# Patient Record
Sex: Male | Born: 1965
Health system: Southern US, Academic
[De-identification: ages and names within clinical notes are randomized; demographics above are authoritative.]

## PROBLEM LIST (undated history)

## (undated) ENCOUNTER — Ambulatory Visit: Payer: PRIVATE HEALTH INSURANCE

## (undated) ENCOUNTER — Telehealth

## (undated) ENCOUNTER — Telehealth: Attending: Oncology | Primary: Oncology

## (undated) ENCOUNTER — Telehealth
Attending: Pharmacist Clinician (PhC)/ Clinical Pharmacy Specialist | Primary: Pharmacist Clinician (PhC)/ Clinical Pharmacy Specialist

## (undated) ENCOUNTER — Encounter: Attending: Hematology & Oncology | Primary: Hematology & Oncology

## (undated) ENCOUNTER — Encounter

## (undated) ENCOUNTER — Encounter: Attending: Oncology | Primary: Oncology

## (undated) ENCOUNTER — Encounter: Attending: Internal Medicine | Primary: Internal Medicine

## (undated) ENCOUNTER — Encounter: Attending: Primary Care | Primary: Primary Care

## (undated) ENCOUNTER — Ambulatory Visit

## (undated) ENCOUNTER — Ambulatory Visit: Payer: PRIVATE HEALTH INSURANCE | Attending: Internal Medicine | Primary: Internal Medicine

## (undated) ENCOUNTER — Encounter: Attending: Nurse Practitioner | Primary: Nurse Practitioner

## (undated) ENCOUNTER — Telehealth: Attending: Hematology & Oncology | Primary: Hematology & Oncology

## (undated) ENCOUNTER — Ambulatory Visit: Payer: MEDICAID

## (undated) ENCOUNTER — Telehealth: Attending: Nurse Practitioner | Primary: Nurse Practitioner

## (undated) ENCOUNTER — Encounter
Attending: Pharmacist Clinician (PhC)/ Clinical Pharmacy Specialist | Primary: Pharmacist Clinician (PhC)/ Clinical Pharmacy Specialist

## (undated) ENCOUNTER — Ambulatory Visit: Payer: PRIVATE HEALTH INSURANCE | Attending: Hematology & Oncology | Primary: Hematology & Oncology

## (undated) ENCOUNTER — Encounter: Attending: Gastroenterology | Primary: Gastroenterology

## (undated) ENCOUNTER — Inpatient Hospital Stay

## (undated) ENCOUNTER — Encounter
Attending: Student in an Organized Health Care Education/Training Program | Primary: Student in an Organized Health Care Education/Training Program

## (undated) ENCOUNTER — Encounter: Attending: Family | Primary: Family

## (undated) ENCOUNTER — Telehealth: Attending: Internal Medicine | Primary: Internal Medicine

## (undated) ENCOUNTER — Ambulatory Visit: Payer: PRIVATE HEALTH INSURANCE | Attending: Ophthalmology | Primary: Ophthalmology

## (undated) ENCOUNTER — Ambulatory Visit: Payer: PRIVATE HEALTH INSURANCE | Attending: Oncology | Primary: Oncology

## (undated) ENCOUNTER — Telehealth
Attending: Student in an Organized Health Care Education/Training Program | Primary: Student in an Organized Health Care Education/Training Program

## (undated) ENCOUNTER — Ambulatory Visit
Payer: PRIVATE HEALTH INSURANCE | Attending: Pharmacist Clinician (PhC)/ Clinical Pharmacy Specialist | Primary: Pharmacist Clinician (PhC)/ Clinical Pharmacy Specialist

## (undated) ENCOUNTER — Inpatient Hospital Stay: Payer: PRIVATE HEALTH INSURANCE

## (undated) DIAGNOSIS — E079 Disorder of thyroid, unspecified: Secondary | ICD-10-CM

## (undated) DIAGNOSIS — K0889 Other specified disorders of teeth and supporting structures: Secondary | ICD-10-CM

## (undated) DIAGNOSIS — Z5111 Encounter for antineoplastic chemotherapy: Secondary | ICD-10-CM

## (undated) DIAGNOSIS — T8609 Other complications of bone marrow transplant: Secondary | ICD-10-CM

## (undated) DIAGNOSIS — I728 Aneurysm of other specified arteries: Secondary | ICD-10-CM

## (undated) DIAGNOSIS — F329 Major depressive disorder, single episode, unspecified: Secondary | ICD-10-CM

## (undated) DIAGNOSIS — R51 Headache: Secondary | ICD-10-CM

## (undated) DIAGNOSIS — C8190 Hodgkin lymphoma, unspecified, unspecified site: Secondary | ICD-10-CM

## (undated) DIAGNOSIS — E119 Type 2 diabetes mellitus without complications: Secondary | ICD-10-CM

## (undated) DIAGNOSIS — D89813 Graft-versus-host disease, unspecified: Secondary | ICD-10-CM

## (undated) DIAGNOSIS — I10 Essential (primary) hypertension: Secondary | ICD-10-CM

## (undated) DIAGNOSIS — C801 Malignant (primary) neoplasm, unspecified: Secondary | ICD-10-CM

## (undated) DIAGNOSIS — F32A Depression, unspecified: Secondary | ICD-10-CM

## (undated) HISTORY — DX: Aneurysm of other specified arteries: I72.8

## (undated) HISTORY — DX: Hodgkin lymphoma, unspecified, unspecified site: C81.90

## (undated) HISTORY — PX: TONSILLECTOMY: SUR1361

## (undated) HISTORY — DX: Other specified disorders of teeth and supporting structures: K08.89

## (undated) HISTORY — DX: Encounter for antineoplastic chemotherapy: Z51.11

## (undated) HISTORY — DX: Malignant (primary) neoplasm, unspecified: C80.1

---
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
## 2012-12-03 ENCOUNTER — Other Ambulatory Visit: Payer: Self-pay | Admitting: Endocrinology

## 2012-12-03 DIAGNOSIS — R221 Localized swelling, mass and lump, neck: Secondary | ICD-10-CM

## 2012-12-06 ENCOUNTER — Ambulatory Visit
Admission: RE | Admit: 2012-12-06 | Discharge: 2012-12-06 | Disposition: A | Discharge status: Home / Self Care | Payer: PRIVATE HEALTH INSURANCE | Source: Ambulatory Visit | Attending: Endocrinology | Admitting: Endocrinology

## 2012-12-06 DIAGNOSIS — R221 Localized swelling, mass and lump, neck: Secondary | ICD-10-CM

## 2012-12-06 MED ORDER — IOHEXOL 300 MG/ML  SOLN
75.0000 mL | Freq: Once | INTRAMUSCULAR | Status: AC | PRN
  Administered 2012-12-06: 75 mL via INTRAVENOUS

## 2012-12-22 ENCOUNTER — Encounter (INDEPENDENT_AMBULATORY_CARE_PROVIDER_SITE_OTHER): Payer: Self-pay | Admitting: General Surgery

## 2012-12-22 ENCOUNTER — Ambulatory Visit (INDEPENDENT_AMBULATORY_CARE_PROVIDER_SITE_OTHER): Payer: PRIVATE HEALTH INSURANCE | Admitting: General Surgery

## 2012-12-22 ENCOUNTER — Encounter (INDEPENDENT_AMBULATORY_CARE_PROVIDER_SITE_OTHER): Payer: Self-pay

## 2012-12-22 VITALS — BP 120/76 | HR 100 | Resp 20 | Ht 70.0 in | Wt 229.0 lb

## 2012-12-22 DIAGNOSIS — R599 Enlarged lymph nodes, unspecified: Secondary | ICD-10-CM

## 2012-12-22 DIAGNOSIS — R590 Localized enlarged lymph nodes: Secondary | ICD-10-CM | POA: Insufficient documentation

## 2012-12-22 NOTE — Progress Notes (Signed)
Patient ID: [name] [name], male   DOB: September 22, 1965, 47 y.o.   MRN: [id number]  Chief Complaint  Patient presents with  . neck mass    HPI [name] [name] is a 47 y.o. male.  The patient is a 47 year old male who was referred by Dr. Talmage Nap for evaluation of a left-sided cervical lymphadenopathy. Patient states that he noticed these since August. He does state that he has some pain. He stated he had some night sweats. He noticed some weight loss but states this was due to his metformin. He regained his weight.  The patient underwent a CT scan which shows lymphadenopathy left cervical chain. Patient also with right-sided axillary lymphadenopathy HPI  History reviewed. No pertinent past medical history.  Past Surgical History  Procedure Laterality Date  . Tonsillectomy      History reviewed. No pertinent family history.  Social History History  Substance Use Topics  . Smoking status: Former Games developer  . Smokeless tobacco: Not on file  . Alcohol Use: No    Not on File  Current Outpatient Prescriptions  Medication Sig Dispense Refill  . Azelastine-Fluticasone (DYMISTA) 137-50 MCG/ACT SUSP Place 137 mEq into the nose. Two sprays twice daily in each nare.      . fexofenadine (ALLEGRA) 180 MG tablet Take 180 mg by mouth daily.      . hydrochlorothiazide (HYDRODIURIL) 50 MG tablet Take 50 mg by mouth daily.      Marland Kitchen levothyroxine (SYNTHROID, LEVOTHROID) 200 MCG tablet Take 200 mcg by mouth daily before breakfast.      . lisinopril (PRINIVIL,ZESTRIL) 10 MG tablet Take 10 mg by mouth daily.      Marland Kitchen lovastatin (ALTOPREV) 40 MG 24 hr tablet Take 40 mg by mouth at bedtime.      . metFORMIN (GLUCOPHAGE) 500 MG tablet Take 500 mg by mouth daily.      Marland Kitchen POTASSIUM AMINOBENZOATE PO Take 10 mEq by mouth.      . sertraline (ZOLOFT) 100 MG tablet Take 100 mg by mouth daily.       No current facility-administered medications for this visit.    Review of Systems Review of Systems  Constitutional:  Negative.   HENT: Negative.   Eyes: Negative.   Respiratory: Negative.   Cardiovascular: Negative.   Gastrointestinal: Negative.   Endocrine: Negative.   Neurological: Negative.   Hematological: Positive for adenopathy.    Blood pressure 120/76, pulse 100, resp. rate 20, height 5\' 10"  (1.778 m), weight 229 lb (103.874 kg).  Physical Exam Physical Exam  Constitutional: He is oriented to person, place, and time. He appears well-developed and well-nourished.  HENT:  Head: Normocephalic and atraumatic.  Eyes: Conjunctivae and EOM are normal. Pupils are equal, round, and reactive to light.  Neck: Normal range of motion. Neck supple.    Cardiovascular: Normal rate, regular rhythm and normal heart sounds.   Pulmonary/Chest: Effort normal and breath sounds normal.  Musculoskeletal: Normal range of motion.  Lymphadenopathy:    He has cervical adenopathy.       Left cervical: Superficial cervical, deep cervical and posterior cervical adenopathy present.    He has axillary adenopathy.  Neurological: He is alert and oriented to person, place, and time.  Skin: Skin is warm and dry.    Data Reviewed CT scan results as above  Assessment    47 year old male with a left-sided cervical lymphadenopathy, supraclavicular lymphadenopathy, and right-sided axillary lymphadenopathy     Plan    1. We'll proceed to the  operating room for an excisional biopsy of a cervical/supraclavicular lymphadenopathy. 2. We discussed the possibility of complications to include but not limited to: Infection, bleeding, lymphocele formation, possible complications, and possible need for further surgery.        Marigene Ehlers., Jakerria Kingbird 12/22/2012, 4:17 PM

## 2012-12-24 ENCOUNTER — Encounter (HOSPITAL_COMMUNITY): Payer: Self-pay | Admitting: Pharmacy Technician

## 2012-12-29 ENCOUNTER — Ambulatory Visit (HOSPITAL_COMMUNITY)
Admission: RE | Admit: 2012-12-29 | Discharge: 2012-12-29 | Disposition: A | Discharge status: Home / Self Care | Payer: PRIVATE HEALTH INSURANCE | Source: Ambulatory Visit | Attending: Anesthesiology | Admitting: Anesthesiology

## 2012-12-29 ENCOUNTER — Encounter (HOSPITAL_COMMUNITY): Payer: Self-pay

## 2012-12-29 ENCOUNTER — Encounter (HOSPITAL_COMMUNITY)
Admission: RE | Admit: 2012-12-29 | Discharge: 2012-12-29 | Disposition: A | Discharge status: Home / Self Care | Payer: PRIVATE HEALTH INSURANCE | Source: Ambulatory Visit | Attending: General Surgery | Admitting: General Surgery

## 2012-12-29 DIAGNOSIS — Z01812 Encounter for preprocedural laboratory examination: Secondary | ICD-10-CM | POA: Insufficient documentation

## 2012-12-29 DIAGNOSIS — E119 Type 2 diabetes mellitus without complications: Secondary | ICD-10-CM | POA: Insufficient documentation

## 2012-12-29 DIAGNOSIS — I10 Essential (primary) hypertension: Secondary | ICD-10-CM | POA: Insufficient documentation

## 2012-12-29 DIAGNOSIS — Z01818 Encounter for other preprocedural examination: Secondary | ICD-10-CM | POA: Insufficient documentation

## 2012-12-29 HISTORY — DX: Headache: R51

## 2012-12-29 HISTORY — DX: Type 2 diabetes mellitus without complications: E11.9

## 2012-12-29 HISTORY — DX: Essential (primary) hypertension: I10

## 2012-12-29 HISTORY — DX: Disorder of thyroid, unspecified: E07.9

## 2012-12-29 HISTORY — DX: Depression, unspecified: F32.A

## 2012-12-29 HISTORY — DX: Major depressive disorder, single episode, unspecified: F32.9

## 2012-12-29 LAB — CBC
Hemoglobin: 13.7 g/dL (ref 13.0–17.0)
MCH: 30.9 pg (ref 26.0–34.0)
MCHC: 34.8 g/dL (ref 30.0–36.0)
MCV: 88.9 fL (ref 78.0–100.0)
Platelets: 391 10*3/uL (ref 150–400)
RBC: 4.43 MIL/uL (ref 4.22–5.81)
RDW: 13.9 % (ref 11.5–15.5)
WBC: 9.6 10*3/uL (ref 4.0–10.5)

## 2012-12-29 LAB — BASIC METABOLIC PANEL
CO2: 28 mEq/L (ref 19–32)
Calcium: 9.4 mg/dL (ref 8.4–10.5)
Chloride: 99 mEq/L (ref 96–112)
Creatinine, Ser: 0.9 mg/dL (ref 0.50–1.35)
GFR calc Af Amer: >90 mL/min (ref >90)
GFR calc non Af Amer: >90 mL/min (ref >90)
Glucose, Bld: 121 mg/dL — ABNORMAL HIGH (ref 70–99)
Sodium: 136 mEq/L (ref 135–145)

## 2012-12-29 NOTE — Pre-Procedure Instructions (Signed)
XERXES AGRUSA  12/29/2012    Your procedure is scheduled on:  Friday, December 31, 2012 @ 1:30 PM  Report to [facility] Short Stay (use Main Entrance "A'') at 11:30 AM.  Call this number if you have problems the morning of surgery: [phone]   Remember:   Do not eat food or drink liquids after midnight.   Take these medicines the morning of surgery with A SIP OF WATER: fexofenadine (ALLEGRA) 180 MG tablet, levothyroxine (SYNTHROID, LEVOTHROID) 200 MCG tablet, sertraline (ZOLOFT) 100 MG tablet, Azelastine-Fluticasone (DYMISTA) 137-50 MCG/ACT SUSP Stop taking Aspirin, vitamins and herbal medications. Do not take any NSAIDs ie: Ibuprofen, Advil, Naproxen or any medication containing Aspirin.   Do not wear jewelry, make-up or nail polish.  Do not wear lotions, powders, or perfumes. You may wear deodorant.  Do not shave 48 hours prior to surgery. Men may shave face and neck.  Do not bring valuables to the hospital.  [facility] is not responsible for any belongings or valuables.               Contacts, dentures or bridgework may not be worn into surgery.  Leave suitcase in the car. After surgery it may be brought to your room.  For patients admitted to the hospital, discharge time is determined by your treatment team.               Patients discharged the day of surgery will not be allowed to drive home.  Name and phone number of your driver:   Special Instructions: Shower using CHG 2 nights before surgery and the night before surgery.  If you shower the day of surgery use CHG.  Use special wash - you have one bottle of CHG for all showers.  You should use approximately 1/3 of the bottle for each shower.   Please read over the following fact sheets that you were given: Pain Booklet, Coughing and Deep Breathing and Surgical Site Infection Prevention

## 2012-12-30 MED ORDER — CHLORHEXIDINE GLUCONATE 4 % EX LIQD: 1.0000 "application " | Freq: Once | CUTANEOUS | Status: DC

## 2012-12-30 MED ORDER — CEFAZOLIN SODIUM-DEXTROSE 2-3 GM-% IV SOLR
2.0000 g | INTRAVENOUS | Status: AC
  Administered 2012-12-31: 2 g via INTRAVENOUS

## 2012-12-30 NOTE — Progress Notes (Signed)
Anesthesia chart review:  Patient is a 47 year old male scheduled for excisional biopsy of cervical lymph nodes on 12/31/12 by Dr. Derrell Lolling.  History includes obesity, former smoker, HTN, DM2, depression, headaches, tonsillectomy, hypothyroidism. Endocrinologist is Dr. Talmage Nap.  EKG showed NSR, low voltage QRS.   Preoperative CXR and labs noted.  Preoperative diagnostics appear acceptable for OR.  Velna Ochs [facility] Short Stay Center/Anesthesiology Phone [phone] 12/30/2012 9:32 AM

## 2012-12-31 ENCOUNTER — Ambulatory Visit (HOSPITAL_COMMUNITY): Payer: PRIVATE HEALTH INSURANCE | Admitting: Certified Registered"

## 2012-12-31 ENCOUNTER — Encounter (HOSPITAL_COMMUNITY): Payer: Self-pay | Admitting: Certified Registered"

## 2012-12-31 ENCOUNTER — Encounter (HOSPITAL_COMMUNITY): Payer: PRIVATE HEALTH INSURANCE | Admitting: Vascular Surgery

## 2012-12-31 ENCOUNTER — Encounter (HOSPITAL_COMMUNITY)
Admission: RE | Disposition: A | Discharge status: Home / Self Care | Payer: Self-pay | Source: Ambulatory Visit | Attending: General Surgery

## 2012-12-31 ENCOUNTER — Ambulatory Visit (HOSPITAL_COMMUNITY)
Admission: RE | Admit: 2012-12-31 | Discharge: 2012-12-31 | Disposition: A | Discharge status: Home / Self Care | Payer: PRIVATE HEALTH INSURANCE | Source: Ambulatory Visit | Attending: General Surgery | Admitting: General Surgery

## 2012-12-31 DIAGNOSIS — C8581 Other specified types of non-Hodgkin lymphoma, lymph nodes of head, face, and neck: Secondary | ICD-10-CM

## 2012-12-31 DIAGNOSIS — E119 Type 2 diabetes mellitus without complications: Secondary | ICD-10-CM | POA: Insufficient documentation

## 2012-12-31 DIAGNOSIS — I10 Essential (primary) hypertension: Secondary | ICD-10-CM | POA: Insufficient documentation

## 2012-12-31 DIAGNOSIS — C8121 Mixed cellularity classical Hodgkin lymphoma, lymph nodes of head, face, and neck: Secondary | ICD-10-CM | POA: Insufficient documentation

## 2012-12-31 DIAGNOSIS — Z87891 Personal history of nicotine dependence: Secondary | ICD-10-CM | POA: Insufficient documentation

## 2012-12-31 HISTORY — PX: MASS EXCISION: SHX2000

## 2012-12-31 LAB — GLUCOSE, CAPILLARY
Glucose-Capillary: 107 mg/dL — ABNORMAL HIGH (ref 70–99)
Glucose-Capillary: 98 mg/dL (ref 70–99)

## 2012-12-31 SURGERY — EXCISION MASS
Anesthesia: General | Site: Neck | Laterality: Left

## 2012-12-31 MED ORDER — LIDOCAINE HCL (CARDIAC) 20 MG/ML IV SOLN
INTRAVENOUS | Status: DC | PRN
  Administered 2012-12-31: 100 mg via INTRAVENOUS

## 2012-12-31 MED ORDER — BUPIVACAINE-EPINEPHRINE (PF) 0.25% -1:200000 IJ SOLN
INTRAMUSCULAR | Status: AC
  Filled 2012-12-31: qty 30

## 2012-12-31 MED ORDER — HYDROMORPHONE HCL PF 1 MG/ML IJ SOLN: 0.2500 mg | INTRAMUSCULAR | Status: DC | PRN

## 2012-12-31 MED ORDER — OXYCODONE HCL 5 MG PO TABS: 5.0000 mg | ORAL_TABLET | Freq: Once | ORAL | Status: DC | PRN

## 2012-12-31 MED ORDER — OXYCODONE HCL 5 MG/5ML PO SOLN: 5.0000 mg | Freq: Once | ORAL | Status: DC | PRN

## 2012-12-31 MED ORDER — ONDANSETRON HCL 4 MG/2ML IJ SOLN: 4.0000 mg | Freq: Four times a day (QID) | INTRAMUSCULAR | Status: DC | PRN

## 2012-12-31 MED ORDER — OXYCODONE-ACETAMINOPHEN 10-325 MG PO TABS: 1.0000 | ORAL_TABLET | ORAL | Status: DC | PRN

## 2012-12-31 MED ORDER — 0.9 % SODIUM CHLORIDE (POUR BTL) OPTIME
TOPICAL | Status: DC | PRN
  Administered 2012-12-31: 1000 mL

## 2012-12-31 MED ORDER — CEFAZOLIN SODIUM-DEXTROSE 2-3 GM-% IV SOLR
INTRAVENOUS | Status: AC
  Filled 2012-12-31: qty 50

## 2012-12-31 MED ORDER — SODIUM CHLORIDE 0.9 % IV SOLN: 250.0000 mL | INTRAVENOUS | Status: DC | PRN

## 2012-12-31 MED ORDER — SODIUM CHLORIDE 0.9 % IJ SOLN: 3.0000 mL | Freq: Two times a day (BID) | INTRAMUSCULAR | Status: DC

## 2012-12-31 MED ORDER — LACTATED RINGERS IV SOLN
INTRAVENOUS | Status: DC
  Administered 2012-12-31: 12:00:00 via INTRAVENOUS

## 2012-12-31 MED ORDER — ACETAMINOPHEN 325 MG PO TABS: 650.0000 mg | ORAL_TABLET | ORAL | Status: DC | PRN

## 2012-12-31 MED ORDER — ONDANSETRON HCL 4 MG/2ML IJ SOLN
INTRAMUSCULAR | Status: DC | PRN
  Administered 2012-12-31: 4 mg via INTRAVENOUS

## 2012-12-31 MED ORDER — SODIUM CHLORIDE 0.9 % IJ SOLN: 3.0000 mL | INTRAMUSCULAR | Status: DC | PRN

## 2012-12-31 MED ORDER — MIDAZOLAM HCL 5 MG/5ML IJ SOLN
INTRAMUSCULAR | Status: DC | PRN
  Administered 2012-12-31: 2 mg via INTRAVENOUS

## 2012-12-31 MED ORDER — METOCLOPRAMIDE HCL 5 MG/ML IJ SOLN: 10.0000 mg | Freq: Once | INTRAMUSCULAR | Status: DC | PRN

## 2012-12-31 MED ORDER — OXYCODONE HCL 5 MG PO TABS: 5.0000 mg | ORAL_TABLET | ORAL | Status: DC | PRN

## 2012-12-31 MED ORDER — FENTANYL CITRATE 0.05 MG/ML IJ SOLN
INTRAMUSCULAR | Status: DC | PRN
  Administered 2012-12-31: 50 ug via INTRAVENOUS

## 2012-12-31 MED ORDER — PROPOFOL 10 MG/ML IV BOLUS
INTRAVENOUS | Status: DC | PRN
  Administered 2012-12-31: 250 mg via INTRAVENOUS

## 2012-12-31 MED ORDER — BUPIVACAINE-EPINEPHRINE 0.25% -1:200000 IJ SOLN
INTRAMUSCULAR | Status: DC | PRN
  Administered 2012-12-31: 7 mL

## 2012-12-31 MED ORDER — ACETAMINOPHEN 650 MG RE SUPP: 650.0000 mg | RECTAL | Status: DC | PRN

## 2012-12-31 SURGICAL SUPPLY — 49 items
APPLIER CLIP 9.375 MED OPEN (MISCELLANEOUS) ×2
BENZOIN TINCTURE PRP APPL 2/3 (GAUZE/BANDAGES/DRESSINGS) IMPLANT
BLADE SURG 10 STRL SS (BLADE) ×2 IMPLANT
BLADE SURG 15 STRL LF DISP TIS (BLADE) ×1 IMPLANT
BLADE SURG 15 STRL SS (BLADE) ×1
BLADE SURG ROTATE 9660 (MISCELLANEOUS) ×2 IMPLANT
CANISTER SUCTION 2500CC (MISCELLANEOUS) ×2 IMPLANT
CHLORAPREP W/TINT 26ML (MISCELLANEOUS) ×2 IMPLANT
CLIP APPLIE 9.375 MED OPEN (MISCELLANEOUS) ×1 IMPLANT
CLIP TI WIDE RED SMALL 6 (CLIP) ×2 IMPLANT
CONT SPEC 4OZ CLIKSEAL STRL BL (MISCELLANEOUS) ×2 IMPLANT
COVER SURGICAL LIGHT HANDLE (MISCELLANEOUS) ×2 IMPLANT
DERMABOND ADVANCED (GAUZE/BANDAGES/DRESSINGS) ×1
DERMABOND ADVANCED .7 DNX12 (GAUZE/BANDAGES/DRESSINGS) ×1 IMPLANT
DRAPE ORTHO SPLIT 77X108 STRL (DRAPES)
DRAPE PED LAPAROTOMY (DRAPES) ×2 IMPLANT
DRAPE SURG ORHT 6 SPLT 77X108 (DRAPES) IMPLANT
ELECT CAUTERY BLADE 6.4 (BLADE) ×2 IMPLANT
ELECT REM PT RETURN 9FT ADLT (ELECTROSURGICAL) ×2
ELECTRODE REM PT RTRN 9FT ADLT (ELECTROSURGICAL) ×1 IMPLANT
GLOVE BIO SURGEON STRL SZ7.5 (GLOVE) ×4 IMPLANT
GLOVE BIOGEL PI IND STRL 7.0 (GLOVE) ×1 IMPLANT
GLOVE BIOGEL PI IND STRL 7.5 (GLOVE) ×1 IMPLANT
GLOVE BIOGEL PI IND STRL 8 (GLOVE) IMPLANT
GLOVE BIOGEL PI INDICATOR 7.0 (GLOVE) ×1
GLOVE BIOGEL PI INDICATOR 7.5 (GLOVE) ×1
GLOVE BIOGEL PI INDICATOR 8 (GLOVE)
GOWN STRL NON-REIN LRG LVL3 (GOWN DISPOSABLE) ×2 IMPLANT
GOWN STRL REIN XL XLG (GOWN DISPOSABLE) ×2 IMPLANT
KIT BASIN OR (CUSTOM PROCEDURE TRAY) ×2 IMPLANT
KIT ROOM TURNOVER OR (KITS) ×2 IMPLANT
NEEDLE HYPO 25GX1X1/2 BEV (NEEDLE) ×2 IMPLANT
NS IRRIG 1000ML POUR BTL (IV SOLUTION) ×2 IMPLANT
PACK SURGICAL SETUP 50X90 (CUSTOM PROCEDURE TRAY) ×2 IMPLANT
PAD ARMBOARD 7.5X6 YLW CONV (MISCELLANEOUS) ×2 IMPLANT
PENCIL BUTTON HOLSTER BLD 10FT (ELECTRODE) ×2 IMPLANT
SPECIMEN JAR SMALL (MISCELLANEOUS) IMPLANT
SPONGE GAUZE 4X4 12PLY (GAUZE/BANDAGES/DRESSINGS) IMPLANT
SPONGE LAP 18X18 X RAY DECT (DISPOSABLE) ×2 IMPLANT
STRIP CLOSURE SKIN 1/2X4 (GAUZE/BANDAGES/DRESSINGS) IMPLANT
SUT MNCRL AB 4-0 PS2 18 (SUTURE) ×2 IMPLANT
SUT VIC AB 3-0 SH 18 (SUTURE) ×2 IMPLANT
SYR BULB 3OZ (MISCELLANEOUS) ×2 IMPLANT
SYR CONTROL 10ML LL (SYRINGE) ×2 IMPLANT
TOWEL OR 17X24 6PK STRL BLUE (TOWEL DISPOSABLE) ×2 IMPLANT
TOWEL OR 17X26 10 PK STRL BLUE (TOWEL DISPOSABLE) ×2 IMPLANT
TUBE CONNECTING 12X1/4 (SUCTIONS) ×2 IMPLANT
UNDERPAD 30X30 INCONTINENT (UNDERPADS AND DIAPERS) IMPLANT
YANKAUER SUCT BULB TIP NO VENT (SUCTIONS) ×2 IMPLANT

## 2012-12-31 NOTE — Anesthesia Procedure Notes (Signed)
Procedure Name: LMA Insertion Date/Time: 12/31/2012 12:58 PM Performed by: Arlice Colt B Pre-anesthesia Checklist: Patient identified, Emergency Drugs available, Suction available, Patient being monitored and Timeout performed Patient Re-evaluated:Patient Re-evaluated prior to inductionOxygen Delivery Method: Circle system utilized Preoxygenation: Pre-oxygenation with 100% oxygen Intubation Type: IV induction LMA: LMA inserted LMA Size: 4.0 Number of attempts: 1 Placement Confirmation: positive ETCO2 and breath sounds checked- equal and bilateral Tube secured with: Tape Dental Injury: Teeth and Oropharynx as per pre-operative assessment

## 2012-12-31 NOTE — Anesthesia Postprocedure Evaluation (Signed)
Anesthesia Post Note  Patient: [name] [name]  Procedure(s) Performed: Procedure(s) (LRB): CERVICAL LYMPH NODE BIOPSY (Left)  Anesthesia type: General  Patient location: PACU  Post pain: Pain level controlled  Post assessment: Patient's Cardiovascular Status Stable  Last Vitals:  Filed Vitals:   12/31/12 1345  BP: 110/73  Pulse: 89  Temp: 36.7 C  Resp: 12    Post vital signs: Reviewed and stable  Level of consciousness: alert  Complications: No apparent anesthesia complications

## 2012-12-31 NOTE — Anesthesia Preprocedure Evaluation (Signed)
Anesthesia Evaluation  Patient identified by MRN, date of birth, ID band Patient awake    Reviewed: Allergy & Precautions, H&P , NPO status , Patient's Chart, lab work & pertinent test results, reviewed documented beta blocker date and time   Airway Mallampati: II TM Distance: >3 FB Neck ROM: full    Dental   Pulmonary former smoker,  breath sounds clear to auscultation        Cardiovascular hypertension, On Medications Rhythm:regular     Neuro/Psych  Headaches, PSYCHIATRIC DISORDERS    GI/Hepatic negative GI ROS, Neg liver ROS,   Endo/Other  diabetes, Oral Hypoglycemic AgentsMorbid obesity  Renal/GU negative Renal ROS  negative genitourinary   Musculoskeletal   Abdominal   Peds  Hematology negative hematology ROS (+) Blood dyscrasia, ,   Anesthesia Other Findings See surgeon's H&P   Reproductive/Obstetrics negative OB ROS                           Anesthesia Physical Anesthesia Plan  ASA: III  Anesthesia Plan: General   Post-op Pain Management:    Induction: Intravenous  Airway Management Planned: Oral ETT  Additional Equipment:   Intra-op Plan:   Post-operative Plan: Extubation in OR  Informed Consent: I have reviewed the patients History and Physical, chart, labs and discussed the procedure including the risks, benefits and alternatives for the proposed anesthesia with the patient or authorized representative who has indicated his/her understanding and acceptance.   Dental Advisory Given  Plan Discussed with: CRNA and Surgeon  Anesthesia Plan Comments:         Anesthesia Quick Evaluation

## 2012-12-31 NOTE — Transfer of Care (Signed)
Immediate Anesthesia Transfer of Care Note  Patient: [name] [name]  Procedure(s) Performed: Procedure(s): CERVICAL LYMPH NODE BIOPSY (Left)  Patient Location: PACU  Anesthesia Type:General  Level of Consciousness: awake, alert  and oriented  Airway & Oxygen Therapy: Patient Spontanous Breathing and Patient connected to nasal cannula oxygen  Post-op Assessment: Report given to PACU RN and Post -op Vital signs reviewed and stable  Post vital signs: Reviewed and stable  Complications: No apparent anesthesia complications

## 2012-12-31 NOTE — Op Note (Signed)
12/31/2012  1:30 PM  PATIENT:  [name] [name]  47 y.o. male  PRE-OPERATIVE DIAGNOSIS:  neck mass  POST-OPERATIVE DIAGNOSIS:  neck mass  PROCEDURE:  Procedure(s): CERVICAL LYMPH NODE BIOPSY (Left)  SURGEON:  Surgeon(s) and Role:    * Axel Filler, MD - Primary  PHYSICIAN ASSISTANT:   ASSISTANTS: none   ANESTHESIA:   general  EBL:   10cc  BLOOD ADMINISTERED:none  DRAINS: none   LOCAL MEDICATIONS USED:  MARCAINE     SPECIMEN:  Source of Specimen:  Left clavicular lymph node  DISPOSITION OF SPECIMEN:  PATHOLOGY  COUNTS:  YES  TOURNIQUET:  * No tourniquets in log *  DICTATION: .Dragon Dictation The patient was consented and taken back to the operating room. Patient was placed in supine position with bilateral SCDs were placed. Patient was then prepped and draped in the usual sterile fashion. A timeout was called and all facts were verified.  A 2 cm incision was made just to the left supraclavicular area and a palpable lymph node. Bovie cautery was used to maintain hemostasis and dissection taken down through the platysma. The lymph node was easily seen just deep to the platysma. Medium-size clips were used to clip the surrounding feeding vasculature. Once the lymph node was removed it was sent to pathology. The wound was irrigated out with sterile saline. It was hemostatic. At this time the platysma was reapproximated using a 3-0 Vicryl in interrupted fashion. Skin was then reapproximated using 4 Monocryl subcuticular fashion. The wound was then dressed with Dermabond. The patient was awakened from anesthesia and taken to recovery in stable condition.  PLAN OF CARE: Discharge to home after PACU  PATIENT DISPOSITION:  PACU - hemodynamically stable.   Delay start of Pharmacological VTE agent (>24hrs) due to surgical blood loss or risk of bleeding: not applicable

## 2012-12-31 NOTE — OR Nursing (Signed)
Off monitor pending phase 2

## 2013-01-04 ENCOUNTER — Telehealth (INDEPENDENT_AMBULATORY_CARE_PROVIDER_SITE_OTHER): Payer: Self-pay

## 2013-01-04 ENCOUNTER — Encounter (HOSPITAL_COMMUNITY): Payer: Self-pay | Admitting: General Surgery

## 2013-01-04 NOTE — Telephone Encounter (Signed)
Rec'd call from Dr. 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  Dr. Berneice Heinrich will send slides to [facility] for PCR, T-Cell Recptor arrangement which may take several weeks to come back. At this time, patient is unaware of results.  Dr. Derrell Lolling has been notified of results and will contact patient to speak with him directly regarding his pathology results.

## 2013-01-07 ENCOUNTER — Telehealth (INDEPENDENT_AMBULATORY_CARE_PROVIDER_SITE_OTHER): Payer: Self-pay | Admitting: General Surgery

## 2013-01-07 NOTE — Telephone Encounter (Signed)
I called and left a message for Mr. Shadle in regards to his Pathology.

## 2013-01-07 NOTE — Telephone Encounter (Signed)
Pathology results were read to the patient.  His report will be shown to our Urgent Office physician today and we will call him with a more detailed description of the results.

## 2013-01-10 ENCOUNTER — Other Ambulatory Visit (INDEPENDENT_AMBULATORY_CARE_PROVIDER_SITE_OTHER): Payer: Self-pay

## 2013-01-10 ENCOUNTER — Telehealth: Payer: Self-pay | Admitting: Internal Medicine

## 2013-01-10 ENCOUNTER — Telehealth (INDEPENDENT_AMBULATORY_CARE_PROVIDER_SITE_OTHER): Payer: Self-pay | Admitting: General Surgery

## 2013-01-10 DIAGNOSIS — C8590 Non-Hodgkin lymphoma, unspecified, unspecified site: Secondary | ICD-10-CM

## 2013-01-10 NOTE — Telephone Encounter (Signed)
C/D 01/10/13 for appt. 01/14/13

## 2013-01-10 NOTE — Telephone Encounter (Signed)
D/w pt his pathology results from his [facility].  We will set up H/O f/u in regards for further therapy that would be needed, and he understood. All questions were answered to his satisfaction.

## 2013-01-10 NOTE — Telephone Encounter (Signed)
S/W PT AND GVE NP APPT 01/02 @ 10:30 W/DR. CHISM REFERRING DR. Axel Filler DX-LYMPHOMA WELCOME PACKET MAILED.

## 2013-01-13 DIAGNOSIS — C801 Malignant (primary) neoplasm, unspecified: Secondary | ICD-10-CM

## 2013-01-13 DIAGNOSIS — I728 Aneurysm of other specified arteries: Secondary | ICD-10-CM

## 2013-01-13 DIAGNOSIS — C8190 Hodgkin lymphoma, unspecified, unspecified site: Secondary | ICD-10-CM

## 2013-01-13 HISTORY — DX: Aneurysm of other specified arteries: I72.8

## 2013-01-13 HISTORY — DX: Malignant (primary) neoplasm, unspecified: C80.1

## 2013-01-13 HISTORY — DX: Hodgkin lymphoma, unspecified, unspecified site: C81.90

## 2013-01-14 ENCOUNTER — Ambulatory Visit (HOSPITAL_BASED_OUTPATIENT_CLINIC_OR_DEPARTMENT_OTHER): Payer: PRIVATE HEALTH INSURANCE | Admitting: Internal Medicine

## 2013-01-14 ENCOUNTER — Encounter: Payer: Self-pay | Admitting: Internal Medicine

## 2013-01-14 ENCOUNTER — Telehealth: Payer: Self-pay | Admitting: Internal Medicine

## 2013-01-14 ENCOUNTER — Other Ambulatory Visit: Payer: Self-pay | Admitting: Internal Medicine

## 2013-01-14 ENCOUNTER — Ambulatory Visit (HOSPITAL_BASED_OUTPATIENT_CLINIC_OR_DEPARTMENT_OTHER): Payer: PRIVATE HEALTH INSURANCE

## 2013-01-14 ENCOUNTER — Other Ambulatory Visit (HOSPITAL_BASED_OUTPATIENT_CLINIC_OR_DEPARTMENT_OTHER): Payer: PRIVATE HEALTH INSURANCE

## 2013-01-14 VITALS — BP 114/87 | HR 84 | Temp 98.0°F | Resp 20 | Ht 67.0 in | Wt 223.2 lb

## 2013-01-14 DIAGNOSIS — E119 Type 2 diabetes mellitus without complications: Secondary | ICD-10-CM

## 2013-01-14 DIAGNOSIS — C8120 Mixed cellularity classical Hodgkin lymphoma, unspecified site: Secondary | ICD-10-CM | POA: Insufficient documentation

## 2013-01-14 DIAGNOSIS — E039 Hypothyroidism, unspecified: Secondary | ICD-10-CM

## 2013-01-14 DIAGNOSIS — I10 Essential (primary) hypertension: Secondary | ICD-10-CM

## 2013-01-14 DIAGNOSIS — E669 Obesity, unspecified: Secondary | ICD-10-CM

## 2013-01-14 DIAGNOSIS — E785 Hyperlipidemia, unspecified: Secondary | ICD-10-CM

## 2013-01-14 DIAGNOSIS — C8448 Peripheral T-cell lymphoma, not classified, lymph nodes of multiple sites: Secondary | ICD-10-CM

## 2013-01-14 LAB — CBC WITH DIFFERENTIAL/PLATELET
BASO%: 0.6 % (ref 0.0–2.0)
Basophils Absolute: 0.1 10*3/uL (ref 0.0–0.1)
EOS ABS: 0.2 10*3/uL (ref 0.0–0.5)
EOS%: 2.7 % (ref 0.0–7.0)
HCT: 41.2 % (ref 38.4–49.9)
HGB: 14.1 g/dL (ref 13.0–17.1)
LYMPH%: 26.9 % (ref 14.0–49.0)
MCH: 30.4 pg (ref 27.2–33.4)
MCHC: 34.3 g/dL (ref 32.0–36.0)
MCV: 88.7 fL (ref 79.3–98.0)
MONO#: 0.9 10*3/uL (ref 0.1–0.9)
MONO%: 10.2 % (ref 0.0–14.0)
NEUT%: 59.6 % (ref 39.0–75.0)
NEUTROS ABS: 5 10*3/uL (ref 1.5–6.5)
Platelets: 456 10*3/uL — ABNORMAL HIGH (ref 140–400)
RBC: 4.64 10*6/uL (ref 4.20–5.82)
RDW: 14.4 % (ref 11.0–14.6)
WBC: 8.4 10*3/uL (ref 4.0–10.3)
lymph#: 2.3 10*3/uL (ref 0.9–3.3)

## 2013-01-14 LAB — COMPREHENSIVE METABOLIC PANEL (CC13)
ALK PHOS: 89 U/L (ref 40–150)
ALT: 20 U/L (ref 0–55)
AST: 28 U/L (ref 5–34)
Albumin: 3.2 g/dL — ABNORMAL LOW (ref 3.5–5.0)
Anion Gap: 10 mEq/L (ref 3–11)
BUN: 17.4 mg/dL (ref 7.0–26.0)
CO2: 27 mEq/L (ref 22–29)
Calcium: 9.9 mg/dL (ref 8.4–10.4)
Chloride: 100 mEq/L (ref 98–109)
Creatinine: 1 mg/dL (ref 0.7–1.3)
GLUCOSE: 116 mg/dL (ref 70–140)
Potassium: 5 mEq/L (ref 3.5–5.1)
SODIUM: 137 meq/L (ref 136–145)
TOTAL PROTEIN: 9.1 g/dL — AB (ref 6.4–8.3)
Total Bilirubin: 0.27 mg/dL (ref 0.20–1.20)

## 2013-01-14 LAB — URIC ACID (CC13): Uric Acid, Serum: 7.7 mg/dl — ABNORMAL HIGH (ref 2.6–7.4)

## 2013-01-14 LAB — LACTATE DEHYDROGENASE (CC13): LDH: 285 U/L — AB (ref 125–245)

## 2013-01-14 NOTE — Progress Notes (Signed)
[facility] [phone]   [phone]  NEW PATIENT EVALUATION   Name: [name] [name] Date: 01/14/2013 MRN: [id number] DOB: 1965/08/29  PCP: Pcp Not In System   REFERRING PHYSICIAN: [name], MD  REASON FOR REFERRAL: T-cell Lymphoma    HISTORY OF PRESENT ILLNESS:[name] [name] is a 48 y.o. male who is being referred by Dr. Rosendo Gros for further evaluation for his newly diagnosed T-cell lymphoma.  His flow cytometry is still pending. He has a history of DM2 and hypertension and reports that he first noticed in the end of August a lump at the base of his left neck.  He states that he was seen by Endocrinology at the end of August for his thyroid.  He was referred by Dr. Chalmers Cater for evaluation of his left-sided cervical lymphadenopathy.  He reports mild discomfort.  He has had some night sweats that are described as mild with the sheets occasionally wet.  He denies subjective fevers.  He denies any sicknesses or viral infections or recent travel.  He underwent a CT scan of the neck that showed lymphadenopathy left cervical chain with right-sided axillary lymphadenopathy. He does report that prior to evaluation by Dr. Rosendo Gros and subquent excisional biopsy of cervical/supraclavicular lymphadenopathy on 12/22/2012 he had increased swell underneath his right armpit.  This was associated with right hand tingling and pain.  Pathology from tissue-flow cytometry demonstrated predominate CD4 T-cell population.    Due to his prior employment, he traveled to [place] from 2008 and returned 2010.  He has also lived in [place] in [place].  He works in [employer].  He stayed in [place] in the early 2010 for four years.  He denies HIV risk factors and reports being in a monogamous relationship with condom use with a girlfriend.  He was checked for HIV in 2010 and it was reportedly negative.  He divorced about 11 years.  He remains active with a stable weight.     PAST MEDICAL HISTORY:  has a past medical history of Hypertension; Diabetes mellitus without complication; Depression; Headache(784.0); and Thyroid disease.     PAST SURGICAL HISTORY: Past Surgical History  Procedure Laterality Date  . Tonsillectomy    . Mass excision Left 12/31/2012    Procedure: CERVICAL LYMPH NODE BIOPSY;  Surgeon: [name], MD;  Location: [facility];  Service: General;  Laterality: Left;     CURRENT MEDICATIONS: has a current medication list which includes the following prescription(s): azelastine-fluticasone, fexofenadine, hydrochlorothiazide, levothyroxine, lisinopril, lovastatin, metformin, oxycodone-acetaminophen, potassium chloride, and sertraline.   ALLERGIES: Review of patient's allergies indicates no known allergies.   SOCIAL HISTORY:  reports that he quit smoking about 7 months ago. His smoking use included Cigarettes. He smoked 0.00 packs per day. He has never used smokeless tobacco. He reports that he does not drink alcohol or use illicit drugs.   FAMILY HISTORY: family history includes Aneurysm in his mother; Cancer in his brother and father; Diabetes in his father; Heart disease in his other; Obesity in his mother.   LABORATORY DATA:  Results for orders placed in visit on 01/14/13 (from the past 48 hour(s))  CBC WITH DIFFERENTIAL     Status: Abnormal   Collection Time    01/14/13 10:47 AM      Result Value Range   WBC 8.4  4.0 - 10.3 10e3/uL   NEUT# 5.0  1.5 - 6.5 10e3/uL   HGB 14.1  13.0 - 17.1 g/dL   HCT 41.2  38.4 - 49.9 %   Platelets 456 (*) 140 - 400 10e3/uL   MCV 88.7  79.3 - 98.0 fL   MCH 30.4  27.2 - 33.4 pg   MCHC 34.3  32.0 - 36.0 g/dL   RBC 4.64  4.20 - 5.82 10e6/uL   RDW 14.4  11.0 - 14.6 %   lymph# 2.3  0.9 - 3.3 10e3/uL   MONO# 0.9  0.1 - 0.9 10e3/uL   Eosinophils Absolute 0.2  0.0 - 0.5 10e3/uL   Basophils Absolute 0.1  0.0 - 0.1 10e3/uL   NEUT% 59.6  39.0 - 75.0 %   LYMPH% 26.9  14.0 - 49.0 %   MONO% 10.2  0.0 -  14.0 %   EOS% 2.7  0.0 - 7.0 %   BASO% 0.6  0.0 - 2.0 %  COMPREHENSIVE METABOLIC PANEL (LA45)     Status: Abnormal   Collection Time    01/14/13 10:47 AM      Result Value Range   Sodium 137  136 - 145 mEq/L   Potassium 5.0  3.5 - 5.1 mEq/L   Chloride 100  98 - 109 mEq/L   CO2 27  22 - 29 mEq/L   Glucose 116  70 - 140 mg/dl   BUN 17.4  7.0 - 26.0 mg/dL   Creatinine 1.0  0.7 - 1.3 mg/dL   Total Bilirubin 0.27  0.20 - 1.20 mg/dL   Alkaline Phosphatase 89  40 - 150 U/L   AST 28  5 - 34 U/L   ALT 20  0 - 55 U/L   Total Protein 9.1 (*) 6.4 - 8.3 g/dL   Albumin 3.2 (*) 3.5 - 5.0 g/dL   Calcium 9.9  8.4 - 10.4 mg/dL   Anion Gap 10  3 - 11 mEq/L  LACTATE DEHYDROGENASE (CC13)     Status: Abnormal   Collection Time    01/14/13 10:47 AM      Result Value Range   LDH 285 (*) 125 - 245 U/L  URIC ACID (CC13)     Status: Abnormal   Collection Time    01/14/13 10:47 AM      Result Value Range   Uric Acid, Serum 7.7 (*) 2.6 - 7.4 mg/dl      RADIOGRAPHY: CT of Neck with constrast 12/06/2012 CT NECK WITH CONTRAST  TECHNIQUE:  Multidetector CT imaging of the neck was performed using the  standard protocol following the bolus administration of intravenous  contrast.  CONTRAST: 63m OMNIPAQUE IOHEXOL 300 MG/ML SOLN  COMPARISON: None.  FINDINGS:  Limited imaging of the brain is unremarkable. No focal mucosal or  submucosal lesions are present. The vocal cords are midline and  symmetric. A calcified nodule within the isthmus of the thyroid  measures 9 mm. The thyroid is mildly heterogeneous without other  focal lesions.  Multiple enlarged left-sided cervical lymph nodes are present. The  largest left level 2 lymph node measures 3.3 by 1.3 cm on the  sagittal images. More significantly enlarged left level 3 and level  4 lymph nodes measure up to 3.3 x 2.4 cm. Axillary lymph nodes are  more prominent right than left. The largest right axillary lymph  node measures 2.4 x 4.6 cm on the  axial images. A 2.1 x 1.8 cm right  paratracheal lymph node is present. Other smaller superior  mediastinal lymph nodes are present. There smaller nodes scattered  throughout the neck bilaterally. The left axilla is less completely  imaged than on  the right.  The lung apices are clear. The bone windows are unremarkable.  IMPRESSION:  1. Multiple enlarged cervical lymph nodes, most prominently on the  left. These measure up to 3.3 x 2.4 cm.  2. Enlarged right axillary lymph nodes measuring up to 2.4 x 4.6 cm.  3. Enlarged mediastinal lymph nodes including a right paratracheal  node measuring 2.1 x 1.8 cm.  4. No primary mucosal lesion. The findings are most compatible with  lymphoma.  5. Heterogeneous thyroid with a in 9 mm isthmus lesion. Consider  further evaluation with thyroid ultrasound. If patient is clinically  hyperthyroid, consider nuclear medicine thyroid uptake and scan.  Dg Chest 2 View  12/29/2012   CLINICAL DATA:  Hypertension, diabetes, and an excisional neck biopsy  EXAM: CHEST  2 VIEW  COMPARISON:  None.  FINDINGS: The heart size and mediastinal contours are within normal limits. Both lungs are clear. The visualized skeletal structures are unremarkable.  IMPRESSION: No active cardiopulmonary disease.   Electronically Signed   By: Kathreen Devoid   On: 12/29/2012 11:17    PATHOLOGY: Tissue-Flow Cytometry - PREDOMINATE CD4 T-CELL POPULATION, SEE COMMENT. Diagnosis Comment: Lymphocytes consist predominately of T-cells (90%) with the majority (83%) of those being CD4 positive. There is no aberrant phenotype. No monoclonal B-cell population is seen. See tissue specimen ([id number]) for further details and correlation. Vicente Males MD Pathologist, Electronic Signature (Case signed 01/05/2013) GROSS AND MICROSCOPIC INFORMATION Source Tissue-Flow Cytometry Microscopic Gated population: Flow cytometric immunophenotyping is performed using antibiodies to the antigens listed  in the table below. Electronic gates are placed around a cell cluster displaying light scatter properties corresponding to lymphocytes. - Abnormal Cells in gated population: NA - Phenotype of Abnormal Cells: NA 1 of REVIEW OF SYSTEMS:  Constitutional: Denies fevers, chills or abnormal weight loss; + night sweats Eyes: Denies blurriness of vision Ears, nose, mouth, throat, and face: Denies mucositis or sore throat Respiratory: Denies cough, dyspnea or wheezes Cardiovascular: Denies palpitation, chest discomfort or lower extremity swelling Gastrointestinal:  Denies nausea, heartburn or change in bowel habits Skin: Denies abnormal skin rashes Lymphatics: Denies new lymphadenopathy or easy bruising Neurological:Denies numbness, tingling or new weaknesses Behavioral/Psych: Mood is stable, no new changes  All other systems were reviewed with the patient and are negative.  PHYSICAL EXAM:  height is 5' 7"  (1.702 m) and weight is 223 lb 3.2 oz (101.243 kg). His oral temperature is 98 F (36.7 C). His blood pressure is 114/87 and his pulse is 84. His respiration is 20.    GENERAL:alert, no distress and comfortable SKIN: skin color, texture, turgor are normal, no rashes or significant lesions EYES: normal, Conjunctiva are pink and non-injected, sclera clear OROPHARYNX:no exudate, no erythema and lips, buccal mucosa, and tongue normal ; No signs of thrush. NECK: supple, thyroid normal size, non-tender, without nodularity LYMPH:   palpable lymphadenopathy in the left cervical and supraclavicular, right axillary (greater than 2 cm, rubbery).  No palpable lymphadenopathy in inguinal area LUNGS: clear to auscultation and percussion with normal breathing effort HEART: regular rate & rhythm and no murmurs and no lower extremity edema ABDOMEN:abdomen soft, non-tender and normal bowel sounds Musculoskeletal:no cyanosis of digits and no clubbing  NEURO: alert & oriented x 3 with fluent speech, no focal  motor/sensory deficits   IMPRESSION: MOURAD CWIKLA is a 48 y.o. male with a history of  T-cell lymphoma awaiting specific subtype.  ECOG 0.    PLAN: 1.  Peripheral T-cell lymphoma. --We reviewed his imaging  of CT of neck and pathology (additional studies are still pending) and told it was consistent with peripheral t-cell lymphoma.  We cannot its specific subtype until further characterization based on his excisional lymph node is obtained.  We discussed his case with pathology personally who will had the required additional stains done by middle of next week.     --He has an excellent performance status and night sweats are concerning for a B-symptom.   His CBC is within normal limits with normal plts.  LDH and uric acid level is elevated.  To complete work up pending his final pathology, we will obtain a bone marrow biopsy, Chest/abdominal/pelvic CT with contrast of diagnostic quality and PET scan, and an echocardiogram based on anticipated anthracycline use.    We will also test hepatitis and HIV.    --We will perform prophylaxis for tumor lysis syndrome given extent of his disease with allopurinol.  Staging will be based on results on imaging.  Treatment (induction) options will be discussed in detail based on further staging and EOD and histology.  We do not have any clinical trials for this diagnosis presently.  If Anaplastic large cell lymphoma--ALCL, ALK+ histology, we will perform (cyclophosphamide, doxorubicin, vincristine, prednisone) CHOP-21 or (Cyclophosphamide, doxorubicin, vincristine, etoposide, prednisone) CHOEP-21.  This subtype has a good pronogsis and does not need consolidative transplant if in remission.  If other histologies (ALCL, ALK-; peripheral t-cell lymphoma --PTCL, NOS; Angioimmunoblastic T-cell lymphoma--AITL; Enteropathy-associated T-cell lymphoma--EATL), we will consider CHOP-14 or CHOP-21 of CHOP followed by ICE and consider consolidation with high-dose therapy and  stem cell rescue through referral to either [facility] or [facility] for transplant evaluation.   --We will refer to radiation oncology on next visit once exact histology (if ALCL, ALK+) and further work-up complete and determined to Stage II.   IWe will put on schedule for hematology conference.    2.  DM2.  --Continue Metformin 500 mg daily per PCP/endo.  3. Hypertension/HLD. --Continue lisinopril 10 mg daily.  --Continue hydrochlorothiazide 50 mg daily with KCL 10 mEQ daily.  --Continue Lovastatin 500 mg daily.   4. Thyroid nodule (hypothyroidism).  --Continue levothyroxine 200 mg daily per endo.  5. Obesity --Continue dieting and weight lost.   6. Follow-up.  --Patient will require follow-up in 2 weeks with above studies including imaging and bone marrow biopsy and further consideration of chemotherapy treatment.   All questions were answered. The patient knows to call the clinic with any problems, questions or concerns. We can certainly see the patient much sooner if necessary.  I spent 40 minutes counseling the patient face to face. The total time spent in the appointment was 60 minutes.    Valen Mascaro, MD 01/14/2013 12:59 PM

## 2013-01-14 NOTE — Patient Instructions (Signed)
Bone Marrow Aspiration This is an examination in which a needle is inserted into a bone and a portion of the marrow in the bone is removed. This test confirms the diagnosis of anemias, leukemia, or myelomas. It helps determine the cause of decreased blood cells and deficient iron stores. It documents the process of metastasis if there has been a tumor which has spread to bone. It also helps study tumor staging, especially in lymphomas. There are two methods for sampling bone marrow, an aspirate and a biopsy. The bone marrow, a soft fatty tissue found inside the body's larger bones, is often described as being like a honeycomb  it is a fibrous network filled with a liquid containing cells in various stages of maturation, and "raw materials" such as iron, vitamin B12, and folate that are required for cell production. The bone marrow aspirate provides a liquid sample of cells that can be studied individually, and the biopsy collects a sample that preserves the marrow's structure and shows the relationships of bone marrow cells to one another and the relative amount of marrow cells compared to fat (cellularity).  Red blood cells (RBCs), platelets, and five different types of white blood cells (WBCs) are produced in the marrow as needed, with the number and type of cell being created at any one time based on the use of cells, loss, and a continual replacement of old cells. The bone marrow biopsy and aspiration provide information about the status of and capability for blood cell production. They are not routinely ordered and in fact the majority of people will never have one done. A marrow aspiration or biopsy may be ordered to help evaluate blood cell production, to help diagnose leukemia, to help diagnose a bone marrow disorder, to help diagnose and stage a variety of other types of cancer (to determine spread into the marrow), and to help determine whether a severe anemia is due to decreased RBC production,  increased loss, abnormal RBC production, or to a vitamin or mineral deficiency or excess. Conditions that affect the marrow can affect the number, mixture, and maturity of the cells, and can affect its fibrous structure. PREPARATION FOR TEST  No special preparation is needed. The bone marrow aspiration or biopsy procedure is performed by a caregiver. Both types of samples may be collected from the hip bone (pelvis), and marrow aspirations may be collected from the breastbone (sternum). In children, samples may also be collected from a vertebrae in the back or from the thigh bone (femur).  The most common collection site is the top ridge of the hip bone (iliac crest). Before the procedure, your blood pressure, heart rate, and temperature are measured and evaluated to make sure that they are within normal limits. You may be given a mild sedative. You will be asked to lie down on your stomach or side. Your lower body is draped with cloths so that only the area surrounding the site is exposed.  The site is cleaned with an antiseptic and injected with a local anesthetic. When the site is numb, the caregiver inserts a needle through the skin and into the bone. For an aspiration, the caregiver attaches a syringe to the needle and pulls back on the plunger. This creates vacuum pressure and pulls a small amount of marrow into the syringe.  For a bone marrow biopsy, the caregiver uses a special needle that allows the collection of a sample of bone and marrow. Even though your skin has been numbed, you may feel  brief but uncomfortable pressure sensations during these procedures. After the needle has been withdrawn, a sterile bandage is placed over the site and pressure is applied. You will then usually be instructed to lie quietly until your blood pressure, heart rate, and temperature are normal. You may be instructed to keep the site dry and covered for 48 hours.  Complications from the bone marrow aspiration or  biopsy procedure are rare, but some individuals may have excessive bleeding at the site or develop an infection.  Tell your caregiver about any allergies you have and about any medications or supplements you are taking prior to the procedure. NORMAL FINDINGS Cell type / Range (%)  Myeloblasts / less than 5  Promyelocytes / 1-8  Myelocytes  Neutrophilic / 1-83  Eosinophilic / 6.7-2  Basophilic / less than 1  Metamyelocytes  Neutrophilic / 55-00  Eosinophilic / less than 1  Basophilic / less than 1  Mature myelocytes  Neutrophilic / 16-42  Eosinophilic / less than 5  Basophilic / less than 5  Mononuclear  Monocytes / less than 5  Lymphocytes / 3-20  Plasma cells / less than 1  Megakaryocytes / less than 5  M/E ratio / less than 4  Normoblasts / 25-50 Normal iron content is demonstrated by staining with Prussian blue. Ranges for normal findings may vary among different laboratories and hospitals. You should always check with your caregiver after having lab work or other tests done to discuss the meaning of your test results and whether your values are considered within normal limits. MEANING OF TEST  Your caregiver will go over the test results with you and discuss the importance and meaning of your results, as well as treatment options and the need for additional tests if necessary. OBTAINING THE TEST RESULTS It is your responsibility to obtain your test results. Ask the lab or department performing the test when and how you will get your results. Document Released: 01/23/2004 Document Revised: 03/24/2011 Document Reviewed: 12/07/2007 [facility] Patient Information 2014 [place], [place].

## 2013-01-14 NOTE — Telephone Encounter (Signed)
gv and printed appt sched and avs for pt for Jan 2015....gv pt barium    °

## 2013-01-16 DIAGNOSIS — E039 Hypothyroidism, unspecified: Secondary | ICD-10-CM | POA: Insufficient documentation

## 2013-01-16 DIAGNOSIS — E785 Hyperlipidemia, unspecified: Secondary | ICD-10-CM | POA: Insufficient documentation

## 2013-01-16 DIAGNOSIS — E119 Type 2 diabetes mellitus without complications: Secondary | ICD-10-CM | POA: Insufficient documentation

## 2013-01-16 DIAGNOSIS — E669 Obesity, unspecified: Secondary | ICD-10-CM | POA: Insufficient documentation

## 2013-01-18 ENCOUNTER — Ambulatory Visit (HOSPITAL_COMMUNITY)
Admission: RE | Admit: 2013-01-18 | Discharge: 2013-01-18 | Disposition: A | Discharge status: Home / Self Care | Payer: PRIVATE HEALTH INSURANCE | Source: Ambulatory Visit | Attending: Internal Medicine | Admitting: Internal Medicine

## 2013-01-18 DIAGNOSIS — E785 Hyperlipidemia, unspecified: Secondary | ICD-10-CM | POA: Insufficient documentation

## 2013-01-18 DIAGNOSIS — R599 Enlarged lymph nodes, unspecified: Secondary | ICD-10-CM | POA: Insufficient documentation

## 2013-01-18 DIAGNOSIS — E669 Obesity, unspecified: Secondary | ICD-10-CM | POA: Insufficient documentation

## 2013-01-18 DIAGNOSIS — E119 Type 2 diabetes mellitus without complications: Secondary | ICD-10-CM | POA: Insufficient documentation

## 2013-01-18 DIAGNOSIS — I519 Heart disease, unspecified: Secondary | ICD-10-CM | POA: Insufficient documentation

## 2013-01-18 DIAGNOSIS — Z0189 Encounter for other specified special examinations: Secondary | ICD-10-CM

## 2013-01-18 DIAGNOSIS — C8409 Mycosis fungoides, extranodal and solid organ sites: Secondary | ICD-10-CM | POA: Insufficient documentation

## 2013-01-18 DIAGNOSIS — E039 Hypothyroidism, unspecified: Secondary | ICD-10-CM | POA: Insufficient documentation

## 2013-01-18 DIAGNOSIS — C8448 Peripheral T-cell lymphoma, not classified, lymph nodes of multiple sites: Secondary | ICD-10-CM

## 2013-01-18 NOTE — Progress Notes (Signed)
  Echocardiogram 2D Echocardiogram limited has been performed.  Diamond Nickel 01/18/2013, 10:49 AM

## 2013-01-19 ENCOUNTER — Encounter (HOSPITAL_COMMUNITY): Payer: Self-pay | Admitting: Pharmacy Technician

## 2013-01-20 ENCOUNTER — Other Ambulatory Visit: Payer: Self-pay | Admitting: Radiology

## 2013-01-24 ENCOUNTER — Other Ambulatory Visit: Payer: Self-pay | Admitting: Emergency Medicine

## 2013-01-24 ENCOUNTER — Encounter (HOSPITAL_COMMUNITY)
Admission: RE | Admit: 2013-01-24 | Discharge: 2013-01-24 | Disposition: A | Discharge status: Home / Self Care | Payer: PRIVATE HEALTH INSURANCE | Source: Ambulatory Visit | Attending: Internal Medicine | Admitting: Internal Medicine

## 2013-01-24 ENCOUNTER — Ambulatory Visit (HOSPITAL_COMMUNITY)
Admission: RE | Admit: 2013-01-24 | Discharge: 2013-01-24 | Disposition: A | Discharge status: Home / Self Care | Payer: PRIVATE HEALTH INSURANCE | Source: Ambulatory Visit | Attending: Internal Medicine | Admitting: Internal Medicine

## 2013-01-24 ENCOUNTER — Other Ambulatory Visit: Payer: Self-pay | Admitting: Internal Medicine

## 2013-01-24 ENCOUNTER — Encounter (HOSPITAL_COMMUNITY): Payer: Self-pay

## 2013-01-24 ENCOUNTER — Other Ambulatory Visit: Payer: Self-pay | Admitting: Medical Oncology

## 2013-01-24 DIAGNOSIS — I728 Aneurysm of other specified arteries: Secondary | ICD-10-CM

## 2013-01-24 DIAGNOSIS — I714 Abdominal aortic aneurysm, without rupture, unspecified: Secondary | ICD-10-CM | POA: Insufficient documentation

## 2013-01-24 DIAGNOSIS — R918 Other nonspecific abnormal finding of lung field: Secondary | ICD-10-CM | POA: Insufficient documentation

## 2013-01-24 DIAGNOSIS — C8448 Peripheral T-cell lymphoma, not classified, lymph nodes of multiple sites: Secondary | ICD-10-CM

## 2013-01-24 DIAGNOSIS — M479 Spondylosis, unspecified: Secondary | ICD-10-CM | POA: Insufficient documentation

## 2013-01-24 DIAGNOSIS — C8589 Other specified types of non-Hodgkin lymphoma, extranodal and solid organ sites: Secondary | ICD-10-CM | POA: Insufficient documentation

## 2013-01-24 DIAGNOSIS — R599 Enlarged lymph nodes, unspecified: Secondary | ICD-10-CM | POA: Insufficient documentation

## 2013-01-24 DIAGNOSIS — I700 Atherosclerosis of aorta: Secondary | ICD-10-CM | POA: Insufficient documentation

## 2013-01-24 DIAGNOSIS — I2510 Atherosclerotic heart disease of native coronary artery without angina pectoris: Secondary | ICD-10-CM | POA: Insufficient documentation

## 2013-01-24 DIAGNOSIS — C8129 Mixed cellularity classical Hodgkin lymphoma, extranodal and solid organ sites: Secondary | ICD-10-CM | POA: Insufficient documentation

## 2013-01-24 DIAGNOSIS — I2584 Coronary atherosclerosis due to calcified coronary lesion: Secondary | ICD-10-CM | POA: Insufficient documentation

## 2013-01-24 LAB — GLUCOSE, CAPILLARY: Glucose-Capillary: 145 mg/dL — ABNORMAL HIGH (ref 70–99)

## 2013-01-24 MED ORDER — IOHEXOL 300 MG/ML  SOLN
100.0000 mL | Freq: Once | INTRAMUSCULAR | Status: AC | PRN
  Administered 2013-01-24: 100 mL via INTRAVENOUS

## 2013-01-24 MED ORDER — FLUDEOXYGLUCOSE F - 18 (FDG) INJECTION
18.8000 | Freq: Once | INTRAVENOUS | Status: AC | PRN
  Administered 2013-01-24: 18.8 via INTRAVENOUS

## 2013-01-25 ENCOUNTER — Telehealth: Payer: Self-pay | Admitting: Internal Medicine

## 2013-01-25 ENCOUNTER — Ambulatory Visit (HOSPITAL_COMMUNITY)
Admission: RE | Admit: 2013-01-25 | Discharge: 2013-01-25 | Disposition: A | Discharge status: Home / Self Care | Payer: PRIVATE HEALTH INSURANCE | Source: Ambulatory Visit | Attending: Internal Medicine | Admitting: Internal Medicine

## 2013-01-25 ENCOUNTER — Encounter (HOSPITAL_COMMUNITY): Payer: Self-pay

## 2013-01-25 ENCOUNTER — Other Ambulatory Visit: Payer: Self-pay | Admitting: Internal Medicine

## 2013-01-25 ENCOUNTER — Other Ambulatory Visit: Payer: Self-pay

## 2013-01-25 ENCOUNTER — Telehealth: Payer: Self-pay

## 2013-01-25 DIAGNOSIS — E119 Type 2 diabetes mellitus without complications: Secondary | ICD-10-CM | POA: Insufficient documentation

## 2013-01-25 DIAGNOSIS — R599 Enlarged lymph nodes, unspecified: Secondary | ICD-10-CM | POA: Insufficient documentation

## 2013-01-25 DIAGNOSIS — D704 Cyclic neutropenia: Secondary | ICD-10-CM | POA: Insufficient documentation

## 2013-01-25 DIAGNOSIS — F3289 Other specified depressive episodes: Secondary | ICD-10-CM | POA: Insufficient documentation

## 2013-01-25 DIAGNOSIS — R918 Other nonspecific abnormal finding of lung field: Secondary | ICD-10-CM | POA: Insufficient documentation

## 2013-01-25 DIAGNOSIS — F329 Major depressive disorder, single episode, unspecified: Secondary | ICD-10-CM | POA: Insufficient documentation

## 2013-01-25 DIAGNOSIS — Z87891 Personal history of nicotine dependence: Secondary | ICD-10-CM | POA: Insufficient documentation

## 2013-01-25 DIAGNOSIS — D649 Anemia, unspecified: Secondary | ICD-10-CM | POA: Insufficient documentation

## 2013-01-25 DIAGNOSIS — I728 Aneurysm of other specified arteries: Secondary | ICD-10-CM | POA: Insufficient documentation

## 2013-01-25 DIAGNOSIS — C8448 Peripheral T-cell lymphoma, not classified, lymph nodes of multiple sites: Secondary | ICD-10-CM | POA: Insufficient documentation

## 2013-01-25 DIAGNOSIS — I700 Atherosclerosis of aorta: Secondary | ICD-10-CM | POA: Insufficient documentation

## 2013-01-25 DIAGNOSIS — I2510 Atherosclerotic heart disease of native coronary artery without angina pectoris: Secondary | ICD-10-CM | POA: Insufficient documentation

## 2013-01-25 DIAGNOSIS — Z79899 Other long term (current) drug therapy: Secondary | ICD-10-CM | POA: Insufficient documentation

## 2013-01-25 DIAGNOSIS — I10 Essential (primary) hypertension: Secondary | ICD-10-CM | POA: Insufficient documentation

## 2013-01-25 LAB — PROTIME-INR
INR: 1.11 (ref 0.00–1.49)
Prothrombin Time: 14.1 seconds (ref 11.6–15.2)

## 2013-01-25 LAB — CBC
HEMATOCRIT: 36.1 % — AB (ref 39.0–52.0)
Hemoglobin: 12.5 g/dL — ABNORMAL LOW (ref 13.0–17.0)
MCH: 30.4 pg (ref 26.0–34.0)
MCHC: 34.6 g/dL (ref 30.0–36.0)
MCV: 87.8 fL (ref 78.0–100.0)
Platelets: 323 10*3/uL (ref 150–400)
RBC: 4.11 MIL/uL — ABNORMAL LOW (ref 4.22–5.81)
RDW: 14.4 % (ref 11.5–15.5)
WBC: 9.8 10*3/uL (ref 4.0–10.5)

## 2013-01-25 LAB — BONE MARROW EXAM

## 2013-01-25 LAB — GLUCOSE, CAPILLARY: Glucose-Capillary: 114 mg/dL — ABNORMAL HIGH (ref 70–99)

## 2013-01-25 LAB — APTT: aPTT: 35 seconds (ref 24–37)

## 2013-01-25 MED ORDER — MIDAZOLAM HCL 2 MG/2ML IJ SOLN
INTRAMUSCULAR | Status: AC | PRN
  Administered 2013-01-25: 2 mg via INTRAVENOUS

## 2013-01-25 MED ORDER — FENTANYL CITRATE 0.05 MG/ML IJ SOLN
INTRAMUSCULAR | Status: AC | PRN
  Administered 2013-01-25: 50 ug via INTRAVENOUS
  Administered 2013-01-25: 100 ug via INTRAVENOUS

## 2013-01-25 MED ORDER — MIDAZOLAM HCL 2 MG/2ML IJ SOLN
INTRAMUSCULAR | Status: AC
  Filled 2013-01-25: qty 4

## 2013-01-25 MED ORDER — FENTANYL CITRATE 0.05 MG/ML IJ SOLN
INTRAMUSCULAR | Status: AC
  Filled 2013-01-25: qty 4

## 2013-01-25 MED ORDER — SODIUM CHLORIDE 0.9 % IV SOLN
INTRAVENOUS | Status: DC
  Administered 2013-01-25: 08:00:00 via INTRAVENOUS

## 2013-01-25 NOTE — Procedures (Signed)
Pt up and ambulated to bathroom without difficulty

## 2013-01-25 NOTE — Discharge Instructions (Signed)
Bone Marrow Aspiration, Bone Marrow Biopsy  Care After  Read the instructions outlined below and refer to this sheet in the next few weeks. These discharge instructions provide you with general information on caring for yourself after you leave the hospital. Your caregiver may also give you specific instructions. While your treatment has been planned according to the most current medical practices available, unavoidable complications occasionally occur. If you have any problems or questions after discharge, call your caregiver.  FINDING OUT THE RESULTS OF YOUR TEST  Not all test results are available during your visit. If your test results are not back during the visit, make an appointment with your caregiver to find out the results. Do not assume everything is normal if you have not heard from your caregiver or the medical facility. It is important for you to follow up on all of your test results.   HOME CARE INSTRUCTIONS   You have had sedation and may be sleepy or dizzy. Your thinking may not be as clear as usual. For the next 24 hours:   Only take over-the-counter or prescription medicines for pain, discomfort, and or fever as directed by your caregiver.   Do not drink alcohol.   Do not smoke.   Do not drive.   Do not make important legal decisions.   Do not operate heavy machinery.   Do not care for small children by yourself.   Keep your dressing clean and dry. You may replace dressing with a bandage after 24 hours.   You may take a bath or shower after 24 hours.   Use an ice pack for 20 minutes every 2 hours while awake for pain as needed.  SEEK MEDICAL CARE IF:    There is redness, swelling, or increasing pain at the biopsy site.   There is pus coming from the biopsy site.   There is drainage from a biopsy site lasting longer than one day.   An unexplained oral temperature above 102 F (38.9 C) develops.  SEEK IMMEDIATE MEDICAL CARE IF:    You develop a rash.   You have difficulty  breathing.   You develop any reaction or side effects to medications given.  Document Released: 07/19/2004 Document Revised: 03/24/2011 Document Reviewed: 12/28/2007  [employer] Patient Information 2014 [employer].

## 2013-01-25 NOTE — Progress Notes (Signed)
Final diagnosis is Classical Hodgkin Lymphoma, mixed cellularity subtype based on left supraclavicular lymph node for lymphoma. Patient has a follow up and will be made aware.  His pathology for his bone marrow biopsy is still pending.  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

## 2013-01-25 NOTE — Telephone Encounter (Signed)
added lb to 1/16 f/u. lmonvm for pt re new time for 1/16 @ 3pm.

## 2013-01-25 NOTE — Telephone Encounter (Signed)
S/w Tammy at [facility]. She had put orders in for BMP, sed rate, and Creactive protein. These were not drawn at short stay today. She asked [place] to draw them at his appt at [facility] 1/16. Conferred with Dr Juliann Mule and added them to appt here.

## 2013-01-25 NOTE — Procedures (Signed)
Technically successful CT guided bone marrow aspiration and biopsy of left iliac crest. No immediate complications. Awaiting pathology report.    

## 2013-01-25 NOTE — H&P (Signed)
. Chief Complaint: "I'm here for a bone marrow biopsy" Referring Physician:Chism HPI: [name] [name] is an 48 y.o. male with lymphoma who is scheduled for a bone marrow biopsy. PMHx and meds reviewed. Pt feeling ok otherwise, no recent fevers, illness.  Past Medical History:  Past Medical History  Diagnosis Date  . Hypertension   . Diabetes mellitus without complication     Pre-diabetes  . Depression   . Headache(784.0)   . Thyroid disease     Hx: of    Past Surgical History:  Past Surgical History  Procedure Laterality Date  . Tonsillectomy    . Mass excision Left 12/31/2012    Procedure: CERVICAL LYMPH NODE BIOPSY;  Surgeon: [name], MD;  Location: [facility];  Service: General;  Laterality: Left;    Family History:  Family History  Problem Relation Age of Onset  . Aneurysm Mother   . Obesity Mother   . Cancer Father   . Diabetes Father   . Cancer Brother   . Heart disease Other     Social History:  reports that he quit smoking about 7 months ago. His smoking use included Cigarettes. He smoked 0.00 packs per day. He has never used smokeless tobacco. He reports that he does not drink alcohol or use illicit drugs.  Allergies: No Known Allergies  Medications:   Medication List    ASK your doctor about these medications       DYMISTA 137-50 MCG/ACT Susp  Generic drug:  Azelastine-Fluticasone  Place 2 sprays into the nose 2 (two) times daily.     fexofenadine 180 MG tablet  Commonly known as:  ALLEGRA  Take 180 mg by mouth daily.     hydrochlorothiazide 50 MG tablet  Commonly known as:  HYDRODIURIL  Take 50 mg by mouth daily with breakfast.     levothyroxine 200 MCG tablet  Commonly known as:  SYNTHROID, LEVOTHROID  Take 200 mcg by mouth daily before breakfast.     lisinopril 10 MG tablet  Commonly known as:  PRINIVIL,ZESTRIL  Take 10 mg by mouth daily with breakfast.     lovastatin 40 MG 24 hr tablet  Commonly known as:  ALTOPREV  Take 40 mg by  mouth at bedtime.     metFORMIN 500 MG tablet  Commonly known as:  GLUCOPHAGE  Take 500 mg by mouth daily.     montelukast 10 MG tablet  Commonly known as:  SINGULAIR  Take 10 mg by mouth at bedtime.     naproxen sodium 220 MG tablet  Commonly known as:  ANAPROX  Take 220 mg by mouth daily as needed (pain).     potassium chloride 10 MEQ tablet  Commonly known as:  K-DUR,KLOR-CON  Take 10 mEq by mouth once.     sertraline 100 MG tablet  Commonly known as:  ZOLOFT  Take 100 mg by mouth daily with breakfast.        Please HPI for pertinent positives, otherwise complete 10 system ROS negative.  Physical Exam: BP 125/83  Pulse 91  Temp(Src) 97.9 F (36.6 C) (Oral)  Resp 20  SpO2 98% There is no weight on file to calculate BMI.   General Appearance:  Alert, cooperative, no distress, appears stated age  Head:  Normocephalic, without obvious abnormality, atraumatic  ENT: Unremarkable  Neck: Supple, symmetrical, trachea midline  Lungs:   Clear to auscultation bilaterally, no w/r/r,   Chest Wall:  No tenderness or deformity  Heart:  Regular rate  and rhythm, S1, S2 normal, no murmur, rub or gallop.  Abdomen:   Soft, non-tender, non distended.  Neurologic: Normal affect, no gross deficits.   Results for orders placed during the hospital encounter of 01/25/13 (from the past 48 hour(s))  APTT     Status: None   Collection Time    01/25/13  7:35 AM      Result Value Range   aPTT 35  24 - 37 seconds  CBC     Status: Abnormal   Collection Time    01/25/13  7:35 AM      Result Value Range   WBC 9.8  4.0 - 10.5 K/uL   RBC 4.11 (*) 4.22 - 5.81 MIL/uL   Hemoglobin 12.5 (*) 13.0 - 17.0 g/dL   HCT 36.1 (*) 39.0 - 52.0 %   MCV 87.8  78.0 - 100.0 fL   MCH 30.4  26.0 - 34.0 pg   MCHC 34.6  30.0 - 36.0 g/dL   RDW 14.4  11.5 - 15.5 %   Platelets 323  150 - 400 K/uL  PROTIME-INR     Status: None   Collection Time    01/25/13  7:35 AM      Result Value Range   Prothrombin Time  14.1  11.6 - 15.2 seconds   INR 1.11  0.00 - 1.49   Ct Chest W Contrast  01/24/2013   CLINICAL DATA:  Lymphoma.  EXAM: CT CHEST, ABDOMEN, AND PELVIS WITH CONTRAST  TECHNIQUE: Multidetector CT imaging of the chest, abdomen and pelvis was performed following the standard protocol during bolus administration of intravenous contrast.  CONTRAST:  128m OMNIPAQUE IOHEXOL 300 MG/ML  SOLN  COMPARISON:  PET-CT from today  FINDINGS: CT CHEST FINDINGS  No pleural effusion identified. No airspace consolidation noted. There are 2 nonspecific pulmonary nodules identified in the left lower lobe which measure up to 3 mm, image 36/series 4. 4 mm right lower lobe nodule is identified, image 27/series 4.  The trachea appears patent and is midline. The heart size appears normal. Calcified atherosclerotic change involves the LAD coronary artery. Enlarged mediastinal and hilar lymph nodes are identified. Index right hilar lymph node measures 1.6 cm, image 32/series 2. Index right paratracheal lymph node measures 2.1 cm, image 21/series 2. The sub- carinal lymph node measures 1.4 cm. Enlarged right internal mammary node is identified measuring 1.2 cm, image 20/series 2. Enlarged left supraclavicular and bilateral axillary lymph nodes are noted. Index left supraclavicular lymph node measures 1.7 cm, image 1/series 2. Right axillary lymph node measures 1.9 cm, image 15/series 2.  Review of the visualized bony structures is significant for mild spondylosis. No aggressive lytic or sclerotic bone lesions.  CT ABDOMEN AND PELVIS FINDINGS  No focal liver abnormality. The gallbladder appears normal. No biliary dilatation. Normal appearance of the pancreas. Multiple, faint low attenuation nodules within the spleen are noted. There is no splenomegaly.  The adrenal glands both appear normal. Normal appearance of both kidneys. The urinary bladder is unremarkable. Prostate gland and seminal vesicles are negative.  Normal caliber of the abdominal  aorta. The right hepatic artery appears replaced an arises from the superior mesenteric artery. 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 The aneurysm sac measures 8.5 cm in length and has a diameter of 5.8 cm.  Extensive upper abdominal and pelvic adenopathy is identified. Index portacaval lymph node measures 2.3 cm, image  58/series 2. Index right periaortic lymph node measures 2.8 cm, image 68/series 2. Several lymph nodes are noted within the splenic hilum. The largest measure 1.4 cm, image 52/series 2. Just below the bifurcation there is a left common iliac artery lymph node which measures 3.4 cm, image 86/series 2. Right external iliac node measures 2 cm, image 113/series 2. Bilateral inguinal lymph nodes appear borderline enlarged. Left inguinal node measures 1.1 cm, image 129/series 2.  Review of the visualized osseous structures is negative for aggressive lytic or sclerotic bone lesion.  IMPRESSION: CT chest:  1. Enlarged mediastinal, right hilar, internal mammary, left supraclavicular and of bilateral axillary lymph nodes compatible with lymphoma. 2. Small nonspecific pulmonary nodules are identified which measure up to 4 mm. Attention on followup imaging is advised. 3. Coronary artery calcifications CT abdomen and pelvis:  1. Extensive upper abdominal and pelvic adenopathy compatible with lymphoma. 2. Aneurysm involves the left hepatic artery.   Electronically Signed   By: Kerby Moors M.D.   On: 01/24/2013 10:54   Ct Abdomen Pelvis W Contrast  01/24/2013   CLINICAL DATA:  Lymphoma.  EXAM: CT CHEST, ABDOMEN, AND PELVIS WITH CONTRAST  TECHNIQUE: Multidetector CT imaging of the chest, abdomen and pelvis was performed following the standard protocol during bolus administration of intravenous contrast.  CONTRAST:  120m OMNIPAQUE IOHEXOL 300 MG/ML  SOLN  COMPARISON:  PET-CT from today  FINDINGS: CT  CHEST FINDINGS  No pleural effusion identified. No airspace consolidation noted. There are 2 nonspecific pulmonary nodules identified in the left lower lobe which measure up to 3 mm, image 36/series 4. 4 mm right lower lobe nodule is identified, image 27/series 4.  The trachea appears patent and is midline. The heart size appears normal. Calcified atherosclerotic change involves the LAD coronary artery. Enlarged mediastinal and hilar lymph nodes are identified. Index right hilar lymph node measures 1.6 cm, image 32/series 2. Index right paratracheal lymph node measures 2.1 cm, image 21/series 2. The sub- carinal lymph node measures 1.4 cm. Enlarged right internal mammary node is identified measuring 1.2 cm, image 20/series 2. Enlarged left supraclavicular and bilateral axillary lymph nodes are noted. Index left supraclavicular lymph node measures 1.7 cm, image 1/series 2. Right axillary lymph node measures 1.9 cm, image 15/series 2.  Review of the visualized bony structures is significant for mild spondylosis. No aggressive lytic or sclerotic bone lesions.  CT ABDOMEN AND PELVIS FINDINGS  No focal liver abnormality. The gallbladder appears normal. No biliary dilatation. Normal appearance of the pancreas. Multiple, faint low attenuation nodules within the spleen are noted. There is no splenomegaly.  The adrenal glands both appear normal. Normal appearance of both kidneys. The urinary bladder is unremarkable. Prostate gland and seminal vesicles are negative.  Normal caliber of the abdominal aorta. The right hepatic artery appears replaced an arises from the superior mesenteric artery. 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 The aneurysm sac measures 8.5 cm in length and has a diameter of 5.8 cm.  Extensive upper abdominal and pelvic adenopathy is identified. Index portacaval lymph node measures 2.3 cm, image  58/series 2. Index right periaortic lymph node measures 2.8 cm, image 68/series 2. Several lymph nodes are noted within the splenic hilum. The largest measure 1.4 cm, image 52/series 2. Just below the bifurcation there is a left common iliac artery lymph node which measures 3.4 cm, image 86/series 2. Right external iliac node  measures 2 cm, image 113/series 2. Bilateral inguinal lymph nodes appear borderline enlarged. Left inguinal node measures 1.1 cm, image 129/series 2.  Review of the visualized osseous structures is negative for aggressive lytic or sclerotic bone lesion.  IMPRESSION: CT chest:  1. Enlarged mediastinal, right hilar, internal mammary, left supraclavicular and of bilateral axillary lymph nodes compatible with lymphoma. 2. Small nonspecific pulmonary nodules are identified which measure up to 4 mm. Attention on followup imaging is advised. 3. Coronary artery calcifications CT abdomen and pelvis:  1. Extensive upper abdominal and pelvic adenopathy compatible with lymphoma. 2. Aneurysm involves the left hepatic artery.   Electronically Signed   By: Kerby Moors M.D.   On: 01/24/2013 10:54   Nm Pet Image Initial (pi) Skull Base To Thigh  01/24/2013   CLINICAL DATA:  Initial treatment strategy for lymphoma.  EXAM: NUCLEAR MEDICINE PET SKULL BASE TO THIGH  FASTING BLOOD GLUCOSE:  Value: 133m/dl  TECHNIQUE: 18.8 mCi F-18 FDG was injected intravenously. CT data was obtained and used for attenuation correction and anatomic localization only. (This was not acquired as a diagnostic CT examination.) Additional exam technical data entered on technologist worksheet.  COMPARISON:  None  FINDINGS: NECK  Bilateral, hypermetabolic cervical adenopathy is noted, left greater than right. Index lymph node within the left supraclavicular region measures 1.7 cm and has an SUV max equal to 9.2, image 48.  CHEST  No pleural effusion identified. There is no airspace consolidation or atelectasis identified. A few  scattered sub cm nodules are identified. These are too small to reliably characterize. Index nodule in the right apex measures 4 mm, image 59.  Enlarged and hypermetabolic mediastinal and right hilar lymph nodes are identified. Index right paratracheal lymph node measures 1.7 cm and has an SUV max equal 8.2, image 75. There is a hypermetabolic right internal mammary lymph node identified. This has an SU the max equal to 4.0, image 81.  Enlarged and hypermetabolic bilateral axillary lymph nodes are identified. Index right axillary node measures 1.7 cm and has an SUV max equal 8.3, image 60.  ABDOMEN/PELVIS  Borderline enlarged right cardiophrenic lymph node measures 1.2 cm and has an SUV max equal to 110. No focal liver abnormality identified. The gallbladder appears normal. Normal appearance of the pancreas. The spleen measures 10 cm in cranial caudal dimension. No focal splenic abnormality identified.  The adrenal glands are normal. Normal appearance of both kidneys. The urinary bladder is unremarkable.  Extensive upper abdominal adenopathy identified. Enlarged portal caval lymph node measures 3 cm and has an SUV max equal to 8.8, image 139. Left periaortic lymph node measures 2.4 cm and has an SUV max equal to 9.2 in, image 163. Extensive, bilateral iliac adenopathy is hypermetabolic. Index left external iliac node measures 2 cm and has an SUV max equal to 8.7, image 213. Borderline enlarged bilateral inguinal lymph nodes are identified which exhibit mild increased FDG uptake. Index left inguinal node measures 0.9 cm and has an SUV max equal 3.3  Calcified atherosclerotic disease involves the abdominal aorta. There is aneurysmal dilatation of the left hepatic artery. The aneurysm sac measures 7.7 cm in length and has a diameter of 5.3 cm.  SKELETON  No abnormal uptake identified within the axial skeleton or proximal appendicular skeleton.  IMPRESSION: 1. Hypermetabolic adenopathy is identified within the neck,  chest, abdomen and pelvis compatible with the history of lymphoma. 2. Upper abdominal aneurysm appears to originate from the left hepatic artery. These results were called by  telephone at the time of interpretation on 01/24/2013 at 10:07 AM to Dr. Concha Norway , who verbally acknowledged these results.   Electronically Signed   By: Kerby Moors M.D.   On: 01/24/2013 10:10    Assessment/Plan Lymphoma For CT guided BM biopsy today Discussed procedure, risks, complications, use of sedation. Labs reviewed, ok Consent signed in chart  Ascencion Dike PA-C 01/25/2013, 8:38 AM

## 2013-01-25 NOTE — Telephone Encounter (Signed)
I made patient aware that his final pathology for lymph node is consistent with classical hodgkin lymphoma, mixed cellularity subtype.  We will await bone marrow biopsy pathology.  He is recovering from the bone marrow biopsy done this afternoon.  He feels well.  He will call with questions and he knows to report to ER for any acute abdominal pain given his newly found hepatic aneurysm.

## 2013-01-28 ENCOUNTER — Encounter (INDEPENDENT_AMBULATORY_CARE_PROVIDER_SITE_OTHER): Payer: Self-pay

## 2013-01-28 ENCOUNTER — Ambulatory Visit (HOSPITAL_BASED_OUTPATIENT_CLINIC_OR_DEPARTMENT_OTHER): Payer: PRIVATE HEALTH INSURANCE | Admitting: Internal Medicine

## 2013-01-28 ENCOUNTER — Telehealth: Payer: Self-pay | Admitting: Internal Medicine

## 2013-01-28 ENCOUNTER — Other Ambulatory Visit (HOSPITAL_BASED_OUTPATIENT_CLINIC_OR_DEPARTMENT_OTHER): Payer: PRIVATE HEALTH INSURANCE

## 2013-01-28 VITALS — BP 127/86 | HR 106 | Temp 98.6°F | Resp 18 | Ht 67.0 in | Wt 224.7 lb

## 2013-01-28 DIAGNOSIS — C8448 Peripheral T-cell lymphoma, not classified, lymph nodes of multiple sites: Secondary | ICD-10-CM

## 2013-01-28 DIAGNOSIS — R590 Localized enlarged lymph nodes: Secondary | ICD-10-CM

## 2013-01-28 DIAGNOSIS — I10 Essential (primary) hypertension: Secondary | ICD-10-CM

## 2013-01-28 DIAGNOSIS — I2510 Atherosclerotic heart disease of native coronary artery without angina pectoris: Secondary | ICD-10-CM

## 2013-01-28 DIAGNOSIS — E109 Type 1 diabetes mellitus without complications: Secondary | ICD-10-CM

## 2013-01-28 DIAGNOSIS — I728 Aneurysm of other specified arteries: Secondary | ICD-10-CM

## 2013-01-28 DIAGNOSIS — E0789 Other specified disorders of thyroid: Secondary | ICD-10-CM

## 2013-01-28 DIAGNOSIS — E079 Disorder of thyroid, unspecified: Secondary | ICD-10-CM

## 2013-01-28 DIAGNOSIS — C8120 Mixed cellularity classical Hodgkin lymphoma, unspecified site: Secondary | ICD-10-CM

## 2013-01-28 DIAGNOSIS — E785 Hyperlipidemia, unspecified: Secondary | ICD-10-CM

## 2013-01-28 DIAGNOSIS — C8121 Mixed cellularity classical Hodgkin lymphoma, lymph nodes of head, face, and neck: Secondary | ICD-10-CM

## 2013-01-28 DIAGNOSIS — E669 Obesity, unspecified: Secondary | ICD-10-CM

## 2013-01-28 DIAGNOSIS — Z809 Family history of malignant neoplasm, unspecified: Secondary | ICD-10-CM | POA: Insufficient documentation

## 2013-01-28 DIAGNOSIS — C8128 Mixed cellularity classical Hodgkin lymphoma, lymph nodes of multiple sites: Secondary | ICD-10-CM

## 2013-01-28 DIAGNOSIS — I5189 Other ill-defined heart diseases: Secondary | ICD-10-CM

## 2013-01-28 LAB — BASIC METABOLIC PANEL (CC13)
Anion Gap: 11 mEq/L (ref 3–11)
BUN: 17 mg/dL (ref 7.0–26.0)
CO2: 26 mEq/L (ref 22–29)
CREATININE: 1.2 mg/dL (ref 0.7–1.3)
Calcium: 10.1 mg/dL (ref 8.4–10.4)
Chloride: 97 mEq/L — ABNORMAL LOW (ref 98–109)
Glucose: 130 mg/dl (ref 70–140)
POTASSIUM: 4.2 meq/L (ref 3.5–5.1)
Sodium: 135 mEq/L — ABNORMAL LOW (ref 136–145)

## 2013-01-28 LAB — LACTATE DEHYDROGENASE (CC13): LDH: 297 U/L — ABNORMAL HIGH (ref 125–245)

## 2013-01-28 LAB — HEPATITIS PANEL, ACUTE
HCV Ab: NEGATIVE
HEP B C IGM: NONREACTIVE
Hep A IgM: NONREACTIVE
Hepatitis B Surface Ag: NEGATIVE

## 2013-01-28 LAB — SEDIMENTATION RATE: SED RATE: 123 mm/h — AB (ref 0–16)

## 2013-01-28 LAB — HIV ANTIBODY (ROUTINE TESTING W REFLEX): HIV: NONREACTIVE

## 2013-01-28 LAB — C-REACTIVE PROTEIN: CRP: 1.9 mg/dL — ABNORMAL HIGH (ref <0.60)

## 2013-01-28 MED ORDER — ONDANSETRON HCL 8 MG PO TABS: 8.0000 mg | ORAL_TABLET | Freq: Two times a day (BID) | ORAL | Status: DC

## 2013-01-28 MED ORDER — DEXAMETHASONE 4 MG PO TABS: 8.0000 mg | ORAL_TABLET | Freq: Two times a day (BID) | ORAL | Status: DC

## 2013-01-28 MED ORDER — LORAZEPAM 0.5 MG PO TABS: 0.5000 mg | ORAL_TABLET | Freq: Four times a day (QID) | ORAL | Status: DC | PRN

## 2013-01-28 MED ORDER — PROCHLORPERAZINE MALEATE 10 MG PO TABS: 10.0000 mg | ORAL_TABLET | Freq: Four times a day (QID) | ORAL | Status: DC | PRN

## 2013-01-28 NOTE — Telephone Encounter (Signed)
Gave pt appt for chemo class and Md visit, emailed Sharyn Lull for chemo for january 2015

## 2013-01-28 NOTE — Patient Instructions (Addendum)
ABVD    Bleomycin injection What is this medicine? BLEOMYCIN (blee oh MYE sin) is a chemotherapy drug. It is used to treat many kinds of cancer like lymphoma, cervical cancer, head and neck cancer, and testicular cancer. It is also used to prevent and to treat fluid build-up around the lungs caused by some cancers. This medicine may be used for other purposes; ask your health care provider or pharmacist if you have questions. COMMON BRAND NAME(S): Blenoxane What should I tell my health care provider before I take this medicine? They need to know if you have any of these conditions: -cigarette smoker -kidney disease -lung disease -recent or ongoing radiation therapy -an unusual or allergic reaction to bleomycin, other chemotherapy agents, other medicines, foods, dyes, or preservatives -pregnant or trying to get pregnant -breast-feeding How should I use this medicine? This drug is given as an infusion into a vein or a body cavity. It can also be given as an injection into a muscle or under the skin. It is administered in a hospital or clinic by a specially trained health care professional. Talk to your pediatrician regarding the use of this medicine in children. Special care may be needed. Overdosage: If you think you have taken too much of this medicine contact a poison control center or emergency room at once. NOTE: This medicine is only for you. Do not share this medicine with others. What if I miss a dose? It is important not to miss your dose. Call your doctor or health care professional if you are unable to keep an appointment. What may interact with this medicine? -certain antibiotics given by injection -cisplatin -cyclosporine -diuretics -foscarnet -medicines to increase blood counts like filgrastim, pegfilgrastim, sargramostim -vaccines This list may not describe all possible interactions. Give your health care provider a list of all the medicines, herbs, non-prescription  drugs, or dietary supplements you use. Also tell them if you smoke, drink alcohol, or use illegal drugs. Some items may interact with your medicine. What should I watch for while using this medicine? Visit your doctor for checks on your progress. This drug may make you feel generally unwell. This is not uncommon, as chemotherapy can affect healthy cells as well as cancer cells. Report any side effects. Continue your course of treatment even though you feel ill unless your doctor tells you to stop. Call your doctor or health care professional for advice if you get a fever, chills or sore throat, or other symptoms of a cold or flu. Do not treat yourself. This drug decreases your body's ability to fight infections. Try to avoid being around people who are sick. Avoid taking products that contain aspirin, acetaminophen, ibuprofen, naproxen, or ketoprofen unless instructed by your doctor. These medicines may hide a fever. Do not become pregnant while taking this medicine. Women should inform their doctor if they wish to become pregnant or think they might be pregnant. There is a potential for serious side effects to an unborn child. Talk to your health care professional or pharmacist for more information. Do not breast-feed an infant while taking this medicine. There is a maximum amount of this medicine you should receive throughout your life. The amount depends on the medical condition being treated and your overall health. Your doctor will watch how much of this medicine you receive in your lifetime. Tell your doctor if you have taken this medicine before. What side effects may I notice from receiving this medicine? Side effects that you should report to your doctor  or health care professional as soon as possible: -allergic reactions like skin rash, itching or hives, swelling of the face, lips, or tongue -breathing problems -chest pain -confusion -cough -fast, irregular heartbeat -feeling faint or  lightheaded, falls -fever or chills -mouth sores -pain, tingling, numbness in the hands or feet -trouble passing urine or change in the amount of urine -yellowing of the eyes or skin Side effects that usually do not require medical attention (report to your doctor or health care professional if they continue or are bothersome): -darker skin color -hair loss -irritation at site where injected -loss of appetite -nail changes -nausea and vomiting -weight loss This list may not describe all possible side effects. Call your doctor for medical advice about side effects. You may report side effects to FDA at 1-800-FDA-1088. Where should I keep my medicine? This drug is given in a hospital or clinic and will not be stored at home. NOTE: This sheet is a summary. It may not cover all possible information. If you have questions about this medicine, talk to your doctor, pharmacist, or health care provider.  2014, [employer]. (2012-04-27 09:36:48) Vinblastine injection What is this medicine? VINBLASTINE (vin BLAS teen) is a chemotherapy drug. It slows the growth of cancer cells. This medicine is used to treat many types of cancer like breast cancer, testicular cancer, Hodgkin's disease, non-Hodgkin's lymphoma, and sarcoma. This medicine may be used for other purposes; ask your health care provider or pharmacist if you have questions. COMMON BRAND NAME(S): Velban What should I tell my health care provider before I take this medicine? They need to know if you have any of these conditions: -blood disorders -dental disease -gout -infection (especially a virus infection such as chickenpox, cold sores, or herpes) -liver disease -lung disease -nervous system disease -recent or ongoing radiation therapy -an unusual or allergic reaction to vinblastine, other chemotherapy agents, other medicines, foods, dyes, or preservatives -pregnant or trying to get pregnant -breast-feeding How should I  use this medicine? This drug is given as an infusion into a vein. It is administered in a hospital or clinic by a specially trained health care professional. If you have pain, swelling, burning or any unusual feeling around the site of your injection, tell your health care professional right away. Talk to your pediatrician regarding the use of this medicine in children. While this drug may be prescribed for selected conditions, precautions do apply. Overdosage: If you think you have taken too much of this medicine contact a poison control center or emergency room at once. NOTE: This medicine is only for you. Do not share this medicine with others. What if I miss a dose? It is important not to miss your dose. Call your doctor or health care professional if you are unable to keep an appointment. What may interact with this medicine? Do not take this medicine with any of the following medications: -erythromycin -itraconazole -mibefradil -voriconazole This medicine may also interact with the following medications: -cyclosporine -fluconazole -ketoconazole -medicines for seizures like phenytoin -medicines to increase blood counts like filgrastim, pegfilgrastim, sargramostim -vaccines -verapamil Talk to your doctor or health care professional before taking any of these medicines: -acetaminophen -aspirin -ibuprofen -ketoprofen -naproxen This list may not describe all possible interactions. Give your health care provider a list of all the medicines, herbs, non-prescription drugs, or dietary supplements you use. Also tell them if you smoke, drink alcohol, or use illegal drugs. Some items may interact with your medicine. What should I watch for while using  this medicine? Your condition will be monitored carefully while you are receiving this medicine. You will need important blood work done while you are taking this medicine. This drug may make you feel generally unwell. This is not uncommon, as  chemotherapy can affect healthy cells as well as cancer cells. Report any side effects. Continue your course of treatment even though you feel ill unless your doctor tells you to stop. In some cases, you may be given additional medicines to help with side effects. Follow all directions for their use. Call your doctor or health care professional for advice if you get a fever, chills or sore throat, or other symptoms of a cold or flu. Do not treat yourself. This drug decreases your body's ability to fight infections. Try to avoid being around people who are sick. This medicine may increase your risk to bruise or bleed. Call your doctor or health care professional if you notice any unusual bleeding. Be careful brushing and flossing your teeth or using a toothpick because you may get an infection or bleed more easily. If you have any dental work done, tell your dentist you are receiving this medicine. Avoid taking products that contain aspirin, acetaminophen, ibuprofen, naproxen, or ketoprofen unless instructed by your doctor. These medicines may hide a fever. Do not become pregnant while taking this medicine. Women should inform their doctor if they wish to become pregnant or think they might be pregnant. There is a potential for serious side effects to an unborn child. Talk to your health care professional or pharmacist for more information. Do not breast-feed an infant while taking this medicine. Men may have a lower sperm count while taking this medicine. Talk to your doctor if you plan to father a child. What side effects may I notice from receiving this medicine? Side effects that you should report to your doctor or health care professional as soon as possible: -allergic reactions like skin rash, itching or hives, swelling of the face, lips, or tongue -low blood counts - This drug may decrease the number of white blood cells, red blood cells and platelets. You may be at increased risk for infections and  bleeding. -signs of infection - fever or chills, cough, sore throat, pain or difficulty passing urine -signs of decreased platelets or bleeding - bruising, pinpoint red spots on the skin, black, tarry stools, nosebleeds -signs of decreased red blood cells - unusually weak or tired, fainting spells, lightheadedness -breathing problems -changes in hearing -change in the amount of urine -chest pain -high blood pressure -mouth sores -nausea and vomiting -pain, swelling, redness or irritation at the injection site -pain, tingling, numbness in the hands or feet -problems with balance, dizziness -seizures Side effects that usually do not require medical attention (report to your doctor or health care professional if they continue or are bothersome): -constipation -hair loss -jaw pain -loss of appetite -sensitivity to light -stomach pain -tumor pain This list may not describe all possible side effects. Call your doctor for medical advice about side effects. You may report side effects to FDA at 1-800-FDA-1088. Where should I keep my medicine? This drug is given in a hospital or clinic and will not be stored at home. NOTE: This sheet is a summary. It may not cover all possible information. If you have questions about this medicine, talk to your doctor, pharmacist, or health care provider.  2014, [employer]. (2007-09-27 17:15:59) Doxorubicin injection What is this medicine? DOXORUBICIN (dox oh ROO bi sin) is a chemotherapy drug.  It is used to treat many kinds of cancer like Hodgkin's disease, leukemia, non-Hodgkin's lymphoma, neuroblastoma, sarcoma, and Wilms' tumor. It is also used to treat bladder cancer, breast cancer, lung cancer, ovarian cancer, stomach cancer, and thyroid cancer. This medicine may be used for other purposes; ask your health care provider or pharmacist if you have questions. COMMON BRAND NAME(S): Adriamycin PFS, Adriamycin RDF, Adriamycin, Rubex What should I  tell my health care provider before I take this medicine? They need to know if you have any of these conditions: -blood disorders -heart disease, recent heart attack -infection (especially a virus infection such as chickenpox, cold sores, or herpes) -irregular heartbeat -liver disease -recent or ongoing radiation therapy -an unusual or allergic reaction to doxorubicin, other chemotherapy agents, other medicines, foods, dyes, or preservatives -pregnant or trying to get pregnant -breast-feeding How should I use this medicine? This drug is given as an infusion into a vein. It is administered in a hospital or clinic by a specially trained health care professional. If you have pain, swelling, burning or any unusual feeling around the site of your injection, tell your health care professional right away. Talk to your pediatrician regarding the use of this medicine in children. Special care may be needed. Overdosage: If you think you have taken too much of this medicine contact a poison control center or emergency room at once. NOTE: This medicine is only for you. Do not share this medicine with others. What if I miss a dose? It is important not to miss your dose. Call your doctor or health care professional if you are unable to keep an appointment. What may interact with this medicine? Do not take this medicine with any of the following medications: -cisapride -droperidol -halofantrine -pimozide -zidovudine This medicine may also interact with the following medications: -chloroquine -chlorpromazine -clarithromycin -cyclophosphamide -cyclosporine -erythromycin -medicines for depression, anxiety, or psychotic disturbances -medicines for irregular heart beat like amiodarone, bepridil, dofetilide, encainide, flecainide, propafenone, quinidine -medicines for seizures like ethotoin, fosphenytoin, phenytoin -medicines for nausea, vomiting like dolasetron, ondansetron, palonosetron -medicines  to increase blood counts like filgrastim, pegfilgrastim, sargramostim -methadone -methotrexate -pentamidine -progesterone -vaccines -verapamil Talk to your doctor or health care professional before taking any of these medicines: -acetaminophen -aspirin -ibuprofen -ketoprofen -naproxen This list may not describe all possible interactions. Give your health care provider a list of all the medicines, herbs, non-prescription drugs, or dietary supplements you use. Also tell them if you smoke, drink alcohol, or use illegal drugs. Some items may interact with your medicine. What should I watch for while using this medicine? Your condition will be monitored carefully while you are receiving this medicine. You will need important blood work done while you are taking this medicine. This drug may make you feel generally unwell. This is not uncommon, as chemotherapy can affect healthy cells as well as cancer cells. Report any side effects. Continue your course of treatment even though you feel ill unless your doctor tells you to stop. Your urine may turn red for a few days after your dose. This is not blood. If your urine is dark or brown, call your doctor. In some cases, you may be given additional medicines to help with side effects. Follow all directions for their use. Call your doctor or health care professional for advice if you get a fever, chills or sore throat, or other symptoms of a cold or flu. Do not treat yourself. This drug decreases your body's ability to fight infections. Try to avoid being around  people who are sick. This medicine may increase your risk to bruise or bleed. Call your doctor or health care professional if you notice any unusual bleeding. Be careful brushing and flossing your teeth or using a toothpick because you may get an infection or bleed more easily. If you have any dental work done, tell your dentist you are receiving this medicine. Avoid taking products that contain  aspirin, acetaminophen, ibuprofen, naproxen, or ketoprofen unless instructed by your doctor. These medicines may hide a fever. Men and women of childbearing age should use effective birth control methods while using taking this medicine. Do not become pregnant while taking this medicine. There is a potential for serious side effects to an unborn child. Talk to your health care professional or pharmacist for more information. Do not breast-feed an infant while taking this medicine. Do not let others touch your urine or other body fluids for 5 days after each treatment with this medicine. Caregivers should wear latex gloves to avoid touching body fluids during this time. There is a maximum amount of this medicine you should receive throughout your life. The amount depends on the medical condition being treated and your overall health. Your doctor will watch how much of this medicine you receive in your lifetime. Tell your doctor if you have taken this medicine before. What side effects may I notice from receiving this medicine? Side effects that you should report to your doctor or health care professional as soon as possible: -allergic reactions like skin rash, itching or hives, swelling of the face, lips, or tongue -low blood counts - this medicine may decrease the number of white blood cells, red blood cells and platelets. You may be at increased risk for infections and bleeding. -signs of infection - fever or chills, cough, sore throat, pain or difficulty passing urine -signs of decreased platelets or bleeding - bruising, pinpoint red spots on the skin, black, tarry stools, blood in the urine -signs of decreased red blood cells - unusually weak or tired, fainting spells, lightheadedness -breathing problems -chest pain -fast, irregular heartbeat -mouth sores -nausea, vomiting -pain, swelling, redness at site where injected -pain, tingling, numbness in the hands or feet -swelling of ankles, feet, or  hands -unusual bleeding or bruising Side effects that usually do not require medical attention (report to your doctor or health care professional if they continue or are bothersome): -diarrhea -facial flushing -hair loss -loss of appetite -missed menstrual periods -nail discoloration or damage -red or watery eyes -red colored urine -stomach upset This list may not describe all possible side effects. Call your doctor for medical advice about side effects. You may report side effects to FDA at 1-800-FDA-1088. Where should I keep my medicine? This drug is given in a hospital or clinic and will not be stored at home. NOTE: This sheet is a summary. It may not cover all possible information. If you have questions about this medicine, talk to your doctor, pharmacist, or health care provider.  2014, [employer]. (2012-04-27 09:54:34) Dacarbazine, DTIC injection What is this medicine? DACARBAZINE (da KAR ba zeen) is a chemotherapy drug. This medicine is used to treat skin cancer. It is also used with other medicines to treat Hodgkin's disease. This medicine may be used for other purposes; ask your health care provider or pharmacist if you have questions. COMMON BRAND NAME(S): DTIC-Dome What should I tell my health care provider before I take this medicine? They need to know if you have any of these conditions: -infection (especially  virus infection such as chickenpox, cold sores, or herpes) -kidney disease -liver disease -low blood counts like low platelets, red blood cells, white blood cells -recent radiation therapy -an unusual or allergic reaction to dacarbazine, other chemotherapy agents, other medicines, foods, dyes, or preservatives -pregnant or trying to get pregnant -breast-feeding How should I use this medicine? This drug is given as an injection or infusion into a vein. It is administered in a hospital or clinic by a specially trained health care professional. Talk to  your pediatrician regarding the use of this medicine in children. While this drug may be prescribed for selected conditions, precautions do apply. Overdosage: If you think you have taken too much of this medicine contact a poison control center or emergency room at once. NOTE: This medicine is only for you. Do not share this medicine with others. What if I miss a dose? It is important not to miss your dose. Call your doctor or health care professional if you are unable to keep an appointment. What may interact with this medicine? -medicines to increase blood counts like filgrastim, pegfilgrastim, sargramostim -vaccines This list may not describe all possible interactions. Give your health care provider a list of all the medicines, herbs, non-prescription drugs, or dietary supplements you use. Also tell them if you smoke, drink alcohol, or use illegal drugs. Some items may interact with your medicine. What should I watch for while using this medicine? Your condition will be monitored carefully while you are receiving this medicine. You will need important blood work done while you are taking this medicine. This drug may make you feel generally unwell. This is not uncommon, as chemotherapy can affect healthy cells as well as cancer cells. Report any side effects. Continue your course of treatment even though you feel ill unless your doctor tells you to stop. Call your doctor or health care professional for advice if you get a fever, chills or sore throat, or other symptoms of a cold or flu. Do not treat yourself. This drug decreases your body's ability to fight infections. Try to avoid being around people who are sick. This medicine may increase your risk to bruise or bleed. Call your doctor or health care professional if you notice any unusual bleeding. Be careful brushing and flossing your teeth or using a toothpick because you may get an infection or bleed more easily. If you have any dental work  done, tell your dentist you are receiving this medicine. Avoid taking products that contain aspirin, acetaminophen, ibuprofen, naproxen, or ketoprofen unless instructed by your doctor. These medicines may hide a fever. Do not become pregnant while taking this medicine. Women should inform their doctor if they wish to become pregnant or think they might be pregnant. There is a potential for serious side effects to an unborn child. Talk to your health care professional or pharmacist for more information. Do not breast-feed an infant while taking this medicine. What side effects may I notice from receiving this medicine? Side effects that you should report to your doctor or health care professional as soon as possible: -allergic reactions like skin rash, itching or hives, swelling of the face, lips, or tongue -low blood counts - this medicine may decrease the number of white blood cells, red blood cells and platelets. You may be at increased risk for infections and bleeding. -signs of infection - fever or chills, cough, sore throat, pain or difficulty passing urine -signs of decreased platelets or bleeding - bruising, pinpoint red spots on the  skin, black, tarry stools, blood in the urine -signs of decreased red blood cells - unusually weak or tired, fainting spells, lightheadedness -breathing problems -muscle pains -pain at site where injected -trouble passing urine or change in the amount of urine -vomiting -yellowing of the eyes or skin Side effects that usually do not require medical attention (report to your doctor or health care professional if they continue or are bothersome): -diarrhea -hair loss -loss of appetite -nausea -skin more sensitive to sun or ultraviolet light -stomach upset This list may not describe all possible side effects. Call your doctor for medical advice about side effects. You may report side effects to FDA at 1-800-FDA-1088. Where should I keep my medicine? This  drug is given in a hospital or clinic and will not be stored at home. NOTE: This sheet is a summary. It may not cover all possible information. If you have questions about this medicine, talk to your doctor, pharmacist, or health care provider.  2014, [employer]. (2007-04-20 16:56:39)

## 2013-01-30 DIAGNOSIS — E079 Disorder of thyroid, unspecified: Secondary | ICD-10-CM | POA: Insufficient documentation

## 2013-01-30 DIAGNOSIS — E0789 Other specified disorders of thyroid: Secondary | ICD-10-CM | POA: Insufficient documentation

## 2013-01-30 DIAGNOSIS — I5189 Other ill-defined heart diseases: Secondary | ICD-10-CM | POA: Insufficient documentation

## 2013-01-30 NOTE — Progress Notes (Signed)
[facility] OFFICE PROGRESS NOTE  PCP: [name], MD  DIAGNOSIS: Hodgkin's disease with mixed cellularity - Plan: Pulmonary function test, CBC with Differential, Comprehensive metabolic panel, IR Fluoro Guide CV Line Right, ondansetron (ZOFRAN) 8 MG tablet, dexamethasone (DECADRON) 4 MG tablet, prochlorperazine (COMPAZINE) 10 MG tablet, LORazepam (ATIVAN) 0.5 MG tablet, Ambulatory referral to Cardiology  Family history of cancer - Plan: Ambulatory referral to Genetics  Cervical lymphadenopathy  Aneurysm artery, hepatic  Diastolic dysfunction - Plan: Ambulatory referral to Cardiology  Thyroid lesion - Plan: TSH, T4, free  Chief Complaint  Patient presents with  . Hodgkin's disease with mixed cellularity   CURRENT THERAPY: ABVD planned to start 02/11/2013 (also determined by further work-up for his left hepatic aneurysm).     INTERVAL HISTORY: ARVINE CLAYBURN 48 y.o. male with newly diagnosed Classical Hodgkin's disease of mixed cellularity based on pathology of left supraclavicular biospy on 12/19.     We last saw him on 01/14/2013 at the request of Dr. Rosendo Gros for lymphadenopathy.  Based on initial impression, we were considering a peripheral T cell lymphoma but pathology was consistent with classical Hodgkin's disease.   Flow cytometry revealed a predominate CD4 T-cell population. There is no monoclonal B-cell population.  During his initial visit, we requested that he had staging imaging including a PET/CT, bone marrow biopsy and echocardiogram.  CT chest done on on 01/12 revealed the following:  1) enlarged mediastinal, right hilar, internal mammary, left supraclavicular and of bilateral axillary lymph nodes compatible with lymphoma; 2) small nonspecific pulmonary nodules are identified which measure up to 4 mm. Attention on followup imaging is advised; 3) Coronary artery calcifications. CT abdomen and pelvis revealed the following:  1) Extensive upper abdominal and  pelvic adenopathy compatible with lymphoma. 2) Aneurysm involves the left hepatic artery. PET on 01/12 revealed hypermetabolic adenopathy within  within the neck, chest, abdomen and pelvis compatible with the history of lymphoma and an upper abdominal aneurysm appears to originate from the left hepatic artery.  Dr. Geroge Baseman of interventional radiology was alerted of these findings and he is scheduled to have consultation on 02/02/2013.  Patient was also notified of these findings and instructed to report to emergency room with any acute abdominal pain.  His bone marrow biopsy was done on 01/25/2013.   It revealed a normocellular bone marrow for age with trileage hematopoiesis without tumor identified. His peripheral blood revealed normocytic-normochromic anemia.   His Echocardiogram revealed diastolic dysfunction and normal systolic function with a normal EF.   Of note, he has a history of DM2 and hypertension and reports that he first noticed in the end of August a lump at the base of his left neck. He states that he was seen by Endocrinology at the end of August for his thyroid. He was referred by Dr. Chalmers Cater for evaluation of his left-sided cervical lymphadenopathy. He reports mild discomfort. He has had some night sweats that are described as mild with the sheets occasionally wet. He denies subjective fevers. He denies any sicknesses or viral infections or recent travel. He underwent a CT scan of the neck (12/03/2012) that showed lymphadenopathy left cervical chain with right-sided axillary lymphadenopathy. He does report that prior to evaluation by Dr. Rosendo Gros and subquent excisional biopsy of Left cervical/supraclavicular lymphadenopathy on 12/22/2012 he had increased swell underneath his right armpit. This was associated with right hand tingling and pain. Pathology as noted above.  Due to his prior employment, he traveled to [place] from 2008 and  returned 2010. He has also lived in [place] in [place].  He works in [employer]. He stayed in [place] in the early 2010 for four years. He denies HIV risk factors and reports being in a monogamous relationship with condom use with a girlfriend. He was checked for HIV in 2010 and it was reportedly negative. Today, it is negative as well. He divorced about 11 years. He remains active with a stable weight. Additional history demonstrates that his father had Hodgkin's disease and his mother died of an abdominal aneurysm.     MEDICAL HISTORY: Past Medical History  Diagnosis Date  . Hypertension   . Diabetes mellitus without complication     Pre-diabetes  . Depression   . Headache(784.0)   . Thyroid disease     Hx: of    INTERIM HISTORY: has Cervical lymphadenopathy; Hodgkin's disease with mixed cellularity; DM (diabetes mellitus); Hyperlipemia; Obesity; Hypothyroidism; Aneurysm artery, hepatic; Family history of cancer; Diastolic dysfunction; and Thyroid lesion on his problem list.    ALLERGIES:  has No Known Allergies.  MEDICATIONS: has a current medication list which includes the following prescription(s): azelastine-fluticasone, fexofenadine, hydrochlorothiazide, levothyroxine, lisinopril, lovastatin, metformin, montelukast, naproxen sodium, potassium chloride, sertraline, dexamethasone, lorazepam, ondansetron, and prochlorperazine.  SURGICAL HISTORY:  Past Surgical History  Procedure Laterality Date  . Tonsillectomy    . Mass excision Left 12/31/2012    Procedure: CERVICAL LYMPH NODE BIOPSY;  Surgeon: [name], MD;  Location: [facility];  Service: General;  Laterality: Left;    REVIEW OF SYSTEMS:   Constitutional: Denies fevers, chills or abnormal weight loss Eyes: Denies blurriness of vision Ears, nose, mouth, throat, and face: Denies mucositis or sore throat Respiratory: Denies cough, dyspnea or wheezes Cardiovascular: Denies palpitation, chest discomfort or lower extremity swelling Gastrointestinal:  Denies nausea, heartburn  or change in bowel habits Skin: Denies abnormal skin rashes Lymphatics: Denies new lymphadenopathy or easy bruising Neurological:Denies numbness, tingling or new weaknesses Behavioral/Psych: Mood is stable, no new changes  All other systems were reviewed with the patient and are negative.  PHYSICAL EXAMINATION: ECOG PERFORMANCE STATUS: 1 - Symptomatic but completely ambulatory  Blood pressure 127/86, pulse 106, temperature 98.6 F (37 C), temperature source Oral, resp. rate 18, height 5' 7"  (1.702 m), weight 224 lb 11.2 oz (101.923 kg), SpO2 98.00%.  GENERAL:alert, no distress and comfortable, well developed and well-nourished SKIN: skin color, texture, turgor are normal, no rashes or significant lesions  EYES: normal, Conjunctiva are pink and non-injected, sclera clear  OROPHARYNX:no exudate, no erythema and lips, buccal mucosa, and tongue normal ; No signs of thrush.  NECK: supple, thyroid normal size, non-tender, without nodularity  LYMPH: palpable lymphadenopathy in the left cervical and supraclavicular, right axillary (greater than 2 cm, rubbery). No palpable lymphadenopathy in inguinal area  LUNGS: clear to auscultation and percussion with normal breathing effort  HEART: regular rate & rhythm and no murmurs and no lower extremity edema  ABDOMEN:abdomen soft, non-tender and normal bowel sounds  Musculoskeletal:no cyanosis of digits and no clubbing  NEURO: alert & oriented x 3 with fluent speech, no focal motor/sensory deficits  LABORATORY DATA: Results for orders placed in visit on 01/28/13 (from the past 48 hour(s))  HEPATITIS PANEL, ACUTE     Status: None   Collection Time    01/28/13  3:04 PM      Result Value Range   Hepatitis B Surface Ag NEGATIVE  NEGATIVE   HCV Ab NEGATIVE  NEGATIVE   Hep B C IgM NON REACTIVE  NON REACTIVE   Comment: High levels of Hepatitis B Core IgM antibody are detectableduring the acute stage of Hepatitis B. This antibody is usedto differentiate  current from past HBV infection.    Hep A IgM NON REACTIVE  NON REACTIVE  HIV ANTIBODY (ROUTINE TESTING)     Status: None   Collection Time    01/28/13  3:04 PM      Result Value Range   HIV NON REACTIVE  NON REACTIVE  SEDIMENTATION RATE     Status: Abnormal   Collection Time    01/28/13  3:04 PM      Result Value Range   Sed Rate 123 (*) 0 - 16 mm/hr  C-REACTIVE PROTEIN     Status: Abnormal   Collection Time    01/28/13  3:04 PM      Result Value Range   CRP 1.9 (*) <0.60 mg/dL  LACTATE DEHYDROGENASE (CC13)     Status: Abnormal   Collection Time    01/28/13  3:04 PM      Result Value Range   LDH 297 (*) 125 - 245 U/L  BASIC METABOLIC PANEL (RJ18)     Status: Abnormal   Collection Time    01/28/13  3:04 PM      Result Value Range   Sodium 135 (*) 136 - 145 mEq/L   Potassium 4.2  3.5 - 5.1 mEq/L   Chloride 97 (*) 98 - 109 mEq/L   CO2 26  22 - 29 mEq/L   Glucose 130  70 - 140 mg/dl   BUN 17.0  7.0 - 26.0 mg/dL   Creatinine 1.2  0.7 - 1.3 mg/dL   Calcium 10.1  8.4 - 10.4 mg/dL   Anion Gap 11  3 - 11 mEq/L    Labs:  Lab Results  Component Value Date   WBC 9.8 01/25/2013   HGB 12.5* 01/25/2013   HCT 36.1* 01/25/2013   MCV 87.8 01/25/2013   PLT 323 01/25/2013   NEUTROABS 5.0 01/14/2013      Chemistry      Component Value Date/Time   NA 135* 01/28/2013 1504   NA 136 12/29/2012 1006   K 4.2 01/28/2013 1504   K 4.4 12/29/2012 1006   CL 99 12/29/2012 1006   CO2 26 01/28/2013 1504   CO2 28 12/29/2012 1006   BUN 17.0 01/28/2013 1504   BUN 10 12/29/2012 1006   CREATININE 1.2 01/28/2013 1504   CREATININE 0.90 12/29/2012 1006      Component Value Date/Time   CALCIUM 10.1 01/28/2013 1504   CALCIUM 9.4 12/29/2012 1006   ALKPHOS 89 01/14/2013 1047   AST 28 01/14/2013 1047   ALT 20 01/14/2013 1047   BILITOT 0.27 01/14/2013 1047      Basic Metabolic Panel:  Recent Labs Lab 01/28/13 1504  NA 135*  K 4.2  CO2 26  GLUCOSE 130  BUN 17.0  CREATININE 1.2  CALCIUM 10.1    GFR Estimated Creatinine Clearance: 87.5 ml/min (by C-G formula based on Cr of 1.2).  Coagulation profile  Recent Labs Lab 01/25/13 0735  INR 1.11    CBC:  Recent Labs Lab 01/25/13 0735  WBC 9.8  HGB 12.5*  HCT 36.1*  MCV 87.8  PLT 323   CBG:  Recent Labs Lab 01/24/13 0718 01/25/13 0741  GLUCAP 145* 114*    Studies:  No results found.  RADIOGRAPHIC STUDIES: CT NECK WITH CONTRAST   12-28-2012  TECHNIQUE: Multidetector CT imaging of the neck was  performed using the standard protocol following the bolus administration of intravenous contrast. CONTRAST: 37m OMNIPAQUE IOHEXOL 300 MG/ML SOLN COMPARISON: None. FINDINGS: Limited imaging of the brain is unremarkable. No focal mucosal or submucosal lesions are present. The vocal cords are midline and symmetric. A calcified nodule within the isthmus of the thyroid measures 9 mm. The thyroid is mildly heterogeneous without other focal lesions. Multiple enlarged left-sided cervical lymph nodes are present. The largest left level 2 lymph node measures 3.3 by 1.3 cm on the sagittal images. More significantly enlarged left level 3 and level 4 lymph nodes measure up to 3.3 x 2.4 cm. Axillary lymph nodes are more prominent right than left. The largest right axillary lymph node measures 2.4 x 4.6 cm on the axial images. A 2.1 x 1.8 cm right paratracheal lymph node is present. Other smaller superior mediastinal lymph nodes are present. There smaller nodes scattered throughout the neck bilaterally. The left axilla is less completely imaged than on the right. The lung apices are clear. The bone windows are unremarkable. IMPRESSION: 1. Multiple enlarged cervical lymph nodes, most prominently on the left. These measure up to 3.3 x 2.4 cm. 2. Enlarged right axillary lymph nodes measuring up to 2.4 x 4.6 cm.  3. Enlarged mediastinal lymph nodes including a right paratracheal node measuring 2.1 x 1.8 cm. 4. No primary mucosal lesion. The findings  are most compatible with lymphoma. 5. Heterogeneous thyroid with a in 9 mm isthmus lesion. Consider further evaluation with thyroid ultrasound. If patient is clinically hyperthyroid, consider nuclear medicine thyroid uptake and scan.   CHEST 2 VIEW   12/29/2012  COMPARISON: None. FINDINGS: The heart size and mediastinal contours are within normal limits. Both lungs are clear. The visualized skeletal structures are unremarkable. IMPRESSION: No active cardiopulmonary disease. Electronically Signed By: HKathreen Devoid On: 12/29/2012 11:17  2D ECHOCARDIOGRAM  01/18/2013 Transthoracic Echocardiography  Patient: DLaban, OrourkeMR #: [id number] Date: 01/18/2013 Gender: M  Age: [age] Height: 177.8cm   Weight: 101.4kg BSA: 2.275m Pt. Status:  Room:    SONOGRAPHER GrDiamond NickelORDERING Bonny Vanleeuwen  PERFORMING [facility], Outpatient  cc: ------------------------------------------------------------ LV EF: 50% - 55% ------------------------------------------------------------ Indications: V58.11 Chemotherapy Evaluation. ------------------------------------------------------------ History: PMH: Peripheral T-cell lymphoma. Cervical lymphadenopathy. Obesity. Hypothyroidism. Risk factors: Diabetes mellitus. Dyslipidemia. ------------------------------------------------------------ Study Conclusions - Left ventricle: The cavity size was normal. Systolic function was normal. The estimated ejection fraction was in the range of 50% to 55%. Wall motion was normal; there were no regional wall motion abnormalities. Doppler parameters are consistent with abnormal left ventricular relaxation (grade 1 diastolic dysfunction). There was no evidence of elevated ventricular filling pressure by Doppler parameters. - Aortic valve: Trileaflet; normal thickness leaflets. Trivial regurgitation. - Mitral valve: No regurgitation. - Right ventricle: Systolic function was normal. Systolic pressure was within the normal  range. - Tricuspid valve: Trivial regurgitation. - Pericardium, extracardiac: A trivial pericardial effusion was identified. - Impressions: There is a prosthetic object in the liver, possibly in the hepatic veins that might be partially obstructing flow in the IVC. Impressions: - There is a prosthetic object in the liver, possibly in the hepatic veins that might be partially obstructing flow in the IVC. Transthoracic echocardiography. M-mode, limited 2D, spectral Doppler, and color Doppler. Height: Height: 177.8cm. Height: 70in. Weight: Weight: 101.4kg. Weight: 223lb. Body mass index: BMI: 32.1kg/m^2. Body surface area: BSA: 2.2786m Blood pressure: 134/97. Patient status: Outpatient. Location: Echo laboratory.  ------------------------------------------------------------  ------------------------------------------------------------ Left ventricle: The cavity size was normal. Systolic function was  normal. The estimated ejection fraction was in the range of 50% to 55%. Wall motion was normal; there were no regional wall motion abnormalities. Doppler parameters are consistent with abnormal left ventricular relaxation (grade 1 diastolic dysfunction). There was no evidence of elevated ventricular filling pressure by Doppler parameters.  ------------------------------------------------------------ Aortic valve: Trileaflet; normal thickness leaflets. Mobility was not restricted. Doppler: Transvalvular velocity was within the normal range. There was no stenosis. Trivial regurgitation.  ------------------------------------------------------------ Aorta: Aortic root: The aortic root was normal in size.  ------------------------------------------------------------ Mitral valve: Structurally normal valve. Mobility was not restricted. Doppler: Transvalvular velocity was within the normal range. There was no evidence for stenosis.  No regurgitation.  ------------------------------------------------------------ Left atrium: The atrium was normal in size.  ------------------------------------------------------------ Right ventricle: The cavity size was normal. Wall thickness was normal. Systolic function was normal. Systolic pressure was within the normal range.  ------------------------------------------------------------ Pulmonic valve: Structurally normal valve. Cusp separation was normal. Doppler: Transvalvular velocity was within the normal range. There was no evidence for stenosis. No regurgitation.  ------------------------------------------------------------ Tricuspid valve: Structurally normal valve. Doppler: Transvalvular velocity was within the normal range. Trivial regurgitation.  ------------------------------------------------------------ Pulmonary artery: The main pulmonary artery was normal-sized. Systolic pressure was within the normal range.  ------------------------------------------------------------ Right atrium: The atrium was normal in size.  ------------------------------------------------------------ Pericardium: A trivial pericardial effusion was identified.  ------------------------------------------------------------ Systemic veins: Inferior vena cava: The vessel was normal in size.  ------------------------------------------------------------  2D measurements Normal Doppler measurements Normal Left ventricle Left ventricle LVID ED, 33.8 mm 43-52 Ea, lat ann, 8.9 cm/s ------ chord, tiss DP 2 PLAX E/Ea, lat 6.8 ------ LVID ES, 28.3 mm 23-38 ann, tiss DP 5 chord, Ea, med ann, 6.8 cm/s ------ PLAX tiss DP 5 FS, chord, 16 % >29 E/Ea, med 8.9 ------ PLAX ann, tiss DP 2 LVPW, ED 10.6 mm ------ Mitral valve IVS/LVPW 0.87 <1.3 Peak E vel 61. cm/s ------ ratio, ED 1 Ventricular septum Peak A vel 51. cm/s ------ IVS, ED 9.19 mm ------ 9 Aorta Deceleration 236 ms 150-23 Root  diam, 31 mm ------ time 0 ED Peak E/A 1.2 ------ Left atrium ratio AP dim 34 mm ------ Right ventricle AP dim 1.5 cm/m^2 <2.2 Sa vel, lat 12. cm/s ------ index ann, tiss DP 5  ------------------------------------------------------------ Prepared and Electronically Authenticated by  Ct Chest W Contrast  01/24/2013   CLINICAL DATA:  Lymphoma.  EXAM: CT CHEST, ABDOMEN, AND PELVIS WITH CONTRAST  TECHNIQUE: Multidetector CT imaging of the chest, abdomen and pelvis was performed following the standard protocol during bolus administration of intravenous contrast.  CONTRAST:  150m OMNIPAQUE IOHEXOL 300 MG/ML  SOLN  COMPARISON:  PET-CT from today  FINDINGS: CT CHEST FINDINGS  No pleural effusion identified. No airspace consolidation noted. There are 2 nonspecific pulmonary nodules identified in the left lower lobe which measure up to 3 mm, image 36/series 4. 4 mm right lower lobe nodule is identified, image 27/series 4.  The trachea appears patent and is midline. The heart size appears normal. Calcified atherosclerotic change involves the LAD coronary artery. Enlarged mediastinal and hilar lymph nodes are identified. Index right hilar lymph node measures 1.6 cm, image 32/series 2. Index right paratracheal lymph node measures 2.1 cm, image 21/series 2. The sub- carinal lymph node measures 1.4 cm. Enlarged right internal mammary node is identified measuring 1.2 cm, image 20/series 2. Enlarged left supraclavicular and bilateral axillary lymph nodes are noted. Index left supraclavicular lymph node measures 1.7 cm, image 1/series 2. Right axillary lymph node measures 1.9 cm,  image 15/series 2.  Review of the visualized bony structures is significant for mild spondylosis. No aggressive lytic or sclerotic bone lesions.  CT ABDOMEN AND PELVIS FINDINGS  No focal liver abnormality. The gallbladder appears normal. No biliary dilatation. Normal appearance of the pancreas. Multiple, faint low attenuation nodules within the  spleen are noted. There is no splenomegaly.  The adrenal glands both appear normal. Normal appearance of both kidneys. The urinary bladder is unremarkable. Prostate gland and seminal vesicles are negative.  Normal caliber of the abdominal aorta. The right hepatic artery appears replaced an arises from the superior mesenteric artery. 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 The aneurysm sac measures 8.5 cm in length and has a diameter of 5.8 cm.  Extensive upper abdominal and pelvic adenopathy is identified. Index portacaval lymph node measures 2.3 cm, image 58/series 2. Index right periaortic lymph node measures 2.8 cm, image 68/series 2. Several lymph nodes are noted within the splenic hilum. The largest measure 1.4 cm, image 52/series 2. Just below the bifurcation there is a left common iliac artery lymph node which measures 3.4 cm, image 86/series 2. Right external iliac node measures 2 cm, image 113/series 2. Bilateral inguinal lymph nodes appear borderline enlarged. Left inguinal node measures 1.1 cm, image 129/series 2.  Review of the visualized osseous structures is negative for aggressive lytic or sclerotic bone lesion.  IMPRESSION: CT chest:  1. Enlarged mediastinal, right hilar, internal mammary, left supraclavicular and of bilateral axillary lymph nodes compatible with lymphoma. 2. Small nonspecific pulmonary nodules are identified which measure up to 4 mm. Attention on followup imaging is advised. 3. Coronary artery calcifications CT abdomen and pelvis:  1. Extensive upper abdominal and pelvic adenopathy compatible with lymphoma. 2. Aneurysm involves the left hepatic artery.   Electronically Signed   By: Kerby Moors M.D.   On: 01/24/2013 10:54   Ct Abdomen Pelvis W Contrast  01/24/2013   CLINICAL DATA:  Lymphoma.  EXAM: CT CHEST, ABDOMEN, AND PELVIS WITH CONTRAST  TECHNIQUE: Multidetector CT  imaging of the chest, abdomen and pelvis was performed following the standard protocol during bolus administration of intravenous contrast.  CONTRAST:  124m OMNIPAQUE IOHEXOL 300 MG/ML  SOLN  COMPARISON:  PET-CT from today  FINDINGS: CT CHEST FINDINGS  No pleural effusion identified. No airspace consolidation noted. There are 2 nonspecific pulmonary nodules identified in the left lower lobe which measure up to 3 mm, image 36/series 4. 4 mm right lower lobe nodule is identified, image 27/series 4.  The trachea appears patent and is midline. The heart size appears normal. Calcified atherosclerotic change involves the LAD coronary artery. Enlarged mediastinal and hilar lymph nodes are identified. Index right hilar lymph node measures 1.6 cm, image 32/series 2. Index right paratracheal lymph node measures 2.1 cm, image 21/series 2. The sub- carinal lymph node measures 1.4 cm. Enlarged right internal mammary node is identified measuring 1.2 cm, image 20/series 2. Enlarged left supraclavicular and bilateral axillary lymph nodes are noted. Index left supraclavicular lymph node measures 1.7 cm, image 1/series 2. Right axillary lymph node measures 1.9 cm, image 15/series 2.  Review of the visualized bony structures is significant for mild spondylosis. No aggressive lytic or sclerotic bone lesions.  CT ABDOMEN AND PELVIS FINDINGS  No focal liver abnormality. The gallbladder appears normal. No biliary dilatation. Normal appearance of the pancreas. Multiple, faint low attenuation nodules within the spleen are  noted. There is no splenomegaly.  The adrenal glands both appear normal. Normal appearance of both kidneys. The urinary bladder is unremarkable. Prostate gland and seminal vesicles are negative.  Normal caliber of the abdominal aorta. The right hepatic artery appears replaced an arises from the superior mesenteric artery. There is then aneurysm which involves stress set there is a fusiform aneurysm involving the left  hepatic artery which arises from the celiac trunk. The aneurysm sac measures 8.5 cm in length and has a diameter of 5.8 cm.  Extensive upper abdominal and pelvic adenopathy is identified. Index portacaval lymph node measures 2.3 cm, image 58/series 2. Index right periaortic lymph node measures 2.8 cm, image 68/series 2. Several lymph nodes are noted within the splenic hilum. The largest measure 1.4 cm, image 52/series 2. Just below the bifurcation there is a left common iliac artery lymph node which measures 3.4 cm, image 86/series 2. Right external iliac node measures 2 cm, image 113/series 2. Bilateral inguinal lymph nodes appear borderline enlarged. Left inguinal node measures 1.1 cm, image 129/series 2.  Review of the visualized osseous structures is negative for aggressive lytic or sclerotic bone lesion.  IMPRESSION: CT chest:  1. Enlarged mediastinal, right hilar, internal mammary, left supraclavicular and of bilateral axillary lymph nodes compatible with lymphoma. 2. Small nonspecific pulmonary nodules are identified which measure up to 4 mm. Attention on followup imaging is advised. 3. Coronary artery calcifications CT abdomen and pelvis:  1. Extensive upper abdominal and pelvic adenopathy compatible with lymphoma. 2. Aneurysm involves the left hepatic artery.   Electronically Signed   By: Kerby Moors M.D.   On: 01/24/2013 10:54   Nm Pet Image Initial (pi) Skull Base To Thigh  01/24/2013   CLINICAL DATA:  Initial treatment strategy for lymphoma.  EXAM: NUCLEAR MEDICINE PET SKULL BASE TO THIGH  FASTING BLOOD GLUCOSE:  Value: 113m/dl  TECHNIQUE: 18.8 mCi F-18 FDG was injected intravenously. CT data was obtained and used for attenuation correction and anatomic localization only. (This was not acquired as a diagnostic CT examination.) Additional exam technical data entered on technologist worksheet.  COMPARISON:  None  FINDINGS: NECK  Bilateral, hypermetabolic cervical adenopathy is noted, left greater  than right. Index lymph node within the left supraclavicular region measures 1.7 cm and has an SUV max equal to 9.2, image 48.  CHEST  No pleural effusion identified. There is no airspace consolidation or atelectasis identified. A few scattered sub cm nodules are identified. These are too small to reliably characterize. Index nodule in the right apex measures 4 mm, image 59.  Enlarged and hypermetabolic mediastinal and right hilar lymph nodes are identified. Index right paratracheal lymph node measures 1.7 cm and has an SUV max equal 8.2, image 75. There is a hypermetabolic right internal mammary lymph node identified. This has an SU the max equal to 4.0, image 81.  Enlarged and hypermetabolic bilateral axillary lymph nodes are identified. Index right axillary node measures 1.7 cm and has an SUV max equal 8.3, image 60.  ABDOMEN/PELVIS  Borderline enlarged right cardiophrenic lymph node measures 1.2 cm and has an SUV max equal to 110. No focal liver abnormality identified. The gallbladder appears normal. Normal appearance of the pancreas. The spleen measures 10 cm in cranial caudal dimension. No focal splenic abnormality identified.  The adrenal glands are normal. Normal appearance of both kidneys. The urinary bladder is unremarkable.  Extensive upper abdominal adenopathy identified. Enlarged portal caval lymph node measures 3 cm and has an SUV max  equal to 8.8, image 139. Left periaortic lymph node measures 2.4 cm and has an SUV max equal to 9.2 in, image 163. Extensive, bilateral iliac adenopathy is hypermetabolic. Index left external iliac node measures 2 cm and has an SUV max equal to 8.7, image 213. Borderline enlarged bilateral inguinal lymph nodes are identified which exhibit mild increased FDG uptake. Index left inguinal node measures 0.9 cm and has an SUV max equal 3.3  Calcified atherosclerotic disease involves the abdominal aorta. There is aneurysmal dilatation of the left hepatic artery. The aneurysm sac  measures 7.7 cm in length and has a diameter of 5.3 cm.  SKELETON  No abnormal uptake identified within the axial skeleton or proximal appendicular skeleton.  IMPRESSION: 1. Hypermetabolic adenopathy is identified within the neck, chest, abdomen and pelvis compatible with the history of lymphoma. 2. Upper abdominal aneurysm appears to originate from the left hepatic artery. These results were called by telephone at the time of interpretation on 01/24/2013 at 10:07 AM to Dr. Concha Norway , who verbally acknowledged these results.   Electronically Signed   By: Kerby Moors M.D.   On: 01/24/2013 10:10   Ct Biopsy  01/25/2013   INDICATION: Evaluate for lymphoma  EXAM: CT BIOPSY  MEDICATIONS: Fentanyl 150 mcg IV; Versed 2 mg IV  ANESTHESIA/SEDATION: Sedation Time  10 minutes  CONTRAST:  None  PROCEDURE: Informed consent was obtained from the patient following an explanation of the procedure, risks, benefits and alternatives. The patient understands, agrees and consents for the procedure. All questions were addressed. A time out was performed prior to the initiation of the procedure. The patient was positioned prone and noncontrast localization CT was performed of the pelvis to demonstrate the iliac marrow spaces. The operative site was prepped and draped in the usual sterile fashion.  Under sterile conditions and local anesthesia, an 11 gauge coaxial bone biopsy needle was advanced into the left iliac marrow space. Needle position was confirmed with CT imaging. Initially, bone marrow aspiration was performed. Next, a bone marrow biopsy was obtained with the 11 gauge outer bone marrow device. Samples were prepared with the cytotechnologist and deemed adequate. The needle was removed intact. Hemostasis was obtained with compression and a dressing was placed. The patient tolerated the procedure well without immediate post procedural complication.  COMPLICATIONS: None immediate.  IMPRESSION: Successful CT guided left  iliac bone marrow aspiration and core biopsies.   Electronically Signed   By: Sandi Mariscal M.D.   On: 01/25/2013 09:53   PATHOLOGY: Tissue-Flow Cytometry - PREDOMINATE CD4 T-CELL POPULATION, SEE COMMENT.Diagnosis Comment:Lymphocytes consist predominately of T-cells (90%) with the majority (83%) of those being CD4 positive.There is no aberrant phenotype. No monoclonal B-cell population is seen. See tissue specimen ([id number]) for further details and correlation.  Lymph node for lymphoma, left supraclavicular - CLASSICAL HODGKIN LYMPHOMA, MIXED CELLULARITY SUBTYPE. - SEE ONCOLOGY TABLE. Microscopic Comment LYMPHOMA Histologic type: Classical Hodgkin lymphoma, mixed cellularity subtype. Grade (if applicable): N/A. Flow cytometry: [id number] reveals a predominate CD4 T-cell population. There is no monoclonal B-cell population.Immunohistochemical stains: CD45, CD20, CD3, CD10, bcl-2, bcl-6, CD21, CD4, CD8, CD5, cyclinD1, Ki-67, CD30, CD15, CD79a Outside immunohistochemistry: EBER, beta F1, CD2, CD7, pax-5 Touch preps/imprints: Small lymphocytes with rare large cells. Comments: Sections of lymph node are effaced by a diffuse lymphohistiocytic infiltrate composed of small lymphocytes with mildly irregular nuclear contours and inconspicuous nucleoli. There are background plasma cells and eosinophils. There is increased vascularity. There are scattered large cells with multinucleation and prominent nucleoli,  consistent with Hodgkin Reed-Sternberg (HRS) cells. There is no significant fibrosis.Immunohistochemistry reveals the majority of lymphocytes are CD3, CD4-positive T-cells. The T-cells have normal expression of CD5. Immunohistochemistry performed at an outside institution reveals the T-cells retain  normal expression of CD2 and CD7. They express betaF1. Only sparse CD8 cells are seen. Bcl-2 is positive in the T-cells. Ki-67 shows scattered large cells and is positive in many of the T-cells. CD20 and CD79a  highlight a few residual areas of B-cells. The HRS cells are positive CD30, CD15, and pax-5. HRS cells are negative for CD45. EBV in situ hybridization (EBER) is positive in scattered B-cells and negative in the T-cells and large HRS cells. CD21 highlights areas of residual follicular dendritic cell networks. CD10, bcl-6 and cyclinD1 are negative. Flow cytometry ([id number]) reveals a predominate population of CD4-positive T-cells  without aberrant phenotype. PCR for T-cell receptor gene rearrangement was performed at an outside insititution and revealed a polyclonal T-cell population (see outside report), excluding a T-cell lymphoma with HRS cells. Overall, these findings are consistent with a classical Hodgkin lymphoma, mixed cellularity subtype. Dr. Juliann Mule was paged on 01/20/2012.  01/26/2012 Diagnosis Bone Marrow, Aspirate,Biopsy, and Clot, left iliac - NORMOCELLULAR BONE MARROW FOR AGE WITH TRILINEAGE HEMATOPOIESIS. - NO TUMOR IDENTIFIED. PERIPHERAL BLOOD: - NORMOCYTIC-NORMOCHROMIC ANEMIA. Susanne Greenhouse MD Pathologist, Electronic Signature (Case signed 01/26/2013) GROSS AND MICROSCOPIC INFORMATION Specimen Clinical Information Hodgkin's lymphoma (je) Source Bone Marrow, Aspirate,Biopsy, and Clot, left iliac Microscopic LAB DATA: CBC performed on 01/25/2013 shows: WBC 9.8 K/ul Neutrophils 72% HB 12.5 g/dl Lymphocytes 21% HCT 36.1 % Monocytes 7% MCV 87.8 fL Eosinophils 0% RDW 14.4 % Basophils 0% PLT 323 K/ul PERIPHERAL BLOOD SMEAR: The red blood cells display mild anisopoikilocytosis with mild polychromasia. The white blood cells are normal in number, mostly with neutrophils. Occasional myelocytes are seen on scan. The platelets are normal in number. BONE MARROW ASPIRATE: Erythroid precursors: Orderly and progressive maturation. 1 of 2 FINAL for Schlereth, Darshan F ([id number]) Microscopic(continued) Granulocytic precursors: Orderly and progressive maturation. Megakaryocytes: Abundant with  predominately normal morphology. Lymphocytes/plasma cells: Large aggregates not present. TOUCH PREPARATIONS: A mixture of cell type present. CLOT and BIOPSY: The sections show 40 to 60% cellularity with a mixture of myeloid cell type. Significantlymphoid aggregates are not present.IRON STAIN: Iron stains are performed on a bone marrow aspirate smear and section of clot. The controls stained appropriately. Storage Iron: Present.Ringed Sideroblasts: Absent.ADDITIONAL DATA / TESTING: Specimen was sent for cytogenetic analysis and a separate report willfollow. (BNS:gt, 01/26/13) Specimen Table Bone Marrow count performed on 500 cells shows: Blasts: 1% Myeloid 70% Promyelocyts: 3% Myelocytes: 12% Erythroid 19% Metamyelocyts: 3% Bands: 25% Lymphocytes: 6% Neutrophils: 20% Eosinophils: 6% Plasma Cells: 4% Basophils: 0% Monocytes: 1% M:E ratio: 3.68 Gross Received in [employer] is a 1.3 x 1 x 0.1 cm aggregate of dark red soft tissue/material. Submitted in block A. Also received in [employer] in a separate container are two cores of tan-red firm tissue, 0.3 x 0.15 cm and 1.5 x   ASSESSMENT: Mady Gemma 48 y.o. male with a history of Hodgkin's disease with mixed cellularity - Plan: Pulmonary function test, CBC with Differential, Comprehensive metabolic panel, IR Fluoro Guide CV Line Right, ondansetron (ZOFRAN) 8 MG tablet, dexamethasone (DECADRON) 4 MG tablet, prochlorperazine (COMPAZINE) 10 MG tablet, LORazepam (ATIVAN) 0.5 MG tablet, Ambulatory referral to Cardiology  Family history of cancer - Plan: Ambulatory referral to Genetics  Cervical lymphadenopathy  Aneurysm artery, hepatic  Diastolic dysfunction - Plan: Ambulatory referral to Cardiology  Thyroid  lesion - Plan: TSH, T4, free  PLAN:  1. Classical Hodgkin's Disease with mixed cellularity, c/w Stage III (if pulmonary nodules which are less than 1cm are negative).  Bone marrow is negative for lymphoma.  IPS for Hodgkin's disease (3 points  for male, age greater than 24, albumin less than 4; 60% 5 year FFP, 78% 5-year overall survival based on Haseelever D, NEJM, 1998).  --We reviewed extensively his imaging of CT of neck, chest and abdomen and pelvis and PET and his bone marrow and left supraclavicular pathology and final report conclusive for classical hodgkin's disease with mixed cellularity c/w with stage III.  Pulmonary nodules measuring 4 mm are not conclusive for involvement.  He reports night sweats which would be considered a  constitutional  B symptoms. His ESR is more than 50. LDH of 297 is elevated above normal range. CXR does not demonstrate bulky mediastinal disease greater than 10 cm.    --We provided Mr. Dreyfus a detailed handout and a detailed discussion about his new diagnosis of Hodgkin's lymphoma. We addressed several of his questions. The handout covers basics on lymphoma and its staging and possible treatment options. We completed and reviewed the PET CT, a chest x-ray to evaluate for bulky disease greater than 10 cm, and a diagnostic CT of the abdomen and pelvis and chest to better characterize the extent of her disease and anatomy. They revealed  hypermetabolic adenopathy within the neck, chest, abdomen and pelvis compatible with the history of lymphoma.  Based on advanced lymphoma, he received a bone marrow biopsy and it was negative for involvement of lymphoma.  Cytogenetic report is pending. We evaluated his ejection fraction prior to doxorubicin-containing regimens with a 2D-echo as noted above.   --Patient will require Port-a-catheter placement and pulmonary function tests.   He will be referred to discuss fertility preservation options including semen cryo preseveration.   --We continue cheomtherapy with ABVD x 6 cycles (category 2A) q28 days (doxorubicin, bleomycin, vinblastine, and dacarbazine) (Duggan DB, JCO, 2003). We had a detailed discussion regarding the benefits of including approximately 80 percent of  patients with advanced stage HL will attain a complete response after treatment with ABVD. Up to one-quarter of patients will have disease progression requiring further therapy; half of those will have long term survival with autologous hematopoietic cell transplantation. Overall survival rates at 4, 7, and 10 years are approximately 90, 75, and 55 percent, respectively.  --The risks of ABVD was provided as a handout and the patient will also attend chemotherapy teaching. These risks include but are not limited to bone marrow suppression which can result in life-threatening infections, hair loss, GI disturbances, fatigue, nausea/vomiting. He understood the benefits and risk and chose to proceed. We will give Day #1  chemotherapy as follows (contingent on work-up of his left hepatic artery aneursym):          Day 1, Cycle 1 02/11/2013         Doxorubicin (Adriamycin) 25 mg/m2         Vinblastine 6 mg/m2         Bleomycin 10 units/m2         Dacarbazine 375 mg/m2   -We will obtain an restaging PET-CT following Cycle #2 (NCCN Guidelines, Version 2.2014, HODG-9) to assess response and further treatment based on [place] criteria for response. Patient was referred to chemotherapy training class.    --He has an excellent performance status and night sweats are concerning for a B-symptom. His CBC demonstrates mild  anemia with normal plts. Both  LDH and uric acid level is elevated.  We tested hepatitis and HIV with were negative. We will perform prophylaxis for tumor lysis syndrome given extent of his disease with allopurinol. Staging will be based on results on imaging.   2. Left Hepatic artery aneurysm.  --He has follow-up with Dr. Geroge Baseman for consideration of coil embolization on 11/02/2013.   He was counseled to report any symptoms of abdominal pain and avoid any sports or high energy exercises. He voiced understanding and agreement with this plan.   3. Heterogeneous thyroid with a in 9 mm isthmus  lesion.  --Consider further evaluation with thyroid ultrasound. If patient is clinically hyperthyroid, consider nuclear medicine thyroid uptake and scan.  --Check TSH, Free T4.    4. Diastolic Dysfunction, Grade I. --Echocardiogram as noted above.  Patient has a history of DM2, Hypertension.    4. Family History of cancer, cancer prior to age of 31. --Patient has Jewish ancestry and reports father was also diagnosed with hodgkin's disease.  We will refer [facility] to [facility] for consultation.   5. DM2.  --Continue Metformin 500 mg daily per PCP/endo.   6. Normocytic Normochromic anemia. --Likely anemia of chronic disease.     7. Hypertension/HLD/CAD.  --Continue lisinopril 10 mg daily.  --Continue hydrochlorothiazide 50 mg daily with KCL 10 mEQ daily.  --Continue Lovastatin 500 mg daily.   8. Hypothyroidism  --Continue levothyroxine 200 mg daily per endo. Check TSH, T4.  Thyroid ultrasound as needed. (see #4)  9. Obesity  --Continue dieting and weight lost.   10. Follow-up.  --Patient will require follow-up in 2 weeks with above studies including PFTs, Port-a-cath placement, cardiology evaluation, treatment for his hepatic aneurysm by interventional radiology, fertility preservation discussion, for further consideration of chemotherapy treatment.   All questions were answered. The patient knows to call the clinic with any problems, questions or concerns. We can certainly see the patient much sooner if necessary.  I spent 40 minutes counseling the patient face to face. The total time spent in the appointment was 60 minutes.    Kathyjo Briere, MD 01/30/2013 2:27 PM

## 2013-01-31 ENCOUNTER — Telehealth: Payer: Self-pay | Admitting: *Deleted

## 2013-01-31 ENCOUNTER — Encounter (INDEPENDENT_AMBULATORY_CARE_PROVIDER_SITE_OTHER): Payer: PRIVATE HEALTH INSURANCE | Admitting: General Surgery

## 2013-01-31 ENCOUNTER — Encounter (INDEPENDENT_AMBULATORY_CARE_PROVIDER_SITE_OTHER): Payer: Self-pay | Admitting: General Surgery

## 2013-01-31 ENCOUNTER — Other Ambulatory Visit: Payer: PRIVATE HEALTH INSURANCE

## 2013-01-31 ENCOUNTER — Telehealth: Payer: Self-pay | Admitting: Internal Medicine

## 2013-01-31 ENCOUNTER — Ambulatory Visit (INDEPENDENT_AMBULATORY_CARE_PROVIDER_SITE_OTHER): Payer: PRIVATE HEALTH INSURANCE | Admitting: General Surgery

## 2013-01-31 VITALS — BP 114/86 | HR 68 | Temp 97.9°F | Resp 16 | Ht 70.0 in | Wt 227.8 lb

## 2013-01-31 DIAGNOSIS — C8190 Hodgkin lymphoma, unspecified, unspecified site: Secondary | ICD-10-CM

## 2013-01-31 NOTE — Telephone Encounter (Signed)
s.w. pt and advised on 1.27.15 appt with Dr Johnsie Cancel @ 11am....gv pt address and phone #...Marland Kitchenpt ok and aware

## 2013-01-31 NOTE — Progress Notes (Signed)
Patient ID: [name] [name], male   DOB: Jan 04, 1966, 48 y.o.   MRN: [id number] Post op course The patient is a 48 male status post left neck lymph node biopsy. Patient has been doing well postoperatively. Patient has been diagnosed with Hodgkin's lymphoma, and is scheduled to undergo chemotherapy.. Patient underwent PET scan which revealed hepatic artery aneurysm. Patient is scheduled to meet with vascular surgery to address this.  On Exam: His left neck wound is clean dry and well healed.  Pathology:   Hodgkin's lymphoma.  This was discussed with the patient.  Assessment and Plan 48 -year-old male status post left neck lymph node biopsy, newly diagnosed Hodgkin's, and hepatic artery aneurysm 1. From my standpoint the patient can return as needed   [name], MD [facility] General & Minimally Invasive Surgery Trauma & Emergency Surgery

## 2013-01-31 NOTE — Telephone Encounter (Signed)
LM for patient to call and reschedule chemo education appt due to missed appt this am

## 2013-02-01 ENCOUNTER — Ambulatory Visit (INDEPENDENT_AMBULATORY_CARE_PROVIDER_SITE_OTHER): Payer: PRIVATE HEALTH INSURANCE | Admitting: Cardiology

## 2013-02-01 ENCOUNTER — Telehealth: Payer: Self-pay | Admitting: *Deleted

## 2013-02-01 ENCOUNTER — Telehealth: Payer: Self-pay | Admitting: Internal Medicine

## 2013-02-01 ENCOUNTER — Encounter: Payer: Self-pay | Admitting: *Deleted

## 2013-02-01 ENCOUNTER — Encounter (HOSPITAL_COMMUNITY): Payer: Self-pay | Admitting: Pharmacy Technician

## 2013-02-01 ENCOUNTER — Other Ambulatory Visit: Payer: Self-pay | Admitting: Radiology

## 2013-02-01 VITALS — BP 118/62 | HR 100 | Ht 70.0 in | Wt 227.0 lb

## 2013-02-01 DIAGNOSIS — Z09 Encounter for follow-up examination after completed treatment for conditions other than malignant neoplasm: Secondary | ICD-10-CM

## 2013-02-01 DIAGNOSIS — I429 Cardiomyopathy, unspecified: Secondary | ICD-10-CM

## 2013-02-01 DIAGNOSIS — I427 Cardiomyopathy due to drug and external agent: Secondary | ICD-10-CM | POA: Insufficient documentation

## 2013-02-01 DIAGNOSIS — C8190 Hodgkin lymphoma, unspecified, unspecified site: Secondary | ICD-10-CM

## 2013-02-01 DIAGNOSIS — Z9189 Other specified personal risk factors, not elsewhere classified: Secondary | ICD-10-CM

## 2013-02-01 DIAGNOSIS — I10 Essential (primary) hypertension: Secondary | ICD-10-CM

## 2013-02-01 DIAGNOSIS — T451X5A Adverse effect of antineoplastic and immunosuppressive drugs, initial encounter: Secondary | ICD-10-CM

## 2013-02-01 DIAGNOSIS — Z789 Other specified health status: Secondary | ICD-10-CM

## 2013-02-01 MED ORDER — CARVEDILOL 6.25 MG PO TABS: 6.2500 mg | ORAL_TABLET | Freq: Two times a day (BID) | ORAL | Status: DC

## 2013-02-01 NOTE — Progress Notes (Signed)
Patient ID: [name] [name], male   DOB: 28-Aug-1965, 48 y.o.   MRN: [id number] PCP: Dr. Chalmers Cater  48 yo with history of HTN, DM2, hyperlipidemia and recently diagnosed with Hodgkins disease presents for cardiology evaluation.  He will be started ABVD chemotherapy.  Given plan for doxorubicin use, he had a baseline echo showing EF 50-55% with grade I diastolic dysfunction.    Patient reports no exertional chest pain or dyspnea.  BP has been well-controlled, and diabetes has been reasonably controlled as well.  He has a large left hepatic artery aneurysm.  He will be seeing IR about it this week.   ECG: NSR, normal  Labs (1/15): K 4.2, creatinine 1.2  PMH: 1. Hypothyroidism 2. HTN 3. Hyperlipidemia 4. Type II diabetes 5. Depression 6. Hodgkins disease: To start chemotherapy.   7. Echo (1/15): EF 33-82%, grade I diastolic dysfunction, no significant valvular abnormalities.  8. 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   SH: Works in [employer]. Divorced, has girlfriend, quit smoking 5/14.    FH: Father with MI x 3, 1st in 68s-40s; father also had Hodgkins disease,  grandmother with MI, mother AAA  ROS: All systems reviewed and negative except as per HPI.   Current Outpatient Prescriptions  Medication Sig Dispense Refill  . Azelastine-Fluticasone (DYMISTA) 137-50 MCG/ACT SUSP Place 2 sprays into the nose 2 (two) times daily.       Marland Kitchen dexamethasone (DECADRON) 4 MG tablet Take 2 tablets (8 mg total) by mouth 2 (two) times daily with a meal. Take two times a day starting the day after chemotherapy for 3 days.  30 tablet  1  . fexofenadine (ALLEGRA) 180 MG tablet Take 180 mg by mouth daily.      . hydrochlorothiazide (HYDRODIURIL) 50 MG tablet 1/2 tablet (total 25mg ) daily      . levothyroxine (SYNTHROID, LEVOTHROID) 200 MCG tablet Take 200 mcg by mouth daily before breakfast.      . lisinopril (PRINIVIL,ZESTRIL) 10 MG tablet Take 10 mg by mouth daily with breakfast.       . LORazepam  (ATIVAN) 0.5 MG tablet Take 1 tablet (0.5 mg total) by mouth every 6 (six) hours as needed (Nausea or vomiting).  30 tablet  0  . lovastatin (ALTOPREV) 40 MG 24 hr tablet Take 40 mg by mouth at bedtime.      . metFORMIN (GLUCOPHAGE) 500 MG tablet Take 500 mg by mouth daily.      . montelukast (SINGULAIR) 10 MG tablet Take 10 mg by mouth at bedtime.      . naproxen sodium (ANAPROX) 220 MG tablet Take 220 mg by mouth daily as needed (pain).      . ondansetron (ZOFRAN) 8 MG tablet Take 1 tablet (8 mg total) by mouth 2 (two) times daily. Take two times a day starting the day after chemo for 3 days. Then take two times a day as needed for nausea or vomiting.  30 tablet  1  . potassium chloride (K-DUR,KLOR-CON) 10 MEQ tablet Take 10 mEq by mouth daily.       . prochlorperazine (COMPAZINE) 10 MG tablet Take 1 tablet (10 mg total) by mouth every 6 (six) hours as needed (Nausea or vomiting).  30 tablet  1  . sertraline (ZOLOFT) 100 MG tablet Take 100 mg by mouth daily with breakfast.       . carvedilol (COREG) 6.25 MG tablet Take 1 tablet (6.25 mg total) by mouth 2 (two) times daily.  60 tablet  11   No current facility-administered medications for this visit.    BP 118/62  Pulse 100  Ht 5\' 10"  (1.778 m)  Wt 102.967 kg (227 lb)  BMI 32.57 kg/m2 General: NAD, overweight Neck: No JVD, no thyromegaly or thyroid nodule.  Lungs: Clear to auscultation bilaterally with normal respiratory effort. CV: Nondisplaced PMI.  Heart regular S1/S2, no S3/S4, no murmur.  No peripheral edema.  No carotid bruit.  Normal pedal pulses.  Abdomen: Soft, nontender, no hepatosplenomegaly, no distention.  Skin: Intact without lesions or rashes.  Neurologic: Alert and oriented x 3.  Psych: Normal affect. Extremities: No clubbing or cyanosis.  HEENT: Normal.   Assessment/Plan: 1. Chemotherapy: Patient is going to start chemotherapy involving doxorubicin.  He had a baseline echo recently with low normal EF and grade I  diastolic dysfunction.  Given history of diabetes and hypertension, grade I diastolic dysfunction is not unexpected.   - I would have him get a repeat echo after the 4th cycle of ABVD then an echo after treatment is completed.  As long as echoes do not change and he does not develop new symptoms, I can see him in a year.   - Given history of HTN, I will have him start Coreg 6.25 mg bid and decrease HCTZ to 25 mg daily.  If BP is well-controlled on this regimen, may be able to stop HCTZ altogether. Continue lisinopril.  There is some evidence that Coreg may decrease risk of doxorubicin-induced cardiotoxicity.  2. CAD risk: Patient's father had premature CAD.  He has HTN, DM, and hyperlipidemia.  No ischemic symptoms.  He had lipids done at work.  He will send me the numbers via MyChart.  My goal for him would be LDL < 70.  Continue lovastatin.  3. HTN: BP reasonably controlled.  As above, will start Coreg and cut back on HCTZ.  4. Hepatic artery aneurysm: To see IR for consideration of treatment.   [name] 02/01/2013 5:57 PM

## 2013-02-01 NOTE — Patient Instructions (Addendum)
Decrease HCTZ(hydrochlorothiazide) to 25mg  daily. This will be 1/2 of your 50mg  tablet. Take and record your blood pressure. If your systolic blood pressure (top number) is in the 110-120 range after 2 weeks you can stop HCTZ.  Start coreg (carvedilol) 6.25mg  two times a day.  Your physician has requested that you have an echocardiogram. Echocardiography is a painless test that uses sound waves to create images of your heart. It provides your doctor with information about the size and shape of your heart and how well your heart's chambers and valves are working. This procedure takes approximately one hour. There are no restrictions for this procedure. SCHEDULE THIS AFTER YOUR 4TH CYCLE OF CHEMOTHERAPY  Your physician wants you to follow-up in: 1 year with Dr Aundra Dubin. (January 2016).  You will receive a reminder letter in the mail two months in advance. If you don't receive a letter, please call our office to schedule the follow-up appointment.

## 2013-02-01 NOTE — Telephone Encounter (Signed)
Per staff message and POF I have scheduled appts.  JMW  

## 2013-02-01 NOTE — Telephone Encounter (Signed)
Talked to pt he is aware of appt for lab,md and chemo for January and Febraury 2015

## 2013-02-02 ENCOUNTER — Other Ambulatory Visit (HOSPITAL_COMMUNITY): Payer: Self-pay | Admitting: Radiology

## 2013-02-02 ENCOUNTER — Other Ambulatory Visit (HOSPITAL_COMMUNITY): Payer: Self-pay | Admitting: Interventional Radiology

## 2013-02-02 ENCOUNTER — Telehealth: Payer: Self-pay | Admitting: Emergency Medicine

## 2013-02-02 ENCOUNTER — Telehealth (HOSPITAL_COMMUNITY): Payer: Self-pay | Admitting: Interventional Radiology

## 2013-02-02 ENCOUNTER — Ambulatory Visit
Admission: RE | Admit: 2013-02-02 | Discharge: 2013-02-02 | Disposition: A | Discharge status: Home / Self Care | Payer: PRIVATE HEALTH INSURANCE | Source: Ambulatory Visit | Attending: Internal Medicine | Admitting: Internal Medicine

## 2013-02-02 DIAGNOSIS — I728 Aneurysm of other specified arteries: Secondary | ICD-10-CM

## 2013-02-02 DIAGNOSIS — I729 Aneurysm of unspecified site: Secondary | ICD-10-CM

## 2013-02-02 NOTE — Telephone Encounter (Signed)
Called pt and gave him instructions for his procedure here at [facility] on 02/04/13. Pt understood and was in complete agreement with plan of care JM

## 2013-02-02 NOTE — Telephone Encounter (Signed)
CALLED JENNIFER W/ [facility] TO SCHEDULE THIS PT FOR 02-04-13 FOR HEPATIC ARTERY ARTERIOGRAM W/ COILING. SHE WILL CONTACT PT TO SET UP PROCEDURE.

## 2013-02-03 ENCOUNTER — Ambulatory Visit (HOSPITAL_COMMUNITY)
Admission: RE | Admit: 2013-02-03 | Discharge: 2013-02-03 | Disposition: A | Discharge status: Home / Self Care | Payer: PRIVATE HEALTH INSURANCE | Source: Ambulatory Visit | Attending: Internal Medicine | Admitting: Internal Medicine

## 2013-02-03 ENCOUNTER — Other Ambulatory Visit: Payer: Self-pay | Admitting: Internal Medicine

## 2013-02-03 ENCOUNTER — Encounter (HOSPITAL_COMMUNITY): Payer: Self-pay

## 2013-02-03 ENCOUNTER — Other Ambulatory Visit: Payer: Self-pay | Admitting: Radiology

## 2013-02-03 ENCOUNTER — Telehealth: Payer: Self-pay

## 2013-02-03 DIAGNOSIS — C8120 Mixed cellularity classical Hodgkin lymphoma, unspecified site: Secondary | ICD-10-CM

## 2013-02-03 DIAGNOSIS — I728 Aneurysm of other specified arteries: Secondary | ICD-10-CM | POA: Insufficient documentation

## 2013-02-03 DIAGNOSIS — Z85850 Personal history of malignant neoplasm of thyroid: Secondary | ICD-10-CM | POA: Insufficient documentation

## 2013-02-03 DIAGNOSIS — I10 Essential (primary) hypertension: Secondary | ICD-10-CM | POA: Insufficient documentation

## 2013-02-03 DIAGNOSIS — Z87891 Personal history of nicotine dependence: Secondary | ICD-10-CM | POA: Insufficient documentation

## 2013-02-03 DIAGNOSIS — R7309 Other abnormal glucose: Secondary | ICD-10-CM | POA: Insufficient documentation

## 2013-02-03 DIAGNOSIS — C8190 Hodgkin lymphoma, unspecified, unspecified site: Secondary | ICD-10-CM | POA: Insufficient documentation

## 2013-02-03 LAB — CBC WITH DIFFERENTIAL/PLATELET
BASOS ABS: 0 10*3/uL (ref 0.0–0.1)
Basophils Relative: 0 % (ref 0–1)
EOS ABS: 0.2 10*3/uL (ref 0.0–0.7)
EOS PCT: 2 % (ref 0–5)
HCT: 36.8 % — ABNORMAL LOW (ref 39.0–52.0)
HEMOGLOBIN: 12.5 g/dL — AB (ref 13.0–17.0)
Lymphocytes Relative: 17 % (ref 12–46)
Lymphs Abs: 1.8 10*3/uL (ref 0.7–4.0)
MCH: 29.8 pg (ref 26.0–34.0)
MCHC: 34 g/dL (ref 30.0–36.0)
MCV: 87.6 fL (ref 78.0–100.0)
Monocytes Absolute: 1 10*3/uL (ref 0.1–1.0)
Monocytes Relative: 10 % (ref 3–12)
Neutro Abs: 7.6 10*3/uL (ref 1.7–7.7)
Neutrophils Relative %: 72 % (ref 43–77)
PLATELETS: 337 10*3/uL (ref 150–400)
RBC: 4.2 MIL/uL — AB (ref 4.22–5.81)
RDW: 14.2 % (ref 11.5–15.5)
WBC: 10.6 10*3/uL — ABNORMAL HIGH (ref 4.0–10.5)

## 2013-02-03 LAB — PROTIME-INR
INR: 1.14 (ref 0.00–1.49)
Prothrombin Time: 14.4 seconds (ref 11.6–15.2)

## 2013-02-03 LAB — GLUCOSE, CAPILLARY
Glucose-Capillary: 130 mg/dL — ABNORMAL HIGH (ref 70–99)
Glucose-Capillary: 102 mg/dL — ABNORMAL HIGH (ref 70–99)

## 2013-02-03 LAB — APTT: aPTT: 40 seconds — ABNORMAL HIGH (ref 24–37)

## 2013-02-03 MED ORDER — FENTANYL CITRATE 0.05 MG/ML IJ SOLN
INTRAMUSCULAR | Status: AC | PRN
  Administered 2013-02-03: 50 ug via INTRAVENOUS
  Administered 2013-02-03: 100 ug via INTRAVENOUS

## 2013-02-03 MED ORDER — HEPARIN SOD (PORK) LOCK FLUSH 100 UNIT/ML IV SOLN: 500.0000 [IU] | Freq: Once | INTRAVENOUS | Status: DC

## 2013-02-03 MED ORDER — CEFAZOLIN SODIUM-DEXTROSE 2-3 GM-% IV SOLR
2.0000 g | Freq: Once | INTRAVENOUS | Status: AC
  Administered 2013-02-03: 2 g via INTRAVENOUS
  Filled 2013-02-03: qty 50

## 2013-02-03 MED ORDER — SODIUM CHLORIDE 0.9 % IV SOLN
INTRAVENOUS | Status: DC
  Administered 2013-02-03: 10:00:00 via INTRAVENOUS

## 2013-02-03 MED ORDER — LIDOCAINE HCL 1 % IJ SOLN
INTRAMUSCULAR | Status: AC
  Filled 2013-02-03: qty 20

## 2013-02-03 MED ORDER — LIDOCAINE-EPINEPHRINE (PF) 2 %-1:200000 IJ SOLN
INTRAMUSCULAR | Status: AC
  Filled 2013-02-03: qty 20

## 2013-02-03 MED ORDER — MIDAZOLAM HCL 2 MG/2ML IJ SOLN
INTRAMUSCULAR | Status: AC
  Filled 2013-02-03: qty 4

## 2013-02-03 MED ORDER — HEPARIN SOD (PORK) LOCK FLUSH 100 UNIT/ML IV SOLN
INTRAVENOUS | Status: AC
  Filled 2013-02-03: qty 5

## 2013-02-03 MED ORDER — FENTANYL CITRATE 0.05 MG/ML IJ SOLN
INTRAMUSCULAR | Status: AC
  Filled 2013-02-03: qty 4

## 2013-02-03 MED ORDER — MIDAZOLAM HCL 2 MG/2ML IJ SOLN
INTRAMUSCULAR | Status: AC | PRN
  Administered 2013-02-03: 2 mg via INTRAVENOUS
  Administered 2013-02-03: 1 mg via INTRAVENOUS

## 2013-02-03 NOTE — Telephone Encounter (Signed)
Cytogenetic report from [facility] received and forwarded to Dr Juliann Mule
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
## 2013-02-03 NOTE — Discharge Instructions (Signed)
Keep gauze clean and dry. Will be used tomorrow for your procedure. After gauze is removed call MD if any redness, drainage, swelling at site. Call for fever or increased pain.     Implanted [facility] Guide An implanted port is a type of central line that is placed under the skin. Central lines are used to provide IV access when treatment or nutrition needs to be given through a person's veins. Implanted ports are used for long-term IV access. An implanted port may be placed because:   You need IV medicine that would be irritating to the small veins in your hands or arms.   You need long-term IV medicines, such as antibiotics.   You need IV nutrition for a long period.   You need frequent blood draws for lab tests.   You need dialysis.  Implanted ports are usually placed in the chest area, but they can also be placed in the upper arm, the abdomen, or the leg. An implanted port has two main parts:   Reservoir. The reservoir is round and will appear as a small, raised area under your skin. The reservoir is the part where a needle is inserted to give medicines or draw blood.   Catheter. The catheter is a thin, flexible tube that extends from the reservoir. The catheter is placed into a large vein. Medicine that is inserted into the reservoir goes into the catheter and then into the vein.  HOW WILL I CARE FOR MY INCISION SITE? Do not get the incision site wet. Bathe or shower as directed by your health care provider.  HOW IS MY PORT ACCESSED? Special steps must be taken to access the port:   Before the port is accessed, a numbing cream can be placed on the skin. This helps numb the skin over the port site.   Your health care provider uses a sterile technique to access the port.  Your health care provider must put on a mask and sterile gloves.  The skin over your port is cleaned carefully with an antiseptic and allowed to dry.  The port is gently pinched between sterile gloves,  and a needle is inserted into the port.  Only "non-coring" port needles should be used to access the port. Once the port is accessed, a blood return should be checked. This helps ensure that the port is in the vein and is not clogged.   If your port needs to remain accessed for a constant infusion, a clear (transparent) bandage will be placed over the needle site. The bandage and needle will need to be changed every week, or as directed by your health care provider.   Keep the bandage covering the needle clean and dry. Do not get it wet. Follow your health care provider's instructions on how to take a shower or bath while the port is accessed.   If your port does not need to stay accessed, no bandage is needed over the port.  WHAT IS FLUSHING? Flushing helps keep the port from getting clogged. Follow your health care provider's instructions on how and when to flush the port. Ports are usually flushed with saline solution or a medicine called heparin. The need for flushing will depend on how the port is used.   If the port is used for intermittent medicines or blood draws, the port will need to be flushed:   After medicines have been given.   After blood has been drawn.   As part of routine maintenance.  If a constant infusion is running, the port may not need to be flushed.  HOW LONG WILL MY PORT STAY IMPLANTED? The port can stay in for as long as your health care provider thinks it is needed. When it is time for the port to come out, surgery will be done to remove it. The procedure is similar to the one performed when the port was put in.  WHEN SHOULD I SEEK IMMEDIATE MEDICAL CARE? When you have an implanted port, you should seek immediate medical care if:   You notice a bad smell coming from the incision site.   You have swelling, redness, or drainage at the incision site.   You have more swelling or pain at the port site or the surrounding area.   You have a fever that  is not controlled with medicine. Document Released: 12/30/2004 Document Revised: 10/20/2012 Document Reviewed: 09/06/2012 [facility] Patient Information 2014 [place]. Moderate Sedation, Adult Moderate sedation is given to help you relax or even sleep through a procedure. You may remain sleepy, be clumsy, or have poor balance for several hours following this procedure. Arrange for a responsible adult, family member, or friend to take you home. A responsible adult should stay with you for at least 24 hours or until the medicines have worn off.  Do not participate in any activities where you could become injured for the next 24 hours, or until you feel normal again. Do not:  Drive.  Swim.  Ride a bicycle.  Operate heavy machinery.  Cook.  Use power tools.  Climb ladders.  Work at [employer].  Do not make important decisions or sign legal documents until you are improved.  Vomiting may occur if you eat too soon. When you can drink without vomiting, try water, juice, or soup. Try solid foods if you feel little or no nausea.  Only take over-the-counter or prescription medications for pain, discomfort, or fever as directed by your caregiver.If pain medications have been prescribed for you, ask your caregiver how soon it is safe to take them.  Make sure you and your family fully understands everything about the medication given to you. Make sure you understand what side effects may occur.  You should not drink alcohol, take sleeping pills, or medications that cause drowsiness for at least 24 hours.  If you smoke, do not smoke alone.  If you are feeling better, you may resume normal activities 24 hours after receiving sedation.  Keep all appointments as scheduled. Follow all instructions.  Ask questions if you do not understand. SEEK MEDICAL CARE IF:   Your skin is pale or bluish in color.  You continue to feel sick to your stomach (nauseous) or throw up (vomit).  Your pain  is getting worse and not helped by medication.  You have bleeding or swelling.  You are still sleepy or feeling clumsy after 24 hours. SEEK IMMEDIATE MEDICAL CARE IF:   You develop a rash.  You have difficulty breathing.  You develop any type of allergic problem.  You have a fever. Document Released: 09/24/2000 Document Revised: 03/24/2011 Document Reviewed: 09/06/2012 [facility] Patient Information 2014 [place].

## 2013-02-03 NOTE — Procedures (Signed)
Interventional Radiology Procedure Note  Procedure: Placement of a right IJ approach single lumen PowerPort.  Tip is positioned at the superior cavoatrial junction and catheter is ready for immediate use.  Complications: No immediate Recommendations:  - Ok to shower tomorrow - Do not submerge for 7 days - Routine line care   Signed,  Jaylin Benzel K. Roald Lukacs, MD [facility]   

## 2013-02-04 ENCOUNTER — Encounter (HOSPITAL_COMMUNITY): Payer: Self-pay

## 2013-02-04 ENCOUNTER — Other Ambulatory Visit (HOSPITAL_COMMUNITY): Payer: Self-pay | Admitting: Radiology

## 2013-02-04 ENCOUNTER — Observation Stay (HOSPITAL_COMMUNITY)
Admission: RE | Admit: 2013-02-04 | Discharge: 2013-02-05 | Disposition: A | Discharge status: Home / Self Care | Payer: PRIVATE HEALTH INSURANCE | Source: Ambulatory Visit | Attending: Interventional Radiology | Admitting: Interventional Radiology

## 2013-02-04 VITALS — BP 119/79 | HR 84 | Temp 99.5°F | Resp 19 | Ht 70.0 in | Wt 228.0 lb

## 2013-02-04 DIAGNOSIS — Z87891 Personal history of nicotine dependence: Secondary | ICD-10-CM | POA: Insufficient documentation

## 2013-02-04 DIAGNOSIS — I729 Aneurysm of unspecified site: Secondary | ICD-10-CM

## 2013-02-04 DIAGNOSIS — R51 Headache: Secondary | ICD-10-CM | POA: Insufficient documentation

## 2013-02-04 DIAGNOSIS — I728 Aneurysm of other specified arteries: Principal | ICD-10-CM | POA: Diagnosis present

## 2013-02-04 DIAGNOSIS — E119 Type 2 diabetes mellitus without complications: Secondary | ICD-10-CM | POA: Insufficient documentation

## 2013-02-04 DIAGNOSIS — C8190 Hodgkin lymphoma, unspecified, unspecified site: Secondary | ICD-10-CM | POA: Insufficient documentation

## 2013-02-04 DIAGNOSIS — F3289 Other specified depressive episodes: Secondary | ICD-10-CM | POA: Insufficient documentation

## 2013-02-04 DIAGNOSIS — F329 Major depressive disorder, single episode, unspecified: Secondary | ICD-10-CM | POA: Insufficient documentation

## 2013-02-04 DIAGNOSIS — Z85850 Personal history of malignant neoplasm of thyroid: Secondary | ICD-10-CM | POA: Insufficient documentation

## 2013-02-04 DIAGNOSIS — I10 Essential (primary) hypertension: Secondary | ICD-10-CM | POA: Insufficient documentation

## 2013-02-04 LAB — COMPREHENSIVE METABOLIC PANEL
ALBUMIN: 2.9 g/dL — AB (ref 3.5–5.2)
ALT: 13 U/L (ref 0–53)
AST: 21 U/L (ref 0–37)
Alkaline Phosphatase: 101 U/L (ref 39–117)
BUN: 16 mg/dL (ref 6–23)
CALCIUM: 9.3 mg/dL (ref 8.4–10.5)
CO2: 25 mEq/L (ref 19–32)
CREATININE: 0.93 mg/dL (ref 0.50–1.35)
Chloride: 99 mEq/L (ref 96–112)
GFR calc Af Amer: >90 mL/min (ref >90)
GFR calc non Af Amer: >90 mL/min (ref >90)
Glucose, Bld: 121 mg/dL — ABNORMAL HIGH (ref 70–99)
POTASSIUM: 4.2 meq/L (ref 3.7–5.3)
Sodium: 138 mEq/L (ref 137–147)
Total Bilirubin: 0.3 mg/dL (ref 0.3–1.2)
Total Protein: 7.9 g/dL (ref 6.0–8.3)

## 2013-02-04 LAB — GLUCOSE, CAPILLARY
GLUCOSE-CAPILLARY: 127 mg/dL — AB (ref 70–99)
GLUCOSE-CAPILLARY: 119 mg/dL — AB (ref 70–99)

## 2013-02-04 LAB — TYPE AND SCREEN
ABO/RH(D): O POS
ANTIBODY SCREEN: NEGATIVE

## 2013-02-04 LAB — ABO/RH: ABO/RH(D): O POS

## 2013-02-04 LAB — CHROMOSOME ANALYSIS, BONE MARROW

## 2013-02-04 MED ORDER — INSULIN ASPART 100 UNIT/ML ~~LOC~~ SOLN
0.0000 [IU] | Freq: Every day | SUBCUTANEOUS | Status: DC
Start: 2013-02-04 — End: 2013-02-05

## 2013-02-04 MED ORDER — OXYCODONE HCL 5 MG PO TABS
5.0000 mg | ORAL_TABLET | ORAL | Status: DC | PRN
  Administered 2013-02-04: 5 mg via ORAL
  Filled 2013-02-04: qty 1

## 2013-02-04 MED ORDER — IOHEXOL 300 MG/ML  SOLN
150.0000 mL | Freq: Once | INTRAMUSCULAR | Status: AC | PRN
  Administered 2013-02-04: 120 mL via INTRAVENOUS

## 2013-02-04 MED ORDER — FENTANYL CITRATE 0.05 MG/ML IJ SOLN
INTRAMUSCULAR | Status: AC | PRN
  Administered 2013-02-04 (×4): 50 ug via INTRAVENOUS

## 2013-02-04 MED ORDER — MIDAZOLAM HCL 2 MG/2ML IJ SOLN
INTRAMUSCULAR | Status: AC
  Filled 2013-02-04: qty 6

## 2013-02-04 MED ORDER — CEFAZOLIN SODIUM-DEXTROSE 2-3 GM-% IV SOLR
2.0000 g | INTRAVENOUS | Status: AC
  Administered 2013-02-04: 2 g via INTRAVENOUS
  Filled 2013-02-04: qty 50

## 2013-02-04 MED ORDER — FENTANYL CITRATE 0.05 MG/ML IJ SOLN
INTRAMUSCULAR | Status: AC
  Filled 2013-02-04: qty 4

## 2013-02-04 MED ORDER — HEPARIN SODIUM (PORCINE) 1000 UNIT/ML IJ SOLN
INTRAMUSCULAR | Status: AC
  Filled 2013-02-04: qty 1

## 2013-02-04 MED ORDER — SODIUM CHLORIDE 0.9 % IV SOLN
INTRAVENOUS | Status: DC
  Administered 2013-02-04: 18:00:00 via INTRAVENOUS

## 2013-02-04 MED ORDER — MIDAZOLAM HCL 2 MG/2ML IJ SOLN
INTRAMUSCULAR | Status: AC | PRN
  Administered 2013-02-04 (×4): 1 mg via INTRAVENOUS

## 2013-02-04 MED ORDER — INSULIN ASPART 100 UNIT/ML ~~LOC~~ SOLN: 0.0000 [IU] | Freq: Three times a day (TID) | SUBCUTANEOUS | Status: DC

## 2013-02-04 MED ORDER — SODIUM CHLORIDE 0.9 % IV SOLN
INTRAVENOUS | Status: DC
Start: 2013-02-04 — End: 2013-02-04

## 2013-02-04 NOTE — ED Notes (Signed)
Patient denies pain and is resting comfortably.  

## 2013-02-04 NOTE — Procedures (Signed)
Interventional Radiology Procedure Note  Procedure: Hepatic arteriogram and combination embolization of giant hepatic artery aneurysm Access: Right common femoral 164F Closure device: 64F Mynx Ace Complications: None Recommendations: - Bedrest x 2 hrs, then may sit up - DC foley in 2 hrs - ADAT after 2 hours - Labs in am - Bath home in am  Signed,  Criselda Peaches, MD [facility]

## 2013-02-04 NOTE — H&P (Signed)
[name] [name] is an 48 y.o. male.   Chief Complaint: Upon evaluation and staging work up for new diagnosis of Hodgkins Lymphoma it was discovered that pt has an asymtomatic hepatic artery aneurysm Consulted with Dr [name] to discuss treatment options and pt now is scheduled for  hepatic artery aneurysm embolization. Port a Cath was placed yesterday in preparation of treatment for Hodgkins  HPI: HTN; DM; Hx thyroid ca; Hodgkins lymphoma  Past Medical History  Diagnosis Date  . Hypertension   . Diabetes mellitus without complication     Pre-diabetes  . Depression   . Headache(784.0)   . Thyroid disease     Hx: of  . Cancer 2015    cervical lymphadenopathy  . Hodgkin lymphoma 2015  . Hepatic artery aneurysm 2015    Past Surgical History  Procedure Laterality Date  . Tonsillectomy    . Mass excision Left 12/31/2012    Procedure: CERVICAL LYMPH NODE BIOPSY;  Surgeon: [name], MD;  Location: [facility];  Service: General;  Laterality: Left;    Family History  Problem Relation Age of Onset  . Aneurysm Mother   . Obesity Mother   . Hodgkin's lymphoma Father   . Diabetes Father   . Cancer Brother     unknown  . Heart disease Mother    Social History:  reports that he quit smoking about 7 months ago. His smoking use included Cigarettes. He smoked 0.00 packs per day. He has never used smokeless tobacco. He reports that he does not drink alcohol or use illicit drugs.  Allergies: No Known Allergies   (Not in a hospital admission)  Results for orders placed during the hospital encounter of 02/04/13 (from the past 48 hour(s))  GLUCOSE, CAPILLARY     Status: Abnormal   Collection Time    02/04/13 10:57 AM      Result Value Range   Glucose-Capillary 127 (*) 70 - 99 mg/dL   Comment 1 Documented in Chart     Comment 2 Notify RN     Ir Fluoro Guide Cv Line Right  02/03/2013   CLINICAL DATA:  48 year old male with recently diagnosed classical Hodgkin's lymphoma. He  requires durable central venous access for chemotherapy.  EXAM: IR RIGHT FLOURO GUIDE CV LINE; IR ULTRASOUND GUIDANCE VASC ACCESS RIGHT  Date: 02/03/2013  TECHNIQUE: The right neck and chest was prepped with chlorhexidine, and draped in the usual sterile fashion using maximum barrier technique (cap and mask, sterile gown, sterile gloves, large sterile sheet, hand hygiene and cutaneous antiseptic). Antibiotic prophylaxis was provided with 2g Ancef administered IV one hour prior to skin incision. Local anesthesia was attained by infiltration with 1% lidocaine with epinephrine.  Ultrasound demonstrated patency of the right internal jugular vein, and this was documented with an image. Under real-time ultrasound guidance, this vein was accessed with a 21 gauge micropuncture needle and image documentation was performed. A small dermatotomy was made at the access site with an 11 scalpel. A 0.018" wire was advanced into the SVC and the access needle exchanged for a 7F micropuncture vascular sheath. The 0.018" wire was then removed and a 0.035" wire advanced into the IVC.  An appropriate location for the subcutaneous reservoir was selected below the clavicle and an incision was made through the skin and underlying soft tissues. The subcutaneous tissues were then dissected using a combination of blunt and sharp surgical technique and a pocket was formed. A single lumen power injectable portacatheter was then tunneled through  the subcutaneous tissues from the pocket to the dermatotomy and the port reservoir placed within the subcutaneous pocket.  The venous access site was then serially dilated and a peel away vascular sheath placed over the wire. The wire was removed and the port catheter advanced into position under fluoroscopic guidance. The catheter tip is positioned in the upper right atrium. This was documented with a spot image. The portacatheter was then tested and found to flush and aspirate well. The port was flushed  with saline followed by 100 units/mL heparinized saline.  The pocket was then closed in two layers using first subdermal inverted interrupted absorbable sutures followed by a running subcuticular suture. The epidermis was then sealed with Dermabond. The dermatotomy at the venous access site was also closed with a single inverted subdermal suture and the epidermis sealed with Dermabond.  ANESTHESIA/SEDATION: Moderate (conscious) sedation was administered during this procedure. A total of 3 mg Versed and 150 mg Fentanyl were administered intravenously. The patient's vital signs were monitored continuously by radiology nursing throughout the course of the procedure.  Total sedation time: 25 minutes  FLUOROSCOPY TIME:  6 seconds  COMPLICATIONS: None.  The patient tolerated the procedure well.  IMPRESSION: Successful placement of a right IJ approach Power Port with ultrasound and fluoroscopic guidance. The catheter is ready for use.  Signed,  Criselda Peaches, MD  [facility]  [facility]   Electronically Signed   By: [name] M.D.   On: 02/03/2013 17:32   Ir US Guide Vasc Access Right  02/03/2013   CLINICAL DATA:  48 year old male with recently diagnosed classical Hodgkin's lymphoma. He requires durable central venous access for chemotherapy.  EXAM: IR RIGHT FLOURO GUIDE CV LINE; IR ULTRASOUND GUIDANCE VASC ACCESS RIGHT  Date: 02/03/2013  TECHNIQUE: The right neck and chest was prepped with chlorhexidine, and draped in the usual sterile fashion using maximum barrier technique (cap and mask, sterile gown, sterile gloves, large sterile sheet, hand hygiene and cutaneous antiseptic). Antibiotic prophylaxis was provided with 2g Ancef administered IV one hour prior to skin incision. Local anesthesia was attained by infiltration with 1% lidocaine with epinephrine.  Ultrasound demonstrated patency of the right internal jugular vein, and this was documented with an  image. Under real-time ultrasound guidance, this vein was accessed with a 21 gauge micropuncture needle and image documentation was performed. A small dermatotomy was made at the access site with an 11 scalpel. A 0.018" wire was advanced into the SVC and the access needle exchanged for a 24F micropuncture vascular sheath. The 0.018" wire was then removed and a 0.035" wire advanced into the IVC.  An appropriate location for the subcutaneous reservoir was selected below the clavicle and an incision was made through the skin and underlying soft tissues. The subcutaneous tissues were then dissected using a combination of blunt and sharp surgical technique and a pocket was formed. A single lumen power injectable portacatheter was then tunneled through the subcutaneous tissues from the pocket to the dermatotomy and the port reservoir placed within the subcutaneous pocket.  The venous access site was then serially dilated and a peel away vascular sheath placed over the wire. The wire was removed and the port catheter advanced into position under fluoroscopic guidance. The catheter tip is positioned in the upper right atrium. This was documented with a spot image. The portacatheter was then tested and found to flush and aspirate well. The port was flushed with saline followed by 100 units/mL heparinized saline.  The pocket was then closed in two layers using first subdermal inverted interrupted absorbable sutures followed by a running subcuticular suture. The epidermis was then sealed with Dermabond. The dermatotomy at the venous access site was also closed with a single inverted subdermal suture and the epidermis sealed with Dermabond.  ANESTHESIA/SEDATION: Moderate (conscious) sedation was administered during this procedure. A total of 3 mg Versed and 150 mg Fentanyl were administered intravenously. The patient's vital signs were monitored continuously by radiology nursing throughout the course of the procedure.  Total  sedation time: 25 minutes  FLUOROSCOPY TIME:  6 seconds  COMPLICATIONS: None.  The patient tolerated the procedure well.  IMPRESSION: Successful placement of a right IJ approach Power Port with ultrasound and fluoroscopic guidance. The catheter is ready for use.  Signed,  Criselda Peaches, MD  [facility]  [facility]   Electronically Signed   By: [name] M.D.   On: 02/03/2013 17:32    Review of Systems  Constitutional: Negative for fever.  Respiratory: Negative for cough and shortness of breath.   Cardiovascular: Positive for chest pain.       PAC site painful (rt anterior chest)  Gastrointestinal: Negative for nausea, vomiting and abdominal pain.  Neurological: Negative for dizziness, weakness and headaches.  Psychiatric/Behavioral: Negative for substance abuse.    Blood pressure 129/83, pulse 98, temperature 97.1 F (36.2 C), temperature source Oral, resp. rate 18, height 5\' 10"  (1.778 m), weight 227 lb (102.967 kg), SpO2 95.00%. Physical Exam  Constitutional: He is oriented to person, place, and time. He appears well-developed and well-nourished.  Cardiovascular: Normal rate and regular rhythm.   No murmur heard. Respiratory: Effort normal and breath sounds normal. He has no wheezes.  Rt anterior chest  PAC site sl tender; no bleeding  GI: Soft. Bowel sounds are normal.  Musculoskeletal: Normal range of motion.  Neurological: He is alert and oriented to person, place, and time.  Skin: Skin is warm and dry.  Psychiatric: He has a normal mood and affect. His behavior is normal. Judgment and thought content normal.     Assessment/Plan Hepatic artery aneurysm discovered coincidentally during staging work up for [place] with Dr Laurence Ferrari to discuss treatment options Pt now scheduled for aneurysm embolization Pt aware of procedure benefits and risks and agreeable to proceed Consent signed and in chart Pt is  aware he will be admitted overnight for observation Plan for dc in am   Yee Gangi A 02/04/2013, 11:27 AM

## 2013-02-05 ENCOUNTER — Other Ambulatory Visit: Payer: Self-pay | Admitting: Radiology

## 2013-02-05 DIAGNOSIS — I728 Aneurysm of other specified arteries: Secondary | ICD-10-CM

## 2013-02-05 LAB — COMPREHENSIVE METABOLIC PANEL
ALT: 12 U/L (ref 0–53)
AST: 24 U/L (ref 0–37)
Albumin: 2.8 g/dL — ABNORMAL LOW (ref 3.5–5.2)
Alkaline Phosphatase: 97 U/L (ref 39–117)
BUN: 14 mg/dL (ref 6–23)
CHLORIDE: 98 meq/L (ref 96–112)
CO2: 28 meq/L (ref 19–32)
CREATININE: 0.99 mg/dL (ref 0.50–1.35)
Calcium: 9 mg/dL (ref 8.4–10.5)
GFR calc Af Amer: >90 mL/min (ref >90)
Glucose, Bld: 107 mg/dL — ABNORMAL HIGH (ref 70–99)
Potassium: 4.1 mEq/L (ref 3.7–5.3)
Sodium: 137 mEq/L (ref 137–147)
Total Bilirubin: 0.3 mg/dL (ref 0.3–1.2)
Total Protein: 7.8 g/dL (ref 6.0–8.3)

## 2013-02-05 LAB — GLUCOSE, CAPILLARY
GLUCOSE-CAPILLARY: 136 mg/dL — AB (ref 70–99)
Glucose-Capillary: 120 mg/dL — ABNORMAL HIGH (ref 70–99)

## 2013-02-05 NOTE — Progress Notes (Signed)
Discharge instructions reviewed with pt; allowed pt to ask questions if he had them. Pt verbalized understanding of instructions. IV removed. Pt discharged home; pt ambulated off unit with company of RN. Education completed.

## 2013-02-05 NOTE — Discharge Summary (Signed)
Physician Discharge Summary  Patient ID: [name] [name] MRN: [id number] DOB/AGE: Apr 04, 1965 48 y.o.  Admit date: 02/04/2013 Discharge date: 02/05/2013  Admission Diagnoses: Principal Problem:   Aneurysm artery, hepatic  Discharge Diagnoses:  Principal Problem:   Aneurysm artery, hepatic    Procedures: S/p Hepatic arteriogram with combo embolization of hepatic artery aneurysm.  Discharged Condition: good  Hospital Course: HPI: Upon evaluation and staging work up for new diagnosis of Hodgkins Lymphoma it was discovered that pt has an asymtomatic hepatic artery aneurysm  Consulted with Dr [name] to discuss treatment options and pt now is scheduled for  hepatic artery aneurysm embolization.  Pt brought to IR suite on 1/23 and underwent successful hepatic arteriogram with combo embolization of hepatic artery aneurysm. No complications during procedure. Admitted to floor in stable condition. No events overnight. Pain is minimal. Has tolerated regular diet and has been ambulating well. Voiding well after foley removed. Seen by Dr. Laurence Ferrari, stable for discharge All medications and instructions reviewed.  Consults: None   Discharge Exam: Blood pressure 119/79, pulse 84, temperature 99.5 F (37.5 C), temperature source Oral, resp. rate 19, height 5\' 10"  (1.778 m), weight 228 lb (103.42 kg), SpO2 94.00%. Lungs: CTA without w/r/r Heart: Regular Abdomen:soft, ND, NT Ext: rt groin soft, no hematoma, 2+ pedal pulses   Disposition: 01-Home or Self Care      Discharge Orders   Future Appointments Provider Department Dept Phone   02/07/2013 10:00 AM [facility] [phone]   02/07/2013 11:30 AM [facility] [phone]   02/11/2013 11:00 AM [facility] [phone]   02/11/2013 11:30 AM [facility] [place] [facility] [phone]   02/11/2013 12:30 PM [facility] [phone]   02/25/2013 12:45 PM [facility] [phone]   03/28/2013 11:00 AM [facility] P Powell, [place] [facility] [phone]   03/28/2013 12:00 PM [facility] [phone]   Future Orders Complete By Expires   Call MD for:  difficulty breathing, headache or visual disturbances  As directed    Call MD for:  persistant nausea and vomiting  As directed    Call MD for:  redness, tenderness, or signs of infection (pain, swelling, redness, odor or green/yellow discharge around incision site)  As directed    Call MD for:  severe uncontrolled pain  As directed    Call MD for:  temperature >100.4  As directed    Diet - low sodium heart healthy  As directed    Increase activity slowly  As directed    May shower / Bathe  As directed    May walk up steps  As directed    Remove dressing in 24 hours  As directed        Medication List         carvedilol 6.25 MG tablet  Commonly known as:  COREG  Take 1 tablet (6.25 mg total) by mouth 2 (two) times daily.     dexamethasone 4 MG tablet  Commonly known as:  DECADRON  Take 2 tablets (8 mg total) by mouth 2 (two) times daily with a meal. Take two times a day starting the day after chemotherapy for 3 days.     DYMISTA 137-50 MCG/ACT Susp  Generic drug:  Azelastine-Fluticasone  Place 2 sprays into the nose 2 (two) times daily.     fexofenadine 180 MG tablet  Commonly known as:  ALLEGRA  Take 180 mg by mouth daily.     hydrochlorothiazide 50 MG tablet  Commonly known as:  HYDRODIURIL  1/2 tablet (total 25mg ) daily     levothyroxine 200 MCG tablet  Commonly known as:  SYNTHROID, LEVOTHROID  Take 200 mcg by mouth daily before breakfast.     lisinopril 10 MG tablet  Commonly known as:   PRINIVIL,ZESTRIL  Take 10 mg by mouth daily with breakfast.     LORazepam 0.5 MG tablet  Commonly known as:  ATIVAN  Take 1 tablet (0.5 mg total) by mouth every 6 (six) hours as needed (Nausea or vomiting).     lovastatin 40 MG 24 hr tablet  Commonly known as:  ALTOPREV  Take 40 mg by mouth at bedtime.     metFORMIN 500 MG tablet  Commonly known as:  GLUCOPHAGE  Take 500 mg by mouth daily.     montelukast 10 MG tablet  Commonly known as:  SINGULAIR  Take 10 mg by mouth at bedtime.     naproxen sodium 220 MG tablet  Commonly known as:  ANAPROX  Take 220 mg by mouth daily as needed (pain).     ondansetron 8 MG tablet  Commonly known as:  ZOFRAN  Take 1 tablet (8 mg total) by mouth 2 (two) times daily. Take two times a day starting the day after chemo for 3 days. Then take two times a day as needed for nausea or vomiting.     potassium chloride 10 MEQ tablet  Commonly known as:  K-DUR,KLOR-CON  Take 10 mEq by mouth daily.     prochlorperazine 10 MG tablet  Commonly known as:  COMPAZINE  Take 1 tablet (10 mg total) by mouth every 6 (six) hours as needed (Nausea or vomiting).     sertraline 100 MG tablet  Commonly known as:  ZOLOFT  Take 100 mg by mouth daily with breakfast.       Follow-up Information   Follow up with [facility], HEATH K, MD. Schedule an appointment as soon as possible for a visit in 4 weeks. (Tammy from clinic will call. You can call her with questions/concerns at [phone])    Specialty:  Interventional Radiology   Contact information:   [address]  [place] [address] [phone]       Signed: Ascencion Dike PA-C 02/05/2013, 9:38 AM

## 2013-02-05 NOTE — Progress Notes (Signed)
Interventional Radiology Progress Note  HPI: 48 yo male with a giant hepatic artery aneurysm now POD#1 s/p complex coil, plug and liquid embolic embolization.  Subjective: Doing very well, no active complaints.  Denies pain, nausea, vomiting or fever.  Objective:  Filed Vitals:   02/05/13 0414  BP: 115/77  Pulse: 87  Temp: 98.9 F (37.2 C)  Resp: 18    Abd: soft, NT/ND Right groin: Soft, NT, no hematoma.   Assessment / Plan  Doing very well s/p complex embolization of giant hepatic artery aneurysm.  No evidence of liver injury.  - DC home - Follow up in clinic in 4 wks with CTA abdomen  Signed,  Criselda Peaches, MD [facility]

## 2013-02-07 ENCOUNTER — Encounter: Payer: Self-pay | Admitting: Medical Oncology

## 2013-02-07 ENCOUNTER — Other Ambulatory Visit: Payer: Self-pay | Admitting: Medical Oncology

## 2013-02-07 ENCOUNTER — Other Ambulatory Visit: Payer: PRIVATE HEALTH INSURANCE

## 2013-02-07 ENCOUNTER — Ambulatory Visit (HOSPITAL_COMMUNITY)
Admission: RE | Admit: 2013-02-07 | Discharge: 2013-02-07 | Disposition: A | Discharge status: Home / Self Care | Payer: PRIVATE HEALTH INSURANCE | Source: Ambulatory Visit | Attending: Internal Medicine | Admitting: Internal Medicine

## 2013-02-07 DIAGNOSIS — Z87891 Personal history of nicotine dependence: Secondary | ICD-10-CM | POA: Insufficient documentation

## 2013-02-07 DIAGNOSIS — R0989 Other specified symptoms and signs involving the circulatory and respiratory systems: Secondary | ICD-10-CM | POA: Insufficient documentation

## 2013-02-07 DIAGNOSIS — C8120 Mixed cellularity classical Hodgkin lymphoma, unspecified site: Secondary | ICD-10-CM

## 2013-02-07 DIAGNOSIS — R0609 Other forms of dyspnea: Secondary | ICD-10-CM | POA: Insufficient documentation

## 2013-02-07 DIAGNOSIS — C8129 Mixed cellularity classical Hodgkin lymphoma, extranodal and solid organ sites: Secondary | ICD-10-CM | POA: Insufficient documentation

## 2013-02-07 LAB — PULMONARY FUNCTION TEST
DL/VA % pred: 101 %
DL/VA: 4.68 ml/min/mmHg/L
DLCO COR % PRED: 93 %
DLCO COR: 29.11 ml/min/mmHg
DLCO unc % pred: 87 %
DLCO unc: 27.22 ml/min/mmHg
FEF 25-75 PRE: 3.13 L/s
FEF 25-75 Post: 3.69 L/sec
FEF2575-%CHANGE-POST: 18 %
FEF2575-%PRED-POST: 104 %
FEF2575-%Pred-Pre: 88 %
FEV1-%CHANGE-POST: 3 %
FEV1-%PRED-POST: 91 %
FEV1-%Pred-Pre: 87 %
FEV1-PRE: 3.41 L
FEV1-Post: 3.54 L
FEV1FVC-%Change-Post: 1 %
FEV1FVC-%PRED-PRE: 103 %
FEV6-%Change-Post: 1 %
FEV6-%PRED-POST: 89 %
FEV6-%Pred-Pre: 88 %
FEV6-Post: 4.32 L
FEV6-Pre: 4.24 L
FEV6FVC-%Change-Post: 0 %
FEV6FVC-%PRED-PRE: 102 %
FEV6FVC-%Pred-Post: 103 %
FVC-%Change-Post: 2 %
FVC-%Pred-Post: 87 %
FVC-%Pred-Pre: 85 %
FVC-POST: 4.33 L
FVC-Pre: 4.24 L
Post FEV1/FVC ratio: 82 %
Post FEV6/FVC ratio: 100 %
Pre FEV1/FVC ratio: 80 %
Pre FEV6/FVC Ratio: 100 %
RV % pred: 114 %
RV: 2.19 L
TLC % pred: 96 %
TLC: 6.52 L

## 2013-02-07 MED ORDER — LIDOCAINE-PRILOCAINE 2.5-2.5 % EX CREA: 1.0000 "application " | TOPICAL_CREAM | CUTANEOUS | Status: DC | PRN

## 2013-02-07 MED ORDER — ALBUTEROL SULFATE (2.5 MG/3ML) 0.083% IN NEBU
2.5000 mg | INHALATION_SOLUTION | Freq: Once | RESPIRATORY_TRACT | Status: AC
  Administered 2013-02-07: 2.5 mg via RESPIRATORY_TRACT

## 2013-02-08 ENCOUNTER — Ambulatory Visit: Payer: PRIVATE HEALTH INSURANCE | Admitting: Cardiovascular Disease

## 2013-02-08 ENCOUNTER — Encounter (HOSPITAL_COMMUNITY): Payer: Self-pay

## 2013-02-08 ENCOUNTER — Encounter (HOSPITAL_COMMUNITY): Payer: PRIVATE HEALTH INSURANCE

## 2013-02-10 ENCOUNTER — Other Ambulatory Visit: Payer: Self-pay | Admitting: Internal Medicine

## 2013-02-10 ENCOUNTER — Telehealth: Payer: Self-pay | Admitting: Internal Medicine

## 2013-02-10 ENCOUNTER — Other Ambulatory Visit (HOSPITAL_COMMUNITY): Payer: Self-pay | Admitting: Interventional Radiology

## 2013-02-10 DIAGNOSIS — I728 Aneurysm of other specified arteries: Secondary | ICD-10-CM

## 2013-02-10 DIAGNOSIS — C8120 Mixed cellularity classical Hodgkin lymphoma, unspecified site: Secondary | ICD-10-CM

## 2013-02-10 NOTE — Telephone Encounter (Signed)
Patient doing well.  He denies abdominal pain.  He reports that he received his portacath without difficulty.  We discussed fertility and he states that he is not concerned about that.   He was agreeable to proceed with chemotherapy.  He requests pain medication for his back.  We will provide oxycodone 5 mg prn, allopurinol if needed and emla cream tomorrow's visit.

## 2013-02-11 ENCOUNTER — Telehealth: Payer: Self-pay | Admitting: Internal Medicine

## 2013-02-11 ENCOUNTER — Ambulatory Visit (HOSPITAL_BASED_OUTPATIENT_CLINIC_OR_DEPARTMENT_OTHER): Payer: PRIVATE HEALTH INSURANCE | Admitting: Internal Medicine

## 2013-02-11 ENCOUNTER — Encounter: Payer: Self-pay | Admitting: Internal Medicine

## 2013-02-11 ENCOUNTER — Ambulatory Visit (HOSPITAL_BASED_OUTPATIENT_CLINIC_OR_DEPARTMENT_OTHER): Payer: PRIVATE HEALTH INSURANCE

## 2013-02-11 ENCOUNTER — Other Ambulatory Visit (HOSPITAL_COMMUNITY): Payer: Self-pay | Admitting: Interventional Radiology

## 2013-02-11 ENCOUNTER — Other Ambulatory Visit (HOSPITAL_BASED_OUTPATIENT_CLINIC_OR_DEPARTMENT_OTHER): Payer: PRIVATE HEALTH INSURANCE

## 2013-02-11 VITALS — BP 111/76 | HR 79 | Temp 97.7°F | Resp 20 | Ht 70.0 in | Wt 226.9 lb

## 2013-02-11 DIAGNOSIS — C8128 Mixed cellularity classical Hodgkin lymphoma, lymph nodes of multiple sites: Secondary | ICD-10-CM

## 2013-02-11 DIAGNOSIS — I729 Aneurysm of unspecified site: Secondary | ICD-10-CM

## 2013-02-11 DIAGNOSIS — C8120 Mixed cellularity classical Hodgkin lymphoma, unspecified site: Secondary | ICD-10-CM

## 2013-02-11 DIAGNOSIS — E079 Disorder of thyroid, unspecified: Secondary | ICD-10-CM

## 2013-02-11 DIAGNOSIS — E785 Hyperlipidemia, unspecified: Secondary | ICD-10-CM

## 2013-02-11 DIAGNOSIS — E669 Obesity, unspecified: Secondary | ICD-10-CM

## 2013-02-11 DIAGNOSIS — I519 Heart disease, unspecified: Secondary | ICD-10-CM

## 2013-02-11 DIAGNOSIS — Z5111 Encounter for antineoplastic chemotherapy: Secondary | ICD-10-CM

## 2013-02-11 DIAGNOSIS — I10 Essential (primary) hypertension: Secondary | ICD-10-CM

## 2013-02-11 DIAGNOSIS — E109 Type 1 diabetes mellitus without complications: Secondary | ICD-10-CM

## 2013-02-11 DIAGNOSIS — E0789 Other specified disorders of thyroid: Secondary | ICD-10-CM

## 2013-02-11 DIAGNOSIS — I2510 Atherosclerotic heart disease of native coronary artery without angina pectoris: Secondary | ICD-10-CM

## 2013-02-11 DIAGNOSIS — E039 Hypothyroidism, unspecified: Secondary | ICD-10-CM

## 2013-02-11 DIAGNOSIS — D649 Anemia, unspecified: Secondary | ICD-10-CM

## 2013-02-11 LAB — CBC WITH DIFFERENTIAL/PLATELET
BASO%: 0.6 % (ref 0.0–2.0)
Basophils Absolute: 0.1 10*3/uL (ref 0.0–0.1)
EOS%: 1 % (ref 0.0–7.0)
Eosinophils Absolute: 0.1 10*3/uL (ref 0.0–0.5)
HCT: 36.1 % — ABNORMAL LOW (ref 38.4–49.9)
HGB: 12.4 g/dL — ABNORMAL LOW (ref 13.0–17.1)
LYMPH#: 2.1 10*3/uL (ref 0.9–3.3)
LYMPH%: 19.8 % (ref 14.0–49.0)
MCH: 30.8 pg (ref 27.2–33.4)
MCHC: 34.5 g/dL (ref 32.0–36.0)
MCV: 89.3 fL (ref 79.3–98.0)
MONO#: 1.1 10*3/uL — AB (ref 0.1–0.9)
MONO%: 11 % (ref 0.0–14.0)
NEUT#: 7.1 10*3/uL — ABNORMAL HIGH (ref 1.5–6.5)
NEUT%: 67.6 % (ref 39.0–75.0)
Platelets: 474 10*3/uL — ABNORMAL HIGH (ref 140–400)
RBC: 4.04 10*6/uL — AB (ref 4.20–5.82)
RDW: 14.7 % — ABNORMAL HIGH (ref 11.0–14.6)
WBC: 10.4 10*3/uL — AB (ref 4.0–10.3)

## 2013-02-11 LAB — COMPREHENSIVE METABOLIC PANEL (CC13)
ALBUMIN: 3 g/dL — AB (ref 3.5–5.0)
ALT: 15 U/L (ref 0–55)
AST: 22 U/L (ref 5–34)
Alkaline Phosphatase: 99 U/L (ref 40–150)
Anion Gap: 10 mEq/L (ref 3–11)
BUN: 15.2 mg/dL (ref 7.0–26.0)
CALCIUM: 9.8 mg/dL (ref 8.4–10.4)
CHLORIDE: 104 meq/L (ref 98–109)
CO2: 22 mEq/L (ref 22–29)
Creatinine: 0.8 mg/dL (ref 0.7–1.3)
Glucose: 114 mg/dl (ref 70–140)
POTASSIUM: 4.4 meq/L (ref 3.5–5.1)
SODIUM: 136 meq/L (ref 136–145)
Total Bilirubin: 0.29 mg/dL (ref 0.20–1.20)
Total Protein: 8.4 g/dL — ABNORMAL HIGH (ref 6.4–8.3)

## 2013-02-11 LAB — TSH CHCC: TSH: 9.659 m[IU]/L — AB (ref 0.320–4.118)

## 2013-02-11 LAB — T4, FREE: Free T4: 1.31 ng/dL (ref 0.80–1.80)

## 2013-02-11 MED ORDER — OXYCODONE HCL 5 MG PO TABS: 5.0000 mg | ORAL_TABLET | Freq: Four times a day (QID) | ORAL | Status: DC | PRN

## 2013-02-11 MED ORDER — SODIUM CHLORIDE 0.9 % IV SOLN
375.0000 mg/m2 | Freq: Once | INTRAVENOUS | Status: AC
  Administered 2013-02-11: 820 mg via INTRAVENOUS
  Filled 2013-02-11: qty 41

## 2013-02-11 MED ORDER — HEPARIN SOD (PORK) LOCK FLUSH 100 UNIT/ML IV SOLN
500.0000 [IU] | Freq: Once | INTRAVENOUS | Status: AC | PRN
  Administered 2013-02-11: 500 [IU]
  Filled 2013-02-11: qty 5

## 2013-02-11 MED ORDER — DEXAMETHASONE SODIUM PHOSPHATE 20 MG/5ML IJ SOLN
20.0000 mg | Freq: Once | INTRAMUSCULAR | Status: AC
  Administered 2013-02-11: 20 mg via INTRAVENOUS

## 2013-02-11 MED ORDER — VINBLASTINE SULFATE CHEMO INJECTION 1 MG/ML
13.0000 mg | Freq: Once | INTRAVENOUS | Status: AC
  Administered 2013-02-11: 13 mg via INTRAVENOUS
  Filled 2013-02-11: qty 13

## 2013-02-11 MED ORDER — ONDANSETRON 16 MG/50ML IVPB (CHCC)
16.0000 mg | Freq: Once | INTRAVENOUS | Status: AC
  Administered 2013-02-11: 16 mg via INTRAVENOUS

## 2013-02-11 MED ORDER — SODIUM CHLORIDE 0.9 % IV SOLN
Freq: Once | INTRAVENOUS | Status: AC
  Administered 2013-02-11: 12:00:00 via INTRAVENOUS

## 2013-02-11 MED ORDER — SODIUM CHLORIDE 0.9 % IV SOLN
10.0000 [IU]/m2 | Freq: Once | INTRAVENOUS | Status: AC
  Administered 2013-02-11: 22 [IU] via INTRAVENOUS
  Filled 2013-02-11: qty 7.33

## 2013-02-11 MED ORDER — DOXORUBICIN HCL CHEMO IV INJECTION 2 MG/ML
25.0000 mg/m2 | Freq: Once | INTRAVENOUS | Status: AC
  Administered 2013-02-11: 54 mg via INTRAVENOUS
  Filled 2013-02-11: qty 27

## 2013-02-11 MED ORDER — DEXAMETHASONE SODIUM PHOSPHATE 20 MG/5ML IJ SOLN
INTRAMUSCULAR | Status: AC
  Filled 2013-02-11: qty 5

## 2013-02-11 MED ORDER — SODIUM CHLORIDE 0.9 % IJ SOLN
10.0000 mL | INTRAMUSCULAR | Status: DC | PRN
  Administered 2013-02-11: 10 mL
  Filled 2013-02-11: qty 10

## 2013-02-11 MED ORDER — ONDANSETRON 16 MG/50ML IVPB (CHCC)
INTRAVENOUS | Status: AC
  Filled 2013-02-11: qty 16

## 2013-02-11 NOTE — Patient Instructions (Signed)
[name] [name] Discharge Instructions for Patients Receiving Chemotherapy  Today you received the following chemotherapy agents Adriamycin, Vinblastine, Bleocin and Dacarbazine.   To help prevent nausea and vomiting after your treatment, we encourage you to take your nausea medication as prescribed.   If you develop nausea and vomiting that is not controlled by your nausea medication, call the clinic.   BELOW ARE SYMPTOMS THAT SHOULD BE REPORTED IMMEDIATELY:  *FEVER GREATER THAN 100.5 F  *CHILLS WITH OR WITHOUT FEVER  NAUSEA AND VOMITING THAT IS NOT CONTROLLED WITH YOUR NAUSEA MEDICATION  *UNUSUAL SHORTNESS OF BREATH  *UNUSUAL BRUISING OR BLEEDING  TENDERNESS IN MOUTH AND THROAT WITH OR WITHOUT PRESENCE OF ULCERS  *URINARY PROBLEMS  *BOWEL PROBLEMS  UNUSUAL RASH Items with * indicate a potential emergency and should be followed up as soon as possible.  Feel free to call the clinic you have any questions or concerns. The clinic phone number is [phone].

## 2013-02-11 NOTE — Progress Notes (Signed)
[facility] OFFICE PROGRESS NOTE  PCP: [name], MD  DIAGNOSIS: Hodgkin's disease with mixed cellularity - Plan: oxyCODONE (OXY IR/ROXICODONE) 5 MG immediate release tablet, CBC with Differential, Comprehensive metabolic panel (Cmet) - [facility], Lactate dehydrogenase (LDH) - [facility], CBC with Differential, Lactate dehydrogenase (LDH) - [facility], Comprehensive metabolic panel (Cmet) - [facility], CBC with Differential, CBC with Differential  Chief Complaint  Patient presents with  . Lymphoma   CURRENT THERAPY: ABVD planned to start cycle #1A, day #1 on 02/11/2013 ; day # 15, cycle 1B on 02/25/2013.   INTERVAL HISTORY: [name] [name] 48 y.o. male with newly diagnosed Classical Hodgkin's disease of mixed cellularity based on pathology of left supraclavicular biospy on 12/19.     We last saw him on 01/28/2013 at the request of Dr. Rosendo Gros for lymphadenopathy for follow-up.  He underwent a CT scan of the neck (12/03/2012) that showed lymphadenopathy left cervical chain with right-sided axillary lymphadenopathy. He does report that prior to evaluation by Dr. Rosendo Gros and subquent excisional biopsy of Left cervical/supraclavicular lymphadenopathy on 12/22/2012 he had increased swell underneath his right armpit. This was associated with right hand tingling and pain. Pathology as noted above.  His pathology was consistent with classical Hodgkin's disease of mixed celluarity.   During his initial visit, we requested that he had staging imaging including a PET/CT, bone marrow biopsy and echocardiogram. CT chest done on on 01/12 revealed the following:  1) enlarged mediastinal, right hilar, internal mammary, left supraclavicular and of bilateral axillary lymph nodes compatible with lymphoma; 2) small nonspecific pulmonary nodules are identified which measure up to 4 mm. Attention on followup imaging is advised; 3) Coronary artery calcifications. CT abdomen and pelvis revealed the following:  1) Extensive  upper abdominal and pelvic adenopathy compatible with lymphoma. 2) Aneurysm involves the left hepatic artery. PET on 01/12 revealed hypermetabolic adenopathy within  within the neck, chest, abdomen and pelvis compatible with the history of lymphoma and an upper abdominal aneurysm appears to originate from the left hepatic artery.  Dr. Geroge Baseman of interventional radiology was alerted of these findings and he is scheduled to have consultation on 02/02/2013. He underwent successful complex coil, plug and liquid embolic embolization on 62/26/3335.   His bone marrow biopsy was done on 01/25/2013.   It revealed a normocellular bone marrow for age with trileage hematopoiesis without tumor identified. His peripheral blood revealed normocytic-normochromic anemia with normal cytogenetics.   His Echocardiogram revealed diastolic dysfunction and normal systolic function with a normal EF.  He was seen by cardiology Rowland Lathe) on 02/01/2013.  He was advised to have a repeat echocardiogram after the 4th cycle of ABVD then an echo after treatment was completed.  Additional history demonstrates that his father had Hodgkin's disease and his mother died of an abdominal aneurysm.   He was also referred to genetics counselor.   Today, he reports feeling well.  He denies abdominal pain or nausea or vomiting.  He reports the area were they did the emobolization in the groin has healed nicely.    MEDICAL HISTORY: Past Medical History  Diagnosis Date  . Hypertension   . Diabetes mellitus without complication     Pre-diabetes  . Depression   . Headache(784.0)   . Thyroid disease     Hx: of  . Cancer 2015    cervical lymphadenopathy  . Hodgkin lymphoma 2015  . Hepatic artery aneurysm 2015    INTERIM HISTORY: has Cervical lymphadenopathy; Hodgkin's disease with mixed cellularity; DM (diabetes  mellitus); Hyperlipemia; Obesity; Hypothyroidism; Aneurysm artery, hepatic; Family history of cancer; Diastolic dysfunction;  Thyroid lesion; Essential hypertension; At risk for coronary artery disease; and Chemotherapy-induced cardiomyopathy on his problem list.    ALLERGIES:  has No Known Allergies.  MEDICATIONS: has a current medication list which includes the following prescription(s): azelastine-fluticasone, carvedilol, dexamethasone, fexofenadine, hydrochlorothiazide, levothyroxine, lidocaine-prilocaine, lisinopril, lorazepam, lovastatin, metformin, montelukast, naproxen sodium, ondansetron, potassium chloride, prochlorperazine, sertraline, and oxycodone, and the following Facility-Administered Medications: sodium chloride.  SURGICAL HISTORY:  Past Surgical History  Procedure Laterality Date  . Tonsillectomy    . Mass excision Left 12/31/2012    Procedure: CERVICAL LYMPH NODE BIOPSY;  Surgeon: [name], MD;  Location: [facility];  Service: General;  Laterality: Left;    REVIEW OF SYSTEMS:   Constitutional: Denies fevers, chills or abnormal weight loss Eyes: Denies blurriness of vision Ears, nose, mouth, throat, and face: Denies mucositis or sore throat Respiratory: Denies cough, dyspnea or wheezes Cardiovascular: Denies palpitation, chest discomfort or lower extremity swelling Gastrointestinal:  Denies nausea, heartburn or change in bowel habits Skin: Denies abnormal skin rashes Lymphatics: Denies new lymphadenopathy or easy bruising Neurological:Denies numbness, tingling or new weaknesses Behavioral/Psych: Mood is stable, no new changes  All other systems were reviewed with the patient and are negative.  PHYSICAL EXAMINATION: ECOG PERFORMANCE STATUS: 1 - Symptomatic but completely ambulatory  Blood pressure 111/76, pulse 79, temperature 97.7 [name] (36.5 C), temperature source Oral, resp. rate 20, height 5' 10"  (1.778 m), weight 226 lb 14.4 oz (102.921 kg), SpO2 99.00%.  GENERAL:alert, no distress and comfortable, well developed and well-nourished SKIN: skin color, texture, turgor are normal, no  rashes or significant lesions; groin on the right well-healed. R Port-a-catheter placement  EYES: normal, Conjunctiva are pink and non-injected, sclera clear  OROPHARYNX:no exudate, no erythema and lips, buccal mucosa, and tongue normal ; No signs of thrush.  NECK: supple, thyroid normal size, non-tender, without nodularity  LYMPH: palpable lymphadenopathy in the left cervical and supraclavicular, right axillary (greater than 2 cm, rubbery). No palpable lymphadenopathy in inguinal area  LUNGS: clear to auscultation and percussion with normal breathing effort  HEART: regular rate & rhythm and no murmurs and no lower extremity edema  ABDOMEN:abdomen soft, non-tender and normal bowel sounds  Musculoskeletal:no cyanosis of digits and no clubbing  NEURO: alert & oriented x 3 with fluent speech, no focal motor/sensory deficits  LABORATORY DATA: Results for orders placed in visit on 02/11/13 (from the past 48 hour(s))  CBC WITH DIFFERENTIAL     Status: Abnormal   Collection Time    02/11/13 11:06 AM      Result Value Range   WBC 10.4 (*) 4.0 - 10.3 10e3/uL   NEUT# 7.1 (*) 1.5 - 6.5 10e3/uL   HGB 12.4 (*) 13.0 - 17.1 g/dL   HCT 36.1 (*) 38.4 - 49.9 %   Platelets 474 (*) 140 - 400 10e3/uL   MCV 89.3  79.3 - 98.0 fL   MCH 30.8  27.2 - 33.4 pg   MCHC 34.5  32.0 - 36.0 g/dL   RBC 4.04 (*) 4.20 - 5.82 10e6/uL   RDW 14.7 (*) 11.0 - 14.6 %   lymph# 2.1  0.9 - 3.3 10e3/uL   MONO# 1.1 (*) 0.1 - 0.9 10e3/uL   Eosinophils Absolute 0.1  0.0 - 0.5 10e3/uL   Basophils Absolute 0.1  0.0 - 0.1 10e3/uL   NEUT% 67.6  39.0 - 75.0 %   LYMPH% 19.8  14.0 - 49.0 %   MONO%  11.0  0.0 - 14.0 %   EOS% 1.0  0.0 - 7.0 %   BASO% 0.6  0.0 - 2.0 %  TSH [facility]     Status: Abnormal   Collection Time    02/11/13 11:06 AM      Result Value Range   TSH 9.659 (*) 0.320 - 4.118 m(IU)/L  COMPREHENSIVE METABOLIC PANEL (RA30)     Status: Abnormal   Collection Time    02/11/13 11:06 AM      Result Value Range   Sodium 136   136 - 145 mEq/L   Potassium 4.4  3.5 - 5.1 mEq/L   Chloride 104  98 - 109 mEq/L   CO2 22  22 - 29 mEq/L   Glucose 114  70 - 140 mg/dl   BUN 15.2  7.0 - 26.0 mg/dL   Creatinine 0.8  0.7 - 1.3 mg/dL   Total Bilirubin 0.29  0.20 - 1.20 mg/dL   Alkaline Phosphatase 99  40 - 150 U/L   AST 22  5 - 34 U/L   ALT 15  0 - 55 U/L   Total Protein 8.4 (*) 6.4 - 8.3 g/dL   Albumin 3.0 (*) 3.5 - 5.0 g/dL   Calcium 9.8  8.4 - 10.4 mg/dL   Anion Gap 10  3 - 11 mEq/L    Labs:  Lab Results  Component Value Date   WBC 10.4* 02/11/2013   HGB 12.4* 02/11/2013   HCT 36.1* 02/11/2013   MCV 89.3 02/11/2013   PLT 474* 02/11/2013   NEUTROABS 7.1* 02/11/2013      Chemistry      Component Value Date/Time   NA 136 02/11/2013 1106   NA 137 02/05/2013 0528   K 4.4 02/11/2013 1106   K 4.1 02/05/2013 0528   CL 98 02/05/2013 0528   CO2 22 02/11/2013 1106   CO2 28 02/05/2013 0528   BUN 15.2 02/11/2013 1106   BUN 14 02/05/2013 0528   CREATININE 0.8 02/11/2013 1106   CREATININE 0.99 02/05/2013 0528      Component Value Date/Time   CALCIUM 9.8 02/11/2013 1106   CALCIUM 9.0 02/05/2013 0528   ALKPHOS 99 02/11/2013 1106   ALKPHOS 97 02/05/2013 0528   AST 22 02/11/2013 1106   AST 24 02/05/2013 0528   ALT 15 02/11/2013 1106   ALT 12 02/05/2013 0528   BILITOT 0.29 02/11/2013 1106   BILITOT 0.3 02/05/2013 0528      Basic Metabolic Panel:  Recent Labs Lab 02/05/13 0528 02/11/13 1106  NA 137 136  K 4.1 4.4  CL 98  --   CO2 28 22  GLUCOSE 107* 114  BUN 14 15.2  CREATININE 0.99 0.8  CALCIUM 9.0 9.8   GFR Estimated Creatinine Clearance: 138.7 ml/min (by C-G formula based on Cr of 0.8).  Coagulation profile No results found for this basename: INR, PROTIME,  in the last 168 hours  CBC:  Recent Labs Lab 02/11/13 1106  WBC 10.4*  NEUTROABS 7.1*  HGB 12.4*  HCT 36.1*  MCV 89.3  PLT 474*   CBG:  Recent Labs Lab 02/04/13 1707 02/04/13 2159 02/05/13 0742  GLUCAP 119* 136* 120*    Studies:  No results  found.  RADIOGRAPHIC STUDIES: CT NECK WITH CONTRAST   Dec 07, 2012  TECHNIQUE: Multidetector CT imaging of the neck was performed using the standard protocol following the bolus administration of intravenous contrast. CONTRAST: 53m OMNIPAQUE IOHEXOL 300 MG/ML SOLN COMPARISON: None. FINDINGS: Limited imaging of the brain  is unremarkable. No focal mucosal or submucosal lesions are present. The vocal cords are midline and symmetric. A calcified nodule within the isthmus of the thyroid measures 9 mm. The thyroid is mildly heterogeneous without other focal lesions. Multiple enlarged left-sided cervical lymph nodes are present. The largest left level 2 lymph node measures 3.3 by 1.3 cm on the sagittal images. More significantly enlarged left level 3 and level 4 lymph nodes measure up to 3.3 x 2.4 cm. Axillary lymph nodes are more prominent right than left. The largest right axillary lymph node measures 2.4 x 4.6 cm on the axial images. A 2.1 x 1.8 cm right paratracheal lymph node is present. Other smaller superior mediastinal lymph nodes are present. There smaller nodes scattered throughout the neck bilaterally. The left axilla is less completely imaged than on the right. The lung apices are clear. The bone windows are unremarkable. IMPRESSION: 1. Multiple enlarged cervical lymph nodes, most prominently on the left. These measure up to 3.3 x 2.4 cm. 2. Enlarged right axillary lymph nodes measuring up to 2.4 x 4.6 cm. 3. Enlarged mediastinal lymph nodes including a right paratracheal node measuring 2.1 x 1.8 cm. 4. No primary mucosal lesion. The findings are most compatible with lymphoma. 5. Heterogeneous thyroid with a in 9 mm isthmus lesion. Consider further evaluation with thyroid ultrasound. If patient is clinically hyperthyroid, consider nuclear medicine thyroid uptake and scan.   CHEST 2 VIEW   12/29/2012  COMPARISON: None. FINDINGS: The heart size and mediastinal contours are within normal limits. Both  lungs are clear. The visualized skeletal structures are unremarkable. IMPRESSION: No active cardiopulmonary disease. Electronically Signed By: Kathreen Devoid  On: 12/29/2012 11:17  2D ECHOCARDIOGRAM  01/18/2013 Transthoracic Echocardiography  Patient: Keino, Placencia MR #: [id number] Study Date: 01/18/2013 Gender: M  Age: 48  Height: 177.8cm   Weight: 101.4kg BSA: 2.6m2 Pt. Status:  Room:    SONOGRAPHER GDiamond Nickel ORDERING Nachelle Negrette  PERFORMING [facility] Outpatient  cc: ------------------------------------------------------------ LV EF: 50% - 55% ------------------------------------------------------------ Indications: V58.11 Chemotherapy Evaluation. ------------------------------------------------------------ History: PMH: Peripheral T-cell lymphoma. Cervical lymphadenopathy. Obesity. Hypothyroidism. Risk factors: Diabetes mellitus. Dyslipidemia. ------------------------------------------------------------ Study Conclusions - Left ventricle: The cavity size was normal. Systolic function was normal. The estimated ejection fraction was in the range of 50% to 55%. Wall motion was normal; there were no regional wall motion abnormalities. Doppler parameters are consistent with abnormal left ventricular relaxation (grade 1 diastolic dysfunction). There was no evidence of elevated ventricular filling pressure by Doppler parameters. - Aortic valve: Trileaflet; normal thickness leaflets. Trivial regurgitation. - Mitral valve: No regurgitation. - Right ventricle: Systolic function was normal. Systolic pressure was within the normal range. - Tricuspid valve: Trivial regurgitation. - Pericardium, extracardiac: A trivial pericardial effusion was identified. - Impressions: There is a prosthetic object in the liver, possibly in the hepatic veins that might be partially obstructing flow in the IVC. Impressions: - There is a prosthetic object in the liver, possibly in the hepatic veins  that might be partially obstructing flow in the IVC. Transthoracic echocardiography. M-mode, limited 2D, spectral Doppler, and color Doppler. Height: Height: 177.8cm. Height: 70in. Weight: Weight: 101.4kg. Weight: 223lb. Body mass index: BMI: 32.1kg/m^2. Body surface area: BSA: 2.264m. Blood pressure: 134/97. Patient status: Outpatient. Location: Echo laboratory.  ------------------------------------------------------------  ------------------------------------------------------------ Left ventricle: The cavity size was normal. Systolic function was normal. The estimated ejection fraction was in the range of 50% to 55%. Wall motion was normal; there were no regional wall motion abnormalities. Doppler parameters are consistent  with abnormal left ventricular relaxation (grade 1 diastolic dysfunction). There was no evidence of elevated ventricular filling pressure by Doppler parameters.  ------------------------------------------------------------ Aortic valve: Trileaflet; normal thickness leaflets. Mobility was not restricted. Doppler: Transvalvular velocity was within the normal range. There was no stenosis. Trivial regurgitation.  ------------------------------------------------------------ Aorta: Aortic root: The aortic root was normal in size.  ------------------------------------------------------------ Mitral valve: Structurally normal valve. Mobility was not restricted. Doppler: Transvalvular velocity was within the normal range. There was no evidence for stenosis. No regurgitation.  ------------------------------------------------------------ Left atrium: The atrium was normal in size.  ------------------------------------------------------------ Right ventricle: The cavity size was normal. Wall thickness was normal. Systolic function was normal. Systolic pressure was within the normal range.  ------------------------------------------------------------ Pulmonic  valve: Structurally normal valve. Cusp separation was normal. Doppler: Transvalvular velocity was within the normal range. There was no evidence for stenosis. No regurgitation.  ------------------------------------------------------------ Tricuspid valve: Structurally normal valve. Doppler: Transvalvular velocity was within the normal range. Trivial regurgitation.  ------------------------------------------------------------ Pulmonary artery: The main pulmonary artery was normal-sized. Systolic pressure was within the normal range.  ------------------------------------------------------------ Right atrium: The atrium was normal in size.  ------------------------------------------------------------ Pericardium: A trivial pericardial effusion was identified.  ------------------------------------------------------------ Systemic veins: Inferior vena cava: The vessel was normal in size.  ------------------------------------------------------------  2D measurements Normal Doppler measurements Normal Left ventricle Left ventricle LVID ED, 33.8 mm 43-52 Ea, lat ann, 8.9 cm/s ------ chord, tiss DP 2 PLAX E/Ea, lat 6.8 ------ LVID ES, 28.3 mm 23-38 ann, tiss DP 5 chord, Ea, med ann, 6.8 cm/s ------ PLAX tiss DP 5 FS, chord, 16 % >29 E/Ea, med 8.9 ------ PLAX ann, tiss DP 2 LVPW, ED 10.6 mm ------ Mitral valve IVS/LVPW 0.87 <1.3 Peak E vel 61. cm/s ------ ratio, ED 1 Ventricular septum Peak A vel 51. cm/s ------ IVS, ED 9.19 mm ------ 9 Aorta Deceleration 236 ms 150-23 Root diam, 31 mm ------ time 0 ED Peak E/A 1.2 ------ Left atrium ratio AP dim 34 mm ------ Right ventricle AP dim 1.5 cm/m^2 <2.2 Sa vel, lat 12. cm/s ------ index ann, tiss DP 5  ------------------------------------------------------------ Prepared and Electronically Authenticated by  Ct Chest W Contrast  01/24/2013   CLINICAL DATA:  Lymphoma.  EXAM: CT CHEST, ABDOMEN, AND PELVIS WITH CONTRAST  TECHNIQUE:  Multidetector CT imaging of the chest, abdomen and pelvis was performed following the standard protocol during bolus administration of intravenous contrast.  CONTRAST:  141m OMNIPAQUE IOHEXOL 300 MG/ML  SOLN  COMPARISON:  PET-CT from today  FINDINGS: CT CHEST FINDINGS  No pleural effusion identified. No airspace consolidation noted. There are 2 nonspecific pulmonary nodules identified in the left lower lobe which measure up to 3 mm, image 36/series 4. 4 mm right lower lobe nodule is identified, image 27/series 4.  The trachea appears patent and is midline. The heart size appears normal. Calcified atherosclerotic change involves the LAD coronary artery. Enlarged mediastinal and hilar lymph nodes are identified. Index right hilar lymph node measures 1.6 cm, image 32/series 2. Index right paratracheal lymph node measures 2.1 cm, image 21/series 2. The sub- carinal lymph node measures 1.4 cm. Enlarged right internal mammary node is identified measuring 1.2 cm, image 20/series 2. Enlarged left supraclavicular and bilateral axillary lymph nodes are noted. Index left supraclavicular lymph node measures 1.7 cm, image 1/series 2. Right axillary lymph node measures 1.9 cm, image 15/series 2.  Review of the visualized bony structures is significant for mild spondylosis. No aggressive lytic or sclerotic bone lesions.  CT ABDOMEN AND PELVIS FINDINGS  No focal liver abnormality. The gallbladder appears normal. No biliary dilatation. Normal appearance of the pancreas. Multiple, faint low attenuation nodules within the spleen are noted. There is no splenomegaly.  The adrenal glands both appear normal. Normal appearance of both kidneys. The urinary bladder is unremarkable. Prostate gland and seminal vesicles are negative.  Normal caliber of the abdominal aorta. The right hepatic artery appears replaced an arises from the superior mesenteric artery. There is then aneurysm which involves stress set there is a fusiform aneurysm  involving the left hepatic artery which arises from the celiac trunk. The aneurysm sac measures 8.5 cm in length and has a diameter of 5.8 cm.  Extensive upper abdominal and pelvic adenopathy is identified. Index portacaval lymph node measures 2.3 cm, image 58/series 2. Index right periaortic lymph node measures 2.8 cm, image 68/series 2. Several lymph nodes are noted within the splenic hilum. The largest measure 1.4 cm, image 52/series 2. Just below the bifurcation there is a left common iliac artery lymph node which measures 3.4 cm, image 86/series 2. Right external iliac node measures 2 cm, image 113/series 2. Bilateral inguinal lymph nodes appear borderline enlarged. Left inguinal node measures 1.1 cm, image 129/series 2.  Review of the visualized osseous structures is negative for aggressive lytic or sclerotic bone lesion.  IMPRESSION: CT chest:  1. Enlarged mediastinal, right hilar, internal mammary, left supraclavicular and of bilateral axillary lymph nodes compatible with lymphoma. 2. Small nonspecific pulmonary nodules are identified which measure up to 4 mm. Attention on followup imaging is advised. 3. Coronary artery calcifications CT abdomen and pelvis:  1. Extensive upper abdominal and pelvic adenopathy compatible with lymphoma. 2. Aneurysm involves the left hepatic artery.   Electronically Signed   By: Kerby Moors M.D.   On: 01/24/2013 10:54   Ct Abdomen Pelvis W Contrast  01/24/2013   CLINICAL DATA:  Lymphoma.  EXAM: CT CHEST, ABDOMEN, AND PELVIS WITH CONTRAST  TECHNIQUE: Multidetector CT imaging of the chest, abdomen and pelvis was performed following the standard protocol during bolus administration of intravenous contrast.  CONTRAST:  122m OMNIPAQUE IOHEXOL 300 MG/ML  SOLN  COMPARISON:  PET-CT from today  FINDINGS: CT CHEST FINDINGS  No pleural effusion identified. No airspace consolidation noted. There are 2 nonspecific pulmonary nodules identified in the left lower lobe which measure up to  3 mm, image 36/series 4. 4 mm right lower lobe nodule is identified, image 27/series 4.  The trachea appears patent and is midline. The heart size appears normal. Calcified atherosclerotic change involves the LAD coronary artery. Enlarged mediastinal and hilar lymph nodes are identified. Index right hilar lymph node measures 1.6 cm, image 32/series 2. Index right paratracheal lymph node measures 2.1 cm, image 21/series 2. The sub- carinal lymph node measures 1.4 cm. Enlarged right internal mammary node is identified measuring 1.2 cm, image 20/series 2. Enlarged left supraclavicular and bilateral axillary lymph nodes are noted. Index left supraclavicular lymph node measures 1.7 cm, image 1/series 2. Right axillary lymph node measures 1.9 cm, image 15/series 2.  Review of the visualized bony structures is significant for mild spondylosis. No aggressive lytic or sclerotic bone lesions.  CT ABDOMEN AND PELVIS FINDINGS  No focal liver abnormality. The gallbladder appears normal. No biliary dilatation. Normal appearance of the pancreas. Multiple, faint low attenuation nodules within the spleen are noted. There is no splenomegaly.  The adrenal glands both appear normal. Normal appearance of both kidneys. The urinary bladder is unremarkable. Prostate gland and seminal vesicles are negative.  Normal caliber of the abdominal aorta. The right hepatic artery appears replaced an arises from the superior mesenteric artery. 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 The aneurysm sac measures 8.5 cm in length and has a diameter of 5.8 cm.  Extensive upper abdominal and pelvic adenopathy is identified. Index portacaval lymph node measures 2.3 cm, image 58/series 2. Index right periaortic lymph node measures 2.8 cm, image 68/series 2. Several lymph nodes are noted within the splenic hilum. The largest measure 1.4 cm, image  52/series 2. Just below the bifurcation there is a left common iliac artery lymph node which measures 3.4 cm, image 86/series 2. Right external iliac node measures 2 cm, image 113/series 2. Bilateral inguinal lymph nodes appear borderline enlarged. Left inguinal node measures 1.1 cm, image 129/series 2.  Review of the visualized osseous structures is negative for aggressive lytic or sclerotic bone lesion.  IMPRESSION: CT chest:  1. Enlarged mediastinal, right hilar, internal mammary, left supraclavicular and of bilateral axillary lymph nodes compatible with lymphoma. 2. Small nonspecific pulmonary nodules are identified which measure up to 4 mm. Attention on followup imaging is advised. 3. Coronary artery calcifications CT abdomen and pelvis:  1. Extensive upper abdominal and pelvic adenopathy compatible with lymphoma. 2. Aneurysm involves the left hepatic artery.   Electronically Signed   By: Kerby Moors M.D.   On: 01/24/2013 10:54   Nm Pet Image Initial (pi) Skull Base To Thigh  01/24/2013   CLINICAL DATA:  Initial treatment strategy for lymphoma.  EXAM: NUCLEAR MEDICINE PET SKULL BASE TO THIGH  FASTING BLOOD GLUCOSE:  Value: 130m/dl  TECHNIQUE: 18.8 mCi [name]-18 FDG was injected intravenously. CT data was obtained and used for attenuation correction and anatomic localization only. (This was not acquired as a diagnostic CT examination.) Additional exam technical data entered on technologist worksheet.  COMPARISON:  None  FINDINGS: NECK  Bilateral, hypermetabolic cervical adenopathy is noted, left greater than right. Index lymph node within the left supraclavicular region measures 1.7 cm and has an SUV max equal to 9.2, image 48.  CHEST  No pleural effusion identified. There is no airspace consolidation or atelectasis identified. A few scattered sub cm nodules are identified. These are too small to reliably characterize. Index nodule in the right apex measures 4 mm, image 59.  Enlarged and hypermetabolic  mediastinal and right hilar lymph nodes are identified. Index right paratracheal lymph node measures 1.7 cm and has an SUV max equal 8.2, image 75. There is a hypermetabolic right internal mammary lymph node identified. This has an SU the max equal to 4.0, image 81.  Enlarged and hypermetabolic bilateral axillary lymph nodes are identified. Index right axillary node measures 1.7 cm and has an SUV max equal 8.3, image 60.  ABDOMEN/PELVIS  Borderline enlarged right cardiophrenic lymph node measures 1.2 cm and has an SUV max equal to 110. No focal liver abnormality identified. The gallbladder appears normal. Normal appearance of the pancreas. The spleen measures 10 cm in cranial caudal dimension. No focal splenic abnormality identified.  The adrenal glands are normal. Normal appearance of both kidneys. The urinary bladder is unremarkable.  Extensive upper abdominal adenopathy identified. Enlarged portal caval lymph node measures 3 cm and has an SUV max equal to 8.8, image 139. Left periaortic lymph node measures 2.4 cm and has an SUV max equal to 9.2 in, image 163. Extensive, bilateral iliac adenopathy is hypermetabolic. Index  left external iliac node measures 2 cm and has an SUV max equal to 8.7, image 213. Borderline enlarged bilateral inguinal lymph nodes are identified which exhibit mild increased FDG uptake. Index left inguinal node measures 0.9 cm and has an SUV max equal 3.3  Calcified atherosclerotic disease involves the abdominal aorta. There is aneurysmal dilatation of the left hepatic artery. The aneurysm sac measures 7.7 cm in length and has a diameter of 5.3 cm.  SKELETON  No abnormal uptake identified within the axial skeleton or proximal appendicular skeleton.  IMPRESSION: 1. Hypermetabolic adenopathy is identified within the neck, chest, abdomen and pelvis compatible with the history of lymphoma. 2. Upper abdominal aneurysm appears to originate from the left hepatic artery. These results were called by  telephone at the time of interpretation on 01/24/2013 at 10:07 AM to Dr. Concha Norway , who verbally acknowledged these results.   Electronically Signed   By: Kerby Moors M.D.   On: 01/24/2013 10:10   Ct Biopsy  01/25/2013   INDICATION: Evaluate for lymphoma  EXAM: CT BIOPSY  MEDICATIONS: Fentanyl 150 mcg IV; Versed 2 mg IV  ANESTHESIA/SEDATION: Sedation Time  10 minutes  CONTRAST:  None  PROCEDURE: Informed consent was obtained from the patient following an explanation of the procedure, risks, benefits and alternatives. The patient understands, agrees and consents for the procedure. All questions were addressed. A time out was performed prior to the initiation of the procedure. The patient was positioned prone and noncontrast localization CT was performed of the pelvis to demonstrate the iliac marrow spaces. The operative site was prepped and draped in the usual sterile fashion.  Under sterile conditions and local anesthesia, an 11 gauge coaxial bone biopsy needle was advanced into the left iliac marrow space. Needle position was confirmed with CT imaging. Initially, bone marrow aspiration was performed. Next, a bone marrow biopsy was obtained with the 11 gauge outer bone marrow device. Samples were prepared with the cytotechnologist and deemed adequate. The needle was removed intact. Hemostasis was obtained with compression and a dressing was placed. The patient tolerated the procedure well without immediate post procedural complication.  COMPLICATIONS: None immediate.  IMPRESSION: Successful CT guided left iliac bone marrow aspiration and core biopsies.   Electronically Signed   By: Sandi Mariscal M.D.   On: 01/25/2013 09:53   PATHOLOGY: Tissue-Flow Cytometry - PREDOMINATE CD4 T-CELL POPULATION, SEE COMMENT.Diagnosis Comment:Lymphocytes consist predominately of T-cells (90%) with the majority (83%) of those being CD4 positive.There is no aberrant phenotype. No monoclonal B-cell population is seen. See tissue  specimen ([id number]) for further details and correlation.  Lymph node for lymphoma, left supraclavicular - CLASSICAL HODGKIN LYMPHOMA, MIXED CELLULARITY SUBTYPE. - SEE ONCOLOGY TABLE. Microscopic Comment LYMPHOMA Histologic type: Classical Hodgkin lymphoma, mixed cellularity subtype. Grade (if applicable): N/A. Flow cytometry: [id number] reveals a predominate CD4 T-cell population. There is no monoclonal B-cell population.Immunohistochemical stains: CD45, CD20, CD3, CD10, bcl-2, bcl-6, CD21, CD4, CD8, CD5, cyclinD1, Ki-67, CD30, CD15, CD79a Outside immunohistochemistry: EBER, beta F1, CD2, CD7, pax-5 Touch preps/imprints: Small lymphocytes with rare large cells. Comments: Sections of lymph node are effaced by a diffuse lymphohistiocytic infiltrate composed of small lymphocytes with mildly irregular nuclear contours and inconspicuous nucleoli. There are background plasma cells and eosinophils. There is increased vascularity. There are scattered large cells with multinucleation and prominent nucleoli, consistent with Hodgkin Reed-Sternberg (HRS) cells. There is no significant fibrosis.Immunohistochemistry reveals the majority of lymphocytes are CD3, CD4-positive T-cells. The T-cells have normal expression of CD5. Immunohistochemistry performed at  an outside institution reveals the T-cells retain  normal expression of CD2 and CD7. They express betaF1. Only sparse CD8 cells are seen. Bcl-2 is positive in the T-cells. Ki-67 shows scattered large cells and is positive in many of the T-cells. CD20 and CD79a highlight a few residual areas of B-cells. The HRS cells are positive CD30, CD15, and pax-5. HRS cells are negative for CD45. EBV in situ hybridization (EBER) is positive in scattered B-cells and negative in the T-cells and large HRS cells. CD21 highlights areas of residual follicular dendritic cell networks. CD10, bcl-6 and cyclinD1 are negative. Flow cytometry ([id number]) reveals a predominate population  of CD4-positive T-cells  without aberrant phenotype. PCR for T-cell receptor gene rearrangement was performed at an outside insititution and revealed a polyclonal T-cell population (see outside report), excluding a T-cell lymphoma with HRS cells. Overall, these findings are consistent with a classical Hodgkin lymphoma, mixed cellularity subtype. Dr. Juliann Mule was paged on 01/20/2012.  01/26/2012 Diagnosis Bone Marrow, Aspirate,Biopsy, and Clot, left iliac - NORMOCELLULAR BONE MARROW FOR AGE WITH TRILINEAGE HEMATOPOIESIS. - NO TUMOR IDENTIFIED. PERIPHERAL BLOOD: - NORMOCYTIC-NORMOCHROMIC ANEMIA. Susanne Greenhouse MD Pathologist, Electronic Signature (Case signed 01/26/2013) GROSS AND MICROSCOPIC INFORMATION Specimen Clinical Information Hodgkin's lymphoma (je) Source Bone Marrow, Aspirate,Biopsy, and Clot, left iliac Microscopic LAB DATA: CBC performed on 01/25/2013 shows: WBC 9.8 K/ul Neutrophils 72% HB 12.5 g/dl Lymphocytes 21% HCT 36.1 % Monocytes 7% MCV 87.8 fL Eosinophils 0% RDW 14.4 % Basophils 0% PLT 323 K/ul PERIPHERAL BLOOD SMEAR: The red blood cells display mild anisopoikilocytosis with mild polychromasia. The white blood cells are normal in number, mostly with neutrophils. Occasional myelocytes are seen on scan. The platelets are normal in number. BONE MARROW ASPIRATE: Erythroid precursors: Orderly and progressive maturation. 1 of 2 FINAL for [name] [name], [name] [name] ([id number]) Microscopic(continued) Granulocytic precursors: Orderly and progressive maturation. Megakaryocytes: Abundant with predominately normal morphology. Lymphocytes/plasma cells: Large aggregates not present. TOUCH PREPARATIONS: A mixture of cell type present. CLOT and BIOPSY: The sections show 40 to 60% cellularity with a mixture of myeloid cell type. Significantlymphoid aggregates are not present.IRON STAIN: Iron stains are performed on a bone marrow aspirate smear and section of clot. The controls stained  appropriately. Storage Iron: Present.Ringed Sideroblasts: Absent.ADDITIONAL DATA / TESTING: Specimen was sent for cytogenetic analysis and a separate report willfollow. (BNS:gt, 01/26/13) Specimen Table Bone Marrow count performed on 500 cells shows: Blasts: 1% Myeloid 70% Promyelocyts: 3% Myelocytes: 12% Erythroid 19% Metamyelocyts: 3% Bands: 25% Lymphocytes: 6% Neutrophils: 20% Eosinophils: 6% Plasma Cells: 4% Basophils: 0% Monocytes: 1% M:E ratio: 3.68 Gross Received in [employer] is a 1.3 x 1 x 0.1 cm aggregate of dark red soft tissue/material. Submitted in block A. Also received in [employer] in a separate container are two cores of tan-red firm tissue, 0.3 x 0.15 cm and 1.5 x   ASSESSMENT: [name] [name] 48 y.o. male with a history of Hodgkin's disease with mixed cellularity - Plan: oxyCODONE (OXY IR/ROXICODONE) 5 MG immediate release tablet, CBC with Differential, Comprehensive metabolic panel (Cmet) - [facility], Lactate dehydrogenase (LDH) - [facility], CBC with Differential, Lactate dehydrogenase (LDH) - [facility], Comprehensive metabolic panel (Cmet) - [facility], CBC with Differential, CBC with Differential  PLAN:  1. Classical Hodgkin's Disease with mixed cellularity, c/w Stage III (if pulmonary nodules which are less than 1cm are negative).  Bone marrow is negative for lymphoma.  IPS for Hodgkin's disease (3 points for male, age greater than 59, albumin less than 4; 60% 5 year FFP, 78% 5-year overall survival based  on [employer], NEJM, 1998).  --We reviewed extensively his imaging of CT of neck, chest and abdomen and pelvis and PET and his bone marrow and left supraclavicular pathology and final report conclusive for classical hodgkin's disease with mixed cellularity c/w with stage III.  Pulmonary nodules measuring 4 mm are not conclusive for involvement.  He reports night sweats which would be considered a  constitutional  B symptoms. His ESR is more than 50. LDH of 297 is elevated above normal range. CXR  does not demonstrate bulky mediastinal disease greater than 10 cm.    --We provided [name] [name] a detailed handout and a detailed discussion about his new diagnosis of Hodgkin's lymphoma. We addressed several of his questions. The handout covers basics on lymphoma and its staging and possible treatment options. We completed and reviewed the PET CT, a chest x-ray to evaluate for bulky disease greater than 10 cm, and a diagnostic CT of the abdomen and pelvis and chest to better characterize the extent of her disease and anatomy. They revealed  hypermetabolic adenopathy within the neck, chest, abdomen and pelvis compatible with the history of lymphoma.  Based on advanced lymphoma, he received a bone marrow biopsy and it was negative for involvement of lymphoma.  Cytogenetic report is pending. We evaluated his ejection fraction prior to doxorubicin-containing regimens with a 2D-echo as noted above.   --Patient had a R Port-a-catheter placement and pulmonary function tests.   He elected against referral for fertility or cryopreservation.   --We will perform cheomtherapy with ABVD x 6 cycles (category 2A) q28 days (doxorubicin, bleomycin, vinblastine, and dacarbazine) (Duggan DB, JCO, 2003). We had a detailed discussion regarding the benefits of including approximately 80 percent of patients with advanced stage HL will attain a complete response after treatment with ABVD. Up to one-quarter of patients will have disease progression requiring further therapy; half of those will have long term survival with autologous hematopoietic cell transplantation. Overall survival rates at 4, 7, and 10 years are approximately 90, 75, and 55 percent, respectively.  --The risks of ABVD was provided as a handout and the patient attened chemotherapy teaching on 01/26. These risks include but are not limited to bone marrow suppression which can result in life-threatening infections, hair loss, GI disturbances, fatigue,  nausea/vomiting. He understood the benefits and risk and chose to proceed. We will give Day #1  chemotherapy as follows (contingent on work-up of his left hepatic artery aneursym):          Day 1, Cycle 1A 02/11/2013         Doxorubicin (Adriamycin) 25 mg/m2         Vinblastine 6 mg/m2         Bleomycin 10 units/m2         Dacarbazine 375 mg/m2   -He will have Day1, Cycle 1B on 02/13.  We will check weekly cbc to determine count recovery. We will obtain an restaging PET-CT following Cycle #2 (NCCN Guidelines, Version 2.2014, HODG-9) to assess response and further treatment based on [place] criteria for response.   --He has an excellent performance status and night sweats are concerning for a B-symptom. His CBC demonstrates mild anemia with normal plts. Both  LDH and uric acid level is elevated. His creatinine is normal.  His calcium and serum potassium is normal.  He has small to medium tumor burden but low risk for tumor lysis syndrome Nadara Mustard [place], et al, the Tumor Lysis Syndrome. NEJM, 2011; 595:6387) and will therefore be  hydrated aggressively with close monitoring.    We tested hepatitis and HIV with were negative.    2. Left Hepatic artery aneurysm S/p embolization by IR on 02-04-2013.  --He had follow-up Dr. Geroge Baseman for consideration of coil embolization on 02/04/2013.   His liver enzymes remain normal limits. He was counseled to report any symptoms of abdominal pain and avoid any sports or high energy exercises. He voiced understanding and agreement with this plan.   3. Heterogeneous thyroid with a in 9 mm isthmus lesion.  --Consider further evaluation with thyroid ultrasound. If patient is clinically hyperthyroid, consider nuclear medicine thyroid uptake and scan.  --TSH, Free T4 pending  4. Diastolic Dysfunction, Grade I. --Echocardiogram as noted above.  Patient has a history of DM2, Hypertension.  Repeat Echocardiogram following cycle #4.    4. Family History of cancer, cancer  prior to age of 36. --Patient has Jewish ancestry and reports father was also diagnosed with hodgkin's disease.  He has an appointment with Genetics for consultation.   5. DM2.  --Continue Metformin 500 mg daily per PCP/endo.   6. Normocytic Normochromic anemia. --Likely anemia of chronic disease.     7. Hypertension/HLD/CAD.  --Continue lisinopril 10 mg daily.  --Continue hydrochlorothiazide 50 mg daily with KCL 10 mEQ daily.  --Continue Lovastatin 500 mg daily.   8. Hypothyroidism  --Continue levothyroxine 200 mg daily per endo. Checked TSH, T4.  TSH slightly high. Thyroid ultrasound as needed. (see #4)  9. Obesity  --Continue dieting and weight lost.   10. Follow-up.  --Patient will require follow-up in 4 weeks for consideration of cycle #2 of ABVD.  He will check labs weekly.     All questions were answered. The patient knows to call the clinic with any problems, questions or concerns. We can certainly see the patient much sooner if necessary.  I spent 15 minutes counseling the patient face to face. The total time spent in the appointment was 25 minutes.    Niki Payment, MD 02/11/2013 3:28 PM

## 2013-02-11 NOTE — Patient Instructions (Signed)
Neutropenia Neutropenia is a condition that occurs when the level of a certain type of white blood cell (neutrophil) in your body becomes lower than normal. Neutrophils are made in the bone marrow and fight infections. These cells protect against bacteria and viruses. The fewer neutrophils you have, and the longer your body remains without them, the greater your risk of getting a severe infection becomes. CAUSES  The cause of neutropenia may be hard to determine. However, it is usually due to 3 main problems:   Decreased production of neutrophils. This may be due to:  Certain medicines such as chemotherapy.  Genetic problems.  Cancer.  Radiation treatments.  Vitamin deficiency.  Some pesticides.  Increased destruction of neutrophils. This may be due to:  Overwhelming infections.  Hemolytic anemia. This is when the body destroys its own blood cells.  Chemotherapy.  Neutrophils moving to areas of the body where they cannot fight infections. This may be due to:  Dialysis procedures.  Conditions where the spleen becomes enlarged. Neutrophils are held in the spleen and are not available to the rest of the body.  Overwhelming infections. The neutrophils are held in the area of the infection and are not available to the rest of the body. SYMPTOMS  There are no specific symptoms of neutropenia. The lack of neutrophils can result in an infection, and an infection can cause various problems. DIAGNOSIS  Diagnosis is made by a blood test. A complete blood count is performed. The normal level of neutrophils in human blood differs with age and race. Infants have lower counts than older children and adults. African Americans have lower counts than Caucasians or Asians. The average adult level is 1500 cells/mm3 of blood. Neutrophil counts are interpreted as follows:  Greater than 1000 cells/mm3 gives normal protection against infection.  500 to 1000 cells/mm3 gives an increased risk for  infection.  200 to 500 cells/mm3 is a greater risk for severe infection.  Lower than 200 cells/mm3 is a marked risk of infection. This may require hospitalization and treatment with antibiotic medicines. TREATMENT  Treatment depends on the underlying cause, severity, and presence of infections or symptoms. It also depends on your health. Your caregiver will discuss the treatment plan with you. Mild cases are often easily treated and have a good outcome. Preventative measures may also be started to limit your risk of infections. Treatment can include:  Taking antibiotics.  Stopping medicines that are known to cause neutropenia.  Correcting nutritional deficiencies by eating green vegetables to supply folic acid and taking vitamin B supplements.  Stopping exposure to pesticides if your neutropenia is related to pesticide exposure.  Taking a blood growth factor called sargramostim, pegfilgrastim, or filgrastim if you are undergoing chemotherapy for cancer. This stimulates white blood cell production.  Removal of the spleen if you have Felty's syndrome and have repeated infections. HOME CARE INSTRUCTIONS   Follow your caregiver's instructions about when you need to have blood work done.  Wash your hands often. Make sure others who come in contact with you also wash their hands.  Wash raw fruits and vegetables before eating them. They can carry bacteria and fungi.  Avoid people with colds or spreadable (contagious) diseases (chickenpox, herpes zoster, influenza).  Avoid large crowds.  Avoid construction areas. The dust can release fungus into the air.  Be cautious around children in daycare or school environments.  Take care of your respiratory system by coughing and deep breathing.  Bathe daily.  Protect your skin from cuts and   burns.  Do not work in the garden or with flowers and plants.  Care for the mouth before and after meals by brushing with a soft toothbrush. If you have  mucositis, do not use mouthwash. Mouthwash contains alcohol and can dry out the mouth even more.  Clean the area between the genitals and the anus (perineal area) after urination and bowel movements. Women need to wipe from front to back.  Use a water soluble lubricant during sexual intercourse and practice good hygiene after. Do not have intercourse if you are severely neutropenic. Check with your caregiver for guidelines.  Exercise daily as tolerated.  Avoid people who were vaccinated with a live vaccine in the past 30 days. You should not receive live vaccines (polio, typhoid).  Do not provide direct care for pets. Avoid animal droppings. Do not clean litter boxes and bird cages.  Do not share food utensils.  Do not use tampons, enemas, or rectal suppositories unless directed by your caregiver.  Use an electric razor to remove hair.  Wash your hands after handling magazines, letters, and newspapers. SEEK IMMEDIATE MEDICAL CARE IF:   You have a fever.  You have chills or start to shake.  You feel nauseous or vomit.  You develop mouth sores.  You develop aches and pains.  You have redness and swelling around open wounds.  Your skin is warm to the touch.  You have pus coming from your wounds.  You develop swollen lymph nodes.  You feel weak or fatigued.  You develop red streaks on the skin. MAKE SURE YOU:  Understand these instructions.  Will watch your condition.  Will get help right away if you are not doing well or get worse. Document Released: 06/21/2001 Document Revised: 03/24/2011 Document Reviewed: 07/19/2010 [employer] Patient Information 2014 [employer].  

## 2013-02-11 NOTE — Telephone Encounter (Signed)
gv adn printed appt sched adn avs for pt for Feb...sed added tx. °

## 2013-02-11 NOTE — Progress Notes (Signed)
[facility] blood return before, during and after Adriamycin.

## 2013-02-11 NOTE — Telephone Encounter (Signed)
gv adn printed appt scehd and avs for pt for Feb....sed added tx.

## 2013-02-14 ENCOUNTER — Telehealth: Payer: Self-pay | Admitting: *Deleted

## 2013-02-14 NOTE — Telephone Encounter (Signed)
Message copied by Cherylynn Ridges on Mon Feb 14, 2013  5:47 PM ------      Message from: Amelia Jo I      Created: Fri Feb 11, 2013  3:13 PM      Regarding: follow up call       First time Adriamycin, Velban, Bleocin and Dacarbazine. No reaction. Dr. Juliann Mule. Patient prefers to be called in the afternoon. Call cell phone...number is [phone]. ------

## 2013-02-14 NOTE — Telephone Encounter (Signed)
Called [name] [name] at mobile number(s).  Messge left requesting a return call for chemotherapy follow up.  Awaiting return call from patient.

## 2013-02-18 ENCOUNTER — Other Ambulatory Visit (HOSPITAL_BASED_OUTPATIENT_CLINIC_OR_DEPARTMENT_OTHER): Payer: PRIVATE HEALTH INSURANCE

## 2013-02-18 DIAGNOSIS — C8120 Mixed cellularity classical Hodgkin lymphoma, unspecified site: Secondary | ICD-10-CM

## 2013-02-18 DIAGNOSIS — C8128 Mixed cellularity classical Hodgkin lymphoma, lymph nodes of multiple sites: Secondary | ICD-10-CM

## 2013-02-18 LAB — CBC WITH DIFFERENTIAL/PLATELET
BASO%: 0.7 % (ref 0.0–2.0)
Basophils Absolute: 0.1 10*3/uL (ref 0.0–0.1)
EOS%: 1.5 % (ref 0.0–7.0)
Eosinophils Absolute: 0.2 10*3/uL (ref 0.0–0.5)
HCT: 38.3 % — ABNORMAL LOW (ref 38.4–49.9)
HGB: 13.1 g/dL (ref 13.0–17.1)
LYMPH%: 20.9 % (ref 14.0–49.0)
MCH: 30.2 pg (ref 27.2–33.4)
MCHC: 34.2 g/dL (ref 32.0–36.0)
MCV: 88.4 fL (ref 79.3–98.0)
MONO#: 0.3 10*3/uL (ref 0.1–0.9)
MONO%: 2.4 % (ref 0.0–14.0)
NEUT%: 74.5 % (ref 39.0–75.0)
NEUTROS ABS: 8.2 10*3/uL — AB (ref 1.5–6.5)
Platelets: 503 10*3/uL — ABNORMAL HIGH (ref 140–400)
RBC: 4.33 10*6/uL (ref 4.20–5.82)
RDW: 14.6 % (ref 11.0–14.6)
WBC: 11 10*3/uL — ABNORMAL HIGH (ref 4.0–10.3)
lymph#: 2.3 10*3/uL (ref 0.9–3.3)

## 2013-02-25 ENCOUNTER — Other Ambulatory Visit (HOSPITAL_BASED_OUTPATIENT_CLINIC_OR_DEPARTMENT_OTHER): Payer: PRIVATE HEALTH INSURANCE

## 2013-02-25 ENCOUNTER — Ambulatory Visit (HOSPITAL_BASED_OUTPATIENT_CLINIC_OR_DEPARTMENT_OTHER): Payer: PRIVATE HEALTH INSURANCE

## 2013-02-25 ENCOUNTER — Other Ambulatory Visit: Payer: Self-pay | Admitting: Internal Medicine

## 2013-02-25 VITALS — BP 112/64 | HR 83 | Temp 98.0°F

## 2013-02-25 DIAGNOSIS — C8128 Mixed cellularity classical Hodgkin lymphoma, lymph nodes of multiple sites: Secondary | ICD-10-CM

## 2013-02-25 DIAGNOSIS — Z5111 Encounter for antineoplastic chemotherapy: Secondary | ICD-10-CM

## 2013-02-25 DIAGNOSIS — C8120 Mixed cellularity classical Hodgkin lymphoma, unspecified site: Secondary | ICD-10-CM

## 2013-02-25 LAB — CBC WITH DIFFERENTIAL/PLATELET
BASO%: 0.6 % (ref 0.0–2.0)
BASOS ABS: 0.1 10*3/uL (ref 0.0–0.1)
EOS%: 0.2 % (ref 0.0–7.0)
Eosinophils Absolute: 0 10*3/uL (ref 0.0–0.5)
HEMATOCRIT: 40.7 % (ref 38.4–49.9)
HGB: 13.8 g/dL (ref 13.0–17.1)
LYMPH%: 23.4 % (ref 14.0–49.0)
MCH: 30.2 pg (ref 27.2–33.4)
MCHC: 33.9 g/dL (ref 32.0–36.0)
MCV: 89.2 fL (ref 79.3–98.0)
MONO#: 0.3 10*3/uL (ref 0.1–0.9)
MONO%: 3.5 % (ref 0.0–14.0)
NEUT#: 5.9 10*3/uL (ref 1.5–6.5)
NEUT%: 72.3 % (ref 39.0–75.0)
Platelets: 399 10*3/uL (ref 140–400)
RBC: 4.56 10*6/uL (ref 4.20–5.82)
RDW: 15.1 % — AB (ref 11.0–14.6)
WBC: 8.2 10*3/uL (ref 4.0–10.3)
lymph#: 1.9 10*3/uL (ref 0.9–3.3)

## 2013-02-25 LAB — COMPREHENSIVE METABOLIC PANEL (CC13)
ALK PHOS: 85 U/L (ref 40–150)
ALT: 47 U/L (ref 0–55)
AST: 37 U/L — AB (ref 5–34)
Albumin: 3.4 g/dL — ABNORMAL LOW (ref 3.5–5.0)
Anion Gap: 10 mEq/L (ref 3–11)
BUN: 16.5 mg/dL (ref 7.0–26.0)
CO2: 23 mEq/L (ref 22–29)
Calcium: 10.1 mg/dL (ref 8.4–10.4)
Chloride: 101 mEq/L (ref 98–109)
Creatinine: 0.9 mg/dL (ref 0.7–1.3)
Glucose: 173 mg/dl — ABNORMAL HIGH (ref 70–140)
Potassium: 4.5 mEq/L (ref 3.5–5.1)
SODIUM: 134 meq/L — AB (ref 136–145)
Total Bilirubin: 0.32 mg/dL (ref 0.20–1.20)
Total Protein: 8.2 g/dL (ref 6.4–8.3)

## 2013-02-25 LAB — LACTATE DEHYDROGENASE (CC13): LDH: 197 U/L (ref 125–245)

## 2013-02-25 MED ORDER — SODIUM CHLORIDE 0.9 % IV SOLN
Freq: Once | INTRAVENOUS | Status: AC
  Administered 2013-02-25: 13:00:00 via INTRAVENOUS

## 2013-02-25 MED ORDER — HEPARIN SOD (PORK) LOCK FLUSH 100 UNIT/ML IV SOLN
500.0000 [IU] | Freq: Once | INTRAVENOUS | Status: AC | PRN
  Administered 2013-02-25: 500 [IU]
  Filled 2013-02-25: qty 5

## 2013-02-25 MED ORDER — SODIUM CHLORIDE 0.9 % IJ SOLN
10.0000 mL | INTRAMUSCULAR | Status: DC | PRN
  Administered 2013-02-25: 10 mL
  Filled 2013-02-25: qty 10

## 2013-02-25 MED ORDER — DACARBAZINE 200 MG IV SOLR
375.0000 mg/m2 | Freq: Once | INTRAVENOUS | Status: AC
  Administered 2013-02-25: 820 mg via INTRAVENOUS
  Filled 2013-02-25: qty 41

## 2013-02-25 MED ORDER — ONDANSETRON 16 MG/50ML IVPB (CHCC)
16.0000 mg | Freq: Once | INTRAVENOUS | Status: AC
  Administered 2013-02-25: 16 mg via INTRAVENOUS

## 2013-02-25 MED ORDER — DEXAMETHASONE SODIUM PHOSPHATE 20 MG/5ML IJ SOLN
20.0000 mg | Freq: Once | INTRAMUSCULAR | Status: AC
  Administered 2013-02-25: 20 mg via INTRAVENOUS

## 2013-02-25 MED ORDER — VINBLASTINE SULFATE CHEMO INJECTION 1 MG/ML
13.0000 mg | Freq: Once | INTRAVENOUS | Status: AC
  Administered 2013-02-25: 13 mg via INTRAVENOUS
  Filled 2013-02-25: qty 13

## 2013-02-25 MED ORDER — DOXORUBICIN HCL CHEMO IV INJECTION 2 MG/ML
25.0000 mg/m2 | Freq: Once | INTRAVENOUS | Status: AC
  Administered 2013-02-25: 54 mg via INTRAVENOUS
  Filled 2013-02-25: qty 27

## 2013-02-25 MED ORDER — SODIUM CHLORIDE 0.9 % IV SOLN
10.0000 [IU]/m2 | Freq: Once | INTRAVENOUS | Status: AC
  Administered 2013-02-25: 22 [IU] via INTRAVENOUS
  Filled 2013-02-25: qty 7.33

## 2013-02-25 MED ORDER — DEXAMETHASONE SODIUM PHOSPHATE 20 MG/5ML IJ SOLN
INTRAMUSCULAR | Status: AC
  Filled 2013-02-25: qty 5

## 2013-02-25 MED ORDER — ONDANSETRON 16 MG/50ML IVPB (CHCC)
INTRAVENOUS | Status: AC
  Filled 2013-02-25: qty 16

## 2013-02-25 NOTE — Patient Instructions (Signed)
[facility] Discharge Instructions for Patients Receiving Chemotherapy  Today you received the following chemotherapy agents doxorubicin/vinblastine/bleomycin/dacarbazine  To help prevent nausea and vomiting after your treatment, we encourage you to take your nausea medication as directed.    If you develop nausea and vomiting that is not controlled by your nausea medication, call the clinic.   BELOW ARE SYMPTOMS THAT SHOULD BE REPORTED IMMEDIATELY:  *FEVER GREATER THAN 100.5 F  *CHILLS WITH OR WITHOUT FEVER  NAUSEA AND VOMITING THAT IS NOT CONTROLLED WITH YOUR NAUSEA MEDICATION  *UNUSUAL SHORTNESS OF BREATH  *UNUSUAL BRUISING OR BLEEDING  TENDERNESS IN MOUTH AND THROAT WITH OR WITHOUT PRESENCE OF ULCERS  *URINARY PROBLEMS  *BOWEL PROBLEMS  UNUSUAL RASH Items with * indicate a potential emergency and should be followed up as soon as possible.  Feel free to call the clinic you have any questions or concerns. The clinic phone number is [phone].  

## 2013-03-04 ENCOUNTER — Other Ambulatory Visit (HOSPITAL_BASED_OUTPATIENT_CLINIC_OR_DEPARTMENT_OTHER): Payer: PRIVATE HEALTH INSURANCE

## 2013-03-04 DIAGNOSIS — C8120 Mixed cellularity classical Hodgkin lymphoma, unspecified site: Secondary | ICD-10-CM

## 2013-03-04 DIAGNOSIS — D649 Anemia, unspecified: Secondary | ICD-10-CM

## 2013-03-04 DIAGNOSIS — C8128 Mixed cellularity classical Hodgkin lymphoma, lymph nodes of multiple sites: Secondary | ICD-10-CM

## 2013-03-04 LAB — CBC WITH DIFFERENTIAL/PLATELET
BASO%: 0.8 % (ref 0.0–2.0)
Basophils Absolute: 0.1 10*3/uL (ref 0.0–0.1)
EOS%: 2.6 % (ref 0.0–7.0)
Eosinophils Absolute: 0.2 10*3/uL (ref 0.0–0.5)
HCT: 40.5 % (ref 38.4–49.9)
HEMOGLOBIN: 13.7 g/dL (ref 13.0–17.1)
LYMPH%: 34 % (ref 14.0–49.0)
MCH: 30.2 pg (ref 27.2–33.4)
MCHC: 33.8 g/dL (ref 32.0–36.0)
MCV: 89.5 fL (ref 79.3–98.0)
MONO#: 0.2 10*3/uL (ref 0.1–0.9)
MONO%: 2.7 % (ref 0.0–14.0)
NEUT#: 5.3 10*3/uL (ref 1.5–6.5)
NEUT%: 59.9 % (ref 39.0–75.0)
PLATELETS: 314 10*3/uL (ref 140–400)
RBC: 4.52 10*6/uL (ref 4.20–5.82)
RDW: 14.6 % (ref 11.0–14.6)
WBC: 8.9 10*3/uL (ref 4.0–10.3)
lymph#: 3 10*3/uL (ref 0.9–3.3)

## 2013-03-11 ENCOUNTER — Telehealth: Payer: Self-pay

## 2013-03-11 ENCOUNTER — Ambulatory Visit: Payer: PRIVATE HEALTH INSURANCE

## 2013-03-11 ENCOUNTER — Other Ambulatory Visit (HOSPITAL_BASED_OUTPATIENT_CLINIC_OR_DEPARTMENT_OTHER): Payer: PRIVATE HEALTH INSURANCE

## 2013-03-11 ENCOUNTER — Ambulatory Visit (HOSPITAL_BASED_OUTPATIENT_CLINIC_OR_DEPARTMENT_OTHER): Payer: PRIVATE HEALTH INSURANCE | Admitting: Internal Medicine

## 2013-03-11 ENCOUNTER — Other Ambulatory Visit: Payer: PRIVATE HEALTH INSURANCE

## 2013-03-11 ENCOUNTER — Telehealth: Payer: Self-pay | Admitting: *Deleted

## 2013-03-11 ENCOUNTER — Ambulatory Visit (HOSPITAL_BASED_OUTPATIENT_CLINIC_OR_DEPARTMENT_OTHER): Payer: PRIVATE HEALTH INSURANCE

## 2013-03-11 ENCOUNTER — Telehealth: Payer: Self-pay | Admitting: Internal Medicine

## 2013-03-11 VITALS — BP 125/80 | HR 85 | Temp 98.1°F | Resp 20 | Ht 70.0 in | Wt 223.8 lb

## 2013-03-11 DIAGNOSIS — E039 Hypothyroidism, unspecified: Secondary | ICD-10-CM

## 2013-03-11 DIAGNOSIS — I728 Aneurysm of other specified arteries: Secondary | ICD-10-CM

## 2013-03-11 DIAGNOSIS — C8120 Mixed cellularity classical Hodgkin lymphoma, unspecified site: Secondary | ICD-10-CM

## 2013-03-11 DIAGNOSIS — C8128 Mixed cellularity classical Hodgkin lymphoma, lymph nodes of multiple sites: Secondary | ICD-10-CM

## 2013-03-11 DIAGNOSIS — E079 Disorder of thyroid, unspecified: Secondary | ICD-10-CM

## 2013-03-11 DIAGNOSIS — Z809 Family history of malignant neoplasm, unspecified: Secondary | ICD-10-CM

## 2013-03-11 DIAGNOSIS — I10 Essential (primary) hypertension: Secondary | ICD-10-CM

## 2013-03-11 DIAGNOSIS — I519 Heart disease, unspecified: Secondary | ICD-10-CM

## 2013-03-11 DIAGNOSIS — E669 Obesity, unspecified: Secondary | ICD-10-CM

## 2013-03-11 DIAGNOSIS — E119 Type 2 diabetes mellitus without complications: Secondary | ICD-10-CM

## 2013-03-11 DIAGNOSIS — Z5111 Encounter for antineoplastic chemotherapy: Secondary | ICD-10-CM

## 2013-03-11 LAB — COMPREHENSIVE METABOLIC PANEL (CC13)
ALK PHOS: 69 U/L (ref 40–150)
ALT: 45 U/L (ref 0–55)
AST: 27 U/L (ref 5–34)
Albumin: 3.6 g/dL (ref 3.5–5.0)
Anion Gap: 10 mEq/L (ref 3–11)
BUN: 11 mg/dL (ref 7.0–26.0)
CO2: 25 mEq/L (ref 22–29)
Calcium: 9.6 mg/dL (ref 8.4–10.4)
Chloride: 104 mEq/L (ref 98–109)
Creatinine: 0.9 mg/dL (ref 0.7–1.3)
GLUCOSE: 116 mg/dL (ref 70–140)
Potassium: 3.9 mEq/L (ref 3.5–5.1)
Sodium: 138 mEq/L (ref 136–145)
Total Bilirubin: 0.38 mg/dL (ref 0.20–1.20)
Total Protein: 7.6 g/dL (ref 6.4–8.3)

## 2013-03-11 LAB — CBC WITH DIFFERENTIAL/PLATELET
BASO%: 0.5 % (ref 0.0–2.0)
Basophils Absolute: 0 10*3/uL (ref 0.0–0.1)
EOS ABS: 0.2 10*3/uL (ref 0.0–0.5)
EOS%: 2.7 % (ref 0.0–7.0)
HEMATOCRIT: 40.6 % (ref 38.4–49.9)
HGB: 13.8 g/dL (ref 13.0–17.1)
LYMPH%: 59.6 % — ABNORMAL HIGH (ref 14.0–49.0)
MCH: 30 pg (ref 27.2–33.4)
MCHC: 34 g/dL (ref 32.0–36.0)
MCV: 88.3 fL (ref 79.3–98.0)
MONO#: 0.9 10*3/uL (ref 0.1–0.9)
MONO%: 12 % (ref 0.0–14.0)
NEUT%: 25.2 % — AB (ref 39.0–75.0)
NEUTROS ABS: 1.9 10*3/uL (ref 1.5–6.5)
NRBC: 0 % (ref 0–0)
PLATELETS: 270 10*3/uL (ref 140–400)
RBC: 4.6 10*6/uL (ref 4.20–5.82)
RDW: 14.7 % — ABNORMAL HIGH (ref 11.0–14.6)
WBC: 7.5 10*3/uL (ref 4.0–10.3)
lymph#: 4.5 10*3/uL — ABNORMAL HIGH (ref 0.9–3.3)

## 2013-03-11 LAB — LACTATE DEHYDROGENASE (CC13): LDH: 172 U/L (ref 125–245)

## 2013-03-11 MED ORDER — DEXAMETHASONE SODIUM PHOSPHATE 20 MG/5ML IJ SOLN
20.0000 mg | Freq: Once | INTRAMUSCULAR | Status: AC
  Administered 2013-03-11: 20 mg via INTRAVENOUS

## 2013-03-11 MED ORDER — DOXORUBICIN HCL CHEMO IV INJECTION 2 MG/ML
25.0000 mg/m2 | Freq: Once | INTRAVENOUS | Status: AC
  Administered 2013-03-11: 54 mg via INTRAVENOUS
  Filled 2013-03-11: qty 27

## 2013-03-11 MED ORDER — DEXAMETHASONE SODIUM PHOSPHATE 20 MG/5ML IJ SOLN
INTRAMUSCULAR | Status: AC
  Filled 2013-03-11: qty 5

## 2013-03-11 MED ORDER — SODIUM CHLORIDE 0.9 % IJ SOLN
10.0000 mL | INTRAMUSCULAR | Status: DC | PRN
  Administered 2013-03-11: 10 mL
  Filled 2013-03-11: qty 10

## 2013-03-11 MED ORDER — SODIUM CHLORIDE 0.9 % IV SOLN
10.0000 [IU]/m2 | Freq: Once | INTRAVENOUS | Status: AC
  Administered 2013-03-11: 22 [IU] via INTRAVENOUS
  Filled 2013-03-11: qty 7.33

## 2013-03-11 MED ORDER — ONDANSETRON 16 MG/50ML IVPB (CHCC)
INTRAVENOUS | Status: AC
  Filled 2013-03-11: qty 16

## 2013-03-11 MED ORDER — SODIUM CHLORIDE 0.9 % IV SOLN
Freq: Once | INTRAVENOUS | Status: AC
  Administered 2013-03-11: 15:00:00 via INTRAVENOUS

## 2013-03-11 MED ORDER — ONDANSETRON 16 MG/50ML IVPB (CHCC)
16.0000 mg | Freq: Once | INTRAVENOUS | Status: AC
  Administered 2013-03-11: 16 mg via INTRAVENOUS

## 2013-03-11 MED ORDER — VINBLASTINE SULFATE CHEMO INJECTION 1 MG/ML
6.0000 mg/m2 | Freq: Once | INTRAVENOUS | Status: AC
  Administered 2013-03-11: 13 mg via INTRAVENOUS
  Filled 2013-03-11: qty 13

## 2013-03-11 MED ORDER — HEPARIN SOD (PORK) LOCK FLUSH 100 UNIT/ML IV SOLN
500.0000 [IU] | Freq: Once | INTRAVENOUS | Status: AC | PRN
  Administered 2013-03-11: 500 [IU]
  Filled 2013-03-11: qty 5

## 2013-03-11 MED ORDER — SODIUM CHLORIDE 0.9 % IV SOLN
375.0000 mg/m2 | Freq: Once | INTRAVENOUS | Status: AC
  Administered 2013-03-11: 800 mg via INTRAVENOUS
  Filled 2013-03-11: qty 40

## 2013-03-11 NOTE — Telephone Encounter (Signed)
Gave pt apt for lab,md and emailed michelle regarding chemo, appt for lab and MD moved to 3/26 per nurse Tye [place], pt wants to come to chemo on 3/27, he will inform Sharyn Lull of his preference

## 2013-03-11 NOTE — Patient Instructions (Signed)
[facility] Discharge Instructions for Patients Receiving Chemotherapy  Today you received the following chemotherapy agents doxorubicin/vinblastine/bleomycin/dacarbazine  To help prevent nausea and vomiting after your treatment, we encourage you to take your nausea medication as directed.    If you develop nausea and vomiting that is not controlled by your nausea medication, call the clinic.   BELOW ARE SYMPTOMS THAT SHOULD BE REPORTED IMMEDIATELY:  *FEVER GREATER THAN 100.5 F  *CHILLS WITH OR WITHOUT FEVER  NAUSEA AND VOMITING THAT IS NOT CONTROLLED WITH YOUR NAUSEA MEDICATION  *UNUSUAL SHORTNESS OF BREATH  *UNUSUAL BRUISING OR BLEEDING  TENDERNESS IN MOUTH AND THROAT WITH OR WITHOUT PRESENCE OF ULCERS  *URINARY PROBLEMS  *BOWEL PROBLEMS  UNUSUAL RASH Items with * indicate a potential emergency and should be followed up as soon as possible.  Feel free to call the clinic you have any questions or concerns. The clinic phone number is [phone].  

## 2013-03-11 NOTE — Telephone Encounter (Signed)
Per staff message and POF I have scheduled appts.  JMW  

## 2013-03-11 NOTE — Telephone Encounter (Signed)
lvm that pt is late for appt. If he cannot get here soon we will need to reschedule.

## 2013-03-13 NOTE — Progress Notes (Signed)
[place] OFFICE PROGRESS NOTE  Pcp Not In System No address on file  DIAGNOSIS: Hodgkin's disease with mixed cellularity - Plan: CBC with Differential, Comprehensive metabolic panel (Cmet) - [facility], Lactate dehydrogenase (LDH) - [facility], CBC with Differential, Comprehensive metabolic panel (Cmet) - [facility], Lactate dehydrogenase (LDH) - [facility], NM PET Image Restag (PS) Skull Base To Thigh  Chief Complaint  Patient presents with  . Hodgkin's disease with mixed cellularity    CURRENT TREATMENT: ABVD q 28 days (Cycle #1 on 01/30; Cycle #2 on 02/27). Reference as detailed below.    Hodgkin's disease with mixed cellularity   12/03/2012 Imaging CT Neck: Lymphadenopathy left cervical chain with right-sided axillary LAD.  Heterogenous thyroid with an 9 mm isthmus lesion.     12/29/2012 Imaging CXR: No bulky LAD. No active cardiopulmonary disease.    12/31/2012 Pathology Classical Hodgkin's disease of mixed cellularity.    12/31/2012 Procedure Excisional biopsy of left cervical/supraclavicular LAD.    01/14/2013 Initial Diagnosis Hodgkin's disease with mixed cellularity, Stage IIIB. (night sweats).  ESR greater than 50. LDH of 297.  IPS, 3 points for male, age greater than 70, albumin less than 4; 60% 5 year FFP, 78% 5-year OS Haseelever D, [id number].    01/18/2013 Imaging Echo: LV EF 50-55%.  Abnormal L ventricular relaxation (grade 1 diastolic dysfunction). Referred to cardiology Rowland Lathe) with recommendations to repeat Echo s/p 4 cycles.    01/24/2013 Cancer Staging PET/CT: Hypermetabolic LAD identified w/in the neck, chest, abdomen, and pelvis.  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  CT C/A/P:  2 nonspecific pulmonary nodules LLL, 3 mm and RLL 4 mm.    01/25/2013 Bone Marrow Biopsy Bone marrow without lymphoma involvement.  Normal cytogenetics. Normocellular for age with trileage hematopoiesis without tumor identifed.    02/03/2013 Procedure R port-a-cath placment.     02/04/2013 Procedure S/p successful complex coil, plug and liquid embolization of left hepatic artery aneurysm by IR (Dr. Karin Golden)   02/11/2013 -  Chemotherapy Started ABVD q 28 days (Adriamycin 25 mg/m2, vinblastine 6 mg/m2, bleomycin 10 units/m2, dacarbazine 275 mg/m2.   based on Duggan DB, JCO, 2003. He is scheduled for cycle #2 on 03/11/2013.   02/11/2013 Family History Referral to genetics.  Jewish acenstry with hodgkin's disease in father; mother died of an abdominal aneurysm.      INTERVAL HISTORY: [name] [name] 48 y.o. male with a history of Classical Hodgkin's disease of mixed cellularity, Stage IIIB is here for one month follow up. He reports doing well overall.  He denies fevers or chills and he reports substantial decrease in nodules underneath his left axillary area.  He denies any further nightsweats or weight loss.  His appetite is good.  He denies alopecia.  He reports some diarrhea relieved with his medications.    MEDICAL HISTORY: Past Medical History  Diagnosis Date  . Hypertension   . Diabetes mellitus without complication     Pre-diabetes  . Depression   . Headache(784.0)   . Thyroid disease     Hx: of  . Cancer 2015    cervical lymphadenopathy  . Hodgkin lymphoma 2015  . Hepatic artery aneurysm 2015    INTERIM HISTORY: has Cervical lymphadenopathy; Hodgkin's disease with mixed cellularity; DM (diabetes mellitus); Hyperlipemia; Obesity; Hypothyroidism; Aneurysm artery, hepatic; Family history of cancer; Diastolic dysfunction; Thyroid lesion; Essential hypertension; At risk for coronary artery disease; and Chemotherapy-induced cardiomyopathy on his problem list.    ALLERGIES:  has No Known Allergies.  MEDICATIONS: has a current medication list which includes the following prescription(s): azelastine-fluticasone, carvedilol, dexamethasone, fexofenadine, levothyroxine, lidocaine-prilocaine, lisinopril, lorazepam, lovastatin, metformin, montelukast, naproxen  sodium, ondansetron, oxycodone, potassium chloride, prochlorperazine, and sertraline.  SURGICAL HISTORY:  Past Surgical History  Procedure Laterality Date  . Tonsillectomy    . Mass excision Left 12/31/2012    Procedure: CERVICAL LYMPH NODE BIOPSY;  Surgeon: [name], MD;  Location: [facility];  Service: General;  Laterality: Left;    REVIEW OF SYSTEMS:   Constitutional: Denies fevers, chills or abnormal weight loss Eyes: Denies blurriness of vision Ears, nose, mouth, throat, and face: Denies mucositis or sore throat Respiratory: Denies cough, dyspnea or wheezes Cardiovascular: Denies palpitation, chest discomfort or lower extremity swelling Gastrointestinal:  Denies nausea, heartburn; + diarrhea relieved with anti-diarrheal medication. Skin: Denies abnormal skin rashes Lymphatics: Denies new lymphadenopathy or easy bruising Neurological:Denies numbness, tingling or new weaknesses Behavioral/Psych: Mood is stable, no new changes  All other systems were reviewed with the patient and are negative.  PHYSICAL EXAMINATION: ECOG PERFORMANCE STATUS: 0 - Asymptomatic  Blood pressure 125/80, pulse 85, temperature 98.1 F (36.7 C), temperature source Oral, resp. rate 20, height 5' 10"  (1.778 m), weight 223 lb 12.8 oz (101.515 kg), SpO2 97.00%.  GENERAL:alert, no distress and comfortable; Well developed and well nourished.  SKIN: skin color, texture, turgor are normal, no rashes or significant lesions EYES: normal, Conjunctiva are pink and non-injected, sclera clear OROPHARYNX:no exudate, no erythema and lips, buccal mucosa, and tongue normal  NECK: supple, thyroid normal size, non-tender, without nodularity LYMPH:  no palpable lymphadenopathy in the cervical; small less a cm adenopathy in the R supraclavicular area.  Decreased adenopathy (less than 1 cm) in the L axillary.   LUNGS: clear to auscultation with normal breathing effort, no wheezes or rhonchi HEART: regular rate & rhythm and  no murmurs and no lower extremity edema ABDOMEN:abdomen soft, non-tender and normal bowel sounds Musculoskeletal:no cyanosis of digits and no clubbing  NEURO: alert & oriented x 3 with fluent speech, no focal motor/sensory deficits  Labs:  Lab Results  Component Value Date   WBC 7.5 03/11/2013   HGB 13.8 03/11/2013   HCT 40.6 03/11/2013   MCV 88.3 03/11/2013   PLT 270 03/11/2013   NEUTROABS 1.9 03/11/2013      Chemistry      Component Value Date/Time   NA 138 03/11/2013 1308   NA 137 02/05/2013 0528   K 3.9 03/11/2013 1308   K 4.1 02/05/2013 0528   CL 98 02/05/2013 0528   CO2 25 03/11/2013 1308   CO2 28 02/05/2013 0528   BUN 11.0 03/11/2013 1308   BUN 14 02/05/2013 0528   CREATININE 0.9 03/11/2013 1308   CREATININE 0.99 02/05/2013 0528      Component Value Date/Time   CALCIUM 9.6 03/11/2013 1308   CALCIUM 9.0 02/05/2013 0528   ALKPHOS 69 03/11/2013 1308   ALKPHOS 97 02/05/2013 0528   AST 27 03/11/2013 1308   AST 24 02/05/2013 0528   ALT 45 03/11/2013 1308   ALT 12 02/05/2013 0528   BILITOT 0.38 03/11/2013 1308   BILITOT 0.3 02/05/2013 0528       Basic Metabolic Panel:  Recent Labs Lab 03/11/13 1308  NA 138  K 3.9  CO2 25  GLUCOSE 116  BUN 11.0  CREATININE 0.9  CALCIUM 9.6   GFR Estimated Creatinine Clearance: 121.1 ml/min (by C-G formula based on Cr of 0.9). Liver Function Tests:  Recent Labs Lab 03/11/13 1308  AST  27  ALT 45  ALKPHOS 69  BILITOT 0.38  PROT 7.6  ALBUMIN 3.6    CBC:  Recent Labs Lab 03/11/13 1309  WBC 7.5  NEUTROABS 1.9  HGB 13.8  HCT 40.6  MCV 88.3  PLT 270   Results for AADITH, RAUDENBUSH (MRN [id number]) as of 03/13/2013 11:33  Ref. Range 01/28/2013 15:04  Sed Rate Latest Range: 0-16 mm/hr 123 (H)   Results for NORMON, PETTIJOHN (MRN [id number]) as of 03/13/2013 11:33  Ref. Range 01/14/2013 10:47 01/28/2013 15:04 02/25/2013 12:34 03/11/2013 13:08  LDH Latest Range: 125-245 U/L 285 (H) 297 (H) 197 172   Results for WALLER, MARCUSSEN (MRN  [id number]) as of 03/13/2013 11:33  Ref. Range 02/11/2013 11:06  TSH Latest Range: 0.320-4.118 m(IU)/L 9.659 (H)   Results for JONATHAN, CORPUS (MRN [id number]) as of 03/13/2013 11:33  Ref. Range 02/11/2013 11:06  Free T4 Latest Range: 0.80-1.80 ng/dL 1.31   Studies:  No results found.   RADIOGRAPHIC STUDIES: No results found.  ASSESSMENT: [name] [name] 48 y.o. male with a history of Hodgkin's disease with mixed cellularity - Plan: CBC with Differential, Comprehensive metabolic panel (Cmet) - [facility], Lactate dehydrogenase (LDH) - [facility], CBC with Differential, Comprehensive metabolic panel (Cmet) - [facility], Lactate dehydrogenase (LDH) - [facility], NM PET Image Restag (PS) Skull Base To Thigh   PLAN:   PLAN:  1. Classical Hodgkin's Disease with mixed cellularity, c/w Stage IIIB (if pulmonary nodules which are less than 1cm are negative). Bone marrow is negative for lymphoma. IPS for Hodgkin's disease (3 points for male, age greater than 100, albumin less than 4; 60% 5 year FFP, 78% 5-year overall survival based on Haseelever D, NEJM, 1998).   --We will continue cheomtherapy with ABVD x 6 cycles (category 2A) q28 days (doxorubicin, bleomycin, vinblastine, and dacarbazine) (Duggan DB, JCO, 2003). We had a detailed discussion regarding the benefits of including approximately 80 percent of patients with advanced stage HL will attain a complete response after treatment with ABVD. Up to one-quarter of patients will have disease progression requiring further therapy; half of those will have long term survival with autologous hematopoietic cell transplantation. Overall survival rates at 4, 7, and 10 years are approximately 90, 75, and 55 percent, respectively.  --The risks of ABVD was provided as a handout and the patient attened chemotherapy teaching on 01/26. These risks include but are not limited to bone marrow suppression which can result in life-threatening infections, hair loss, GI disturbances, fatigue,  nausea/vomiting. He understood the benefits and risk and chose to proceed. We will give Day #1 chemotherapy as follows):    Day 1, Cycle 2A 03/11/2013  Doxorubicin (Adriamycin) 25 mg/m2  Vinblastine 6 mg/m2  Bleomycin 10 units/m2  Dacarbazine 375 mg/m2   -He will have Day1, Cycle 1B on 03/13. We will check weekly cbc to determine count recovery. We will obtain an restaging PET-CT following Cycle #2 (NCCN Guidelines, Version 2.2014, HODG-9) to assess response and further treatment based on [place] criteria for response. This has been scheduled 04/06/2013.   --He has an excellent performance status and night sweats are concerning for a B-symptom. His CBC demonstrates mild anemia with normal plts. Both LDH and uric acid level is elevated. His creatinine is normal. His calcium and serum potassium is normal. He has small to medium tumor burden but low risk for tumor lysis syndrome Nadara Mustard Dustin, et al, the Tumor Lysis Syndrome. NEJM, 2011; [id number]:6734) and will therefore be hydrated aggressively with close monitoring. We  tested hepatitis and HIV with were negative.   2. Left Hepatic artery aneurysm S/p embolization by IR on 02-04-2013.  --He had follow-up Dr. Geroge Baseman for consideration of coil embolization on 02/04/2013. His liver enzymes remain normal limits. He was counseled to report any symptoms of abdominal pain and avoid any sports or high energy exercises. He voiced understanding and agreement with this plan.   3. Heterogeneous thyroid with a in 9 mm isthmus lesion.  --Consider further evaluation with thyroid ultrasound. If patient is clinically hyperthyroid, consider nuclear medicine thyroid uptake and scan.  --TSH, Free T4 as indicated above.  May require increase in synthroid.    4. Diastolic Dysfunction, Grade I.  --Echocardiogram as noted above. Patient has a history of DM2, Hypertension. Repeat Echocardiogram following cycle #4.   4. Family History of cancer, cancer prior to age of 19.   --Patient has Jewish ancestry and reports father was also diagnosed with hodgkin's disease. He has an appointment with Genetics for consultation.   5. DM2.  --Continue Metformin 500 mg daily per PCP/endo.   6. Hypertension/HLD/CAD.  --Continue lisinopril 10 mg daily.  --Continue hydrochlorothiazide 50 mg daily with KCL 10 mEQ daily.  --Continue Lovastatin 500 mg daily.   7. Hypothyroidism  --Continue levothyroxine 200 mg daily per endo. Checked TSH, T4. TSH slightly high. Thyroid ultrasound as needed. (see #4)   8. Obesity  --Continue dieting and weight lost.   9. Follow-up.  --Patient will require follow-up in 4 weeks for consideration of cycle #3 of ABVD. He will check labs biweekly with chemotherapy. PETCT following 2 cycles on 04/06/2013.   All questions were answered. The patient knows to call the clinic with any problems, questions or concerns. We can certainly see the patient much sooner if necessary.  I spent 15 minutes counseling the patient face to face. The total time spent in the appointment was 25 minutes.    Judee Hennick, MD 03/13/2013 11:41 AM

## 2013-03-16 ENCOUNTER — Encounter: Payer: Self-pay | Admitting: Cardiology

## 2013-03-16 ENCOUNTER — Telehealth: Payer: Self-pay | Admitting: Internal Medicine

## 2013-03-16 NOTE — Telephone Encounter (Signed)
Talked to pt and gave him appt for [facility] lab,MD and chemo, he will come by and get calendar appt

## 2013-03-22 ENCOUNTER — Encounter: Payer: Self-pay | Admitting: Emergency Medicine

## 2013-03-25 ENCOUNTER — Other Ambulatory Visit: Payer: Self-pay | Admitting: Internal Medicine

## 2013-03-25 ENCOUNTER — Ambulatory Visit (HOSPITAL_BASED_OUTPATIENT_CLINIC_OR_DEPARTMENT_OTHER): Payer: PRIVATE HEALTH INSURANCE

## 2013-03-25 ENCOUNTER — Other Ambulatory Visit (HOSPITAL_BASED_OUTPATIENT_CLINIC_OR_DEPARTMENT_OTHER): Payer: PRIVATE HEALTH INSURANCE

## 2013-03-25 ENCOUNTER — Other Ambulatory Visit: Payer: Self-pay

## 2013-03-25 VITALS — BP 115/81 | HR 77 | Temp 97.5°F | Resp 18

## 2013-03-25 DIAGNOSIS — Z5111 Encounter for antineoplastic chemotherapy: Secondary | ICD-10-CM

## 2013-03-25 DIAGNOSIS — C8120 Mixed cellularity classical Hodgkin lymphoma, unspecified site: Secondary | ICD-10-CM

## 2013-03-25 DIAGNOSIS — C8129 Mixed cellularity classical Hodgkin lymphoma, extranodal and solid organ sites: Secondary | ICD-10-CM

## 2013-03-25 LAB — CBC WITH DIFFERENTIAL/PLATELET
BASO%: 0.8 % (ref 0.0–2.0)
Basophils Absolute: 0.1 10*3/uL (ref 0.0–0.1)
EOS ABS: 0.2 10*3/uL (ref 0.0–0.5)
EOS%: 2.6 % (ref 0.0–7.0)
HCT: 36.9 % — ABNORMAL LOW (ref 38.4–49.9)
HGB: 12.8 g/dL — ABNORMAL LOW (ref 13.0–17.1)
LYMPH%: 43 % (ref 14.0–49.0)
MCH: 30.4 pg (ref 27.2–33.4)
MCHC: 34.6 g/dL (ref 32.0–36.0)
MCV: 87.8 fL (ref 79.3–98.0)
MONO#: 1 10*3/uL — ABNORMAL HIGH (ref 0.1–0.9)
MONO%: 14.8 % — AB (ref 0.0–14.0)
NEUT#: 2.6 10*3/uL (ref 1.5–6.5)
NEUT%: 38.8 % — ABNORMAL LOW (ref 39.0–75.0)
PLATELETS: 268 10*3/uL (ref 140–400)
RBC: 4.2 10*6/uL (ref 4.20–5.82)
RDW: 14.9 % — AB (ref 11.0–14.6)
WBC: 6.7 10*3/uL (ref 4.0–10.3)
lymph#: 2.9 10*3/uL (ref 0.9–3.3)

## 2013-03-25 LAB — COMPREHENSIVE METABOLIC PANEL (CC13)
ALK PHOS: 75 U/L (ref 40–150)
ALT: 40 U/L (ref 0–55)
AST: 24 U/L (ref 5–34)
Albumin: 3.3 g/dL — ABNORMAL LOW (ref 3.5–5.0)
Anion Gap: 11 mEq/L (ref 3–11)
BILIRUBIN TOTAL: 0.26 mg/dL (ref 0.20–1.20)
BUN: 12.3 mg/dL (ref 7.0–26.0)
CO2: 23 mEq/L (ref 22–29)
Calcium: 9.7 mg/dL (ref 8.4–10.4)
Chloride: 107 mEq/L (ref 98–109)
Creatinine: 1 mg/dL (ref 0.7–1.3)
GLUCOSE: 155 mg/dL — AB (ref 70–140)
Potassium: 4.4 mEq/L (ref 3.5–5.1)
SODIUM: 141 meq/L (ref 136–145)
TOTAL PROTEIN: 7.2 g/dL (ref 6.4–8.3)

## 2013-03-25 LAB — LACTATE DEHYDROGENASE (CC13): LDH: 172 U/L (ref 125–245)

## 2013-03-25 MED ORDER — SODIUM CHLORIDE 0.9 % IJ SOLN
10.0000 mL | INTRAMUSCULAR | Status: DC | PRN
  Administered 2013-03-25: 10 mL
  Filled 2013-03-25: qty 10

## 2013-03-25 MED ORDER — DEXAMETHASONE SODIUM PHOSPHATE 20 MG/5ML IJ SOLN
20.0000 mg | Freq: Once | INTRAMUSCULAR | Status: AC
  Administered 2013-03-25: 20 mg via INTRAVENOUS

## 2013-03-25 MED ORDER — HEPARIN SOD (PORK) LOCK FLUSH 100 UNIT/ML IV SOLN
500.0000 [IU] | Freq: Once | INTRAVENOUS | Status: AC | PRN
  Administered 2013-03-25: 500 [IU]
  Filled 2013-03-25: qty 5

## 2013-03-25 MED ORDER — SODIUM CHLORIDE 0.9 % IV SOLN
Freq: Once | INTRAVENOUS | Status: AC
  Administered 2013-03-25: 10:00:00 via INTRAVENOUS

## 2013-03-25 MED ORDER — SODIUM CHLORIDE 0.9 % IV SOLN
10.0000 [IU]/m2 | Freq: Once | INTRAVENOUS | Status: AC
  Administered 2013-03-25: 22 [IU] via INTRAVENOUS
  Filled 2013-03-25: qty 7.33

## 2013-03-25 MED ORDER — VINBLASTINE SULFATE CHEMO INJECTION 1 MG/ML
6.0000 mg/m2 | Freq: Once | INTRAVENOUS | Status: AC
  Administered 2013-03-25: 13 mg via INTRAVENOUS
  Filled 2013-03-25: qty 13

## 2013-03-25 MED ORDER — SODIUM CHLORIDE 0.9 % IV SOLN
375.0000 mg/m2 | Freq: Once | INTRAVENOUS | Status: AC
  Administered 2013-03-25: 800 mg via INTRAVENOUS
  Filled 2013-03-25: qty 40

## 2013-03-25 MED ORDER — DOXORUBICIN HCL CHEMO IV INJECTION 2 MG/ML
25.0000 mg/m2 | Freq: Once | INTRAVENOUS | Status: AC
  Administered 2013-03-25: 54 mg via INTRAVENOUS
  Filled 2013-03-25: qty 27

## 2013-03-25 MED ORDER — ONDANSETRON 16 MG/50ML IVPB (CHCC)
16.0000 mg | Freq: Once | INTRAVENOUS | Status: AC
  Administered 2013-03-25: 16 mg via INTRAVENOUS

## 2013-03-25 MED ORDER — FENOFIBRATE 145 MG PO TABS: 145.0000 mg | ORAL_TABLET | Freq: Every day | ORAL | Status: DC

## 2013-03-25 NOTE — Patient Instructions (Signed)
[facility] Discharge Instructions for Patients Receiving Chemotherapy  Today you received the following chemotherapy agents Adriamycin, Velban, Bleocin, DTIC (dacarbazine).   To help prevent nausea and vomiting after your treatment, we encourage you to take your nausea medication as prescribed.   If you develop nausea and vomiting that is not controlled by your nausea medication, call the clinic.   BELOW ARE SYMPTOMS THAT SHOULD BE REPORTED IMMEDIATELY:  *FEVER GREATER THAN 100.5 F  *CHILLS WITH OR WITHOUT FEVER  NAUSEA AND VOMITING THAT IS NOT CONTROLLED WITH YOUR NAUSEA MEDICATION  *UNUSUAL SHORTNESS OF BREATH  *UNUSUAL BRUISING OR BLEEDING  TENDERNESS IN MOUTH AND THROAT WITH OR WITHOUT PRESENCE OF ULCERS  *URINARY PROBLEMS  *BOWEL PROBLEMS  UNUSUAL RASH Items with * indicate a potential emergency and should be followed up as soon as possible.  Feel free to call the clinic should you have any questions or concerns. The clinic phone number is [phone].  It was my pleasure to take care of you today!  Tina Ashara Lounsbury, RN    

## 2013-03-28 ENCOUNTER — Ambulatory Visit (HOSPITAL_BASED_OUTPATIENT_CLINIC_OR_DEPARTMENT_OTHER): Payer: PRIVATE HEALTH INSURANCE | Admitting: Genetic Counselor

## 2013-03-28 ENCOUNTER — Other Ambulatory Visit: Payer: PRIVATE HEALTH INSURANCE

## 2013-03-28 ENCOUNTER — Encounter: Payer: Self-pay | Admitting: Genetic Counselor

## 2013-03-28 DIAGNOSIS — C8190 Hodgkin lymphoma, unspecified, unspecified site: Secondary | ICD-10-CM

## 2013-03-28 DIAGNOSIS — Z809 Family history of malignant neoplasm, unspecified: Secondary | ICD-10-CM

## 2013-03-28 DIAGNOSIS — C8120 Mixed cellularity classical Hodgkin lymphoma, unspecified site: Secondary | ICD-10-CM

## 2013-03-28 DIAGNOSIS — IMO0002 Reserved for concepts with insufficient information to code with codable children: Secondary | ICD-10-CM

## 2013-03-28 DIAGNOSIS — Z807 Family history of other malignant neoplasms of lymphoid, hematopoietic and related tissues: Secondary | ICD-10-CM

## 2013-03-28 NOTE — Progress Notes (Signed)
Dr.  Concha Norway requested a consultation for genetic counseling and risk assessment for [name] [name], a 48 y.o. male, for discussion of his personal history of Hodgkin's lymphoma and family history of Hodgkin's lymphoma and other cancers.  He presents to clinic today to discuss the possibility of a genetic predisposition to cancer, and to further clarify his risks, as well as his family members' risks for cancer.   HISTORY OF PRESENT ILLNESS: In 2014, at the age of 63, [name] [name] was diagnosed with Hodgkin's lymphoma. This was treated with excision and chemotherapy. At the time of his diagnosis he was found to have a hepatic artery aneurysm.   Past Medical History  Diagnosis Date  . Hypertension   . Diabetes mellitus without complication     Pre-diabetes  . Depression   . Headache(784.0)   . Thyroid disease     Hx: of  . Cancer 2015    cervical lymphadenopathy  . Hodgkin lymphoma 2015  . Hepatic artery aneurysm 2015    Past Surgical History  Procedure Laterality Date  . Tonsillectomy    . Mass excision Left 12/31/2012    Procedure: CERVICAL LYMPH NODE BIOPSY;  Surgeon: [name], MD;  Location: [facility];  Service: General;  Laterality: Left;    History   Social History  . Marital Status: Divorced    Spouse Name: N/A    Number of Children: 0  . Years of Education: N/A   Occupational History  .     Social History Main Topics  . Smoking status: Former Smoker -- 1.00 packs/day for 31 years    Types: Cigarettes    Quit date: 06/07/2012  . Smokeless tobacco: Never Used  . Alcohol Use: No  . Drug Use: No  . Sexual Activity: None   Other Topics Concern  . None   Social History Narrative  . None    FAMILY HISTORY:  We obtained a detailed, 4-generation family history.  Significant diagnoses are listed below: Family History  Problem Relation Age of Onset  . Aneurysm Mother 48    brain  . Obesity Mother   . Heart disease Mother   . AAA (abdominal  aortic aneurysm) Mother     dx in her 63s  . Hodgkin's lymphoma Father     dx in his 52s, and again in his 70s  . Diabetes Father   . Cancer Brother     NOS dx in his 66s  . Bone cancer Maternal Uncle 40    don't know if primary or metastises  The patient does not know much information about his family history.  He states that his parents did not discuss their health nor talk about their family health history.  Most of his knowledge comes from his older maternal half sister and [email or web address]  Patient's maternal ancestors are of [place] and [place] descent, and paternal ancestors are of [place] descent. There is reported Ashkenazi Jewish ancestry. There is no known consanguinity.  GENETIC COUNSELING ASSESSMENT: [name] [name] is a 48 y.o. male with a personal history of lymphoma and family history of lymphoma and other cancer which somewhat suggestive of a predisposition to cancer, but NOT diagnostic of a hereditary cancer syndrome. We, therefore, discussed and recommended the following at today's visit.   DISCUSSION: We reviewed the characteristics, features and inheritance patterns of hereditary cancer syndromes. We also discussed genetic testing, including the appropriate family members to test, the process of testing, insurance coverage and turn-around-time for  results. We discussed that some cancers can have a specific hereditary pattern and can be passed from parent to child, other's present a predisposition for cancer.  At this time, Hodgkin's lymphoma is not a primary part of a hereditary cancer syndrome.   The patient has Jewish ancestry, so we discussed BRCA mutations in the face of his ancestry, but no cancer history that is suggestive of this condition.  The patient was not interested in purusing genetic testing at this time.  I will look further into syndromes associated with Hodkin's lymphoma and those associated with aneurysms.  If I find anything that would pertain to his case, I  will recontact him.  PLAN: After considering the risks, benefits, and limitations, [name] [name] declined BRCA testing based on his Jewish ancestry. We encouraged [name] [name] to remain in contact with cancer genetics annually so that we can continuously update the family history and inform him of any changes in cancer genetics and testing that may be of benefit for his family. [name] [name]'s questions were answered to his satisfaction today. Our contact information was provided should additional questions or concerns arise.  The patient was seen for a total of 45 minutes, greater than 50% of which was spent face-to-face counseling.  This note will also be sent to the referring provider via the electronic medical record. The patient will be supplied with a summary of this genetic counseling discussion as well as educational information on the discussed hereditary cancer syndromes following the conclusion of their visit.   Patient was discussed with Dr. [name].   _______________________________________________________________________ For Office Staff:  Number of people involved in session: 1 Was an Intern/ student involved with case: yes

## 2013-04-03 ENCOUNTER — Other Ambulatory Visit: Payer: Self-pay | Admitting: Internal Medicine

## 2013-04-05 ENCOUNTER — Ambulatory Visit
Admission: RE | Admit: 2013-04-05 | Discharge: 2013-04-05 | Disposition: A | Discharge status: Home / Self Care | Payer: PRIVATE HEALTH INSURANCE | Source: Ambulatory Visit | Attending: Interventional Radiology | Admitting: Interventional Radiology

## 2013-04-05 ENCOUNTER — Inpatient Hospital Stay: Admission: RE | Admit: 2013-04-05 | Payer: PRIVATE HEALTH INSURANCE | Source: Ambulatory Visit

## 2013-04-05 ENCOUNTER — Ambulatory Visit
Admission: RE | Admit: 2013-04-05 | Discharge: 2013-04-05 | Disposition: A | Discharge status: Home / Self Care | Payer: PRIVATE HEALTH INSURANCE | Source: Ambulatory Visit | Attending: Radiology | Admitting: Radiology

## 2013-04-05 DIAGNOSIS — I728 Aneurysm of other specified arteries: Secondary | ICD-10-CM

## 2013-04-05 MED ORDER — IOHEXOL 350 MG/ML SOLN
100.0000 mL | Freq: Once | INTRAVENOUS | Status: AC | PRN
  Administered 2013-04-05: 100 mL via INTRAVENOUS

## 2013-04-06 ENCOUNTER — Ambulatory Visit (HOSPITAL_COMMUNITY)
Admission: RE | Admit: 2013-04-06 | Discharge: 2013-04-06 | Disposition: A | Discharge status: Home / Self Care | Payer: PRIVATE HEALTH INSURANCE | Source: Ambulatory Visit | Attending: Internal Medicine | Admitting: Internal Medicine

## 2013-04-06 DIAGNOSIS — C8190 Hodgkin lymphoma, unspecified, unspecified site: Secondary | ICD-10-CM | POA: Insufficient documentation

## 2013-04-06 DIAGNOSIS — C8120 Mixed cellularity classical Hodgkin lymphoma, unspecified site: Secondary | ICD-10-CM

## 2013-04-06 DIAGNOSIS — K838 Other specified diseases of biliary tract: Secondary | ICD-10-CM | POA: Insufficient documentation

## 2013-04-06 DIAGNOSIS — R599 Enlarged lymph nodes, unspecified: Secondary | ICD-10-CM | POA: Insufficient documentation

## 2013-04-06 DIAGNOSIS — I728 Aneurysm of other specified arteries: Secondary | ICD-10-CM | POA: Insufficient documentation

## 2013-04-06 DIAGNOSIS — R911 Solitary pulmonary nodule: Secondary | ICD-10-CM | POA: Insufficient documentation

## 2013-04-06 DIAGNOSIS — I2510 Atherosclerotic heart disease of native coronary artery without angina pectoris: Secondary | ICD-10-CM | POA: Insufficient documentation

## 2013-04-06 DIAGNOSIS — K7689 Other specified diseases of liver: Secondary | ICD-10-CM | POA: Insufficient documentation

## 2013-04-06 DIAGNOSIS — I517 Cardiomegaly: Secondary | ICD-10-CM | POA: Insufficient documentation

## 2013-04-06 LAB — GLUCOSE, CAPILLARY: Glucose-Capillary: 201 mg/dL — ABNORMAL HIGH (ref 70–99)

## 2013-04-06 MED ORDER — FLUDEOXYGLUCOSE F - 18 (FDG) INJECTION
11.0000 | Freq: Once | INTRAVENOUS | Status: AC | PRN
  Administered 2013-04-06: 11 via INTRAVENOUS

## 2013-04-07 ENCOUNTER — Ambulatory Visit (HOSPITAL_BASED_OUTPATIENT_CLINIC_OR_DEPARTMENT_OTHER): Payer: PRIVATE HEALTH INSURANCE | Admitting: Internal Medicine

## 2013-04-07 ENCOUNTER — Telehealth: Payer: Self-pay | Admitting: Internal Medicine

## 2013-04-07 ENCOUNTER — Other Ambulatory Visit (HOSPITAL_BASED_OUTPATIENT_CLINIC_OR_DEPARTMENT_OTHER): Payer: PRIVATE HEALTH INSURANCE

## 2013-04-07 ENCOUNTER — Ambulatory Visit: Payer: PRIVATE HEALTH INSURANCE

## 2013-04-07 VITALS — BP 126/78 | HR 87 | Temp 97.5°F | Resp 18 | Ht 70.0 in | Wt 226.7 lb

## 2013-04-07 DIAGNOSIS — C8128 Mixed cellularity classical Hodgkin lymphoma, lymph nodes of multiple sites: Secondary | ICD-10-CM

## 2013-04-07 DIAGNOSIS — C8120 Mixed cellularity classical Hodgkin lymphoma, unspecified site: Secondary | ICD-10-CM

## 2013-04-07 DIAGNOSIS — E119 Type 2 diabetes mellitus without complications: Secondary | ICD-10-CM

## 2013-04-07 DIAGNOSIS — E039 Hypothyroidism, unspecified: Secondary | ICD-10-CM

## 2013-04-07 DIAGNOSIS — I10 Essential (primary) hypertension: Secondary | ICD-10-CM

## 2013-04-07 LAB — COMPREHENSIVE METABOLIC PANEL (CC13)
ALBUMIN: 3.3 g/dL — AB (ref 3.5–5.0)
ALT: 36 U/L (ref 0–55)
AST: 22 U/L (ref 5–34)
Alkaline Phosphatase: 70 U/L (ref 40–150)
Anion Gap: 13 mEq/L — ABNORMAL HIGH (ref 3–11)
BUN: 11.3 mg/dL (ref 7.0–26.0)
CO2: 23 mEq/L (ref 22–29)
Calcium: 9.6 mg/dL (ref 8.4–10.4)
Chloride: 102 mEq/L (ref 98–109)
Creatinine: 1 mg/dL (ref 0.7–1.3)
GLUCOSE: 219 mg/dL — AB (ref 70–140)
POTASSIUM: 4.3 meq/L (ref 3.5–5.1)
Sodium: 138 mEq/L (ref 136–145)
TOTAL PROTEIN: 7.1 g/dL (ref 6.4–8.3)
Total Bilirubin: 0.27 mg/dL (ref 0.20–1.20)

## 2013-04-07 LAB — CBC WITH DIFFERENTIAL/PLATELET
BASO%: 0.9 % (ref 0.0–2.0)
Basophils Absolute: 0.1 10*3/uL (ref 0.0–0.1)
EOS ABS: 0.2 10*3/uL (ref 0.0–0.5)
EOS%: 2.6 % (ref 0.0–7.0)
HCT: 35.9 % — ABNORMAL LOW (ref 38.4–49.9)
HGB: 12.5 g/dL — ABNORMAL LOW (ref 13.0–17.1)
LYMPH%: 45.5 % (ref 14.0–49.0)
MCH: 30.3 pg (ref 27.2–33.4)
MCHC: 35 g/dL (ref 32.0–36.0)
MCV: 86.6 fL (ref 79.3–98.0)
MONO#: 0.8 10*3/uL (ref 0.1–0.9)
MONO%: 13 % (ref 0.0–14.0)
NEUT%: 38 % — ABNORMAL LOW (ref 39.0–75.0)
NEUTROS ABS: 2.3 10*3/uL (ref 1.5–6.5)
Platelets: 299 10*3/uL (ref 140–400)
RBC: 4.14 10*6/uL — ABNORMAL LOW (ref 4.20–5.82)
RDW: 14.8 % — AB (ref 11.0–14.6)
WBC: 6 10*3/uL (ref 4.0–10.3)
lymph#: 2.7 10*3/uL (ref 0.9–3.3)

## 2013-04-07 LAB — LACTATE DEHYDROGENASE (CC13): LDH: 174 U/L (ref 125–245)

## 2013-04-07 MED ORDER — LORAZEPAM 0.5 MG PO TABS: 0.5000 mg | ORAL_TABLET | Freq: Four times a day (QID) | ORAL | Status: DC | PRN

## 2013-04-07 NOTE — Progress Notes (Signed)
[place] OFFICE PROGRESS NOTE  Pcp Not In System No address on file  DIAGNOSIS: Hodgkin's disease with mixed cellularity - Plan: LORazepam (ATIVAN) 0.5 MG tablet, CBC with Differential, Basic metabolic panel (Bmet) - [facility], CBC with Differential, Comprehensive metabolic panel (Cmet) - [facility], Lactate dehydrogenase (LDH) - [facility], Sedimentation rate  Chief Complaint  Patient presents with  . Hodgkin's disease with mixed cellularity    CURRENT TREATMENT: ABVD q 2 weeks started on 02/11/2013. He is scheduled for cycle #3, day #1 on 04/08/2013.     Hodgkin's disease with mixed cellularity   12/03/2012 Imaging CT Neck: Lymphadenopathy left cervical chain with right-sided axillary LAD.  Heterogenous thyroid with an 9 mm isthmus lesion.     12/29/2012 Imaging CXR: No bulky LAD. No active cardiopulmonary disease.    12/31/2012 Pathology Classical Hodgkin's disease of mixed cellularity.    12/31/2012 Procedure Excisional biopsy of left cervical/supraclavicular LAD.    01/14/2013 Initial Diagnosis Hodgkin's disease with mixed cellularity, Stage IIIB. (night sweats).  ESR greater than 50. LDH of 297.  IPS, 3 points for male, age greater than 28, albumin less than 4; 60% 5 year FFP, 78% 5-year OS Haseelever D, [id number].    01/18/2013 Imaging Echo: LV EF 50-55%.  Abnormal L ventricular relaxation (grade 1 diastolic dysfunction). Referred to cardiology [facility] Lathe) with recommendations to repeat Echo s/p 4 cycles.    01/24/2013 Cancer Staging PET/CT: Hypermetabolic LAD identified w/in the neck, chest, abdomen, and pelvis.  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  CT C/A/P:  2 nonspecific pulmonary nodules LLL, 3 mm and RLL 4 mm.    01/25/2013 Bone Marrow Biopsy Bone marrow without lymphoma involvement.  Normal cytogenetics. Normocellular for age with trileage hematopoiesis without tumor identifed.    02/03/2013 Procedure R port-a-cath placment.    02/04/2013 Procedure S/p successful  complex coil, plug and liquid embolization of left hepatic artery aneurysm by IR (Dr. Karin Golden)   02/11/2013 -  Chemotherapy Started ABVD q 28 days (Adriamycin 25 mg/m2, vinblastine 6 mg/m2, bleomycin 10 units/m2, dacarbazine 275 mg/m2.   based on Duggan DB, JCO, 2003. He is scheduled for cycle #2 on 03/11/2013.   02/11/2013 Family History Referral to genetics.  Jewish acenstry with hodgkin's disease in father; mother died of an abdominal aneurysm.    04/06/2013 Imaging PET/CT: Significantly improved thoracic, retroperitoneal, and pelvic lymphadenopathy, as described above, max SUV 2.9. Deauville score 2.        INTERVAL HISTORY: [name] [name] 48 y.o. male with a history of classical hodgkin's disease of mixed cellularity, stage III b is here for one month follow-up.  He had an interim PET/CT on yesterday which demonstrated a significant response to chemotherapy.  He continues to deny fevers or chills or further night sweats or weight loss.  He denies any breathing problems.  He reports good energy and appetite.  He saw Dr. Geroge Baseman for follow up regarding his hepatic embolization.  Imaging including a CTA of abdomen was within normal limits.  He also had MRI/MRA of the brain which was without evidence of aneurysms.  He is scheduled to follow up with Dr. Geroge Baseman in one year.   MEDICAL HISTORY: Past Medical History  Diagnosis Date  . Hypertension   . Diabetes mellitus without complication     Pre-diabetes  . Depression   . Headache(784.0)   . Thyroid disease     Hx: of  . Cancer 2015    cervical lymphadenopathy  . Hodgkin lymphoma  2015  . Hepatic artery aneurysm 2015    INTERIM HISTORY: has Cervical lymphadenopathy; Hodgkin's disease with mixed cellularity; DM (diabetes mellitus); Hyperlipemia; Obesity; Hypothyroidism; Aneurysm artery, hepatic; Family history of cancer; Diastolic dysfunction; Thyroid lesion; Essential hypertension; At risk for coronary artery disease; and  Chemotherapy-induced cardiomyopathy on his problem list.    ALLERGIES:  has No Known Allergies.  MEDICATIONS: has a current medication list which includes the following prescription(s): azelastine-fluticasone, carvedilol, dexamethasone, eszopiclone, fenofibrate, fexofenadine, levothyroxine, lidocaine-prilocaine, lisinopril, lorazepam, lovastatin, metformin, montelukast, naproxen sodium, ondansetron, oxycodone, potassium chloride, prochlorperazine, sertraline, and vitamin d (ergocalciferol).  SURGICAL HISTORY:  Past Surgical History  Procedure Laterality Date  . Tonsillectomy    . Mass excision Left 12/31/2012    Procedure: CERVICAL LYMPH NODE BIOPSY;  Surgeon: [name], MD;  Location: [facility];  Service: General;  Laterality: Left;    REVIEW OF SYSTEMS:   Constitutional: Denies fevers, chills or abnormal weight loss Eyes: Denies blurriness of vision Ears, nose, mouth, throat, and face: Denies mucositis or sore throat Respiratory: Denies cough, dyspnea or wheezes Cardiovascular: Denies palpitation, chest discomfort or lower extremity swelling Gastrointestinal:  Denies nausea, heartburn or change in bowel habits Skin: Denies abnormal skin rashes Lymphatics: Denies new lymphadenopathy or easy bruising Neurological:Denies numbness, tingling or new weaknesses Behavioral/Psych: Mood is stable, no new changes  All other systems were reviewed with the patient and are negative.  PHYSICAL EXAMINATION: ECOG PERFORMANCE STATUS: 0 - Asymptomatic  Blood pressure 126/78, pulse 87, temperature 97.5 F (36.4 C), temperature source Oral, resp. rate 18, height 5' 10"  (1.778 m), weight 226 lb 11.2 oz (102.83 kg), SpO2 95.00%.  GENERAL:alert, no distress and comfortable; well developed and well nourished.  SKIN: skin color, texture, turgor are normal, no rashes or significant lesions; R portacath. EYES: normal, Conjunctiva are pink and non-injected, sclera clear OROPHARYNX:no exudate, no erythema  and lips, buccal mucosa, and tongue normal  NECK: supple, thyroid normal size, non-tender, without nodularity LYMPH:  no palpable lymphadenopathy in the cervical; small less a cm adenopathy in the R supraclavicular area. Decreased adenopathy (less than 0.5 cm) in the L axillary. LUNGS: clear to auscultation with normal breathing effort, no wheezes or rhonchi HEART: regular rate & rhythm and no murmurs and no lower extremity edema ABDOMEN:abdomen soft, non-tender and normal bowel sounds Musculoskeletal:no cyanosis of digits and no clubbing  NEURO: alert & oriented x 3 with fluent speech, no focal motor/sensory deficits  Labs:  Lab Results  Component Value Date   WBC 6.0 04/07/2013   HGB 12.5* 04/07/2013   HCT 35.9* 04/07/2013   MCV 86.6 04/07/2013   PLT 299 04/07/2013   NEUTROABS 2.3 04/07/2013      Chemistry      Component Value Date/Time   NA 138 04/07/2013 0809   NA 137 02/05/2013 0528   K 4.3 04/07/2013 0809   K 4.1 02/05/2013 0528   CL 98 02/05/2013 0528   CO2 23 04/07/2013 0809   CO2 28 02/05/2013 0528   BUN 11.3 04/07/2013 0809   BUN 14 02/05/2013 0528   CREATININE 1.0 04/07/2013 0809   CREATININE 0.99 02/05/2013 0528      Component Value Date/Time   CALCIUM 9.6 04/07/2013 0809   CALCIUM 9.0 02/05/2013 0528   ALKPHOS 70 04/07/2013 0809   ALKPHOS 97 02/05/2013 0528   AST 22 04/07/2013 0809   AST 24 02/05/2013 0528   ALT 36 04/07/2013 0809   ALT 12 02/05/2013 0528   BILITOT 0.27 04/07/2013 0809   BILITOT 0.3  02/05/2013 0528       Basic Metabolic Panel:  Recent Labs Lab 04/07/13 0809  NA 138  K 4.3  CO2 23  GLUCOSE 219*  BUN 11.3  CREATININE 1.0  CALCIUM 9.6   GFR Estimated Creatinine Clearance: 109.7 ml/min (by C-G formula based on Cr of 1). Liver Function Tests:  Recent Labs Lab 04/07/13 0809  AST 22  ALT 36  ALKPHOS 70  BILITOT 0.27  PROT 7.1  ALBUMIN 3.3*   No results found for this basename: LIPASE, AMYLASE,  in the last 168 hours No results found for this  basename: AMMONIA,  in the last 168 hours Coagulation profile No results found for this basename: INR, PROTIME,  in the last 168 hours  CBC:  Recent Labs Lab 04/07/13 0809  WBC 6.0  NEUTROABS 2.3  HGB 12.5*  HCT 35.9*  MCV 86.6  PLT 299    Anemia work up No results found for this basename: VITAMINB12, FOLATE, FERRITIN, TIBC, IRON, RETICCTPCT,  in the last 72 hours  Studies:  No results found.   RADIOGRAPHIC STUDIES: Ct Angio Abdomen W/cm &/or Wo Contrast  04/05/2013   CLINICAL DATA:  Post hepatic artery embolization.  EXAM: CT ANGIOGRAPHY ABDOMEN  TECHNIQUE: Multidetector CT imaging of the abdomen was performed using the standard protocol during bolus administration of intravenous contrast. Multiplanar reconstructed images including MIPs were obtained and reviewed to evaluate the vascular anatomy.  CONTRAST:  111m OMNIPAQUE IOHEXOL 350 MG/ML SOLN  COMPARISON:  01/24/2013 and angiogram 02/02/2013  FINDINGS: Vascular structures: The visualized pulmonary arteries are patent. Normal appearance of the descending thoracic aorta. Normal appearance of the abdominal aorta without aneurysm or dissection.  The celiac trunk is patent. There is flow in the left gastric artery and splenic artery. The left hepatic artery originates from the aorta. Again noted is a large left hepatic artery aneurysm containing high-density areas and calcifications. There is no flow within the aneurysm sac. The aneurysm sac measures 8.5 x 5.4 cm and previously measured 8.5 x 5.8 cm.  The superior mesenteric artery is patent. The right hepatic artery originates from the proximal superior mesenteric artery. There is a small saccular aneurysm along the anterior aspect of the superior mesenteric artery just distal to the replaced right hepatic artery. This aneurysm roughly measures 5 mm and not significantly changed. The superior mesenteric artery is patent.  There are two left accessory renal arteries. The main renal arteries  are patent without stenosis or dilatation. Inferior mesenteric artery is patent. The proximal common iliac arteries are patent.  Nonvascular structures:  Lung bases demonstrate a poorly defined nodule in the superior segment of the right lower lobe, measuring 4 mm. This is essentially unchanged. There is also a stable nodule along the anterior right upper lobe on image 2. Stable punctate nodule in the right middle lobe on image 8. Stable linear density in the right middle lobe on image 17. Low-attenuation of the liver suggests hepatic steatosis. The retroperitoneal nodal tissue has decreased in size. The nodal tissue just inferior to the main left renal artery measures 1.9 cm in the short axis on sequence 5, image 85 and previously measured 2.8 cm. Index lesion near the proximal left common iliac artery measures 2.3 cm on sequence 5, image 118 and previously measured 3.4 cm.  Stable appearance of the adrenal glands, kidneys, spleen, pancreas and gallbladder. Patient has coronary artery calcifications. Normal appearance of the visualized small and large bowel. There is stable enlargement of the spinal canal  at L1 with a low-density lesion along the left side causing remodeling of the adjacent bone. This is best seen on sequence 5, image 73. This lesion could represent a schwannoma or nerve sheath tumor. No acute bone abnormality.  Review of the MIP images confirms the above findings.  IMPRESSION: The left hepatic artery aneurysm has been successfully embolized. There is no vascular flow within this large aneurysm. There has been minimal change in the aneurysm sac size.  Stable saccular aneurysm along the anterior superior mesenteric artery just distal to the replaced right hepatic artery. This roughly measures 5 mm and not significantly changed. Recommend attention this on follow up imaging.  Decreased lymphadenopathy in the retroperitoneum. Findings consistent with a positive response to therapy.  Decreased  attenuation of the liver is consistent with hepatic steatosis.  Stable punctate nodular densities throughout both lungs. Recommend attention on follow up imaging.  Stable low-density lesion along the left side of the spinal canal at L1. This could represent a cystic-type structure. A schwannoma or nerve sheath tumor is in the differential diagnosis. Suspect this is a benign finding.   Electronically Signed   By: Markus Daft M.D.   On: 04/05/2013 15:30   Mr Virgel Paling Wo Contrast  04/05/2013   CLINICAL DATA:  48 year old male with a giant hepatic artery status post endovascular embolization. Query intracranial aneurysm. Initial encounter.  EXAM: MRA HEAD WITHOUT CONTRAST  TECHNIQUE: Angiographic images of the Circle of Willis were obtained using MRA technique without intravenous contrast.  COMPARISON:  Neck CT 12/06/2012.  FINDINGS: Antegrade flow in the posterior circulation with artifactual loss of flow signal in the proximal V4 segments. The distal right vertebral artery is mildly dominant. Both distal vertebral arteries and the basilar are tortuous, but patent without stenosis. Normal left PICA origin. The right AICA may be dominant. Normal right SCA origin, the left SCA is diminutive or absent. Normal PCA origins. Posterior communicating arteries are diminutive or absent. Normal bilateral PCA branches.  Antegrade flow in both ICA siphons. Tortuous distal cervical ICAs, stable. Mildly tortuous cavernous segments. No ICA stenosis. Normal ophthalmic artery origins. Normal carotid termini. Normal MCA and ACA origins.  Anterior communicating artery and visualized ACA branches are within normal limits. The ACA is are mildly tortuous. Visualized left MCA branches are within normal limits.  The right MCA M1 segment is normal. The right MCA bifurcation is patent. Just beyond the right MCA bifurcation there is a 2 mm focus of flow signal (series 3, image 88, series 9, image 2) which is appears most likely to be related to  a small M2 branch (that arises from the M1 on series 3, image 85 and continues on image 90). No bon a fide intracranial aneurysm.  IMPRESSION: Tortuous cerebral vessels.  No intracranial aneurysm; conspicuous but normal 2 mm right MCA branch vessel segment suspected on series 3, image 88.   Electronically Signed   By: Lars Pinks M.D.   On: 04/05/2013 13:14   Nm Pet Image Restag (ps) Skull Base To Thigh  04/06/2013   CLINICAL DATA:  Subsequent treatment strategy for Hodgkin's lymphoma with mixed cellularity. Status post 2 cycles of ABVD.  EXAM: NUCLEAR MEDICINE PET SKULL BASE TO THIGH  TECHNIQUE: 11.0 mCi F-18 FDG was injected intravenously. Full-ring PET imaging was performed from the skull base to thigh after the radiotracer. CT data was obtained and used for attenuation correction and anatomic localization.  FASTING BLOOD GLUCOSE:  Value: 201 mg/dl  COMPARISON:  PET-CT and  CT chest abdomen pelvis dated 01/24/2013. CTA abdomen dated 04/05/2013.  FINDINGS: NECK  7 mm short axis left intraparotid lymph node (series 4/ image 19), without appreciable hypermetabolism relative to blood pool (max SUV 1.5), previously 4.1.  CHEST  Left supraclavicular nodes measuring up to 14 mm short axis (series 4/ image 35), max SUV 2.3, previously 2.0 cm with max SUV 9.2.  Bilateral axillary nodes, measuring up to 16 mm short axis on the right (series 4/image 63), max SUV 2.7, previously 2.8 cm with max SUV 7.9.  Small mediastinal nodes measuring up to 9 mm short axis (series 4/image 63), without residual hypermetabolism.  3 mm subpleural nodule medially in the superior segment right lower lobe (series 8/image 30), unchanged.  Cardiomegaly. Three-vessel coronary atherosclerosis. Right chest port terminating at the cavoatrial junction.  ABDOMEN/PELVIS  No abnormal hypermetabolic activity within the liver, pancreas, adrenal glands, or spleen.  Mild hepatic steatosis.  Layering sludge in the gallbladder.  Embolized large left hepatic  artery aneurysm, measuring approximately 7 x 9 x 5.4 cm, unchanged. Previously described 5 mm saccular aneurysm along the anterior aspect of the SMA is not evident on this unenhanced study.  Retroperitoneal/pelvic lymphadenopathy, improved, including:  --1.9 cm short axis left para-aortic node (series 4/ image 127), max SUV 2.6, previously 3.0 cm with max SUV 9.2  --2.5 cm short axis left common iliac node (series 4/ image 146), max SUV 2.9, previously 3.7 cm with max SUV 9.4  --13 mm right external iliac node (series 4/ image 182), max SUV 2.0, previously 1.9 cm with max SUV 9.1  SKELETON  No focal hypermetabolic activity to suggest skeletal metastasis.  Deauville criteria: The dominant nodes match but do not exceed in the background activity of the mediastinum, corresponding to a Deauville score 2.  IMPRESSION: Significantly improved thoracic, retroperitoneal, and pelvic lymphadenopathy, as described above, max SUV 2.9.  Deauville score 2.  Additional ancillary findings as above.   Electronically Signed   By: Julian Hy M.D.   On: 04/06/2013 10:20   [facility] Radiologist Eval & Mgmt  04/05/2013   EXAM: ESTABLISHED PATIENT OFFICE VISIT -LEVEL II [phone])  CHIEF COMPLAINT: Left hepatic artery aneurysm  Current Pain Level: 1  HISTORY OF PRESENT ILLNESS: [name] [name] is a 48 year old male with recently diagnosed classical Hodgkin's lymphoma. 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 He underwent semi elective endovascular repair on February 04, 2013. He presents for his initial follow-up evaluation.  [name] [name] has been feeling relatively well since his procedure. He has had no issues with abdominal pain, tenderness, nausea or vomiting. His right groin feels well healed. No concerns for hematoma or infection.  His chemotherapy sessions have been going reasonably well. He suffers with with intermittent diarrhea and fatigue as his main adverse  symptoms. Occasionally, his portacatheter bothers him when he wakes. He feels this is due to laying on it. He has had no fevers, chills or other symptoms.  Overall, he is very happy with his clinical course and fills relatively asymptomatic in regard to the hepatic artery aneurysm.  PHYSICAL EXAMINATION: Vitals: Temperature 98.1 degrees, blood pressure 125/81, heart rate 106, respiratory rate 17, oxygen saturation 96% on room air.  General: Well-nourished, well-developed male and in no acute distress. He is pleasant and gauge.  Pulmonary:  Breathing comfortably at rest.  Abdomen: Soft, nontender and nondistended.  Chest: Well-healed port catheter incision. No evidence of erythema, induration or fluctuance. No  tenderness to palpation at this time.  ASSESSMENT AND PLAN: Doing very well 2 months status post coil embolization of giant left hepatic artery aneurysm. His brain MRA today demonstrated no evidence of aneurysm. CTA of the abdomen demonstrated successful devascularization of the giant left hepatic artery aneurysm. 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  1. I will see Mr. Kelson again in 1 year. We will repeat the brain MRA (to be performed at [place] at [facility] or [facility]) and abdominal CTA. If the brain MRA remains negative, no further intracranial imaging will be required. He will likely need several followup CTAs of the abdomen both to evaluate the embolized hepatic artery aneurysm and to follow the very mild aneurysmal dilatation of the SMA. Signed,  Criselda Peaches, MD  [facility]  [facility]   Electronically Signed   By: [name] M.D.   On: 04/05/2013 15:14    ASSESSMENT: [name] [name] 48 y.o. male with a history of Hodgkin's disease with mixed cellularity - Plan: LORazepam (ATIVAN) 0.5 MG tablet, CBC  with Differential, Basic metabolic panel (Bmet) - [facility], CBC with Differential, Comprehensive metabolic panel (Cmet) - [facility], Lactate dehydrogenase (LDH) - [facility], Sedimentation rate   PLAN:  1. Classical Hodgkin's Disease with mixed cellularity, c/w Stage IIIB (if pulmonary nodules which are less than 1cm are negative). Bone marrow is negative for lymphoma. IPS for Hodgkin's disease (3 points for male, age greater than 41, albumin less than 4; 60% 5 year FFP, 78% 5-year overall survival based on Haseelever D, NEJM, 1998).   --We will continue chemotherapy with ABVD x 6 cycles (category 2A) q28 days (doxorubicin, bleomycin, vinblastine, and dacarbazine) (Duggan DB, JCO, 2003). We had a detailed discussion regarding the benefits of including approximately 80 percent of patients with advanced stage HL will attain a complete response after treatment with ABVD. Up to one-quarter of patients will have disease progression requiring further therapy; half of those will have long term survival with autologous hematopoietic cell transplantation. Overall survival rates at 4, 7, and 10 years are approximately 90, 75, and 55 percent, respectively.  --The risks of ABVD was provided as a handout and the patient attened chemotherapy teaching on 01/26. These risks include but are not limited to bone marrow suppression which can result in life-threatening infections, hair loss, GI disturbances, fatigue, nausea/vomiting. He understood the benefits and risk and chose to proceed. We will continue Day #1 chemotherapy as follows):   Day 1, Cycle 3A 04/08/2013  Doxorubicin (Adriamycin) 25 mg/m2  Vinblastine 6 mg/m2  Bleomycin 10 units/m2  Dacarbazine 375 mg/m2   -He will have Day15, Cycle 3B on 04/10. We will check weekly cbc to determine count recovery. We obtained an restaging PET-CT following Cycle #2 (NCCN Guidelines, Version 2.2014, HODG-9) to assess response and further treatment based on deaville criteria for response.  De[place]ille criteria was 2 so therefore we will continue with this ABVD therapy.  2. Left Hepatic artery aneurysm S/p embolization by IR on 02-04-2013.  --He had follow-up with Dr. Geroge Baseman s/p coil embolization on 02/04/2013.  His MRI of brain and CTA was as noted above.  He will continue annual follow up with Dr. Geroge Baseman. His liver enzymes remain normal limits. He was counseled to report any symptoms of abdominal pain and avoid any sports or high energy exercises. He voiced understanding and agreement with this plan.  3. Heterogeneous thyroid with a in 9 mm isthmus lesion.  --Consider further evaluation with thyroid ultrasound. If patient is clinically hyperthyroid, consider nuclear medicine thyroid uptake and scan.    4. Diastolic Dysfunction, Grade I.  --Echocardiogram as noted above. Patient has a history of DM2, Hypertension. Repeat Echocardiogram following cycle #4.   4. Family History of cancer, cancer prior to age of 15.  --Patient has Jewish ancestry and reports father was also diagnosed with hodgkin's disease. He has an appointment with Genetics for consultation.   5. DM2.  --Continue Metformin 500 mg daily per PCP/endo.   6. Hypertension/HLD/CAD.  --Continue lisinopril 10 mg daily.  --Continue hydrochlorothiazide 50 mg daily with KCL 10 mEQ daily.  --Continue Lovastatin 500 mg daily.  7. Hypothyroidism  --Continue levothyroxine 200 mg daily per endo. Checked TSH, T4. TSH slightly high. Thyroid ultrasound as needed. (see #4)   8. Obesity  --Continue dieting and weight lost.   9. Follow-up.  --Patient will require follow-up in 4 weeks for consideration of cycle #4 of ABVD. He will check labs biweekly with chemotherapy.  All questions were answered. The patient knows to call the clinic with any problems, questions or concerns. We can certainly see the patient much sooner if necessary.  I spent 15 minutes counseling the patient face to face. The total time spent in the  appointment was 25 minutes.    Rylinn Linzy, MD 04/10/2013 5:02 PM

## 2013-04-07 NOTE — Telephone Encounter (Signed)
gv pt appt schedule for march thru [facility]

## 2013-04-08 ENCOUNTER — Ambulatory Visit (HOSPITAL_BASED_OUTPATIENT_CLINIC_OR_DEPARTMENT_OTHER): Payer: PRIVATE HEALTH INSURANCE

## 2013-04-08 VITALS — BP 124/81 | HR 94 | Temp 98.3°F | Resp 18

## 2013-04-08 DIAGNOSIS — Z5111 Encounter for antineoplastic chemotherapy: Secondary | ICD-10-CM

## 2013-04-08 DIAGNOSIS — C8128 Mixed cellularity classical Hodgkin lymphoma, lymph nodes of multiple sites: Secondary | ICD-10-CM

## 2013-04-08 DIAGNOSIS — C8120 Mixed cellularity classical Hodgkin lymphoma, unspecified site: Secondary | ICD-10-CM

## 2013-04-08 MED ORDER — DOXORUBICIN HCL CHEMO IV INJECTION 2 MG/ML
25.0000 mg/m2 | Freq: Once | INTRAVENOUS | Status: AC
  Administered 2013-04-08: 54 mg via INTRAVENOUS
  Filled 2013-04-08: qty 27

## 2013-04-08 MED ORDER — ONDANSETRON 16 MG/50ML IVPB (CHCC)
INTRAVENOUS | Status: AC
  Filled 2013-04-08: qty 16

## 2013-04-08 MED ORDER — DEXAMETHASONE SODIUM PHOSPHATE 20 MG/5ML IJ SOLN
INTRAMUSCULAR | Status: AC
  Filled 2013-04-08: qty 5

## 2013-04-08 MED ORDER — DEXAMETHASONE SODIUM PHOSPHATE 20 MG/5ML IJ SOLN
20.0000 mg | Freq: Once | INTRAMUSCULAR | Status: AC
  Administered 2013-04-08: 20 mg via INTRAVENOUS

## 2013-04-08 MED ORDER — SODIUM CHLORIDE 0.9 % IV SOLN
375.0000 mg/m2 | Freq: Once | INTRAVENOUS | Status: AC
  Administered 2013-04-08: 800 mg via INTRAVENOUS
  Filled 2013-04-08: qty 40

## 2013-04-08 MED ORDER — VINBLASTINE SULFATE CHEMO INJECTION 1 MG/ML
6.0000 mg/m2 | Freq: Once | INTRAVENOUS | Status: AC
  Administered 2013-04-08: 13 mg via INTRAVENOUS
  Filled 2013-04-08: qty 13

## 2013-04-08 MED ORDER — SODIUM CHLORIDE 0.9 % IV SOLN
Freq: Once | INTRAVENOUS | Status: AC
  Administered 2013-04-08: 15:00:00 via INTRAVENOUS

## 2013-04-08 MED ORDER — SODIUM CHLORIDE 0.9 % IJ SOLN
10.0000 mL | INTRAMUSCULAR | Status: DC | PRN
  Administered 2013-04-08: 10 mL
  Filled 2013-04-08: qty 10

## 2013-04-08 MED ORDER — ONDANSETRON 16 MG/50ML IVPB (CHCC)
16.0000 mg | Freq: Once | INTRAVENOUS | Status: AC
  Administered 2013-04-08: 16 mg via INTRAVENOUS

## 2013-04-08 MED ORDER — SODIUM CHLORIDE 0.9 % IV SOLN
10.0000 [IU]/m2 | Freq: Once | INTRAVENOUS | Status: AC
  Administered 2013-04-08: 22 [IU] via INTRAVENOUS
  Filled 2013-04-08: qty 7.33

## 2013-04-08 MED ORDER — HEPARIN SOD (PORK) LOCK FLUSH 100 UNIT/ML IV SOLN
500.0000 [IU] | Freq: Once | INTRAVENOUS | Status: AC | PRN
  Administered 2013-04-08: 500 [IU]
  Filled 2013-04-08: qty 5

## 2013-04-08 NOTE — Patient Instructions (Signed)
[facility] Discharge Instructions for Patients Receiving Chemotherapy  Today you received the following chemotherapy agents Adriamycin, Bleocin, Velban, DTIC.  To help prevent nausea and vomiting after your treatment, we encourage you to take your nausea medication as prescribed.   If you develop nausea and vomiting that is not controlled by your nausea medication, call the clinic.   BELOW ARE SYMPTOMS THAT SHOULD BE REPORTED IMMEDIATELY:  *FEVER GREATER THAN 100.5 F  *CHILLS WITH OR WITHOUT FEVER  NAUSEA AND VOMITING THAT IS NOT CONTROLLED WITH YOUR NAUSEA MEDICATION  *UNUSUAL SHORTNESS OF BREATH  *UNUSUAL BRUISING OR BLEEDING  TENDERNESS IN MOUTH AND THROAT WITH OR WITHOUT PRESENCE OF ULCERS  *URINARY PROBLEMS  *BOWEL PROBLEMS  UNUSUAL RASH Items with * indicate a potential emergency and should be followed up as soon as possible.  Feel free to call the clinic should you have any questions or concerns. The clinic phone number is [phone].

## 2013-04-13 ENCOUNTER — Encounter: Payer: Self-pay | Admitting: Internal Medicine

## 2013-04-15 NOTE — Progress Notes (Signed)
UPDATE FAXED [place]., RN CASE MANAGER AT [facility]  [phone]. PHONE # IS [phone] [phone].

## 2013-04-15 NOTE — Progress Notes (Signed)
FAXED [place]., RN CASE MANAGER AT [facility]  [phone]. PHONE # IS [phone] EXT [phone].

## 2013-04-16 ENCOUNTER — Other Ambulatory Visit: Payer: Self-pay | Admitting: Internal Medicine

## 2013-04-21 ENCOUNTER — Other Ambulatory Visit: Payer: Self-pay

## 2013-04-22 ENCOUNTER — Ambulatory Visit (HOSPITAL_BASED_OUTPATIENT_CLINIC_OR_DEPARTMENT_OTHER): Payer: PRIVATE HEALTH INSURANCE

## 2013-04-22 ENCOUNTER — Other Ambulatory Visit (HOSPITAL_BASED_OUTPATIENT_CLINIC_OR_DEPARTMENT_OTHER): Payer: PRIVATE HEALTH INSURANCE

## 2013-04-22 ENCOUNTER — Other Ambulatory Visit: Payer: Self-pay | Admitting: Internal Medicine

## 2013-04-22 VITALS — BP 109/69 | HR 89 | Temp 99.4°F | Resp 18

## 2013-04-22 DIAGNOSIS — C8120 Mixed cellularity classical Hodgkin lymphoma, unspecified site: Secondary | ICD-10-CM

## 2013-04-22 DIAGNOSIS — Z5111 Encounter for antineoplastic chemotherapy: Secondary | ICD-10-CM

## 2013-04-22 DIAGNOSIS — C8128 Mixed cellularity classical Hodgkin lymphoma, lymph nodes of multiple sites: Secondary | ICD-10-CM

## 2013-04-22 LAB — CBC WITH DIFFERENTIAL/PLATELET
BASO%: 1.5 % (ref 0.0–2.0)
BASOS ABS: 0.1 10*3/uL (ref 0.0–0.1)
EOS%: 2.3 % (ref 0.0–7.0)
Eosinophils Absolute: 0.2 10*3/uL (ref 0.0–0.5)
HEMATOCRIT: 36.9 % — AB (ref 38.4–49.9)
HGB: 12.7 g/dL — ABNORMAL LOW (ref 13.0–17.1)
LYMPH#: 3 10*3/uL (ref 0.9–3.3)
LYMPH%: 41.9 % (ref 14.0–49.0)
MCH: 30.2 pg (ref 27.2–33.4)
MCHC: 34.3 g/dL (ref 32.0–36.0)
MCV: 88 fL (ref 79.3–98.0)
MONO#: 1.1 10*3/uL — ABNORMAL HIGH (ref 0.1–0.9)
MONO%: 15.2 % — ABNORMAL HIGH (ref 0.0–14.0)
NEUT#: 2.8 10*3/uL (ref 1.5–6.5)
NEUT%: 39.1 % (ref 39.0–75.0)
Platelets: 299 10*3/uL (ref 140–400)
RBC: 4.19 10*6/uL — ABNORMAL LOW (ref 4.20–5.82)
RDW: 14.9 % — ABNORMAL HIGH (ref 11.0–14.6)
WBC: 7.2 10*3/uL (ref 4.0–10.3)

## 2013-04-22 LAB — BASIC METABOLIC PANEL (CC13)
Anion Gap: 9 mEq/L (ref 3–11)
BUN: 10.6 mg/dL (ref 7.0–26.0)
CALCIUM: 9.9 mg/dL (ref 8.4–10.4)
CHLORIDE: 102 meq/L (ref 98–109)
CO2: 27 mEq/L (ref 22–29)
CREATININE: 1.1 mg/dL (ref 0.7–1.3)
Glucose: 173 mg/dl — ABNORMAL HIGH (ref 70–140)
Potassium: 4.2 mEq/L (ref 3.5–5.1)
Sodium: 138 mEq/L (ref 136–145)

## 2013-04-22 MED ORDER — SODIUM CHLORIDE 0.9 % IJ SOLN
10.0000 mL | INTRAMUSCULAR | Status: DC | PRN
  Administered 2013-04-22: 10 mL
  Filled 2013-04-22: qty 10

## 2013-04-22 MED ORDER — DEXAMETHASONE SODIUM PHOSPHATE 20 MG/5ML IJ SOLN
20.0000 mg | Freq: Once | INTRAMUSCULAR | Status: AC
  Administered 2013-04-22: 20 mg via INTRAVENOUS

## 2013-04-22 MED ORDER — ONDANSETRON 16 MG/50ML IVPB (CHCC)
INTRAVENOUS | Status: AC
  Filled 2013-04-22: qty 16

## 2013-04-22 MED ORDER — ONDANSETRON 16 MG/50ML IVPB (CHCC)
16.0000 mg | Freq: Once | INTRAVENOUS | Status: AC
  Administered 2013-04-22: 16 mg via INTRAVENOUS

## 2013-04-22 MED ORDER — SODIUM CHLORIDE 0.9 % IV SOLN
375.0000 mg/m2 | Freq: Once | INTRAVENOUS | Status: AC
  Administered 2013-04-22: 800 mg via INTRAVENOUS
  Filled 2013-04-22: qty 40

## 2013-04-22 MED ORDER — HEPARIN SOD (PORK) LOCK FLUSH 100 UNIT/ML IV SOLN
500.0000 [IU] | Freq: Once | INTRAVENOUS | Status: AC | PRN
  Administered 2013-04-22: 500 [IU]
  Filled 2013-04-22: qty 5

## 2013-04-22 MED ORDER — SODIUM CHLORIDE 0.9 % IV SOLN
10.0000 [IU]/m2 | Freq: Once | INTRAVENOUS | Status: AC
  Administered 2013-04-22: 22 [IU] via INTRAVENOUS
  Filled 2013-04-22: qty 7.33

## 2013-04-22 MED ORDER — DEXAMETHASONE SODIUM PHOSPHATE 20 MG/5ML IJ SOLN
INTRAMUSCULAR | Status: AC
  Filled 2013-04-22: qty 5

## 2013-04-22 MED ORDER — SODIUM CHLORIDE 0.9 % IV SOLN
Freq: Once | INTRAVENOUS | Status: AC
  Administered 2013-04-22: 14:00:00 via INTRAVENOUS

## 2013-04-22 MED ORDER — DOXORUBICIN HCL CHEMO IV INJECTION 2 MG/ML
25.0000 mg/m2 | Freq: Once | INTRAVENOUS | Status: AC
  Administered 2013-04-22: 54 mg via INTRAVENOUS
  Filled 2013-04-22: qty 27

## 2013-04-22 MED ORDER — VINBLASTINE SULFATE CHEMO INJECTION 1 MG/ML
6.0000 mg/m2 | Freq: Once | INTRAVENOUS | Status: AC
  Administered 2013-04-22: 13 mg via INTRAVENOUS
  Filled 2013-04-22: qty 13

## 2013-04-22 NOTE — Patient Instructions (Signed)
[facility] Discharge Instructions for Patients Receiving Chemotherapy  Today you received the following chemotherapy agents Adriamycin, Vinblastine, Bleomycin and DTIC.  To help prevent nausea and vomiting after your treatment, we encourage you to take your nausea medication.   If you develop nausea and vomiting that is not controlled by your nausea medication, call the clinic.   BELOW ARE SYMPTOMS THAT SHOULD BE REPORTED IMMEDIATELY:  *FEVER GREATER THAN 100.5 F  *CHILLS WITH OR WITHOUT FEVER  NAUSEA AND VOMITING THAT IS NOT CONTROLLED WITH YOUR NAUSEA MEDICATION  *UNUSUAL SHORTNESS OF BREATH  *UNUSUAL BRUISING OR BLEEDING  TENDERNESS IN MOUTH AND THROAT WITH OR WITHOUT PRESENCE OF ULCERS  *URINARY PROBLEMS  *BOWEL PROBLEMS  UNUSUAL RASH Items with * indicate a potential emergency and should be followed up as soon as possible.  Feel free to call the clinic you have any questions or concerns. The clinic phone number is [phone].

## 2013-05-06 ENCOUNTER — Ambulatory Visit: Payer: PRIVATE HEALTH INSURANCE

## 2013-05-06 ENCOUNTER — Other Ambulatory Visit: Payer: PRIVATE HEALTH INSURANCE

## 2013-05-06 ENCOUNTER — Ambulatory Visit (HOSPITAL_BASED_OUTPATIENT_CLINIC_OR_DEPARTMENT_OTHER): Payer: PRIVATE HEALTH INSURANCE

## 2013-05-06 ENCOUNTER — Other Ambulatory Visit (HOSPITAL_BASED_OUTPATIENT_CLINIC_OR_DEPARTMENT_OTHER): Payer: PRIVATE HEALTH INSURANCE

## 2013-05-06 ENCOUNTER — Telehealth: Payer: Self-pay | Admitting: Internal Medicine

## 2013-05-06 ENCOUNTER — Ambulatory Visit (HOSPITAL_BASED_OUTPATIENT_CLINIC_OR_DEPARTMENT_OTHER): Payer: PRIVATE HEALTH INSURANCE | Admitting: Nurse Practitioner

## 2013-05-06 ENCOUNTER — Other Ambulatory Visit: Payer: Self-pay | Admitting: Oncology

## 2013-05-06 VITALS — BP 116/83 | HR 92 | Temp 97.2°F | Resp 20 | Ht 70.0 in | Wt 220.9 lb

## 2013-05-06 DIAGNOSIS — C8128 Mixed cellularity classical Hodgkin lymphoma, lymph nodes of multiple sites: Secondary | ICD-10-CM

## 2013-05-06 DIAGNOSIS — I10 Essential (primary) hypertension: Secondary | ICD-10-CM

## 2013-05-06 DIAGNOSIS — Z5111 Encounter for antineoplastic chemotherapy: Secondary | ICD-10-CM

## 2013-05-06 DIAGNOSIS — Z809 Family history of malignant neoplasm, unspecified: Secondary | ICD-10-CM

## 2013-05-06 DIAGNOSIS — C8120 Mixed cellularity classical Hodgkin lymphoma, unspecified site: Secondary | ICD-10-CM

## 2013-05-06 DIAGNOSIS — E119 Type 2 diabetes mellitus without complications: Secondary | ICD-10-CM

## 2013-05-06 LAB — CBC WITH DIFFERENTIAL/PLATELET
BASO%: 0.8 % (ref 0.0–2.0)
Basophils Absolute: 0 10*3/uL (ref 0.0–0.1)
EOS ABS: 0.2 10*3/uL (ref 0.0–0.5)
EOS%: 3.1 % (ref 0.0–7.0)
HCT: 34.7 % — ABNORMAL LOW (ref 38.4–49.9)
HGB: 12.2 g/dL — ABNORMAL LOW (ref 13.0–17.1)
LYMPH%: 45.9 % (ref 14.0–49.0)
MCH: 30.2 pg (ref 27.2–33.4)
MCHC: 35.2 g/dL (ref 32.0–36.0)
MCV: 85.9 fL (ref 79.3–98.0)
MONO#: 0.8 10*3/uL (ref 0.1–0.9)
MONO%: 15.9 % — ABNORMAL HIGH (ref 0.0–14.0)
NEUT%: 34.3 % — ABNORMAL LOW (ref 39.0–75.0)
NEUTROS ABS: 1.7 10*3/uL (ref 1.5–6.5)
NRBC: 0 % (ref 0–0)
PLATELETS: 280 10*3/uL (ref 140–400)
RBC: 4.04 10*6/uL — AB (ref 4.20–5.82)
RDW: 14.6 % (ref 11.0–14.6)
WBC: 4.8 10*3/uL (ref 4.0–10.3)
lymph#: 2.2 10*3/uL (ref 0.9–3.3)

## 2013-05-06 LAB — COMPREHENSIVE METABOLIC PANEL (CC13)
ALT: 32 U/L (ref 0–55)
AST: 25 U/L (ref 5–34)
Albumin: 3.5 g/dL (ref 3.5–5.0)
Alkaline Phosphatase: 56 U/L (ref 40–150)
Anion Gap: 13 mEq/L — ABNORMAL HIGH (ref 3–11)
BUN: 10.7 mg/dL (ref 7.0–26.0)
CO2: 22 mEq/L (ref 22–29)
Calcium: 10.1 mg/dL (ref 8.4–10.4)
Chloride: 104 mEq/L (ref 98–109)
Creatinine: 1.1 mg/dL (ref 0.7–1.3)
Glucose: 205 mg/dl — ABNORMAL HIGH (ref 70–140)
Potassium: 4.5 mEq/L (ref 3.5–5.1)
SODIUM: 138 meq/L (ref 136–145)
TOTAL PROTEIN: 7.3 g/dL (ref 6.4–8.3)
Total Bilirubin: 0.39 mg/dL (ref 0.20–1.20)

## 2013-05-06 LAB — LACTATE DEHYDROGENASE (CC13): LDH: 176 U/L (ref 125–245)

## 2013-05-06 LAB — SEDIMENTATION RATE: Sed Rate: 45 mm/hr — ABNORMAL HIGH (ref 0–16)

## 2013-05-06 MED ORDER — SODIUM CHLORIDE 0.9 % IJ SOLN
10.0000 mL | INTRAMUSCULAR | Status: DC | PRN
  Administered 2013-05-06: 10 mL
  Filled 2013-05-06: qty 10

## 2013-05-06 MED ORDER — VINBLASTINE SULFATE CHEMO INJECTION 1 MG/ML
6.0000 mg/m2 | Freq: Once | INTRAVENOUS | Status: AC
  Administered 2013-05-06: 13 mg via INTRAVENOUS
  Filled 2013-05-06: qty 13

## 2013-05-06 MED ORDER — SODIUM CHLORIDE 0.9 % IV SOLN
Freq: Once | INTRAVENOUS | Status: AC
  Administered 2013-05-06: 11:00:00 via INTRAVENOUS

## 2013-05-06 MED ORDER — ONDANSETRON 16 MG/50ML IVPB (CHCC)
16.0000 mg | Freq: Once | INTRAVENOUS | Status: AC
  Administered 2013-05-06: 16 mg via INTRAVENOUS

## 2013-05-06 MED ORDER — SODIUM CHLORIDE 0.9 % IV SOLN
375.0000 mg/m2 | Freq: Once | INTRAVENOUS | Status: AC
  Administered 2013-05-06: 800 mg via INTRAVENOUS
  Filled 2013-05-06: qty 40

## 2013-05-06 MED ORDER — HEPARIN SOD (PORK) LOCK FLUSH 100 UNIT/ML IV SOLN
500.0000 [IU] | Freq: Once | INTRAVENOUS | Status: AC | PRN
  Administered 2013-05-06: 500 [IU]
  Filled 2013-05-06: qty 5

## 2013-05-06 MED ORDER — DOXORUBICIN HCL CHEMO IV INJECTION 2 MG/ML
25.0000 mg/m2 | Freq: Once | INTRAVENOUS | Status: AC
  Administered 2013-05-06: 54 mg via INTRAVENOUS
  Filled 2013-05-06: qty 27

## 2013-05-06 MED ORDER — DEXAMETHASONE SODIUM PHOSPHATE 20 MG/5ML IJ SOLN
INTRAMUSCULAR | Status: AC
  Filled 2013-05-06: qty 5

## 2013-05-06 MED ORDER — DEXAMETHASONE SODIUM PHOSPHATE 20 MG/5ML IJ SOLN
20.0000 mg | Freq: Once | INTRAMUSCULAR | Status: AC
  Administered 2013-05-06: 20 mg via INTRAVENOUS

## 2013-05-06 MED ORDER — SODIUM CHLORIDE 0.9 % IV SOLN
10.0000 [IU]/m2 | Freq: Once | INTRAVENOUS | Status: AC
  Administered 2013-05-06: 22 [IU] via INTRAVENOUS
  Filled 2013-05-06: qty 7.33

## 2013-05-06 MED ORDER — ONDANSETRON 16 MG/50ML IVPB (CHCC)
INTRAVENOUS | Status: AC
  Filled 2013-05-06: qty 16

## 2013-05-06 NOTE — Telephone Encounter (Signed)
MD out of the office today...advised by desk nurse to move laterally to Lisa...done °

## 2013-05-06 NOTE — Patient Instructions (Signed)
[facility] Discharge Instructions for Patients Receiving Chemotherapy  Today you received the following chemotherapy agents; doxorubicin, Velban, Bleocin, DTIC.  To help prevent nausea and vomiting after your treatment, we encourage you to take your nausea medication.   If you develop nausea and vomiting that is not controlled by your nausea medication, call the clinic.   BELOW ARE SYMPTOMS THAT SHOULD BE REPORTED IMMEDIATELY:  *FEVER GREATER THAN 100.5 F  *CHILLS WITH OR WITHOUT FEVER  NAUSEA AND VOMITING THAT IS NOT CONTROLLED WITH YOUR NAUSEA MEDICATION  *UNUSUAL SHORTNESS OF BREATH  *UNUSUAL BRUISING OR BLEEDING  TENDERNESS IN MOUTH AND THROAT WITH OR WITHOUT PRESENCE OF ULCERS  *URINARY PROBLEMS  *BOWEL PROBLEMS  UNUSUAL RASH Items with * indicate a potential emergency and should be followed up as soon as possible.  Feel free to call the clinic you have any questions or concerns. The clinic phone number is [phone].

## 2013-05-06 NOTE — Progress Notes (Signed)
  [facility] OFFICE PROGRESS NOTE   Diagnosis:  Hodgkin's lymphoma.  INTERVAL HISTORY:   Mr. [name] [name] returns as scheduled. He completed cycle 3 day 15 ABVD on 04/22/2013. He denies nausea/vomiting. No mouth sores. No diarrhea. He notes mild constipation a few days following each chemotherapy. He takes a laxative as needed. No cough or shortness of breath. No skin rash. He denies numbness or tingling in his hands or feet.  Objective:  Vital signs in last 24 hours:  Blood pressure 116/83, pulse 92, temperature 97.2 F (36.2 C), resp. rate 20, height 5\' 10"  (1.778 m), weight 220 lb 14.4 oz (100.2 kg).    HEENT: No thrush or ulcerations. Lymphatics: Question small lymph node left supraclavicular region in the area of a scar. No palpable cervical, right supraclavicular lymph nodes. Patient unable to comply with axillary examination due to discomfort. Resp: Lungs clear. Cardio: Regular cardiac rhythm. GI: Abdomen soft and nontender. No organomegaly. Vascular: No leg edema. Calves nontender. Neuro: Vibratory sense intact over the fingertips per tuning fork exam.      Lab Results:  Lab Results  Component Value Date   WBC 4.8 05/06/2013   HGB 12.2* 05/06/2013   HCT 34.7* 05/06/2013   MCV 85.9 05/06/2013   PLT 280 05/06/2013   NEUTROABS 1.7 05/06/2013    Imaging:  No results found.  Medications: I have reviewed the patient's current medications.  Assessment/Plan: 1. Hodgkin's disease currently on active treatment with ABVD. Restaging PET scan after cycle 2 showed significant improvement in thoracic, retroperitoneal and pelvic lymphadenopathy. Status post cycle 3 day 15 04/22/2013. 2. 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 3. Heterogeneous thyroid with a 9 mm isthmus lesion. 4. Diastolic dysfunction, grade 1. Followed by Dr. Aundra Dubin. Repeat echo after cycle 4. 5. Family history of cancer. He has seen the  genetics counselor. 6. Type 2 diabetes. 7. Hypertension. 8. CAD. 9. Hypothyroidism. 10. Obesity.   Disposition: He appears stable. Plan to proceed with cycle 4 day 1 ABVD today as scheduled. He will return for cycle 4 day 15 in 2 weeks. He will return for a followup visit with Dr. Juliann Mule in 4 weeks prior to proceeding with cycle 5. He will contact the office in the interim with any problems.  Plan reviewed with Dr. Benay Spice.    Owens Shark ANP/GNP-BC   05/06/2013  1:32 PM

## 2013-05-06 NOTE — Telephone Encounter (Signed)
gave pt appt for lab,MD and 2 D echo for May 2015, eamiled Sharyn Lull regarding chemo

## 2013-05-09 ENCOUNTER — Telehealth: Payer: Self-pay | Admitting: *Deleted

## 2013-05-09 NOTE — Telephone Encounter (Signed)
Per staff message and POF I have scheduled appts.  JMW  

## 2013-05-10 ENCOUNTER — Other Ambulatory Visit (HOSPITAL_COMMUNITY): Payer: PRIVATE HEALTH INSURANCE

## 2013-05-11 ENCOUNTER — Telehealth: Payer: Self-pay | Admitting: Internal Medicine

## 2013-05-11 NOTE — Telephone Encounter (Signed)
Mailed appt for lab,md and chemo

## 2013-05-11 NOTE — Telephone Encounter (Signed)
called pt left message regarding lab,md and chemo for May 2015

## 2013-05-20 ENCOUNTER — Ambulatory Visit (HOSPITAL_BASED_OUTPATIENT_CLINIC_OR_DEPARTMENT_OTHER): Payer: PRIVATE HEALTH INSURANCE

## 2013-05-20 ENCOUNTER — Other Ambulatory Visit: Payer: Self-pay | Admitting: Internal Medicine

## 2013-05-20 ENCOUNTER — Other Ambulatory Visit (HOSPITAL_BASED_OUTPATIENT_CLINIC_OR_DEPARTMENT_OTHER): Payer: PRIVATE HEALTH INSURANCE

## 2013-05-20 VITALS — BP 127/86 | HR 100 | Temp 98.6°F | Resp 18

## 2013-05-20 DIAGNOSIS — C8120 Mixed cellularity classical Hodgkin lymphoma, unspecified site: Secondary | ICD-10-CM

## 2013-05-20 DIAGNOSIS — Z5111 Encounter for antineoplastic chemotherapy: Secondary | ICD-10-CM

## 2013-05-20 DIAGNOSIS — C8128 Mixed cellularity classical Hodgkin lymphoma, lymph nodes of multiple sites: Secondary | ICD-10-CM

## 2013-05-20 LAB — CBC WITH DIFFERENTIAL/PLATELET
BASO%: 1.5 % (ref 0.0–2.0)
BASOS ABS: 0.1 10*3/uL (ref 0.0–0.1)
EOS%: 2.7 % (ref 0.0–7.0)
Eosinophils Absolute: 0.2 10*3/uL (ref 0.0–0.5)
HCT: 36 % — ABNORMAL LOW (ref 38.4–49.9)
HEMOGLOBIN: 12.2 g/dL — AB (ref 13.0–17.1)
LYMPH#: 2.8 10*3/uL (ref 0.9–3.3)
LYMPH%: 45.3 % (ref 14.0–49.0)
MCH: 30.2 pg (ref 27.2–33.4)
MCHC: 34 g/dL (ref 32.0–36.0)
MCV: 88.9 fL (ref 79.3–98.0)
MONO#: 1 10*3/uL — AB (ref 0.1–0.9)
MONO%: 16.5 % — ABNORMAL HIGH (ref 0.0–14.0)
NEUT#: 2.1 10*3/uL (ref 1.5–6.5)
NEUT%: 34 % — ABNORMAL LOW (ref 39.0–75.0)
Platelets: 390 10*3/uL (ref 140–400)
RBC: 4.04 10*6/uL — ABNORMAL LOW (ref 4.20–5.82)
RDW: 15.3 % — AB (ref 11.0–14.6)
WBC: 6.2 10*3/uL (ref 4.0–10.3)

## 2013-05-20 LAB — COMPREHENSIVE METABOLIC PANEL (CC13)
ALBUMIN: 3.7 g/dL (ref 3.5–5.0)
ALT: 40 U/L (ref 0–55)
AST: 37 U/L — AB (ref 5–34)
Alkaline Phosphatase: 64 U/L (ref 40–150)
Anion Gap: 10 mEq/L (ref 3–11)
BUN: 10.7 mg/dL (ref 7.0–26.0)
CHLORIDE: 106 meq/L (ref 98–109)
CO2: 24 meq/L (ref 22–29)
Calcium: 10.2 mg/dL (ref 8.4–10.4)
Creatinine: 1.1 mg/dL (ref 0.7–1.3)
Glucose: 133 mg/dl (ref 70–140)
POTASSIUM: 4.5 meq/L (ref 3.5–5.1)
Sodium: 140 mEq/L (ref 136–145)
Total Bilirubin: 0.4 mg/dL (ref 0.20–1.20)
Total Protein: 7.6 g/dL (ref 6.4–8.3)

## 2013-05-20 LAB — LACTATE DEHYDROGENASE (CC13): LDH: 214 U/L (ref 125–245)

## 2013-05-20 MED ORDER — SODIUM CHLORIDE 0.9 % IV SOLN
Freq: Once | INTRAVENOUS | Status: AC
  Administered 2013-05-20: 14:00:00 via INTRAVENOUS

## 2013-05-20 MED ORDER — ONDANSETRON 16 MG/50ML IVPB (CHCC)
16.0000 mg | Freq: Once | INTRAVENOUS | Status: AC
  Administered 2013-05-20: 16 mg via INTRAVENOUS

## 2013-05-20 MED ORDER — ONDANSETRON 16 MG/50ML IVPB (CHCC)
INTRAVENOUS | Status: AC
  Filled 2013-05-20: qty 16

## 2013-05-20 MED ORDER — DEXAMETHASONE SODIUM PHOSPHATE 20 MG/5ML IJ SOLN
20.0000 mg | Freq: Once | INTRAMUSCULAR | Status: AC
  Administered 2013-05-20: 20 mg via INTRAVENOUS

## 2013-05-20 MED ORDER — SODIUM CHLORIDE 0.9 % IV SOLN
6.0000 mg/m2 | Freq: Once | INTRAVENOUS | Status: AC
  Administered 2013-05-20: 13 mg via INTRAVENOUS
  Filled 2013-05-20: qty 13

## 2013-05-20 MED ORDER — DEXAMETHASONE SODIUM PHOSPHATE 20 MG/5ML IJ SOLN
INTRAMUSCULAR | Status: AC
  Filled 2013-05-20: qty 5

## 2013-05-20 MED ORDER — SODIUM CHLORIDE 0.9 % IV SOLN
10.0000 [IU]/m2 | Freq: Once | INTRAVENOUS | Status: AC
  Administered 2013-05-20: 22 [IU] via INTRAVENOUS
  Filled 2013-05-20: qty 7.33

## 2013-05-20 MED ORDER — SODIUM CHLORIDE 0.9 % IV SOLN
375.0000 mg/m2 | Freq: Once | INTRAVENOUS | Status: AC
  Administered 2013-05-20: 800 mg via INTRAVENOUS
  Filled 2013-05-20: qty 40

## 2013-05-20 MED ORDER — DOXORUBICIN HCL CHEMO IV INJECTION 2 MG/ML
25.0000 mg/m2 | Freq: Once | INTRAVENOUS | Status: AC
  Administered 2013-05-20: 54 mg via INTRAVENOUS
  Filled 2013-05-20: qty 27

## 2013-05-20 MED ORDER — SODIUM CHLORIDE 0.9 % IJ SOLN
10.0000 mL | INTRAMUSCULAR | Status: DC | PRN
  Administered 2013-05-20: 10 mL
  Filled 2013-05-20: qty 10

## 2013-05-20 MED ORDER — HEPARIN SOD (PORK) LOCK FLUSH 100 UNIT/ML IV SOLN
500.0000 [IU] | Freq: Once | INTRAVENOUS | Status: AC | PRN
  Administered 2013-05-20: 500 [IU]
  Filled 2013-05-20: qty 5

## 2013-05-20 NOTE — Progress Notes (Signed)
Per Dr. Juliann Mule, it's OK to treat with today's CMET results.

## 2013-05-20 NOTE — Progress Notes (Signed)
[facility] blood return noted before, during and after Adriamycin. 

## 2013-05-20 NOTE — Patient Instructions (Signed)
[facility] Discharge Instructions for Patients Receiving Chemotherapy  Today you received the following chemotherapy agents Doxorubicin, Vinblastine, Bleocin and Dacarbazine.    To help prevent nausea and vomiting after your treatment, we encourage you to take your nausea medication as prescribed.   If you develop nausea and vomiting that is not controlled by your nausea medication, call the clinic.   BELOW ARE SYMPTOMS THAT SHOULD BE REPORTED IMMEDIATELY:  *FEVER GREATER THAN 100.5 F  *CHILLS WITH OR WITHOUT FEVER  NAUSEA AND VOMITING THAT IS NOT CONTROLLED WITH YOUR NAUSEA MEDICATION  *UNUSUAL SHORTNESS OF BREATH  *UNUSUAL BRUISING OR BLEEDING  TENDERNESS IN MOUTH AND THROAT WITH OR WITHOUT PRESENCE OF ULCERS  *URINARY PROBLEMS  *BOWEL PROBLEMS  UNUSUAL RASH Items with * indicate a potential emergency and should be followed up as soon as possible.  Feel free to call the clinic you have any questions or concerns. The clinic phone number is [phone].

## 2013-05-23 ENCOUNTER — Ambulatory Visit (HOSPITAL_COMMUNITY): Payer: PRIVATE HEALTH INSURANCE | Attending: Nurse Practitioner

## 2013-06-03 ENCOUNTER — Telehealth: Payer: Self-pay | Admitting: Internal Medicine

## 2013-06-03 ENCOUNTER — Encounter: Payer: Self-pay | Admitting: Internal Medicine

## 2013-06-03 ENCOUNTER — Ambulatory Visit (HOSPITAL_BASED_OUTPATIENT_CLINIC_OR_DEPARTMENT_OTHER): Payer: PRIVATE HEALTH INSURANCE | Admitting: Internal Medicine

## 2013-06-03 ENCOUNTER — Other Ambulatory Visit (HOSPITAL_BASED_OUTPATIENT_CLINIC_OR_DEPARTMENT_OTHER): Payer: PRIVATE HEALTH INSURANCE

## 2013-06-03 ENCOUNTER — Ambulatory Visit (HOSPITAL_BASED_OUTPATIENT_CLINIC_OR_DEPARTMENT_OTHER): Payer: PRIVATE HEALTH INSURANCE

## 2013-06-03 VITALS — BP 124/83 | HR 99 | Temp 98.9°F | Resp 20 | Ht 70.0 in | Wt 216.8 lb

## 2013-06-03 DIAGNOSIS — I10 Essential (primary) hypertension: Secondary | ICD-10-CM

## 2013-06-03 DIAGNOSIS — I519 Heart disease, unspecified: Secondary | ICD-10-CM

## 2013-06-03 DIAGNOSIS — I2510 Atherosclerotic heart disease of native coronary artery without angina pectoris: Secondary | ICD-10-CM

## 2013-06-03 DIAGNOSIS — E079 Disorder of thyroid, unspecified: Secondary | ICD-10-CM

## 2013-06-03 DIAGNOSIS — E119 Type 2 diabetes mellitus without complications: Secondary | ICD-10-CM

## 2013-06-03 DIAGNOSIS — C8120 Mixed cellularity classical Hodgkin lymphoma, unspecified site: Secondary | ICD-10-CM

## 2013-06-03 DIAGNOSIS — I728 Aneurysm of other specified arteries: Secondary | ICD-10-CM

## 2013-06-03 DIAGNOSIS — C8128 Mixed cellularity classical Hodgkin lymphoma, lymph nodes of multiple sites: Secondary | ICD-10-CM

## 2013-06-03 DIAGNOSIS — E669 Obesity, unspecified: Secondary | ICD-10-CM

## 2013-06-03 DIAGNOSIS — I5189 Other ill-defined heart diseases: Secondary | ICD-10-CM

## 2013-06-03 DIAGNOSIS — Z5111 Encounter for antineoplastic chemotherapy: Secondary | ICD-10-CM

## 2013-06-03 DIAGNOSIS — Z809 Family history of malignant neoplasm, unspecified: Secondary | ICD-10-CM

## 2013-06-03 LAB — COMPREHENSIVE METABOLIC PANEL (CC13)
ALK PHOS: 54 U/L (ref 40–150)
ALT: 39 U/L (ref 0–55)
ANION GAP: 12 meq/L — AB (ref 3–11)
AST: 28 U/L (ref 5–34)
Albumin: 3.6 g/dL (ref 3.5–5.0)
BILIRUBIN TOTAL: 0.42 mg/dL (ref 0.20–1.20)
BUN: 12.1 mg/dL (ref 7.0–26.0)
CO2: 22 mEq/L (ref 22–29)
Calcium: 9.8 mg/dL (ref 8.4–10.4)
Chloride: 104 mEq/L (ref 98–109)
Creatinine: 1 mg/dL (ref 0.7–1.3)
Glucose: 136 mg/dl (ref 70–140)
Potassium: 3.9 mEq/L (ref 3.5–5.1)
Sodium: 138 mEq/L (ref 136–145)
Total Protein: 7.4 g/dL (ref 6.4–8.3)

## 2013-06-03 LAB — CBC WITH DIFFERENTIAL/PLATELET
BASO%: 0.2 % (ref 0.0–2.0)
Basophils Absolute: 0 10*3/uL (ref 0.0–0.1)
EOS%: 2.6 % (ref 0.0–7.0)
Eosinophils Absolute: 0.1 10*3/uL (ref 0.0–0.5)
HCT: 35.1 % — ABNORMAL LOW (ref 38.4–49.9)
HGB: 12.2 g/dL — ABNORMAL LOW (ref 13.0–17.1)
LYMPH%: 50.3 % — ABNORMAL HIGH (ref 14.0–49.0)
MCH: 30.8 pg (ref 27.2–33.4)
MCHC: 34.6 g/dL (ref 32.0–36.0)
MCV: 88.8 fL (ref 79.3–98.0)
MONO#: 0.8 10*3/uL (ref 0.1–0.9)
MONO%: 15.1 % — ABNORMAL HIGH (ref 0.0–14.0)
NEUT#: 1.7 10*3/uL (ref 1.5–6.5)
NEUT%: 31.8 % — AB (ref 39.0–75.0)
PLATELETS: 382 10*3/uL (ref 140–400)
RBC: 3.96 10*6/uL — ABNORMAL LOW (ref 4.20–5.82)
RDW: 15.2 % — ABNORMAL HIGH (ref 11.0–14.6)
WBC: 5.4 10*3/uL (ref 4.0–10.3)
lymph#: 2.7 10*3/uL (ref 0.9–3.3)

## 2013-06-03 LAB — LACTATE DEHYDROGENASE (CC13): LDH: 214 U/L (ref 125–245)

## 2013-06-03 MED ORDER — ONDANSETRON 16 MG/50ML IVPB (CHCC)
INTRAVENOUS | Status: AC
  Filled 2013-06-03: qty 16

## 2013-06-03 MED ORDER — VINBLASTINE SULFATE CHEMO INJECTION 1 MG/ML
6.0000 mg/m2 | Freq: Once | INTRAVENOUS | Status: AC
  Administered 2013-06-03: 13 mg via INTRAVENOUS
  Filled 2013-06-03: qty 13

## 2013-06-03 MED ORDER — SODIUM CHLORIDE 0.9 % IV SOLN
375.0000 mg/m2 | Freq: Once | INTRAVENOUS | Status: AC
  Administered 2013-06-03: 800 mg via INTRAVENOUS
  Filled 2013-06-03: qty 40

## 2013-06-03 MED ORDER — HEPARIN SOD (PORK) LOCK FLUSH 100 UNIT/ML IV SOLN
500.0000 [IU] | Freq: Once | INTRAVENOUS | Status: AC | PRN
  Administered 2013-06-03: 500 [IU]
  Filled 2013-06-03: qty 5

## 2013-06-03 MED ORDER — DEXAMETHASONE SODIUM PHOSPHATE 20 MG/5ML IJ SOLN
INTRAMUSCULAR | Status: AC
  Filled 2013-06-03: qty 5

## 2013-06-03 MED ORDER — ONDANSETRON 16 MG/50ML IVPB (CHCC)
16.0000 mg | Freq: Once | INTRAVENOUS | Status: AC
  Administered 2013-06-03: 16 mg via INTRAVENOUS

## 2013-06-03 MED ORDER — DEXAMETHASONE SODIUM PHOSPHATE 20 MG/5ML IJ SOLN
20.0000 mg | Freq: Once | INTRAMUSCULAR | Status: AC
  Administered 2013-06-03: 20 mg via INTRAVENOUS

## 2013-06-03 MED ORDER — SODIUM CHLORIDE 0.9 % IV SOLN
Freq: Once | INTRAVENOUS | Status: AC
  Administered 2013-06-03: 15:00:00 via INTRAVENOUS

## 2013-06-03 MED ORDER — SODIUM CHLORIDE 0.9 % IV SOLN
10.0000 [IU]/m2 | Freq: Once | INTRAVENOUS | Status: AC
  Administered 2013-06-03: 22 [IU] via INTRAVENOUS
  Filled 2013-06-03: qty 7.33

## 2013-06-03 MED ORDER — SODIUM CHLORIDE 0.9 % IJ SOLN
10.0000 mL | INTRAMUSCULAR | Status: DC | PRN
  Administered 2013-06-03: 10 mL
  Filled 2013-06-03: qty 10

## 2013-06-03 MED ORDER — DOXORUBICIN HCL CHEMO IV INJECTION 2 MG/ML
25.0000 mg/m2 | Freq: Once | INTRAVENOUS | Status: AC
  Administered 2013-06-03: 54 mg via INTRAVENOUS
  Filled 2013-06-03: qty 27

## 2013-06-03 NOTE — Patient Instructions (Signed)
[facility] Discharge Instructions for Patients Receiving Chemotherapy  Today you received the following chemotherapy agents adriamycin/bleomycin/vinblastine/dacarbazine.    To help prevent nausea and vomiting after your treatment, we encourage you to take your nausea medication as directed   If you develop nausea and vomiting that is not controlled by your nausea medication, call the clinic.   BELOW ARE SYMPTOMS THAT SHOULD BE REPORTED IMMEDIATELY:  *FEVER GREATER THAN 100.5 F  *CHILLS WITH OR WITHOUT FEVER  NAUSEA AND VOMITING THAT IS NOT CONTROLLED WITH YOUR NAUSEA MEDICATION  *UNUSUAL SHORTNESS OF BREATH  *UNUSUAL BRUISING OR BLEEDING  TENDERNESS IN MOUTH AND THROAT WITH OR WITHOUT PRESENCE OF ULCERS  *URINARY PROBLEMS  *BOWEL PROBLEMS  UNUSUAL RASH Items with * indicate a potential emergency and should be followed up as soon as possible.  Feel free to call the clinic you have any questions or concerns. The clinic phone number is [phone].

## 2013-06-03 NOTE — Progress Notes (Signed)
[facility] OFFICE PROGRESS NOTE  Pcp Not In System No address on file  DIAGNOSIS: Hodgkin's disease with mixed cellularity - Plan: CBC with Differential, Basic metabolic panel (Bmet) - [facility], CBC with Differential, Comprehensive metabolic panel (Cmet) - [facility], Lactate dehydrogenase (LDH) - [facility]  Diastolic dysfunction - Plan: 2D Echocardiogram without contrast  Chief Complaint  Patient presents with  . Hodgkin's disease with mixed cellularity    CURRENT TREATMENT: ABVD q 2 weeks started on 02/11/2013. He is scheduled for cycle #5, day #1 on 06/03/2013.     Hodgkin's disease with mixed cellularity   12/03/2012 Imaging CT Neck: Lymphadenopathy left cervical chain with right-sided axillary LAD.  Heterogenous thyroid with an 9 mm isthmus lesion.     12/29/2012 Imaging CXR: No bulky LAD. No active cardiopulmonary disease.    12/31/2012 Pathology Classical Hodgkin's disease of mixed cellularity.    12/31/2012 Procedure Excisional biopsy of left cervical/supraclavicular LAD.    01/14/2013 Initial Diagnosis Hodgkin's disease with mixed cellularity, Stage IIIB. (night sweats).  ESR greater than 50. LDH of 297.  IPS, 3 points for male, age greater than 58, albumin less than 4; 60% 5 year FFP, 78% 5-year OS Haseelever D, [id number].    01/18/2013 Imaging Echo: LV EF 50-55%.  Abnormal L ventricular relaxation (grade 1 diastolic dysfunction). Referred to cardiology Rowland Lathe) with recommendations to repeat Echo s/p 4 cycles.    01/24/2013 Cancer Staging PET/CT: Hypermetabolic LAD identified w/in the neck, chest, abdomen, and pelvis.  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  CT C/A/P:  2 nonspecific pulmonary nodules LLL, 3 mm and RLL 4 mm.    01/25/2013 Bone Marrow Biopsy Bone marrow without lymphoma involvement.  Normal cytogenetics. Normocellular for age with trileage hematopoiesis without tumor identifed.    02/03/2013 Procedure R port-a-cath placment.    02/04/2013 Procedure  S/p successful complex coil, plug and liquid embolization of left hepatic artery aneurysm by IR (Dr. Karin Golden)   02/11/2013 -  Chemotherapy Started ABVD q 28 days (Adriamycin 25 mg/m2, vinblastine 6 mg/m2, bleomycin 10 units/m2, dacarbazine 275 mg/m2.   based on Duggan DB, JCO, 2003. He is scheduled for cycle #5 on 06/03/2013.   02/11/2013 Family History Referral to genetics.  Jewish acenstry with hodgkin's disease in father; mother died of an abdominal aneurysm.    04/06/2013 Imaging PET/CT: Significantly improved thoracic, retroperitoneal, and pelvic lymphadenopathy, as described above, max SUV 2.9. Deauville score 2.        INTERVAL HISTORY: [name] [name] 48 y.o. male with a history of classical hodgkin's disease of mixed cellularity, stage III b is here for one month follow-up.  He was last seen by Elby Showers. Marcello Moores, NP on 05/06/2013. He continues to deny fevers or chills or further night sweats or weight loss.  He denies any breathing problems.  He reports good energy and appetite.    MEDICAL HISTORY: Past Medical History  Diagnosis Date  . Hypertension   . Diabetes mellitus without complication     Pre-diabetes  . Depression   . Headache(784.0)   . Thyroid disease     Hx: of  . Cancer 2015    cervical lymphadenopathy  . Hodgkin lymphoma 2015  . Hepatic artery aneurysm 2015    INTERIM HISTORY: has Cervical lymphadenopathy; Hodgkin's disease with mixed cellularity; DM (diabetes mellitus); Hyperlipemia; Obesity; Hypothyroidism; Aneurysm artery, hepatic; Family history of cancer; Diastolic dysfunction; Thyroid lesion; Essential hypertension; At risk for coronary artery disease; and Chemotherapy-induced cardiomyopathy on his problem  list.    ALLERGIES:  has No Known Allergies.  MEDICATIONS: has a current medication list which includes the following prescription(s): azelastine-fluticasone, carvedilol, dexamethasone, eszopiclone, fenofibrate, fexofenadine, levothyroxine,  lidocaine-prilocaine, lisinopril, lorazepam, lovastatin, metformin, montelukast, naproxen sodium, ondansetron, oxycodone, potassium chloride, prochlorperazine, sertraline, and vitamin d (ergocalciferol).  SURGICAL HISTORY:  Past Surgical History  Procedure Laterality Date  . Tonsillectomy    . Mass excision Left 12/31/2012    Procedure: CERVICAL LYMPH NODE BIOPSY;  Surgeon: [name], MD;  Location: [facility];  Service: General;  Laterality: Left;    REVIEW OF SYSTEMS:   Constitutional: Denies fevers, chills or abnormal weight loss Eyes: Denies blurriness of vision Ears, nose, mouth, throat, and face: Denies mucositis or sore throat Respiratory: Denies cough, dyspnea or wheezes Cardiovascular: Denies palpitation, chest discomfort or lower extremity swelling Gastrointestinal:  Denies nausea, heartburn or change in bowel habits Skin: Denies abnormal skin rashes Lymphatics: Denies new lymphadenopathy or easy bruising Neurological:Denies numbness, tingling or new weaknesses Behavioral/Psych: Mood is stable, no new changes  All other systems were reviewed with the patient and are negative.  PHYSICAL EXAMINATION: ECOG PERFORMANCE STATUS: 0 - Asymptomatic  Blood pressure 124/83, pulse 99, temperature 98.9 F (37.2 C), temperature source Oral, resp. rate 20, height _0  (1.778 m), weight 216 lb 12.8 oz (98.34 kg).  GENERAL:alert, no distress and comfortable; well developed and well nourished.  SKIN: skin color, texture, turgor are normal, no rashes or significant lesions; R portacath. EYES: normal, Conjunctiva are pink and non-injected, sclera clear OROPHARYNX:no exudate, no erythema and lips, buccal mucosa, and tongue normal  NECK: supple, thyroid normal size, non-tender, without nodularity LYMPH:  no palpable lymphadenopathy in the cervical; small less a cm adenopathy in the R supraclavicular area. Decreased adenopathy (less than 0.5 cm) in the L axillary. LUNGS: clear to  auscultation with normal breathing effort, no wheezes or rhonchi HEART: regular rate & rhythm and no murmurs and no lower extremity edema ABDOMEN:abdomen soft, non-tender and normal bowel sounds Musculoskeletal:no cyanosis of digits and no clubbing  NEURO: alert & oriented x 3 with fluent speech, no focal motor/sensory deficits  Labs:  Lab Results  Component Value Date   WBC 5.4 06/03/2013   HGB 12.2* 06/03/2013   HCT 35.1* 06/03/2013   MCV 88.8 06/03/2013   PLT 382 06/03/2013   NEUTROABS 1.7 06/03/2013      Chemistry      Component Value Date/Time   NA 138 06/03/2013 1319   NA 137 02/05/2013 0528   K 3.9 06/03/2013 1319   K 4.1 02/05/2013 0528   CL 98 02/05/2013 0528   CO2 22 06/03/2013 1319   CO2 28 02/05/2013 0528   BUN 12.1 06/03/2013 1319   BUN 14 02/05/2013 0528   CREATININE 1.0 06/03/2013 1319   CREATININE 0.99 02/05/2013 0528      Component Value Date/Time   CALCIUM 9.8 06/03/2013 1319   CALCIUM 9.0 02/05/2013 0528   ALKPHOS 54 06/03/2013 1319   ALKPHOS 97 02/05/2013 0528   AST 28 06/03/2013 1319   AST 24 02/05/2013 0528   ALT 39 06/03/2013 1319   ALT 12 02/05/2013 0528   BILITOT 0.42 06/03/2013 1319   BILITOT 0.3 02/05/2013 0528       Basic Metabolic Panel:  Recent Labs Lab 06/03/13 1319  NA 138  K 3.9  CO2 22  GLUCOSE 136  BUN 12.1  CREATININE 1.0  CALCIUM 9.8   GFR Estimated Creatinine Clearance: 107.3 ml/min (by C-G formula based on Cr of  1). Liver Function Tests:  Recent Labs Lab 06/03/13 1319  AST 28  ALT 39  ALKPHOS 54  BILITOT 0.42  PROT 7.4  ALBUMIN 3.6   No results found for this basename: LIPASE, AMYLASE,  in the last 168 hours No results found for this basename: AMMONIA,  in the last 168 hours Coagulation profile No results found for this basename: INR, PROTIME,  in the last 168 hours  CBC:  Recent Labs Lab 06/03/13 1319  WBC 5.4  NEUTROABS 1.7  HGB 12.2*  HCT 35.1*  MCV 88.8  PLT 382    Anemia work up No results found for this  basename: VITAMINB12, FOLATE, FERRITIN, TIBC, IRON, RETICCTPCT,  in the last 72 hours  Studies:  No results found.   RADIOGRAPHIC STUDIES: None  ASSESSMENT: [name] [name] 48 y.o. male with a history of Hodgkin's disease with mixed cellularity - Plan: CBC with Differential, Basic metabolic panel (Bmet) - [facility], CBC with Differential, Comprehensive metabolic panel (Cmet) - [facility], Lactate dehydrogenase (LDH) - [facility]  Diastolic dysfunction - Plan: 2D Echocardiogram without contrast   PLAN:  1. Classical Hodgkin's Disease with mixed cellularity, c/w Stage IIIB (if pulmonary nodules which are less than 1cm are negative). Bone marrow is negative for lymphoma. IPS for Hodgkin's disease (3 points for male, age greater than 82, albumin less than 4; 60% 5 year FFP, 78% 5-year overall survival based on Haseelever D, NEJM, 1998).   --We will continue chemotherapy with ABVD x 6 cycles (category 2A) q28 days (doxorubicin, bleomycin, vinblastine, and dacarbazine) (Duggan DB, JCO, 2003). We had a detailed discussion regarding the benefits of including approximately 80 percent of patients with advanced stage HL will attain a complete response after treatment with ABVD. Up to one-quarter of patients will have disease progression requiring further therapy; half of those will have long term survival with autologous hematopoietic cell transplantation. Overall survival rates at 4, 7, and 10 years are approximately 90, 75, and 55 percent, respectively.  --We will continue Day #1 chemotherapy as follows):   Day 1, Cycle 5A 06/03/2013  Doxorubicin (Adriamycin) 25 mg/m2  Vinblastine 6 mg/m2  Bleomycin 10 units/m2  Dacarbazine 375 mg/m2   -He will have Day15, Cycle 5B on 06/05. We obtained an restaging PET-CT following Cycle #2 (NCCN Guidelines, Version 2.2014, HODG-9) to assess response and further treatment based on deaville criteria for response. Deaville criteria was 2 so therefore we will continue with this ABVD  therapy.  2. Left Hepatic artery aneurysm S/p embolization by IR on 02-04-2013.  --He had follow-up with Dr. Geroge Baseman s/p coil embolization on 02/04/2013.  His MRI of brain and CTA was as noted above.  He will continue annual follow up with Dr. Geroge Baseman. His liver enzymes remain normal limits. He was counseled to report any symptoms of abdominal pain and avoid any sports or high energy exercises. He voiced understanding and agreement with this plan.   3. Heterogeneous thyroid with a in 9 mm isthmus lesion.  --Consider further evaluation with thyroid ultrasound. If patient is clinically hyperthyroid, consider nuclear medicine thyroid uptake and scan. We will repeat his thyroid studies. TSH was 9.659 on 0/93/2671.   4. Diastolic Dysfunction, Grade I.  --Echocardiogram as noted above. Patient has a history of DM2, Hypertension. Repeat Echocardiogram following cycle #4.  Ordered today for 06/10/2013. We will forward to his cardiologists.   5. Family History of cancer, cancer prior to age of 24.  --Patient has Jewish ancestry and reports father was also diagnosed  with hodgkin's disease. He has an appointment with Genetics for consultation.   6. DM2.  --Continue Metformin 500 mg daily per PCP/endo.   7. Hypertension/HLD/CAD.  --Continue lisinopril 10 mg daily.  --Continue hydrochlorothiazide 50 mg daily with KCL 10 mEQ daily.  --Continue Lovastatin 500 mg daily.   8. Hypothyroidism  --Continue levothyroxine 200 mg daily per endo. Checked TSH, T4. TSH slightly high. Thyroid ultrasound as needed. (see #4)   9. Obesity  --Continue dieting and weight lost.   10. Follow-up.  --Patient will require follow-up in 4 weeks for consideration of cycle #6 of ABVD. He will check labs biweekly with chemotherapy.  All questions were answered. The patient knows to call the clinic with any problems, questions or concerns. We can certainly see the patient much sooner if necessary.  I spent 15 minutes  counseling the patient face to face. The total time spent in the appointment was 25 minutes.    Concha Norway, MD 06/03/2013 3:07 PM

## 2013-06-03 NOTE — Telephone Encounter (Signed)
gv adn printed appts sched adn avs for pt for June....vasculat lab to call back with echo appt..their pc not working

## 2013-06-14 ENCOUNTER — Telehealth: Payer: Self-pay | Admitting: Internal Medicine

## 2013-06-14 NOTE — Telephone Encounter (Signed)
s.w pt and advised on echo...pt ok and awar...per Vaughan Basta no pre-cert needed

## 2013-06-15 ENCOUNTER — Ambulatory Visit (HOSPITAL_COMMUNITY)
Admission: RE | Admit: 2013-06-15 | Discharge: 2013-06-15 | Disposition: A | Discharge status: Home / Self Care | Payer: PRIVATE HEALTH INSURANCE | Source: Ambulatory Visit | Attending: Internal Medicine | Admitting: Internal Medicine

## 2013-06-15 DIAGNOSIS — Z8571 Personal history of Hodgkin lymphoma: Secondary | ICD-10-CM | POA: Insufficient documentation

## 2013-06-15 DIAGNOSIS — T451X5A Adverse effect of antineoplastic and immunosuppressive drugs, initial encounter: Secondary | ICD-10-CM | POA: Insufficient documentation

## 2013-06-15 DIAGNOSIS — I519 Heart disease, unspecified: Secondary | ICD-10-CM | POA: Insufficient documentation

## 2013-06-15 DIAGNOSIS — I10 Essential (primary) hypertension: Secondary | ICD-10-CM | POA: Insufficient documentation

## 2013-06-15 DIAGNOSIS — E119 Type 2 diabetes mellitus without complications: Secondary | ICD-10-CM | POA: Insufficient documentation

## 2013-06-15 DIAGNOSIS — I5189 Other ill-defined heart diseases: Secondary | ICD-10-CM

## 2013-06-15 DIAGNOSIS — E785 Hyperlipidemia, unspecified: Secondary | ICD-10-CM | POA: Insufficient documentation

## 2013-06-15 DIAGNOSIS — I428 Other cardiomyopathies: Secondary | ICD-10-CM | POA: Insufficient documentation

## 2013-06-15 NOTE — Progress Notes (Signed)
  Echocardiogram 2D Echocardiogram has been performed.  [name] [name] 06/15/2013, 11:42 AM

## 2013-06-17 ENCOUNTER — Ambulatory Visit (HOSPITAL_BASED_OUTPATIENT_CLINIC_OR_DEPARTMENT_OTHER): Payer: PRIVATE HEALTH INSURANCE

## 2013-06-17 ENCOUNTER — Other Ambulatory Visit (HOSPITAL_BASED_OUTPATIENT_CLINIC_OR_DEPARTMENT_OTHER): Payer: PRIVATE HEALTH INSURANCE

## 2013-06-17 ENCOUNTER — Encounter: Payer: Self-pay | Admitting: Internal Medicine

## 2013-06-17 ENCOUNTER — Other Ambulatory Visit: Payer: Self-pay | Admitting: Internal Medicine

## 2013-06-17 VITALS — BP 126/84 | HR 77 | Temp 98.0°F | Resp 18

## 2013-06-17 DIAGNOSIS — C8120 Mixed cellularity classical Hodgkin lymphoma, unspecified site: Secondary | ICD-10-CM

## 2013-06-17 DIAGNOSIS — C8128 Mixed cellularity classical Hodgkin lymphoma, lymph nodes of multiple sites: Secondary | ICD-10-CM

## 2013-06-17 DIAGNOSIS — Z5111 Encounter for antineoplastic chemotherapy: Secondary | ICD-10-CM

## 2013-06-17 LAB — CBC WITH DIFFERENTIAL/PLATELET
BASO%: 1.4 % (ref 0.0–2.0)
Basophils Absolute: 0.1 10*3/uL (ref 0.0–0.1)
EOS%: 3.7 % (ref 0.0–7.0)
Eosinophils Absolute: 0.2 10*3/uL (ref 0.0–0.5)
HCT: 34.8 % — ABNORMAL LOW (ref 38.4–49.9)
HGB: 11.9 g/dL — ABNORMAL LOW (ref 13.0–17.1)
LYMPH%: 33.2 % (ref 14.0–49.0)
MCH: 30.7 pg (ref 27.2–33.4)
MCHC: 34.2 g/dL (ref 32.0–36.0)
MCV: 89.8 fL (ref 79.3–98.0)
MONO#: 0.9 10*3/uL (ref 0.1–0.9)
MONO%: 14.7 % — ABNORMAL HIGH (ref 0.0–14.0)
NEUT#: 3 10*3/uL (ref 1.5–6.5)
NEUT%: 47 % (ref 39.0–75.0)
Platelets: 361 10*3/uL (ref 140–400)
RBC: 3.88 10*6/uL — ABNORMAL LOW (ref 4.20–5.82)
RDW: 15.4 % — ABNORMAL HIGH (ref 11.0–14.6)
WBC: 6.3 10*3/uL (ref 4.0–10.3)
lymph#: 2.1 10*3/uL (ref 0.9–3.3)

## 2013-06-17 LAB — BASIC METABOLIC PANEL (CC13)
ANION GAP: 12 meq/L — AB (ref 3–11)
BUN: 15.5 mg/dL (ref 7.0–26.0)
CO2: 20 mEq/L — ABNORMAL LOW (ref 22–29)
Calcium: 9.5 mg/dL (ref 8.4–10.4)
Chloride: 104 mEq/L (ref 98–109)
Creatinine: 1 mg/dL (ref 0.7–1.3)
Glucose: 227 mg/dl — ABNORMAL HIGH (ref 70–140)
POTASSIUM: 4.6 meq/L (ref 3.5–5.1)
Sodium: 135 mEq/L — ABNORMAL LOW (ref 136–145)

## 2013-06-17 MED ORDER — SODIUM CHLORIDE 0.9 % IJ SOLN
10.0000 mL | INTRAMUSCULAR | Status: DC | PRN
  Administered 2013-06-17: 10 mL
  Filled 2013-06-17: qty 10

## 2013-06-17 MED ORDER — SODIUM CHLORIDE 0.9 % IV SOLN
375.0000 mg/m2 | Freq: Once | INTRAVENOUS | Status: AC
  Administered 2013-06-17: 800 mg via INTRAVENOUS
  Filled 2013-06-17: qty 40

## 2013-06-17 MED ORDER — DOXORUBICIN HCL CHEMO IV INJECTION 2 MG/ML
25.0000 mg/m2 | Freq: Once | INTRAVENOUS | Status: AC
  Administered 2013-06-17: 54 mg via INTRAVENOUS
  Filled 2013-06-17: qty 27

## 2013-06-17 MED ORDER — VINBLASTINE SULFATE CHEMO INJECTION 1 MG/ML
6.0000 mg/m2 | Freq: Once | INTRAVENOUS | Status: AC
  Administered 2013-06-17: 13 mg via INTRAVENOUS
  Filled 2013-06-17: qty 13

## 2013-06-17 MED ORDER — HEPARIN SOD (PORK) LOCK FLUSH 100 UNIT/ML IV SOLN
500.0000 [IU] | Freq: Once | INTRAVENOUS | Status: AC | PRN
  Administered 2013-06-17: 500 [IU]
  Filled 2013-06-17: qty 5

## 2013-06-17 MED ORDER — SODIUM CHLORIDE 0.9 % IV SOLN
10.0000 [IU]/m2 | Freq: Once | INTRAVENOUS | Status: AC
  Administered 2013-06-17: 22 [IU] via INTRAVENOUS
  Filled 2013-06-17: qty 7.33

## 2013-06-17 MED ORDER — SODIUM CHLORIDE 0.9 % IV SOLN
Freq: Once | INTRAVENOUS | Status: AC
  Administered 2013-06-17: 12:00:00 via INTRAVENOUS

## 2013-06-17 MED ORDER — DEXAMETHASONE SODIUM PHOSPHATE 20 MG/5ML IJ SOLN
INTRAMUSCULAR | Status: AC
  Filled 2013-06-17: qty 5

## 2013-06-17 MED ORDER — ONDANSETRON 16 MG/50ML IVPB (CHCC)
INTRAVENOUS | Status: AC
  Filled 2013-06-17: qty 16

## 2013-06-17 MED ORDER — DEXAMETHASONE SODIUM PHOSPHATE 20 MG/5ML IJ SOLN
20.0000 mg | Freq: Once | INTRAMUSCULAR | Status: AC
  Administered 2013-06-17: 20 mg via INTRAVENOUS

## 2013-06-17 MED ORDER — ONDANSETRON 16 MG/50ML IVPB (CHCC)
16.0000 mg | Freq: Once | INTRAVENOUS | Status: AC
  Administered 2013-06-17: 16 mg via INTRAVENOUS

## 2013-06-17 NOTE — Patient Instructions (Addendum)
Dactinomycin, Actinomycin D injection What is this medicine? DACTINOMYCIN (dak ti noe MYE sin) is a chemotherapy drug. It is used to treat many kinds of cancer like Wilms' tumor, some sarcomas, and placental and testicular cancers. It is also used to treat other solid tumors. This medicine may be used for other purposes; ask your health care provider or pharmacist if you have questions. COMMON BRAND NAME(S): Cosmegen What should I tell my health care provider before I take this medicine? They need to know if you have any of these conditions: -infection (especially a virus infection such as chickenpox, cold sores, or herpes) -liver disease -low blood counts like low platelets, red blood cells, white blood cells -recent radiation therapy -an unusual or allergic reaction to dactinomycin, other chemotherapy agents, other medicines, foods, dyes, or preservatives -pregnant or trying to get pregnant -breast-feeding How should I use this medicine? This drug is given as an infusion into a vein. It is administered in a hospital or clinic by a specially trained health care professional. Talk to your pediatrician regarding the use of this medicine in children. While this drug may be prescribed for children as young as 14 months of age for selected conditions, precautions do apply. Overdosage: If you think you have taken too much of this medicine contact a poison control center or emergency room at once. NOTE: This medicine is only for you. Do not share this medicine with others. What if I miss a dose? It is important not to miss your dose. Call your doctor or health care professional if you are unable to keep an appointment. What may interact with this medicine? -medicines to increase blood counts like filgrastim, pegfilgrastim, sargramostim -vaccines This list may not describe all possible interactions. Give your health care provider a list of all the medicines, herbs, non-prescription drugs, or dietary  supplements you use. Also tell them if you smoke, drink alcohol, or use illegal drugs. Some items may interact with your medicine. What should I watch for while using this medicine? Your condition will be monitored carefully while you are receiving this medicine. You will need important blood work done while you are taking this medicine. This drug may make you feel generally unwell. This is not uncommon, as chemotherapy can affect healthy cells as well as cancer cells. Report any side effects. Continue your course of treatment even though you feel ill unless your doctor tells you to stop. Call your doctor or health care professional for advice if you get a fever, chills or sore throat, or other symptoms of a cold or flu. Do not treat yourself. This drug decreases your body's ability to fight infections. Try to avoid being around people who are sick. This medicine may increase your risk to bruise or bleed. Call your doctor or health care professional if you notice any unusual bleeding. Be careful brushing and flossing your teeth or using a toothpick because you may get an infection or bleed more easily. If you have any dental work done, tell your dentist you are receiving this medicine. Avoid taking products that contain aspirin, acetaminophen, ibuprofen, naproxen, or ketoprofen unless instructed by your doctor. These medicines may hide a fever. Do not become pregnant while taking this medicine. Women should inform their doctor if they wish to become pregnant or think they might be pregnant. There is a potential for serious side effects to an unborn child. Talk to your health care professional or pharmacist for more information. Do not breast-feed an infant while taking this  medicine. What side effects may I notice from receiving this medicine? Side effects that you should report to your doctor or health care professional as soon as possible: -allergic reactions like skin rash, itching or hives, swelling  of the face, lips, or tongue -low blood counts - this medicine may decrease the number of white blood cells, red blood cells and platelets. You may be at increased risk for infections and bleeding. -signs of infection - fever or chills, cough, sore throat, pain or difficulty passing urine -signs of decreased platelets or bleeding - bruising, pinpoint red spots on the skin, black, tarry stools, blood in the urine -signs of decreased red blood cells - unusually weak or tired, fainting spells, lightheadedness -breathing problems -changes in vision -chest pain -mouth sores -pain, swelling, redness at site where injected -seizures -stomach pain, ulcers -swelling of the ankles, feet, hands, or stomach -trouble passing urine or change in the amount of urine -vomiting -yellowing of the eyes or skin Side effects that usually do not require medical attention (report to your doctor or health care professional if they continue or are bothersome): -acne-like skin rash -darker skin color -diarrhea -hair loss -loss of appetite -nausea This list may not describe all possible side effects. Call your doctor for medical advice about side effects. You may report side effects to FDA at 1-800-FDA-1088. Where should I keep my medicine? This drug is given in a hospital or clinic and will not be stored at home. NOTE: This sheet is a summary. It may not cover all possible information. If you have questions about this medicine, talk to your doctor, pharmacist, or health care provider.  2014, [employer]. (2007-04-20 17:09:59) Bleomycin injection What is this medicine? BLEOMYCIN (blee oh MYE sin) is a chemotherapy drug. It is used to treat many kinds of cancer like lymphoma, cervical cancer, head and neck cancer, and testicular cancer. It is also used to prevent and to treat fluid build-up around the lungs caused by some cancers. This medicine may be used for other purposes; ask your health care  provider or pharmacist if you have questions. COMMON BRAND NAME(S): Blenoxane What should I tell my health care provider before I take this medicine? They need to know if you have any of these conditions: -cigarette smoker -kidney disease -lung disease -recent or ongoing radiation therapy -an unusual or allergic reaction to bleomycin, other chemotherapy agents, other medicines, foods, dyes, or preservatives -pregnant or trying to get pregnant -breast-feeding How should I use this medicine? This drug is given as an infusion into a vein or a body cavity. It can also be given as an injection into a muscle or under the skin. It is administered in a hospital or clinic by a specially trained health care professional. Talk to your pediatrician regarding the use of this medicine in children. Special care may be needed. Overdosage: If you think you have taken too much of this medicine contact a poison control center or emergency room at once. NOTE: This medicine is only for you. Do not share this medicine with others. What if I miss a dose? It is important not to miss your dose. Call your doctor or health care professional if you are unable to keep an appointment. What may interact with this medicine? -certain antibiotics given by injection -cisplatin -cyclosporine -diuretics -foscarnet -medicines to increase blood counts like filgrastim, pegfilgrastim, sargramostim -vaccines This list may not describe all possible interactions. Give your health care provider a list of all the medicines, herbs,  non-prescription drugs, or dietary supplements you use. Also tell them if you smoke, drink alcohol, or use illegal drugs. Some items may interact with your medicine. What should I watch for while using this medicine? Visit your doctor for checks on your progress. This drug may make you feel generally unwell. This is not uncommon, as chemotherapy can affect healthy cells as well as cancer cells. Report any  side effects. Continue your course of treatment even though you feel ill unless your doctor tells you to stop. Call your doctor or health care professional for advice if you get a fever, chills or sore throat, or other symptoms of a cold or flu. Do not treat yourself. This drug decreases your body's ability to fight infections. Try to avoid being around people who are sick. Avoid taking products that contain aspirin, acetaminophen, ibuprofen, naproxen, or ketoprofen unless instructed by your doctor. These medicines may hide a fever. Do not become pregnant while taking this medicine. Women should inform their doctor if they wish to become pregnant or think they might be pregnant. There is a potential for serious side effects to an unborn child. Talk to your health care professional or pharmacist for more information. Do not breast-feed an infant while taking this medicine. There is a maximum amount of this medicine you should receive throughout your life. The amount depends on the medical condition being treated and your overall health. Your doctor will watch how much of this medicine you receive in your lifetime. Tell your doctor if you have taken this medicine before. What side effects may I notice from receiving this medicine? Side effects that you should report to your doctor or health care professional as soon as possible: -allergic reactions like skin rash, itching or hives, swelling of the face, lips, or tongue -breathing problems -chest pain -confusion -cough -fast, irregular heartbeat -feeling faint or lightheaded, falls -fever or chills -mouth sores -pain, tingling, numbness in the hands or feet -trouble passing urine or change in the amount of urine -yellowing of the eyes or skin Side effects that usually do not require medical attention (report to your doctor or health care professional if they continue or are bothersome): -darker skin color -hair loss -irritation at site where  injected -loss of appetite -nail changes -nausea and vomiting -weight loss This list may not describe all possible side effects. Call your doctor for medical advice about side effects. You may report side effects to FDA at 1-800-FDA-1088. Where should I keep my medicine? This drug is given in a hospital or clinic and will not be stored at home. NOTE: This sheet is a summary. It may not cover all possible information. If you have questions about this medicine, talk to your doctor, pharmacist, or health care provider.  2014, [employer]. (2012-04-27 09:36:48) Doxorubicin injection What is this medicine? DOXORUBICIN (dox oh ROO bi sin) is a chemotherapy drug. It is used to treat many kinds of cancer like Hodgkin's disease, leukemia, non-Hodgkin's lymphoma, neuroblastoma, sarcoma, and Wilms' tumor. It is also used to treat bladder cancer, breast cancer, lung cancer, ovarian cancer, stomach cancer, and thyroid cancer. This medicine may be used for other purposes; ask your health care provider or pharmacist if you have questions. COMMON BRAND NAME(S): Adriamycin PFS, Adriamycin RDF, Adriamycin, Rubex What should I tell my health care provider before I take this medicine? They need to know if you have any of these conditions: -blood disorders -heart disease, recent heart attack -infection (especially a virus infection such as  chickenpox, cold sores, or herpes) -irregular heartbeat -liver disease -recent or ongoing radiation therapy -an unusual or allergic reaction to doxorubicin, other chemotherapy agents, other medicines, foods, dyes, or preservatives -pregnant or trying to get pregnant -breast-feeding How should I use this medicine? This drug is given as an infusion into a vein. It is administered in a hospital or clinic by a specially trained health care professional. If you have pain, swelling, burning or any unusual feeling around the site of your injection, tell your health care  professional right away. Talk to your pediatrician regarding the use of this medicine in children. Special care may be needed. Overdosage: If you think you have taken too much of this medicine contact a poison control center or emergency room at once. NOTE: This medicine is only for you. Do not share this medicine with others. What if I miss a dose? It is important not to miss your dose. Call your doctor or health care professional if you are unable to keep an appointment. What may interact with this medicine? Do not take this medicine with any of the following medications: -cisapride -droperidol -halofantrine -pimozide -zidovudine This medicine may also interact with the following medications: -chloroquine -chlorpromazine -clarithromycin -cyclophosphamide -cyclosporine -erythromycin -medicines for depression, anxiety, or psychotic disturbances -medicines for irregular heart beat like amiodarone, bepridil, dofetilide, encainide, flecainide, propafenone, quinidine -medicines for seizures like ethotoin, fosphenytoin, phenytoin -medicines for nausea, vomiting like dolasetron, ondansetron, palonosetron -medicines to increase blood counts like filgrastim, pegfilgrastim, sargramostim -methadone -methotrexate -pentamidine -progesterone -vaccines -verapamil Talk to your doctor or health care professional before taking any of these medicines: -acetaminophen -aspirin -ibuprofen -ketoprofen -naproxen This list may not describe all possible interactions. Give your health care provider a list of all the medicines, herbs, non-prescription drugs, or dietary supplements you use. Also tell them if you smoke, drink alcohol, or use illegal drugs. Some items may interact with your medicine. What should I watch for while using this medicine? Your condition will be monitored carefully while you are receiving this medicine. You will need important blood work done while you are taking this  medicine. This drug may make you feel generally unwell. This is not uncommon, as chemotherapy can affect healthy cells as well as cancer cells. Report any side effects. Continue your course of treatment even though you feel ill unless your doctor tells you to stop. Your urine may turn red for a few days after your dose. This is not blood. If your urine is dark or brown, call your doctor. In some cases, you may be given additional medicines to help with side effects. Follow all directions for their use. Call your doctor or health care professional for advice if you get a fever, chills or sore throat, or other symptoms of a cold or flu. Do not treat yourself. This drug decreases your body's ability to fight infections. Try to avoid being around people who are sick. This medicine may increase your risk to bruise or bleed. Call your doctor or health care professional if you notice any unusual bleeding. Be careful brushing and flossing your teeth or using a toothpick because you may get an infection or bleed more easily. If you have any dental work done, tell your dentist you are receiving this medicine. Avoid taking products that contain aspirin, acetaminophen, ibuprofen, naproxen, or ketoprofen unless instructed by your doctor. These medicines may hide a fever. Men and women of childbearing age should use effective birth control methods while using taking this medicine. Do not become  pregnant while taking this medicine. There is a potential for serious side effects to an unborn child. Talk to your health care professional or pharmacist for more information. Do not breast-feed an infant while taking this medicine. Do not let others touch your urine or other body fluids for 5 days after each treatment with this medicine. Caregivers should wear latex gloves to avoid touching body fluids during this time. There is a maximum amount of this medicine you should receive throughout your life. The amount depends on the  medical condition being treated and your overall health. Your doctor will watch how much of this medicine you receive in your lifetime. Tell your doctor if you have taken this medicine before. What side effects may I notice from receiving this medicine? Side effects that you should report to your doctor or health care professional as soon as possible: -allergic reactions like skin rash, itching or hives, swelling of the face, lips, or tongue -low blood counts - this medicine may decrease the number of white blood cells, red blood cells and platelets. You may be at increased risk for infections and bleeding. -signs of infection - fever or chills, cough, sore throat, pain or difficulty passing urine -signs of decreased platelets or bleeding - bruising, pinpoint red spots on the skin, black, tarry stools, blood in the urine -signs of decreased red blood cells - unusually weak or tired, fainting spells, lightheadedness -breathing problems -chest pain -fast, irregular heartbeat -mouth sores -nausea, vomiting -pain, swelling, redness at site where injected -pain, tingling, numbness in the hands or feet -swelling of ankles, feet, or hands -unusual bleeding or bruising Side effects that usually do not require medical attention (report to your doctor or health care professional if they continue or are bothersome): -diarrhea -facial flushing -hair loss -loss of appetite -missed menstrual periods -nail discoloration or damage -red or watery eyes -red colored urine -stomach upset This list may not describe all possible side effects. Call your doctor for medical advice about side effects. You may report side effects to FDA at 1-800-FDA-1088. Where should I keep my medicine? This drug is given in a hospital or clinic and will not be stored at home. NOTE: This sheet is a summary. It may not cover all possible information. If you have questions about this medicine, talk to your doctor, pharmacist, or  health care provider.  2014, [employer]. (2012-04-27 09:54:34) Vinblastine injection What is this medicine? VINBLASTINE (vin BLAS teen) is a chemotherapy drug. It slows the growth of cancer cells. This medicine is used to treat many types of cancer like breast cancer, testicular cancer, Hodgkin's disease, non-Hodgkin's lymphoma, and sarcoma. This medicine may be used for other purposes; ask your health care provider or pharmacist if you have questions. COMMON BRAND NAME(S): Velban What should I tell my health care provider before I take this medicine? They need to know if you have any of these conditions: -blood disorders -dental disease -gout -infection (especially a virus infection such as chickenpox, cold sores, or herpes) -liver disease -lung disease -nervous system disease -recent or ongoing radiation therapy -an unusual or allergic reaction to vinblastine, other chemotherapy agents, other medicines, foods, dyes, or preservatives -pregnant or trying to get pregnant -breast-feeding How should I use this medicine? This drug is given as an infusion into a vein. It is administered in a hospital or clinic by a specially trained health care professional. If you have pain, swelling, burning or any unusual feeling around the site of your injection, tell  your health care professional right away. Talk to your pediatrician regarding the use of this medicine in children. While this drug may be prescribed for selected conditions, precautions do apply. Overdosage: If you think you have taken too much of this medicine contact a poison control center or emergency room at once. NOTE: This medicine is only for you. Do not share this medicine with others. What if I miss a dose? It is important not to miss your dose. Call your doctor or health care professional if you are unable to keep an appointment. What may interact with this medicine? Do not take this medicine with any of the following  medications: -erythromycin -itraconazole -mibefradil -voriconazole This medicine may also interact with the following medications: -cyclosporine -fluconazole -ketoconazole -medicines for seizures like phenytoin -medicines to increase blood counts like filgrastim, pegfilgrastim, sargramostim -vaccines -verapamil Talk to your doctor or health care professional before taking any of these medicines: -acetaminophen -aspirin -ibuprofen -ketoprofen -naproxen This list may not describe all possible interactions. Give your health care provider a list of all the medicines, herbs, non-prescription drugs, or dietary supplements you use. Also tell them if you smoke, drink alcohol, or use illegal drugs. Some items may interact with your medicine. What should I watch for while using this medicine? Your condition will be monitored carefully while you are receiving this medicine. You will need important blood work done while you are taking this medicine. This drug may make you feel generally unwell. This is not uncommon, as chemotherapy can affect healthy cells as well as cancer cells. Report any side effects. Continue your course of treatment even though you feel ill unless your doctor tells you to stop. In some cases, you may be given additional medicines to help with side effects. Follow all directions for their use. Call your doctor or health care professional for advice if you get a fever, chills or sore throat, or other symptoms of a cold or flu. Do not treat yourself. This drug decreases your body's ability to fight infections. Try to avoid being around people who are sick. This medicine may increase your risk to bruise or bleed. Call your doctor or health care professional if you notice any unusual bleeding. Be careful brushing and flossing your teeth or using a toothpick because you may get an infection or bleed more easily. If you have any dental work done, tell your dentist you are receiving this  medicine. Avoid taking products that contain aspirin, acetaminophen, ibuprofen, naproxen, or ketoprofen unless instructed by your doctor. These medicines may hide a fever. Do not become pregnant while taking this medicine. Women should inform their doctor if they wish to become pregnant or think they might be pregnant. There is a potential for serious side effects to an unborn child. Talk to your health care professional or pharmacist for more information. Do not breast-feed an infant while taking this medicine. Men may have a lower sperm count while taking this medicine. Talk to your doctor if you plan to father a child. What side effects may I notice from receiving this medicine? Side effects that you should report to your doctor or health care professional as soon as possible: -allergic reactions like skin rash, itching or hives, swelling of the face, lips, or tongue -low blood counts - This drug may decrease the number of white blood cells, red blood cells and platelets. You may be at increased risk for infections and bleeding. -signs of infection - fever or chills, cough, sore throat, pain or  difficulty passing urine -signs of decreased platelets or bleeding - bruising, pinpoint red spots on the skin, black, tarry stools, nosebleeds -signs of decreased red blood cells - unusually weak or tired, fainting spells, lightheadedness -breathing problems -changes in hearing -change in the amount of urine -chest pain -high blood pressure -mouth sores -nausea and vomiting -pain, swelling, redness or irritation at the injection site -pain, tingling, numbness in the hands or feet -problems with balance, dizziness -seizures Side effects that usually do not require medical attention (report to your doctor or health care professional if they continue or are bothersome): -constipation -hair loss -jaw pain -loss of appetite -sensitivity to light -stomach pain -tumor pain This list may not describe  all possible side effects. Call your doctor for medical advice about side effects. You may report side effects to FDA at 1-800-FDA-1088. Where should I keep my medicine? This drug is given in a hospital or clinic and will not be stored at home. NOTE: This sheet is a summary. It may not cover all possible information. If you have questions about this medicine, talk to your doctor, pharmacist, or health care provider.  2014, [employer]. (2007-09-27 17:15:59)

## 2013-06-24 ENCOUNTER — Ambulatory Visit (HOSPITAL_COMMUNITY): Payer: PRIVATE HEALTH INSURANCE

## 2013-06-28 NOTE — Progress Notes (Addendum)
SPOKE TO PATIENT AND ASK HIM TO CALL [facility] AND HE SAID HE WOULD CALL HER ON Monday 06/20/13.  06/28/13 SUMA SAID HE NEVER CALLED.  I FAXED HER A 13 PAGE UPDATE.

## 2013-07-01 ENCOUNTER — Encounter: Payer: Self-pay | Admitting: Internal Medicine

## 2013-07-01 ENCOUNTER — Ambulatory Visit (HOSPITAL_BASED_OUTPATIENT_CLINIC_OR_DEPARTMENT_OTHER): Payer: PRIVATE HEALTH INSURANCE

## 2013-07-01 ENCOUNTER — Ambulatory Visit (HOSPITAL_BASED_OUTPATIENT_CLINIC_OR_DEPARTMENT_OTHER): Payer: PRIVATE HEALTH INSURANCE | Admitting: Internal Medicine

## 2013-07-01 ENCOUNTER — Other Ambulatory Visit (HOSPITAL_BASED_OUTPATIENT_CLINIC_OR_DEPARTMENT_OTHER): Payer: PRIVATE HEALTH INSURANCE

## 2013-07-01 ENCOUNTER — Telehealth: Payer: Self-pay | Admitting: Internal Medicine

## 2013-07-01 VITALS — BP 116/70 | HR 79 | Temp 98.3°F | Resp 21 | Ht 70.0 in | Wt 218.5 lb

## 2013-07-01 DIAGNOSIS — C8120 Mixed cellularity classical Hodgkin lymphoma, unspecified site: Secondary | ICD-10-CM

## 2013-07-01 DIAGNOSIS — E039 Hypothyroidism, unspecified: Secondary | ICD-10-CM

## 2013-07-01 DIAGNOSIS — E669 Obesity, unspecified: Secondary | ICD-10-CM

## 2013-07-01 DIAGNOSIS — Z5111 Encounter for antineoplastic chemotherapy: Secondary | ICD-10-CM

## 2013-07-01 DIAGNOSIS — E119 Type 2 diabetes mellitus without complications: Secondary | ICD-10-CM

## 2013-07-01 DIAGNOSIS — I519 Heart disease, unspecified: Secondary | ICD-10-CM

## 2013-07-01 DIAGNOSIS — C8128 Mixed cellularity classical Hodgkin lymphoma, lymph nodes of multiple sites: Secondary | ICD-10-CM

## 2013-07-01 DIAGNOSIS — I10 Essential (primary) hypertension: Secondary | ICD-10-CM

## 2013-07-01 DIAGNOSIS — Z809 Family history of malignant neoplasm, unspecified: Secondary | ICD-10-CM

## 2013-07-01 DIAGNOSIS — E079 Disorder of thyroid, unspecified: Secondary | ICD-10-CM

## 2013-07-01 LAB — CBC WITH DIFFERENTIAL/PLATELET
BASO%: 1.9 % (ref 0.0–2.0)
Basophils Absolute: 0.1 10*3/uL (ref 0.0–0.1)
EOS%: 3.7 % (ref 0.0–7.0)
Eosinophils Absolute: 0.2 10*3/uL (ref 0.0–0.5)
HEMATOCRIT: 33.5 % — AB (ref 38.4–49.9)
HGB: 11.5 g/dL — ABNORMAL LOW (ref 13.0–17.1)
LYMPH#: 2.2 10*3/uL (ref 0.9–3.3)
LYMPH%: 36.5 % (ref 14.0–49.0)
MCH: 30.7 pg (ref 27.2–33.4)
MCHC: 34.3 g/dL (ref 32.0–36.0)
MCV: 89.5 fL (ref 79.3–98.0)
MONO#: 0.9 10*3/uL (ref 0.1–0.9)
MONO%: 15.5 % — ABNORMAL HIGH (ref 0.0–14.0)
NEUT#: 2.5 10*3/uL (ref 1.5–6.5)
NEUT%: 42.4 % (ref 39.0–75.0)
PLATELETS: 359 10*3/uL (ref 140–400)
RBC: 3.75 10*6/uL — ABNORMAL LOW (ref 4.20–5.82)
RDW: 14.7 % — ABNORMAL HIGH (ref 11.0–14.6)
WBC: 6 10*3/uL (ref 4.0–10.3)

## 2013-07-01 LAB — COMPREHENSIVE METABOLIC PANEL (CC13)
ALK PHOS: 70 U/L (ref 40–150)
ALT: 36 U/L (ref 0–55)
AST: 28 U/L (ref 5–34)
Albumin: 3.2 g/dL — ABNORMAL LOW (ref 3.5–5.0)
Anion Gap: 9 mEq/L (ref 3–11)
BILIRUBIN TOTAL: 0.26 mg/dL (ref 0.20–1.20)
BUN: 9.6 mg/dL (ref 7.0–26.0)
CO2: 23 mEq/L (ref 22–29)
CREATININE: 1 mg/dL (ref 0.7–1.3)
Calcium: 9.5 mg/dL (ref 8.4–10.4)
Chloride: 103 mEq/L (ref 98–109)
Glucose: 286 mg/dl — ABNORMAL HIGH (ref 70–140)
Potassium: 4.4 mEq/L (ref 3.5–5.1)
Sodium: 136 mEq/L (ref 136–145)
Total Protein: 6.9 g/dL (ref 6.4–8.3)

## 2013-07-01 LAB — LACTATE DEHYDROGENASE (CC13): LDH: 214 U/L (ref 125–245)

## 2013-07-01 MED ORDER — DEXAMETHASONE SODIUM PHOSPHATE 20 MG/5ML IJ SOLN
INTRAMUSCULAR | Status: AC
  Filled 2013-07-01: qty 5

## 2013-07-01 MED ORDER — SODIUM CHLORIDE 0.9 % IV SOLN
Freq: Once | INTRAVENOUS | Status: AC
  Administered 2013-07-01: 12:00:00 via INTRAVENOUS

## 2013-07-01 MED ORDER — SODIUM CHLORIDE 0.9 % IJ SOLN
10.0000 mL | INTRAMUSCULAR | Status: DC | PRN
  Administered 2013-07-01: 10 mL
  Filled 2013-07-01: qty 10

## 2013-07-01 MED ORDER — SODIUM CHLORIDE 0.9 % IV SOLN
10.0000 [IU]/m2 | Freq: Once | INTRAVENOUS | Status: AC
  Administered 2013-07-01: 22 [IU] via INTRAVENOUS
  Filled 2013-07-01: qty 7.33

## 2013-07-01 MED ORDER — SODIUM CHLORIDE 0.9 % IV SOLN
375.0000 mg/m2 | Freq: Once | INTRAVENOUS | Status: AC
  Administered 2013-07-01: 800 mg via INTRAVENOUS
  Filled 2013-07-01: qty 40

## 2013-07-01 MED ORDER — DEXAMETHASONE SODIUM PHOSPHATE 20 MG/5ML IJ SOLN
20.0000 mg | Freq: Once | INTRAMUSCULAR | Status: AC
  Administered 2013-07-01: 20 mg via INTRAVENOUS

## 2013-07-01 MED ORDER — HEPARIN SOD (PORK) LOCK FLUSH 100 UNIT/ML IV SOLN
500.0000 [IU] | Freq: Once | INTRAVENOUS | Status: AC | PRN
  Administered 2013-07-01: 500 [IU]
  Filled 2013-07-01: qty 5

## 2013-07-01 MED ORDER — DOXORUBICIN HCL CHEMO IV INJECTION 2 MG/ML
25.0000 mg/m2 | Freq: Once | INTRAVENOUS | Status: AC
  Administered 2013-07-01: 54 mg via INTRAVENOUS
  Filled 2013-07-01: qty 27

## 2013-07-01 MED ORDER — VINBLASTINE SULFATE CHEMO INJECTION 1 MG/ML
6.0000 mg/m2 | Freq: Once | INTRAVENOUS | Status: AC
  Administered 2013-07-01: 13 mg via INTRAVENOUS
  Filled 2013-07-01: qty 13

## 2013-07-01 MED ORDER — ONDANSETRON 16 MG/50ML IVPB (CHCC)
INTRAVENOUS | Status: AC
  Filled 2013-07-01: qty 16

## 2013-07-01 MED ORDER — ONDANSETRON 16 MG/50ML IVPB (CHCC)
16.0000 mg | Freq: Once | INTRAVENOUS | Status: AC
  Administered 2013-07-01: 16 mg via INTRAVENOUS

## 2013-07-01 NOTE — Patient Instructions (Signed)
[facility] Discharge Instructions for Patients Receiving Chemotherapy  Today you received the following chemotherapy agents :  Adriamycin, Velban, Bleomycin, Dacarbazine.  To help prevent nausea and vomiting after your treatment, we encourage you to take your nausea medication as instructed by your physician.  Take Zofran 8mg  by mouth 2 times daily for 3 days - start day after chemo.  Can also take Compazine 10mg  every 6 hours as needed for nausea.  DO NOT Drive after taking Compazine since it can cause drowsiness.   If you develop nausea and vomiting that is not controlled by your nausea medication, call the clinic.   BELOW ARE SYMPTOMS THAT SHOULD BE REPORTED IMMEDIATELY:  *FEVER GREATER THAN 100.5 F  *CHILLS WITH OR WITHOUT FEVER  NAUSEA AND VOMITING THAT IS NOT CONTROLLED WITH YOUR NAUSEA MEDICATION  *UNUSUAL SHORTNESS OF BREATH  *UNUSUAL BRUISING OR BLEEDING  TENDERNESS IN MOUTH AND THROAT WITH OR WITHOUT PRESENCE OF ULCERS  *URINARY PROBLEMS  *BOWEL PROBLEMS  UNUSUAL RASH Items with * indicate a potential emergency and should be followed up as soon as possible.  Feel free to call the clinic you have any questions or concerns. The clinic phone number is [phone].

## 2013-07-01 NOTE — Progress Notes (Signed)
[facility] OFFICE PROGRESS NOTE  Pcp Not In System No address on file  DIAGNOSIS: Hodgkin's disease with mixed cellularity - Plan: CBC with Differential, Basic metabolic panel (Bmet) - [facility], Lactate dehydrogenase (LDH) - [facility], CBC with Differential, Comprehensive metabolic panel (Cmet) - [facility], Lactate dehydrogenase (LDH) - [facility], NM PET Image Restag (PS) Skull Base To Thigh  Chief Complaint  Patient presents with  . Hodgkin's disease with mixed cellularity    CURRENT TREATMENT: ABVD q 2 weeks started on 02/11/2013. He is scheduled for cycle #6, day #1 on 07/01/2013.     Hodgkin's disease with mixed cellularity   12/03/2012 Imaging CT Neck: Lymphadenopathy left cervical chain with right-sided axillary LAD.  Heterogenous thyroid with an 9 mm isthmus lesion.     12/29/2012 Imaging CXR: No bulky LAD. No active cardiopulmonary disease.    12/31/2012 Pathology Classical Hodgkin's disease of mixed cellularity.    12/31/2012 Procedure Excisional biopsy of left cervical/supraclavicular LAD.    01/14/2013 Initial Diagnosis Hodgkin's disease with mixed cellularity, Stage IIIB. (night sweats).  ESR greater than 50. LDH of 297.  IPS, 3 points for male, age greater than 79, albumin less than 4; 60% 5 year FFP, 78% 5-year OS Haseelever D, [id number].    01/18/2013 Imaging Echo: LV EF 50-55%.  Abnormal L ventricular relaxation (grade 1 diastolic dysfunction). Referred to cardiology Rowland Lathe) with recommendations to repeat Echo s/p 4 cycles.    01/24/2013 Cancer Staging PET/CT: Hypermetabolic LAD identified w/in the neck, chest, abdomen, and pelvis.  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  CT C/A/P:  2 nonspecific pulmonary nodules LLL, 3 mm and RLL 4 mm.    01/25/2013 Bone Marrow Biopsy Bone marrow without lymphoma involvement.  Normal cytogenetics. Normocellular for age with trileage hematopoiesis without tumor identifed.    02/03/2013 Procedure R port-a-cath placment.    02/04/2013 Procedure S/p successful complex coil, plug and liquid embolization of left hepatic artery aneurysm by IR (Dr. Karin Golden)   02/11/2013 -  Chemotherapy Started ABVD q 28 days (Adriamycin 25 mg/m2, vinblastine 6 mg/m2, bleomycin 10 units/m2, dacarbazine 275 mg/m2.   based on Duggan DB, JCO, 2003. He is scheduled for cycle #5 on 06/03/2013.   02/11/2013 Family History Referral to genetics.  Jewish acenstry with hodgkin's disease in father; mother died of an abdominal aneurysm.    04/06/2013 Imaging PET/CT: Significantly improved thoracic, retroperitoneal, and pelvic lymphadenopathy, as described above, max SUV 2.9. Deauville score 2.        INTERVAL HISTORY: [name] [name] 48 y.o. male with a history of classical hodgkin's disease of mixed cellularity, stage III b is here for one month follow-up.  He was last seen by me on 06/03/2013. He continues to deny fevers or chills or further night sweats or weight loss.  He denies any breathing problems.  He reports good energy and appetite.    MEDICAL HISTORY: Past Medical History  Diagnosis Date  . Hypertension   . Diabetes mellitus without complication     Pre-diabetes  . Depression   . Headache(784.0)   . Thyroid disease     Hx: of  . Cancer 2015    cervical lymphadenopathy  . Hodgkin lymphoma 2015  . Hepatic artery aneurysm 2015    INTERIM HISTORY: has Cervical lymphadenopathy; Hodgkin's disease with mixed cellularity; DM (diabetes mellitus); Hyperlipemia; Obesity; Hypothyroidism; Aneurysm artery, hepatic; Family history of cancer; Diastolic dysfunction; Thyroid lesion; Essential hypertension; At risk for coronary artery disease; and Chemotherapy-induced cardiomyopathy on his  problem list.    ALLERGIES:  has No Known Allergies.  MEDICATIONS: has a current medication list which includes the following prescription(s): azelastine-fluticasone, carvedilol, dexamethasone, eszopiclone, fenofibrate, fexofenadine, levothyroxine,  lidocaine-prilocaine, lisinopril, lorazepam, lovastatin, metformin, montelukast, naproxen sodium, ondansetron, oxycodone, potassium chloride, prochlorperazine, sertraline, and vitamin d (ergocalciferol), and the following Facility-Administered Medications: dacarbazine (DTIC) 800 mg in sodium chloride 0.9 % 250 mL chemo infusion, heparin lock flush, and sodium chloride.  SURGICAL HISTORY:  Past Surgical History  Procedure Laterality Date  . Tonsillectomy    . Mass excision Left 12/31/2012    Procedure: CERVICAL LYMPH NODE BIOPSY;  Surgeon: [name], MD;  Location: [facility];  Service: General;  Laterality: Left;    REVIEW OF SYSTEMS:   Constitutional: Denies fevers, chills or abnormal weight loss Eyes: Denies blurriness of vision Ears, nose, mouth, throat, and face: Denies mucositis or sore throat Respiratory: Denies cough, dyspnea or wheezes Cardiovascular: Denies palpitation, chest discomfort or lower extremity swelling Gastrointestinal:  Denies nausea, heartburn or change in bowel habits Skin: Denies abnormal skin rashes Lymphatics: Denies new lymphadenopathy or easy bruising Neurological:Denies numbness, tingling or new weaknesses Behavioral/Psych: Mood is stable, no new changes  All other systems were reviewed with the patient and are negative.  PHYSICAL EXAMINATION: ECOG PERFORMANCE STATUS: 0 - Asymptomatic  Blood pressure 116/70, pulse 79, temperature 98.3 F (36.8 C), temperature source Oral, resp. rate 21, height 5' 10"  (1.778 m), weight 218 lb 8 oz (99.111 kg), SpO2 97.00%.  GENERAL:alert, no distress and comfortable; well developed and well nourished. Alopecia SKIN: skin color, texture, turgor are normal, no rashes or significant lesions; R portacath. EYES: normal, Conjunctiva are pink and non-injected, sclera clear OROPHARYNX:no exudate, no erythema and lips, buccal mucosa, and tongue normal  NECK: supple, thyroid normal size, non-tender, without nodularity LYMPH:  no  palpable lymphadenopathy in the cervical; small less than a cm adenopathy in the R supraclavicular area. Decreased adenopathy (less than 0.5 cm) in the L axillary. LUNGS: clear to auscultation with normal breathing effort, no wheezes or rhonchi HEART: regular rate & rhythm and no murmurs and no lower extremity edema ABDOMEN:abdomen soft, non-tender and normal bowel sounds Musculoskeletal:no cyanosis of digits and no clubbing  NEURO: alert & oriented x 3 with fluent speech, no focal motor/sensory deficits  Labs:  Lab Results  Component Value Date   WBC 6.0 07/01/2013   HGB 11.5* 07/01/2013   HCT 33.5* 07/01/2013   MCV 89.5 07/01/2013   PLT 359 07/01/2013   NEUTROABS 2.5 07/01/2013      Chemistry      Component Value Date/Time   NA 136 07/01/2013 0956   NA 137 02/05/2013 0528   K 4.4 07/01/2013 0956   K 4.1 02/05/2013 0528   CL 98 02/05/2013 0528   CO2 23 07/01/2013 0956   CO2 28 02/05/2013 0528   BUN 9.6 07/01/2013 0956   BUN 14 02/05/2013 0528   CREATININE 1.0 07/01/2013 0956   CREATININE 0.99 02/05/2013 0528      Component Value Date/Time   CALCIUM 9.5 07/01/2013 0956   CALCIUM 9.0 02/05/2013 0528   ALKPHOS 70 07/01/2013 0956   ALKPHOS 97 02/05/2013 0528   AST 28 07/01/2013 0956   AST 24 02/05/2013 0528   ALT 36 07/01/2013 0956   ALT 12 02/05/2013 0528   BILITOT 0.26 07/01/2013 0956   BILITOT 0.3 02/05/2013 0528       Basic Metabolic Panel:  Recent Labs Lab 07/01/13 0956  NA 136  K 4.4  CO2  23  GLUCOSE 286*  BUN 9.6  CREATININE 1.0  CALCIUM 9.5   GFR Estimated Creatinine Clearance: 107.7 ml/min (by C-G formula based on Cr of 1). Liver Function Tests:  Recent Labs Lab 07/01/13 0956  AST 28  ALT 36  ALKPHOS 70  BILITOT 0.26  PROT 6.9  ALBUMIN 3.2*   No results found for this basename: LIPASE, AMYLASE,  in the last 168 hours No results found for this basename: AMMONIA,  in the last 168 hours Coagulation profile No results found for this basename: INR, PROTIME,  in the  last 168 hours  CBC:  Recent Labs Lab 07/01/13 0956  WBC 6.0  NEUTROABS 2.5  HGB 11.5*  HCT 33.5*  MCV 89.5  PLT 359    Anemia work up No results found for this basename: VITAMINB12, FOLATE, FERRITIN, TIBC, IRON, RETICCTPCT,  in the last 72 hours  Studies:  No results found.   RADIOGRAPHIC STUDIES: None  ASSESSMENT: [name] [name] 48 y.o. male with a history of Hodgkin's disease with mixed cellularity - Plan: CBC with Differential, Basic metabolic panel (Bmet) - [facility], Lactate dehydrogenase (LDH) - [facility], CBC with Differential, Comprehensive metabolic panel (Cmet) - [facility], Lactate dehydrogenase (LDH) - [facility], NM PET Image Restag (PS) Skull Base To Thigh   PLAN:  1. Classical Hodgkin's Disease with mixed cellularity, c/w Stage IIIB (if pulmonary nodules which are less than 1cm are negative). Bone marrow is negative for lymphoma. IPS for Hodgkin's disease (3 points for male, age greater than 6, albumin less than 4; 60% 5 year FFP, 78% 5-year overall survival based on Haseelever D, NEJM, 1998).   --We will continue chemotherapy with ABVD x 6 cycles (category 2A) q28 days (doxorubicin, bleomycin, vinblastine, and dacarbazine) (Duggan DB, JCO, 2003). We had a detailed discussion regarding the benefits of including approximately 80 percent of patients with advanced stage HL will attain a complete response after treatment with ABVD. Up to one-quarter of patients will have disease progression requiring further therapy; half of those will have long term survival with autologous hematopoietic cell transplantation. Overall survival rates at 4, 7, and 10 years are approximately 90, 75, and 55 percent, respectively.  --We will continue Day #1 chemotherapy as follows:   Day 1, Cycle 6A 07/01/2013  Doxorubicin (Adriamycin) 25 mg/m2  Vinblastine 6 mg/m2  Bleomycin 10 units/m2  Dacarbazine 375 mg/m2   -He will have Day15, Cycle 5B on 07/03. We obtained an restaging PET-CT following Cycle #2  (NCCN Guidelines, Version 2.2014, HODG-9) to assess response and further treatment based on deaville criteria for response. Deaville criteria was 2 so therefore we will continue with this ABVD therapy.  We will repeat PET following 6 cycles of ABVD on 07/15.   2. Left Hepatic artery aneurysm S/p embolization by IR on 02-04-2013.  --He had follow-up with Dr. Geroge Baseman s/p coil embolization on 02/04/2013.  His MRI of brain and CTA was as noted above.  He will continue annual follow up with Dr. Geroge Baseman. His liver enzymes remain normal limits. He was counseled to report any symptoms of abdominal pain and avoid any sports or high energy exercises. He voiced understanding and agreement with this plan.   3. Heterogeneous thyroid with a in 9 mm isthmus lesion.  --Consider further evaluation with thyroid ultrasound. If patient is clinically hyperthyroid, consider nuclear medicine thyroid uptake and scan. TSH was 9.659 on 7/84/6962.   4. Diastolic Dysfunction, Grade I.  --Echocardiogram as noted above. Patient has a history of DM2, Hypertension.  Repeat Echocardiogram following cycle #4 obtained and reviewed with the patient today.  It is negative for diastolic dysfunction and a normal EF of 65%.    5. Family History of cancer, cancer prior to age of 57.  --Patient has Jewish ancestry and reports father was also diagnosed with hodgkin's disease. He declined BRCA testing.  He was seen by genetic counselor on 03/28/2013.   6. DM2.  --Continue Metformin 500 mg daily per PCP/endo.   7. Hypertension/HLD/CAD.  --Continue lisinopril 10 mg daily.  --Continue hydrochlorothiazide 50 mg daily with KCL 10 mEQ daily.  --Continue Lovastatin 500 mg daily.   8. Hypothyroidism  --Continue levothyroxine 200 mg daily per endo. Checked TSH, T4. TSH slightly high. Thyroid ultrasound as needed. (see #4)   9. Obesity  --Continue dieting and weight lost.   10. Follow-up.  --Patient will require follow-up in 4 weeks s/p  receipt of PET on 07/15 with labs on the same day.  He will have finished 6 cycles of  ABVD.   All questions were answered. The patient knows to call the clinic with any problems, questions or concerns. We can certainly see the patient much sooner if necessary.  I spent 15 minutes counseling the patient face to face. The total time spent in the appointment was 25 minutes.    Brisia Schuermann, MD 07/01/2013 1:45 PM

## 2013-07-01 NOTE — Telephone Encounter (Signed)
gv and printed appt shed and avs for pt for July/Aug/OCT and Dec....sed added tx.

## 2013-07-14 ENCOUNTER — Other Ambulatory Visit: Payer: Self-pay | Admitting: Internal Medicine

## 2013-07-14 ENCOUNTER — Ambulatory Visit (HOSPITAL_BASED_OUTPATIENT_CLINIC_OR_DEPARTMENT_OTHER): Payer: PRIVATE HEALTH INSURANCE

## 2013-07-14 ENCOUNTER — Other Ambulatory Visit: Payer: PRIVATE HEALTH INSURANCE

## 2013-07-14 VITALS — BP 123/85 | HR 90 | Temp 98.3°F | Resp 18

## 2013-07-14 DIAGNOSIS — C8128 Mixed cellularity classical Hodgkin lymphoma, lymph nodes of multiple sites: Secondary | ICD-10-CM

## 2013-07-14 DIAGNOSIS — C8120 Mixed cellularity classical Hodgkin lymphoma, unspecified site: Secondary | ICD-10-CM

## 2013-07-14 DIAGNOSIS — Z5111 Encounter for antineoplastic chemotherapy: Secondary | ICD-10-CM

## 2013-07-14 LAB — LACTATE DEHYDROGENASE (CC13): LDH: 186 U/L (ref 125–245)

## 2013-07-14 LAB — CBC WITH DIFFERENTIAL/PLATELET
BASO%: 2 % (ref 0.0–2.0)
BASOS ABS: 0.1 10*3/uL (ref 0.0–0.1)
EOS ABS: 0.3 10*3/uL (ref 0.0–0.5)
EOS%: 4.8 % (ref 0.0–7.0)
HCT: 34.4 % — ABNORMAL LOW (ref 38.4–49.9)
HEMOGLOBIN: 11.5 g/dL — AB (ref 13.0–17.1)
LYMPH%: 43.4 % (ref 14.0–49.0)
MCH: 29.9 pg (ref 27.2–33.4)
MCHC: 33.3 g/dL (ref 32.0–36.0)
MCV: 89.8 fL (ref 79.3–98.0)
MONO#: 0.8 10*3/uL (ref 0.1–0.9)
MONO%: 14.9 % — AB (ref 0.0–14.0)
NEUT%: 34.9 % — ABNORMAL LOW (ref 39.0–75.0)
NEUTROS ABS: 1.8 10*3/uL (ref 1.5–6.5)
PLATELETS: 383 10*3/uL (ref 140–400)
RBC: 3.83 10*6/uL — ABNORMAL LOW (ref 4.20–5.82)
RDW: 14.9 % — ABNORMAL HIGH (ref 11.0–14.6)
WBC: 5.3 10*3/uL (ref 4.0–10.3)
lymph#: 2.3 10*3/uL (ref 0.9–3.3)

## 2013-07-14 LAB — BASIC METABOLIC PANEL (CC13)
Anion Gap: 12 mEq/L — ABNORMAL HIGH (ref 3–11)
BUN: 12.5 mg/dL (ref 7.0–26.0)
CALCIUM: 9.5 mg/dL (ref 8.4–10.4)
CO2: 21 mEq/L — ABNORMAL LOW (ref 22–29)
CREATININE: 1.1 mg/dL (ref 0.7–1.3)
Chloride: 105 mEq/L (ref 98–109)
GLUCOSE: 203 mg/dL — AB (ref 70–140)
Potassium: 4.2 mEq/L (ref 3.5–5.1)
SODIUM: 137 meq/L (ref 136–145)

## 2013-07-14 MED ORDER — SODIUM CHLORIDE 0.9 % IV SOLN
Freq: Once | INTRAVENOUS | Status: AC
  Administered 2013-07-14: 09:00:00 via INTRAVENOUS

## 2013-07-14 MED ORDER — SODIUM CHLORIDE 0.9 % IV SOLN
375.0000 mg/m2 | Freq: Once | INTRAVENOUS | Status: AC
  Administered 2013-07-14: 800 mg via INTRAVENOUS
  Filled 2013-07-14: qty 40

## 2013-07-14 MED ORDER — ONDANSETRON 16 MG/50ML IVPB (CHCC)
16.0000 mg | Freq: Once | INTRAVENOUS | Status: AC
  Administered 2013-07-14: 16 mg via INTRAVENOUS

## 2013-07-14 MED ORDER — SODIUM CHLORIDE 0.9 % IV SOLN
10.0000 [IU]/m2 | Freq: Once | INTRAVENOUS | Status: AC
  Administered 2013-07-14: 22 [IU] via INTRAVENOUS
  Filled 2013-07-14: qty 7.33

## 2013-07-14 MED ORDER — SODIUM CHLORIDE 0.9 % IJ SOLN
10.0000 mL | INTRAMUSCULAR | Status: DC | PRN
  Administered 2013-07-14: 10 mL
  Filled 2013-07-14: qty 10

## 2013-07-14 MED ORDER — HEPARIN SOD (PORK) LOCK FLUSH 100 UNIT/ML IV SOLN
500.0000 [IU] | Freq: Once | INTRAVENOUS | Status: AC | PRN
  Administered 2013-07-14: 500 [IU]
  Filled 2013-07-14: qty 5

## 2013-07-14 MED ORDER — DEXAMETHASONE SODIUM PHOSPHATE 20 MG/5ML IJ SOLN
INTRAMUSCULAR | Status: AC
  Filled 2013-07-14: qty 5

## 2013-07-14 MED ORDER — DOXORUBICIN HCL CHEMO IV INJECTION 2 MG/ML
25.0000 mg/m2 | Freq: Once | INTRAVENOUS | Status: AC
  Administered 2013-07-14: 54 mg via INTRAVENOUS
  Filled 2013-07-14: qty 27

## 2013-07-14 MED ORDER — DEXAMETHASONE SODIUM PHOSPHATE 20 MG/5ML IJ SOLN
20.0000 mg | Freq: Once | INTRAMUSCULAR | Status: AC
  Administered 2013-07-14: 20 mg via INTRAVENOUS

## 2013-07-14 MED ORDER — VINBLASTINE SULFATE CHEMO INJECTION 1 MG/ML
6.0000 mg/m2 | Freq: Once | INTRAVENOUS | Status: AC
  Administered 2013-07-14: 13 mg via INTRAVENOUS
  Filled 2013-07-14: qty 13

## 2013-07-14 MED ORDER — ONDANSETRON 16 MG/50ML IVPB (CHCC)
INTRAVENOUS | Status: AC
  Filled 2013-07-14: qty 16

## 2013-07-14 NOTE — Patient Instructions (Signed)
[facility] Discharge Instructions for Patients Receiving Chemotherapy  Today you received the following chemotherapy agents: Adriamycin, Bleomycin, Vinblastine, DTIC To help prevent nausea and vomiting after your treatment, we encourage you to take your nausea medication as prescribed.    If you develop nausea and vomiting that is not controlled by your nausea medication, call the clinic.   BELOW ARE SYMPTOMS THAT SHOULD BE REPORTED IMMEDIATELY:  *FEVER GREATER THAN 100.5 F  *CHILLS WITH OR WITHOUT FEVER  NAUSEA AND VOMITING THAT IS NOT CONTROLLED WITH YOUR NAUSEA MEDICATION  *UNUSUAL SHORTNESS OF BREATH  *UNUSUAL BRUISING OR BLEEDING  TENDERNESS IN MOUTH AND THROAT WITH OR WITHOUT PRESENCE OF ULCERS  *URINARY PROBLEMS  *BOWEL PROBLEMS  UNUSUAL RASH Items with * indicate a potential emergency and should be followed up as soon as possible.  Feel free to call the clinic you have any questions or concerns. The clinic phone number is [phone].

## 2013-07-27 ENCOUNTER — Other Ambulatory Visit: Payer: PRIVATE HEALTH INSURANCE

## 2013-07-28 ENCOUNTER — Encounter (HOSPITAL_COMMUNITY): Payer: Self-pay

## 2013-07-28 ENCOUNTER — Other Ambulatory Visit (HOSPITAL_BASED_OUTPATIENT_CLINIC_OR_DEPARTMENT_OTHER): Payer: PRIVATE HEALTH INSURANCE

## 2013-07-28 ENCOUNTER — Encounter (HOSPITAL_COMMUNITY)
Admission: RE | Admit: 2013-07-28 | Discharge: 2013-07-28 | Disposition: A | Discharge status: Home / Self Care | Payer: PRIVATE HEALTH INSURANCE | Source: Ambulatory Visit | Attending: Internal Medicine | Admitting: Internal Medicine

## 2013-07-28 DIAGNOSIS — C8120 Mixed cellularity classical Hodgkin lymphoma, unspecified site: Secondary | ICD-10-CM

## 2013-07-28 DIAGNOSIS — C8128 Mixed cellularity classical Hodgkin lymphoma, lymph nodes of multiple sites: Secondary | ICD-10-CM

## 2013-07-28 DIAGNOSIS — C8129 Mixed cellularity classical Hodgkin lymphoma, extranodal and solid organ sites: Secondary | ICD-10-CM | POA: Insufficient documentation

## 2013-07-28 LAB — COMPREHENSIVE METABOLIC PANEL (CC13)
ALBUMIN: 3.3 g/dL — AB (ref 3.5–5.0)
ALT: 28 U/L (ref 0–55)
AST: 27 U/L (ref 5–34)
Alkaline Phosphatase: 70 U/L (ref 40–150)
Anion Gap: 9 mEq/L (ref 3–11)
BUN: 11.5 mg/dL (ref 7.0–26.0)
CALCIUM: 9.8 mg/dL (ref 8.4–10.4)
CHLORIDE: 103 meq/L (ref 98–109)
CO2: 25 meq/L (ref 22–29)
Creatinine: 1 mg/dL (ref 0.7–1.3)
GLUCOSE: 193 mg/dL — AB (ref 70–140)
POTASSIUM: 4.6 meq/L (ref 3.5–5.1)
Sodium: 136 mEq/L (ref 136–145)
TOTAL PROTEIN: 7.1 g/dL (ref 6.4–8.3)
Total Bilirubin: 0.28 mg/dL (ref 0.20–1.20)

## 2013-07-28 LAB — CBC WITH DIFFERENTIAL/PLATELET
BASO%: 2.1 % — ABNORMAL HIGH (ref 0.0–2.0)
BASOS ABS: 0.1 10*3/uL (ref 0.0–0.1)
EOS ABS: 0.2 10*3/uL (ref 0.0–0.5)
EOS%: 3.9 % (ref 0.0–7.0)
HCT: 34.7 % — ABNORMAL LOW (ref 38.4–49.9)
HEMOGLOBIN: 11.6 g/dL — AB (ref 13.0–17.1)
LYMPH#: 2 10*3/uL (ref 0.9–3.3)
LYMPH%: 37.5 % (ref 14.0–49.0)
MCH: 29.6 pg (ref 27.2–33.4)
MCHC: 33.3 g/dL (ref 32.0–36.0)
MCV: 88.7 fL (ref 79.3–98.0)
MONO#: 1.1 10*3/uL — AB (ref 0.1–0.9)
MONO%: 20.8 % — ABNORMAL HIGH (ref 0.0–14.0)
NEUT#: 1.9 10*3/uL (ref 1.5–6.5)
NEUT%: 35.7 % — ABNORMAL LOW (ref 39.0–75.0)
Platelets: 435 10*3/uL — ABNORMAL HIGH (ref 140–400)
RBC: 3.91 10*6/uL — ABNORMAL LOW (ref 4.20–5.82)
RDW: 14.5 % (ref 11.0–14.6)
WBC: 5.3 10*3/uL (ref 4.0–10.3)

## 2013-07-28 LAB — GLUCOSE, CAPILLARY: GLUCOSE-CAPILLARY: 188 mg/dL — AB (ref 70–99)

## 2013-07-28 LAB — LACTATE DEHYDROGENASE (CC13): LDH: 201 U/L (ref 125–245)

## 2013-07-29 ENCOUNTER — Ambulatory Visit (HOSPITAL_BASED_OUTPATIENT_CLINIC_OR_DEPARTMENT_OTHER): Payer: PRIVATE HEALTH INSURANCE | Admitting: Internal Medicine

## 2013-07-29 ENCOUNTER — Telehealth: Payer: Self-pay | Admitting: Internal Medicine

## 2013-07-29 VITALS — BP 115/76 | HR 88 | Temp 98.8°F | Resp 18 | Ht 70.0 in | Wt 215.9 lb

## 2013-07-29 DIAGNOSIS — E669 Obesity, unspecified: Secondary | ICD-10-CM

## 2013-07-29 DIAGNOSIS — C8129 Mixed cellularity classical Hodgkin lymphoma, extranodal and solid organ sites: Secondary | ICD-10-CM

## 2013-07-29 DIAGNOSIS — I519 Heart disease, unspecified: Secondary | ICD-10-CM

## 2013-07-29 DIAGNOSIS — R05 Cough: Secondary | ICD-10-CM

## 2013-07-29 DIAGNOSIS — E119 Type 2 diabetes mellitus without complications: Secondary | ICD-10-CM

## 2013-07-29 DIAGNOSIS — R059 Cough, unspecified: Secondary | ICD-10-CM

## 2013-07-29 DIAGNOSIS — C8120 Mixed cellularity classical Hodgkin lymphoma, unspecified site: Secondary | ICD-10-CM

## 2013-07-29 DIAGNOSIS — J984 Other disorders of lung: Secondary | ICD-10-CM

## 2013-07-29 DIAGNOSIS — E039 Hypothyroidism, unspecified: Secondary | ICD-10-CM

## 2013-07-29 DIAGNOSIS — E079 Disorder of thyroid, unspecified: Secondary | ICD-10-CM

## 2013-07-29 NOTE — Telephone Encounter (Signed)
Spk w/pt advised of apt 07/23 9:45am PFT @[facility]  ...Marland KitchenMarland KitchenKJ

## 2013-07-29 NOTE — Progress Notes (Signed)
[facility] OFFICE PROGRESS NOTE  Pcp Not In System No address on file  DIAGNOSIS: Hodgkin's disease with mixed cellularity - Plan: Ambulatory referral to Pulmonology, Pulmonary function test, CBC with Differential, Comprehensive metabolic panel (Cmet) - [facility], Lactate dehydrogenase (LDH) - [facility]  Chief Complaint  Patient presents with  . Follow-up    PRIOR TREATMENT: ABVD q 2 weeks started on 02/11/2013. He completed cycle #6, day #1 on 07/01/2013.   CURRENT TREATMENT: Active surveillance.     Hodgkin's disease with mixed cellularity   12/03/2012 Imaging CT Neck: Lymphadenopathy left cervical chain with right-sided axillary LAD.  Heterogenous thyroid with an 9 mm isthmus lesion.     12/29/2012 Imaging CXR: No bulky LAD. No active cardiopulmonary disease.    12/31/2012 Pathology Classical Hodgkin's disease of mixed cellularity.    12/31/2012 Procedure Excisional biopsy of left cervical/supraclavicular LAD.    01/14/2013 Initial Diagnosis Hodgkin's disease with mixed cellularity, Stage IIIB. (night sweats).  ESR greater than 50. LDH of 297.  IPS, 3 points for male, age greater than 24, albumin less than 4; 60% 5 year FFP, 78% 5-year OS Haseelever D, [id number].    01/18/2013 Imaging Echo: LV EF 50-55%.  Abnormal L ventricular relaxation (grade 1 diastolic dysfunction). Referred to cardiology Rowland Lathe) with recommendations to repeat Echo s/p 4 cycles.    01/24/2013 Cancer Staging PET/CT: Hypermetabolic LAD identified w/in the neck, chest, abdomen, and pelvis.  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  CT C/A/P:  2 nonspecific pulmonary nodules LLL, 3 mm and RLL 4 mm.    01/25/2013 Bone Marrow Biopsy Bone marrow without lymphoma involvement.  Normal cytogenetics. Normocellular for age with trileage hematopoiesis without tumor identifed.    02/03/2013 Procedure R port-a-cath placment.    02/04/2013 Procedure S/p successful complex coil, plug and liquid embolization of left  hepatic artery aneurysm by IR (Dr. Karin Golden)   02/11/2013 -  Chemotherapy Started ABVD q 28 days (Adriamycin 25 mg/m2, vinblastine 6 mg/m2, bleomycin 10 units/m2, dacarbazine 275 mg/m2.   based on Duggan DB, JCO, 2003. He is scheduled for cycle #5 on 06/03/2013.   02/11/2013 Family History Referral to genetics.  Jewish acenstry with hodgkin's disease in father; mother died of an abdominal aneurysm.    04/06/2013 Imaging PET/CT: Significantly improved thoracic, retroperitoneal, and pelvic lymphadenopathy, as described above, max SUV 2.9. Deauville score 2.      07/28/2013 Imaging PET consistent with CR (Deaville 2).  However, new nodular airspace disease was noted. Referred to pulmonary and repeat PFTs ordered.    INTERVAL HISTORY: [name] [name] 48 y.o. male with a history of classical hodgkin's disease of mixed cellularity, stage III b is here for one month follow-up.  He was last seen by me on 07/01/2013. He continues to deny fevers or chills or further night sweats or weight loss.  He denies any breathing problems.  He does report a dry cough.  He had a PET done yesterday and is here to discuss the results. He reports good energy and appetite.    MEDICAL HISTORY: Past Medical History  Diagnosis Date  . Hypertension   . Diabetes mellitus without complication     Pre-diabetes  . Depression   . Headache(784.0)   . Thyroid disease     Hx: of  . Cancer 2015    cervical lymphadenopathy  . Hodgkin lymphoma 2015  . Hepatic artery aneurysm 2015    INTERIM HISTORY: has Cervical lymphadenopathy; Hodgkin's disease with mixed cellularity; DM (  diabetes mellitus); Hyperlipemia; Obesity; Hypothyroidism; Aneurysm artery, hepatic; Family history of cancer; Diastolic dysfunction; Thyroid lesion; Essential hypertension; At risk for coronary artery disease; and Chemotherapy-induced cardiomyopathy on his problem list.    ALLERGIES:  has No Known Allergies.  MEDICATIONS: has a current medication list  which includes the following prescription(s): carvedilol, dexamethasone, eszopiclone, fenofibrate, fexofenadine, levothyroxine, lidocaine-prilocaine, lisinopril, lorazepam, lovastatin, metformin, montelukast, naproxen sodium, ondansetron, oxycodone, potassium chloride, prochlorperazine, and sertraline.  SURGICAL HISTORY:  Past Surgical History  Procedure Laterality Date  . Tonsillectomy    . Mass excision Left 12/31/2012    Procedure: CERVICAL LYMPH NODE BIOPSY;  Surgeon: [name], MD;  Location: [facility];  Service: General;  Laterality: Left;    REVIEW OF SYSTEMS:   Constitutional: Denies fevers, chills or abnormal weight loss Eyes: Denies blurriness of vision Ears, nose, mouth, throat, and face: Denies mucositis or sore throat Respiratory: Denies cough, dyspnea or wheezes Cardiovascular: Denies palpitation, chest discomfort or lower extremity swelling Gastrointestinal:  Denies nausea, heartburn or change in bowel habits Skin: Denies abnormal skin rashes Lymphatics: Denies new lymphadenopathy or easy bruising Neurological:Denies numbness, tingling or new weaknesses Behavioral/Psych: Mood is stable, no new changes  All other systems were reviewed with the patient and are negative.  PHYSICAL EXAMINATION: ECOG PERFORMANCE STATUS: 0 - Asymptomatic  Blood pressure 115/76, pulse 88, temperature 98.8 F (37.1 C), temperature source Oral, resp. rate 18, height 5' 10" (1.778 m), weight 215 lb 14.4 oz (97.932 kg). Oxygen 96% of R.A.   GENERAL:alert, no distress and comfortable; well developed and well nourished. Alopecia SKIN: skin color, texture, turgor are normal, no rashes or significant lesions; R portacath. EYES: normal, Conjunctiva are pink and non-injected, sclera clear OROPHARYNX:no exudate, no erythema and lips, buccal mucosa, and tongue normal  NECK: supple, thyroid normal size, non-tender, without nodularity LYMPH:  no palpable lymphadenopathy in the cervical; small less than  a cm adenopathy in the R supraclavicular area. Decreased adenopathy (less than 0.5 cm) in the L axillary. LUNGS: rhonchi noted in upper fields with normal breathing effort, no wheezes  HEART: regular rate & rhythm and no murmurs and no lower extremity edema ABDOMEN:abdomen soft, non-tender and normal bowel sounds Musculoskeletal:no cyanosis of digits and no clubbing  NEURO: alert & oriented x 3 with fluent speech, no focal motor/sensory deficits  Labs:  Lab Results  Component Value Date   WBC 5.3 07/28/2013   HGB 11.6* 07/28/2013   HCT 34.7* 07/28/2013   MCV 88.7 07/28/2013   PLT 435* 07/28/2013   NEUTROABS 1.9 07/28/2013      Chemistry      Component Value Date/Time   NA 136 07/28/2013 0910   NA 137 02/05/2013 0528   K 4.6 07/28/2013 0910   K 4.1 02/05/2013 0528   CL 98 02/05/2013 0528   CO2 25 07/28/2013 0910   CO2 28 02/05/2013 0528   BUN 11.5 07/28/2013 0910   BUN 14 02/05/2013 0528   CREATININE 1.0 07/28/2013 0910   CREATININE 0.99 02/05/2013 0528      Component Value Date/Time   CALCIUM 9.8 07/28/2013 0910   CALCIUM 9.0 02/05/2013 0528   ALKPHOS 70 07/28/2013 0910   ALKPHOS 97 02/05/2013 0528   AST 27 07/28/2013 0910   AST 24 02/05/2013 0528   ALT 28 07/28/2013 0910   ALT 12 02/05/2013 0528   BILITOT 0.28 07/28/2013 0910   BILITOT 0.3 02/05/2013 0528       Basic Metabolic Panel:  Recent Labs Lab 07/28/13 0910  NA 136  K 4.6  CO2 25  GLUCOSE 193*  BUN 11.5  CREATININE 1.0  CALCIUM 9.8   GFR Estimated Creatinine Clearance: 107.2 ml/min (by C-G formula based on Cr of 1). Liver Function Tests:  Recent Labs Lab 07/28/13 0910  AST 27  ALT 28  ALKPHOS 70  BILITOT 0.28  PROT 7.1  ALBUMIN 3.3*   No results found for this basename: LIPASE, AMYLASE,  in the last 168 hours No results found for this basename: AMMONIA,  in the last 168 hours Coagulation profile No results found for this basename: INR, PROTIME,  in the last 168 hours  CBC:  Recent Labs Lab  07/28/13 0909  WBC 5.3  NEUTROABS 1.9  HGB 11.6*  HCT 34.7*  MCV 88.7  PLT 435*    Anemia work up No results found for this basename: VITAMINB12, FOLATE, FERRITIN, TIBC, IRON, RETICCTPCT,  in the last 72 hours  Studies: (Personally reviewed by me) Nm Pet Image Restag (ps) Skull Base To Thigh  07/28/2013   CLINICAL DATA:  Subsequent treatment strategy for lymphoma. Hodgkin's lymphoma with mixed cellularity.  EXAM: NUCLEAR MEDICINE PET SKULL BASE TO THIGH  TECHNIQUE: 10.3 mCi F-18 FDG was injected intravenously. Full-ring PET imaging was performed from the skull base to thigh after the radiotracer. CT data was obtained and used for attenuation correction and anatomic localization.  FASTING BLOOD GLUCOSE:  Value: 188 mg/dl  COMPARISON:  PET-CT 04/06/2013  FINDINGS: NECK  Small left level III lymph nodes beneath the sternocleidomastoid muscle are again demonstrated. These lymph nodes measure less than 1 cm short axis (image 74, series 5 and image 77). The lymph nodes in decreased slightly from just above 1 cm on comparison exam. Lymph nodes if also decreased in metabolic activity with SUV max = 1.2 which is less than the blood pool.  CHEST  Mildly enlarged left axillary lymph nodes also metabolic activity less than blood pool.  There are new bilateral patchy round foci of apparent airspace disease within the left and right upper lobe. Example lesion in the left upper lobe measures 2.7 cm with SUV max 4.2. Similar lesion in the right lower lobe right lower lobe measures 2.7 cm (image 43) with SUV max 3.9. There are multiple lesions scattered throughout the left and right lung.  ABDOMEN/PELVIS  There are mild a hypermetabolic left periaortic lymph nodes which are decreased metabolic activity. For example which peritoneal lymph node left of the aorta measures 16 mm decreased from 18 mm on prior with SUV max equal 2.2 compared to 2.6 on prior. No abnormal metabolic activity associated spleen. Right external  iliac lymph node is also present.  SKELETON  No hypermetabolic activity associated with apparent space.  IMPRESSION: 1. Continued positive response to chemotherapy with decrease in metabolic activity of left cervical lymph nodes, right axial lymph nodes, and retroperitoneal lymph nodes. Metabolic activity is less than or equal to blood pool ( Deauville 2). 2. New hypermetabolic nodular airspace disease within the left and right upper lobes. This is concerning for a a drug reaction versus multifocal pneumonia. Cannot exclude lymphoma although this is less is favor in light of the positive response lymph nodes. Findings conveyed toDAVID CHISM on 07/28/2013  at11:48.   Electronically Signed   By: Suzy Bouchard M.D.   On: 07/28/2013 11:48     RADIOGRAPHIC STUDIES: None  ASSESSMENT: [name] [name] 48 y.o. male with a history of Hodgkin's disease with mixed cellularity - Plan: Ambulatory referral to Pulmonology, Pulmonary function test,  CBC with Differential, Comprehensive metabolic panel (Cmet) - [facility], Lactate dehydrogenase (LDH) - [facility]   PLAN:  1. Classical Hodgkin's Disease with mixed cellularity, c/w Stage IIIB (if pulmonary nodules which are less than 1cm are negative). Bone marrow is negative for lymphoma. IPS for Hodgkin's disease (3 points for male, age greater than 77, albumin less than 4; 60% 5 year FFP, 78% 5-year overall survival based on Haseelever D, NEJM, 1998).   --We will continue chemotherapy with ABVD x 6 cycles (category 2A) q28 days (doxorubicin, bleomycin, vinblastine, and dacarbazine) (Duggan DB, JCO, 2003). We had a detailed discussion regarding the benefits of including approximately 80 percent of patients with advanced stage HL will attain a complete response after treatment with ABVD. Up to one-quarter of patients will have disease progression requiring further therapy; half of those will have long term survival with autologous hematopoietic cell transplantation. Overall  survival rates at 4, 7, and 10 years are approximately 90, 75, and 55 percent, respectively.  --He completed 6 cycles of ABVD as noted above.  We obtained an restaging PET-CT following Cycle #2 (NCCN Guidelines, Version 2.2014, HODG-9) to assess response and further treatment based on deaville criteria for response. Deaville criteria was 2 so therefore we will continue with this ABVD therapy.  Repeat PET following 6 cycles of ABVD on 07/28/2013 demonstrated Deaville criteria 2 as well.   2. New nodular airspace disease plus cough. --Patient denies fevers or new medications.  Based on upper lobe bilateral opacities, we will refer to pulmonary. We will repeat PFTs prior to this evaluation.    3. Left Hepatic artery aneurysm S/p embolization by IR on 02-04-2013.  --He had follow-up with Dr. Geroge Baseman s/p coil embolization on 02/04/2013.  His MRI of brain and CTA was as noted above.  He will continue annual follow up with Dr. Geroge Baseman. His liver enzymes remain normal limits. He was counseled to report any symptoms of abdominal pain and avoid any sports or high energy exercises. He voiced understanding and agreement with this plan.   4. Heterogeneous thyroid with a in 9 mm isthmus lesion.  --Consider further evaluation with thyroid ultrasound. If patient is clinically hyperthyroid, consider nuclear medicine thyroid uptake and scan. TSH was 9.659 on 05/01/3788.   5. Diastolic Dysfunction, Grade I.  --Echocardiogram as noted above. Patient has a history of DM2, Hypertension. Repeat Echocardiogram following cycle #4 obtained and reviewed with the patient today.  It is negative for diastolic dysfunction and a normal EF of 65%.    6. Family History of cancer, cancer prior to age of 25.  --Patient has Jewish ancestry and reports father was also diagnosed with hodgkin's disease. He declined BRCA testing.  He was seen by genetic counselor on 03/28/2013.   7. DM2.  --Continue Metformin 500 mg daily per  PCP/endo.   8. Hypertension/HLD/CAD.  --Continue lisinopril 10 mg daily.  --Continue hydrochlorothiazide 50 mg daily with KCL 10 mEQ daily.  --Continue Lovastatin 500 mg daily.   9. Hypothyroidism  --Continue levothyroxine 200 mg daily per endo. Checked TSH, T4. TSH slightly high. Thyroid ultrasound as needed. (see #4)   10. Obesity  --Continue dieting and weight lost.   11. Follow-up.  --Patient will require follow-up in 4 weeks with chemistries including LDH, CBC and CMP. A referral was made to pulmonary and repeat PFTs ordered.   All questions were answered. The patient knows to call the clinic with any problems, questions or concerns. We can certainly see the patient much sooner if  necessary.  I spent 15 minutes counseling the patient face to face. The total time spent in the appointment was 25 minutes.    CHISM, DAVID, MD 07/29/2013 2:28 PM

## 2013-07-30 ENCOUNTER — Other Ambulatory Visit: Payer: Self-pay | Admitting: Internal Medicine

## 2013-07-30 ENCOUNTER — Telehealth: Payer: Self-pay | Admitting: Internal Medicine

## 2013-07-30 NOTE — Telephone Encounter (Signed)
Pt confirmed labs/flush/ov per 07/17 POF, gave pt AVS and advised would call of referral apt...Marland KitchenMarland KitchenKJ

## 2013-08-04 ENCOUNTER — Ambulatory Visit (HOSPITAL_COMMUNITY)
Admission: RE | Admit: 2013-08-04 | Discharge: 2013-08-04 | Disposition: A | Discharge status: Home / Self Care | Payer: PRIVATE HEALTH INSURANCE | Source: Ambulatory Visit | Attending: Internal Medicine | Admitting: Internal Medicine

## 2013-08-04 DIAGNOSIS — R059 Cough, unspecified: Secondary | ICD-10-CM | POA: Insufficient documentation

## 2013-08-04 DIAGNOSIS — C8190 Hodgkin lymphoma, unspecified, unspecified site: Secondary | ICD-10-CM | POA: Insufficient documentation

## 2013-08-04 DIAGNOSIS — R0609 Other forms of dyspnea: Secondary | ICD-10-CM | POA: Insufficient documentation

## 2013-08-04 DIAGNOSIS — R0989 Other specified symptoms and signs involving the circulatory and respiratory systems: Principal | ICD-10-CM | POA: Insufficient documentation

## 2013-08-04 DIAGNOSIS — Z87891 Personal history of nicotine dependence: Secondary | ICD-10-CM | POA: Insufficient documentation

## 2013-08-04 DIAGNOSIS — C8120 Mixed cellularity classical Hodgkin lymphoma, unspecified site: Secondary | ICD-10-CM

## 2013-08-04 DIAGNOSIS — R05 Cough: Secondary | ICD-10-CM | POA: Insufficient documentation

## 2013-08-04 MED ORDER — ALBUTEROL SULFATE (2.5 MG/3ML) 0.083% IN NEBU
2.5000 mg | INHALATION_SOLUTION | Freq: Once | RESPIRATORY_TRACT | Status: AC
  Administered 2013-08-04: 2.5 mg via RESPIRATORY_TRACT

## 2013-08-07 LAB — PULMONARY FUNCTION TEST
DL/VA % pred: 80 %
DL/VA: 3.68 ml/min/mmHg/L
DLCO COR % PRED: 57 %
DLCO cor: 17.95 ml/min/mmHg
DLCO unc % pred: 52 %
DLCO unc: 16.22 ml/min/mmHg
FEF 25-75 PRE: 3.23 L/s
FEF 25-75 Post: 3.22 L/sec
FEF2575-%CHANGE-POST: 0 %
FEF2575-%Pred-Post: 91 %
FEF2575-%Pred-Pre: 91 %
FEV1-%Change-Post: 0 %
FEV1-%PRED-POST: 67 %
FEV1-%Pred-Pre: 67 %
FEV1-POST: 2.62 L
FEV1-PRE: 2.63 L
FEV1FVC-%CHANGE-POST: 0 %
FEV1FVC-%Pred-Pre: 109 %
FEV6-%Change-Post: -1 %
FEV6-%Pred-Post: 63 %
FEV6-%Pred-Pre: 64 %
FEV6-PRE: 3.08 L
FEV6-Post: 3.03 L
FEV6FVC-%Change-Post: -1 %
FEV6FVC-%PRED-PRE: 103 %
FEV6FVC-%Pred-Post: 101 %
FVC-%Change-Post: 0 %
FVC-%Pred-Post: 62 %
FVC-%Pred-Pre: 62 %
FVC-POST: 3.09 L
FVC-Pre: 3.08 L
POST FEV6/FVC RATIO: 98 %
PRE FEV6/FVC RATIO: 100 %
Post FEV1/FVC ratio: 85 %
Pre FEV1/FVC ratio: 85 %
RV % pred: 62 %
RV: 1.21 L
TLC % PRED: 64 %
TLC: 4.37 L

## 2013-08-08 ENCOUNTER — Telehealth: Payer: Self-pay | Admitting: Internal Medicine

## 2013-08-08 ENCOUNTER — Other Ambulatory Visit: Payer: Self-pay | Admitting: Internal Medicine

## 2013-08-08 DIAGNOSIS — T451X5A Adverse effect of antineoplastic and immunosuppressive drugs, initial encounter: Principal | ICD-10-CM

## 2013-08-08 DIAGNOSIS — J984 Other disorders of lung: Secondary | ICD-10-CM

## 2013-08-08 NOTE — Telephone Encounter (Signed)
S.w. Pt and advised on 8.18 appt with Dr. Wilnette Kales @ 3pm

## 2013-08-17 ENCOUNTER — Encounter: Payer: Self-pay | Admitting: *Deleted

## 2013-08-26 ENCOUNTER — Ambulatory Visit: Payer: PRIVATE HEALTH INSURANCE

## 2013-08-26 ENCOUNTER — Other Ambulatory Visit: Payer: PRIVATE HEALTH INSURANCE

## 2013-08-26 ENCOUNTER — Other Ambulatory Visit: Payer: Self-pay

## 2013-08-29 ENCOUNTER — Telehealth: Payer: Self-pay | Admitting: Internal Medicine

## 2013-08-29 NOTE — Telephone Encounter (Signed)
Lvm for pt to call to r/s 8/14 missed appt.

## 2013-08-30 ENCOUNTER — Telehealth: Payer: Self-pay | Admitting: Internal Medicine

## 2013-08-30 ENCOUNTER — Institutional Professional Consult (permissible substitution): Payer: PRIVATE HEALTH INSURANCE | Admitting: Pulmonary Disease

## 2013-08-30 NOTE — Telephone Encounter (Signed)
returned pt call and r/s missed appt....pt ok and aware of appt d.t

## 2013-08-31 ENCOUNTER — Other Ambulatory Visit (HOSPITAL_BASED_OUTPATIENT_CLINIC_OR_DEPARTMENT_OTHER): Payer: PRIVATE HEALTH INSURANCE

## 2013-08-31 ENCOUNTER — Ambulatory Visit (HOSPITAL_BASED_OUTPATIENT_CLINIC_OR_DEPARTMENT_OTHER): Payer: PRIVATE HEALTH INSURANCE | Admitting: Internal Medicine

## 2013-08-31 ENCOUNTER — Telehealth: Payer: Self-pay | Admitting: Internal Medicine

## 2013-08-31 VITALS — BP 118/75 | HR 97 | Temp 99.6°F | Resp 19 | Ht 70.0 in | Wt 216.2 lb

## 2013-08-31 DIAGNOSIS — T451X5A Adverse effect of antineoplastic and immunosuppressive drugs, initial encounter: Secondary | ICD-10-CM

## 2013-08-31 DIAGNOSIS — E039 Hypothyroidism, unspecified: Secondary | ICD-10-CM

## 2013-08-31 DIAGNOSIS — D4959 Neoplasm of unspecified behavior of other genitourinary organ: Secondary | ICD-10-CM

## 2013-08-31 DIAGNOSIS — C8121 Mixed cellularity classical Hodgkin lymphoma, lymph nodes of head, face, and neck: Secondary | ICD-10-CM

## 2013-08-31 DIAGNOSIS — E669 Obesity, unspecified: Secondary | ICD-10-CM

## 2013-08-31 DIAGNOSIS — I428 Other cardiomyopathies: Secondary | ICD-10-CM

## 2013-08-31 DIAGNOSIS — I427 Cardiomyopathy due to drug and external agent: Secondary | ICD-10-CM

## 2013-08-31 DIAGNOSIS — I10 Essential (primary) hypertension: Secondary | ICD-10-CM

## 2013-08-31 DIAGNOSIS — E119 Type 2 diabetes mellitus without complications: Secondary | ICD-10-CM

## 2013-08-31 DIAGNOSIS — C8120 Mixed cellularity classical Hodgkin lymphoma, unspecified site: Secondary | ICD-10-CM

## 2013-08-31 DIAGNOSIS — I728 Aneurysm of other specified arteries: Secondary | ICD-10-CM

## 2013-08-31 DIAGNOSIS — I5189 Other ill-defined heart diseases: Secondary | ICD-10-CM

## 2013-08-31 LAB — COMPREHENSIVE METABOLIC PANEL (CC13)
ALBUMIN: 3.4 g/dL — AB (ref 3.5–5.0)
ALT: 34 U/L (ref 0–55)
AST: 38 U/L — ABNORMAL HIGH (ref 5–34)
Alkaline Phosphatase: 58 U/L (ref 40–150)
Anion Gap: 8 mEq/L (ref 3–11)
BUN: 11 mg/dL (ref 7.0–26.0)
CALCIUM: 9.6 mg/dL (ref 8.4–10.4)
CHLORIDE: 104 meq/L (ref 98–109)
CO2: 23 meq/L (ref 22–29)
Creatinine: 0.9 mg/dL (ref 0.7–1.3)
Glucose: 211 mg/dl — ABNORMAL HIGH (ref 70–140)
Potassium: 4.4 mEq/L (ref 3.5–5.1)
SODIUM: 135 meq/L — AB (ref 136–145)
TOTAL PROTEIN: 7.1 g/dL (ref 6.4–8.3)
Total Bilirubin: 0.29 mg/dL (ref 0.20–1.20)

## 2013-08-31 LAB — CBC WITH DIFFERENTIAL/PLATELET
BASO%: 0.6 % (ref 0.0–2.0)
Basophils Absolute: 0.1 10*3/uL (ref 0.0–0.1)
EOS ABS: 0.4 10*3/uL (ref 0.0–0.5)
EOS%: 4.4 % (ref 0.0–7.0)
HEMATOCRIT: 37.4 % — AB (ref 38.4–49.9)
HGB: 12.7 g/dL — ABNORMAL LOW (ref 13.0–17.1)
LYMPH%: 38.1 % (ref 14.0–49.0)
MCH: 30 pg (ref 27.2–33.4)
MCHC: 33.8 g/dL (ref 32.0–36.0)
MCV: 88.6 fL (ref 79.3–98.0)
MONO#: 0.7 10*3/uL (ref 0.1–0.9)
MONO%: 7.9 % (ref 0.0–14.0)
NEUT%: 49 % (ref 39.0–75.0)
NEUTROS ABS: 4.4 10*3/uL (ref 1.5–6.5)
Platelets: 357 10*3/uL (ref 140–400)
RBC: 4.23 10*6/uL (ref 4.20–5.82)
RDW: 15.3 % — ABNORMAL HIGH (ref 11.0–14.6)
WBC: 8.9 10*3/uL (ref 4.0–10.3)
lymph#: 3.4 10*3/uL — ABNORMAL HIGH (ref 0.9–3.3)

## 2013-08-31 LAB — LACTATE DEHYDROGENASE (CC13): LDH: 207 U/L (ref 125–245)

## 2013-08-31 NOTE — Progress Notes (Signed)
[place] OFFICE PROGRESS NOTE  Pcp Not In System No address on file  DIAGNOSIS: Hodgkin's disease with mixed cellularity - Plan: CBC with Differential, Basic metabolic panel (Bmet) - [facility], Lactate dehydrogenase (LDH) - [facility]  Diastolic dysfunction - Plan: CBC with Differential, Basic metabolic panel (Bmet) - [facility], Lactate dehydrogenase (LDH) - [facility]  Chemotherapy-induced cardiomyopathy - Plan: CBC with Differential, Basic metabolic panel (Bmet) - [facility], Lactate dehydrogenase (LDH) - [facility]  Chief Complaint  Patient presents with  . Follow-up    PRIOR TREATMENT: ABVD q 2 weeks started on 02/11/2013. He completed completed on 07/14/2013.   CURRENT TREATMENT: Active surveillance.     Hodgkin's disease with mixed cellularity   12/03/2012 Imaging CT Neck: Lymphadenopathy left cervical chain with right-sided axillary LAD.  Heterogenous thyroid with an 9 mm isthmus lesion.     12/29/2012 Imaging CXR: No bulky LAD. No active cardiopulmonary disease.    12/31/2012 Pathology Results Classical Hodgkin's disease of mixed cellularity.    12/31/2012 Procedure Excisional biopsy of left cervical/supraclavicular LAD.    01/14/2013 Initial Diagnosis Hodgkin's disease with mixed cellularity, Stage IIIB. (night sweats).  ESR greater than 50. LDH of 297.  IPS, 3 points for male, age greater than 30, albumin less than 4; 60% 5 year FFP, 78% 5-year OS Haseelever D, [id number].    01/18/2013 Imaging Echo: LV EF 50-55%.  Abnormal L ventricular relaxation (grade 1 diastolic dysfunction). Referred to cardiology Rowland Lathe) with recommendations to repeat Echo s/p 4 cycles.    01/24/2013 Cancer Staging PET/CT: Hypermetabolic LAD identified w/in the neck, chest, abdomen, and pelvis.  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  CT C/A/P:  2 nonspecific pulmonary nodules LLL, 3 mm and RLL 4 mm.    01/25/2013 Bone Marrow Biopsy Bone marrow without lymphoma involvement.  Normal cytogenetics.  Normocellular for age with trileage hematopoiesis without tumor identifed.    02/03/2013 Procedure R port-a-cath placment.    02/04/2013 Procedure S/p successful complex coil, plug and liquid embolization of left hepatic artery aneurysm by IR (Dr. Karin Golden)   02/11/2013 -  Chemotherapy Started ABVD q 28 days (Adriamycin 25 mg/m2, vinblastine 6 mg/m2, bleomycin 10 units/m2, dacarbazine 275 mg/m2.   based on Duggan DB, JCO, 2003. He is scheduled for cycle #5 on 06/03/2013.   02/11/2013 Family History Referral to genetics.  Jewish acenstry with hodgkin's disease in father; mother died of an abdominal aneurysm.    04/06/2013 Imaging PET/CT: Significantly improved thoracic, retroperitoneal, and pelvic lymphadenopathy, as described above, max SUV 2.9. Deauville score 2.      07/28/2013 Imaging PET consistent with CR (Deaville 2).  However, new nodular airspace disease was noted. Referred to pulmonary and repeat PFTs ordered.    INTERVAL HISTORY: [name] [name] 48 y.o. male with a history of classical hodgkin's disease of mixed cellularity, stage III b is here for one month follow-up.  He was last seen by me on 07/29/2013. He continues to deny fevers or chills or further night sweats or weight loss.  He denies any breathing problems.  He does report a dry cough.  He reports that his breathing is cbetter and that he forgot his pulmonary appointment with Dr. Curt Jews.  He reports good energy and appetite.    MEDICAL HISTORY: Past Medical History  Diagnosis Date  . Hypertension   . Diabetes mellitus without complication     Pre-diabetes  . Depression   . Headache(784.0)   . Thyroid disease     Hx: of  .  Cancer 2015    cervical lymphadenopathy  . Hodgkin lymphoma 2015  . Hepatic artery aneurysm 2015    INTERIM HISTORY: has Cervical lymphadenopathy; Hodgkin's disease with mixed cellularity; DM (diabetes mellitus); Hyperlipemia; Obesity; Hypothyroidism; Aneurysm artery, hepatic; Family history of  cancer; Diastolic dysfunction; Thyroid lesion; Essential hypertension; At risk for coronary artery disease; and Chemotherapy-induced cardiomyopathy on his problem list.    ALLERGIES:  has No Known Allergies.  MEDICATIONS: has a current medication list which includes the following prescription(s): carvedilol, dexamethasone, eszopiclone, fenofibrate, fexofenadine, levothyroxine, lidocaine-prilocaine, lisinopril, lovastatin, metformin, montelukast, naproxen sodium, ondansetron, oxycodone, potassium chloride, prochlorperazine, sertraline, and lorazepam.  SURGICAL HISTORY:  Past Surgical History  Procedure Laterality Date  . Tonsillectomy    . Mass excision Left 12/31/2012    Procedure: CERVICAL LYMPH NODE BIOPSY;  Surgeon: [name], MD;  Location: [facility];  Service: General;  Laterality: Left;    REVIEW OF SYSTEMS:   Constitutional: Denies fevers, chills or abnormal weight loss Eyes: Denies blurriness of vision Ears, nose, mouth, throat, and face: Denies mucositis or sore throat Respiratory: Denies cough, dyspnea or wheezes Cardiovascular: Denies palpitation, chest discomfort or lower extremity swelling Gastrointestinal:  Denies nausea, heartburn or change in bowel habits Skin: Denies abnormal skin rashes Lymphatics: Denies new lymphadenopathy or easy bruising Neurological:Denies numbness, tingling or new weaknesses Behavioral/Psych: Mood is stable, no new changes  All other systems were reviewed with the patient and are negative.  PHYSICAL EXAMINATION: ECOG PERFORMANCE STATUS: 0 - Asymptomatic  Blood pressure 118/75, pulse 97, temperature 99.6 F (37.6 C), temperature source Oral, resp. rate 19, height _0  (1.778 m), weight 216 lb 3.2 oz (98.068 kg), SpO2 96.00%. Oxygen 96% of R.A.   GENERAL:alert, no distress and comfortable; well developed and well nourished. Alopecia SKIN: skin color, texture, turgor are normal, no rashes or significant lesions; R portacath. EYES: normal,  Conjunctiva are pink and non-injected, sclera clear OROPHARYNX:no exudate, no erythema and lips, buccal mucosa, and tongue normal  NECK: supple, thyroid normal size, non-tender, without nodularity LYMPH:  no palpable lymphadenopathy in the cervical; small less than a cm adenopathy in the R supraclavicular area. Decreased adenopathy (less than 0.5 cm) in the L axillary. LUNGS: rhonchi noted in upper fields with normal breathing effort, no wheezes  HEART: regular rate & rhythm and no murmurs and no lower extremity edema ABDOMEN:abdomen soft, non-tender and normal bowel sounds Musculoskeletal:no cyanosis of digits and no clubbing  NEURO: alert & oriented x 3 with fluent speech, no focal motor/sensory deficits  Labs:  Lab Results  Component Value Date   WBC 8.9 08/31/2013   HGB 12.7* 08/31/2013   HCT 37.4* 08/31/2013   MCV 88.6 08/31/2013   PLT 357 08/31/2013   NEUTROABS 4.4 08/31/2013      Chemistry      Component Value Date/Time   NA 135* 08/31/2013 1434   NA 137 02/05/2013 0528   K 4.4 08/31/2013 1434   K 4.1 02/05/2013 0528   CL 98 02/05/2013 0528   CO2 23 08/31/2013 1434   CO2 28 02/05/2013 0528   BUN 11.0 08/31/2013 1434   BUN 14 02/05/2013 0528   CREATININE 0.9 08/31/2013 1434   CREATININE 0.99 02/05/2013 0528      Component Value Date/Time   CALCIUM 9.6 08/31/2013 1434   CALCIUM 9.0 02/05/2013 0528   ALKPHOS 58 08/31/2013 1434   ALKPHOS 97 02/05/2013 0528   AST 38* 08/31/2013 1434   AST 24 02/05/2013 0528   ALT 34 08/31/2013 1434  ALT 12 02/05/2013 0528   BILITOT 0.29 08/31/2013 1434   BILITOT 0.3 02/05/2013 0528       Basic Metabolic Panel:  Recent Labs Lab 08/31/13 1434  NA 135*  K 4.4  CO2 23  GLUCOSE 211*  BUN 11.0  CREATININE 0.9  CALCIUM 9.6   GFR Estimated Creatinine Clearance: 119.1 ml/min (by C-G formula based on Cr of 0.9). Liver Function Tests:  Recent Labs Lab 08/31/13 1434  AST 38*  ALT 34  ALKPHOS 58  BILITOT 0.29  PROT 7.1  ALBUMIN 3.4*   No  results found for this basename: LIPASE, AMYLASE,  in the last 168 hours No results found for this basename: AMMONIA,  in the last 168 hours Coagulation profile No results found for this basename: INR, PROTIME,  in the last 168 hours  CBC:  Recent Labs Lab 08/31/13 1434  WBC 8.9  NEUTROABS 4.4  HGB 12.7*  HCT 37.4*  MCV 88.6  PLT 357    Anemia work up No results found for this basename: VITAMINB12, FOLATE, FERRITIN, TIBC, IRON, RETICCTPCT,  in the last 72 hours  Studies: (Personally reviewed by me) No results found.   RADIOGRAPHIC STUDIES: None  ASSESSMENT: Mady Gemma 48 y.o. male with a history of Hodgkin's disease with mixed cellularity - Plan: CBC with Differential, Basic metabolic panel (Bmet) - [facility], Lactate dehydrogenase (LDH) - [facility]  Diastolic dysfunction - Plan: CBC with Differential, Basic metabolic panel (Bmet) - [facility], Lactate dehydrogenase (LDH) - [facility]  Chemotherapy-induced cardiomyopathy - Plan: CBC with Differential, Basic metabolic panel (Bmet) - [facility], Lactate dehydrogenase (LDH) - [facility]   PLAN:  1. Classical Hodgkin's Disease with mixed cellularity, c/w Stage IIIB (if pulmonary nodules which are less than 1cm are negative). Bone marrow is negative for lymphoma. IPS for Hodgkin's disease (3 points for male, age greater than 48, albumin less than 4; 60% 5 year FFP, 78% 5-year overall survival based on Haseelever D, NEJM, 1998).   --We finished chemotherapy with ABVD x 6 cycles (category 2A) q28 days (doxorubicin, bleomycin, vinblastine, and dacarbazine) (Duggan DB, JCO, 2003). We had a detailed discussion regarding the benefits of including approximately 80 percent of patients with advanced stage HL will attain a complete response after treatment with ABVD. Up to one-quarter of patients will have disease progression requiring further therapy; half of those will have long term survival with autologous hematopoietic cell transplantation. Overall survival rates at  4, 7, and 10 years are approximately 90, 75, and 55 percent, respectively.  --He completed 6 cycles of ABVD as noted above.  We obtained an restaging PET-CT following Cycle #2 (NCCN Guidelines, Version 2.2014, HODG-9) to assess response and further treatment based on deaville criteria for response. Deaville criteria was 2 so therefore we will continue with this ABVD therapy.  Repeat PET following 6 cycles of ABVD on 07/28/2013 demonstrated Deaville criteria 2 as well.   2. Nodular airspace disease plus cough concerning for bleomycin lung injury. --Patient denies fevers or new medications.  Based on upper lobe bilateral opacities, we referred to pulmonary. PFTs were repeated demonstrating moderate restriction.  Patient missed his appointment yesterday due a visit with his brother.  We will reschedule this appointment with Dr. Curt Jews.   3. Left Hepatic artery aneurysm S/p embolization by IR on 02-04-2013.  --He had follow-up with Dr. Geroge Baseman s/p coil embolization on 02/04/2013.  His MRI of brain and CTA was as noted above.  He will continue annual follow up with Dr. Geroge Baseman. His liver  enzymes remain normal limits. He was counseled to report any symptoms of abdominal pain and avoid any sports or high energy exercises. He voiced understanding and agreement with this plan.   4. Heterogeneous thyroid with a in 9 mm isthmus lesion.  --Consider further evaluation with thyroid ultrasound. If patient is clinically hyperthyroid, consider nuclear medicine thyroid uptake and scan. TSH was 9.659 on 1/74/7159.   5. Diastolic Dysfunction, Grade I.  --Echocardiogram as noted above. Patient has a history of DM2, Hypertension. Repeat Echocardiogram following cycle #4 obtained and reviewed with the patient today.  It is negative for diastolic dysfunction and a normal EF of 65%.    6. Family History of cancer, cancer prior to age of 31.  --Patient has Jewish ancestry and reports father was also diagnosed with  hodgkin's disease. He declined BRCA testing.  He was seen by genetic counselor on 03/28/2013.   7. DM2.  --Continue Metformin 500 mg daily per PCP/endo.   8. Hypertension/HLD/CAD.  --Continue lisinopril 10 mg daily.  --Continue hydrochlorothiazide 50 mg daily with KCL 10 mEQ daily.  --Continue Lovastatin 500 mg daily.   9. Hypothyroidism  --Continue levothyroxine 200 mg daily per endo. Checked TSH, T4. TSH slightly high. Thyroid ultrasound as needed. (see #4)   10. Obesity  --Continue dieting and weight lost.   11. Follow-up.  --Patient will require follow-up in 4 weeks with chemistries including LDH, CBC and CMP. A referral was made to pulmonary last visit and he will call to reschedule his appointment.  All questions were answered. The patient knows to call the clinic with any problems, questions or concerns. We can certainly see the patient much sooner if necessary.  I spent 15 minutes counseling the patient face to face. The total time spent in the appointment was 25 minutes.    Davarion Cuffee, MD 08/31/2013 10:23 PM

## 2013-08-31 NOTE — Telephone Encounter (Signed)
gv and pritned appt scheda nd avs for pt for Aug thru Dec

## 2013-09-08 ENCOUNTER — Ambulatory Visit (HOSPITAL_BASED_OUTPATIENT_CLINIC_OR_DEPARTMENT_OTHER): Payer: PRIVATE HEALTH INSURANCE

## 2013-09-08 VITALS — BP 117/75 | HR 83 | Temp 97.6°F

## 2013-09-08 DIAGNOSIS — C8129 Mixed cellularity classical Hodgkin lymphoma, extranodal and solid organ sites: Secondary | ICD-10-CM

## 2013-09-08 DIAGNOSIS — Z452 Encounter for adjustment and management of vascular access device: Secondary | ICD-10-CM

## 2013-09-08 DIAGNOSIS — Z95828 Presence of other vascular implants and grafts: Secondary | ICD-10-CM

## 2013-09-08 MED ORDER — SODIUM CHLORIDE 0.9 % IJ SOLN
10.0000 mL | INTRAMUSCULAR | Status: DC | PRN
  Administered 2013-09-08: 10 mL via INTRAVENOUS
  Filled 2013-09-08: qty 10

## 2013-09-08 MED ORDER — HEPARIN SOD (PORK) LOCK FLUSH 100 UNIT/ML IV SOLN
500.0000 [IU] | Freq: Once | INTRAVENOUS | Status: AC
Start: 2013-09-08 — End: 2013-09-08
  Administered 2013-09-08: 500 [IU] via INTRAVENOUS
  Filled 2013-09-08: qty 5

## 2013-09-08 NOTE — Patient Instructions (Signed)

## 2013-09-13 ENCOUNTER — Ambulatory Visit (INDEPENDENT_AMBULATORY_CARE_PROVIDER_SITE_OTHER): Payer: PRIVATE HEALTH INSURANCE | Admitting: Pulmonary Disease

## 2013-09-13 ENCOUNTER — Encounter: Payer: Self-pay | Admitting: Pulmonary Disease

## 2013-09-13 ENCOUNTER — Ambulatory Visit (INDEPENDENT_AMBULATORY_CARE_PROVIDER_SITE_OTHER)
Admission: RE | Admit: 2013-09-13 | Discharge: 2013-09-13 | Disposition: A | Discharge status: Home / Self Care | Payer: PRIVATE HEALTH INSURANCE | Source: Ambulatory Visit | Attending: Pulmonary Disease | Admitting: Pulmonary Disease

## 2013-09-13 VITALS — BP 134/78 | HR 82 | Ht 71.0 in | Wt 222.0 lb

## 2013-09-13 DIAGNOSIS — R9389 Abnormal findings on diagnostic imaging of other specified body structures: Secondary | ICD-10-CM | POA: Insufficient documentation

## 2013-09-13 DIAGNOSIS — R0602 Shortness of breath: Secondary | ICD-10-CM | POA: Insufficient documentation

## 2013-09-13 NOTE — Progress Notes (Signed)
Subjective:    Patient ID: [name] [name], male    DOB: 1965-07-30, 48 y.o.   MRN: [id number]  HPI  [name] [name] is here to see me becaue he had an abnormal PET CT after chemotherapy.  He was diagnosed with Hodkins' Lympoma in January in 2014.  After starting chemotherpay he developed a cough.  He thinks that it started approximately 4 months into the chemotherapy.  His first dose of chemotherapy was on 02/12/2013 and he thinks the cough started about 4 months later.  This persisted for weeks.  He has noticed worsening breathing after climbing a flight of stairs.  He has also noted a deep pain in his chest when he takes a deep breath. He describes a "mild ache" when he takes a deep breathing.  He thinks that the dyspnea and the cough started at the same time.  He never had a fever or infectious trouble during the chemotherapy. He only produced sputum twice.  The coughing has improved a lot but he still has dyspnea.   He smoked for 30 years on and off and he hasn't exercised much in the last four years.  He does try to walk around his office (about one mile) per day.  He is able to keep doing this lately without stopping despite the above named symptoms.  He says that he can walk on level ground without difficulty.    He saw his doctor last week and was told that he had an abnormal pulmonary exam.   He has a brother with asthma, but he has no personal history of lung problems.    Past Medical History  Diagnosis Date  . Hypertension   . Diabetes mellitus without complication     Pre-diabetes  . Depression   . Headache(784.0)   . Thyroid disease     Hx: of  . Cancer 2015    cervical lymphadenopathy  . Hodgkin lymphoma 2015  . Hepatic artery aneurysm 2015     Family History  Problem Relation Age of Onset  . Aneurysm Mother 98    brain  . Obesity Mother   . Heart disease Mother   . AAA (abdominal aortic aneurysm) Mother     dx in her 6s  . Hodgkin's lymphoma Father     dx in his  44s, and again in his 52s  . Diabetes Father   . Cancer Brother     NOS dx in his 58s  . Bone cancer Maternal Uncle 40    don't know if primary or metastises     History   Social History  . Marital Status: Divorced    Spouse Name: N/A    Number of Children: 0  . Years of Education: N/A   Occupational History  .     Social History Main Topics  . Smoking status: Former Smoker -- 1.00 packs/day for 31 years    Types: Cigarettes    Quit date: 06/07/2012  . Smokeless tobacco: Never Used  . Alcohol Use: No  . Drug Use: No  . Sexual Activity: Not on file   Other Topics Concern  . Not on file   Social History Narrative  . No narrative on file     No Known Allergies   Outpatient Prescriptions Prior to Visit  Medication Sig Dispense Refill  . carvedilol (COREG) 6.25 MG tablet Take 1 tablet (6.25 mg total) by mouth 2 (two) times daily.  60 tablet  11  . dexamethasone (DECADRON)  4 MG tablet Take 2 tablets (8 mg total) by mouth 2 (two) times daily with a meal. Take two times a day starting the day after chemotherapy for 3 days.  30 tablet  1  . eszopiclone (LUNESTA) 2 MG TABS tablet Take 2 mg by mouth at bedtime as needed for sleep. Take immediately before bedtime      . fenofibrate (TRICOR) 145 MG tablet Take 1 tablet (145 mg total) by mouth daily.  30 tablet  6  . fexofenadine (ALLEGRA) 180 MG tablet Take 180 mg by mouth daily.      Marland Kitchen levothyroxine (SYNTHROID, LEVOTHROID) 200 MCG tablet Take 200 mcg by mouth daily before breakfast.      . lidocaine-prilocaine (EMLA) cream Apply 1 application topically as needed. Apply to port site one hour before treatment and cover with plastic wrap.  30 g  1  . lisinopril (PRINIVIL,ZESTRIL) 10 MG tablet Take 10 mg by mouth daily with breakfast.       . LORazepam (ATIVAN) 0.5 MG tablet Take 1 tablet (0.5 mg total) by mouth every 6 (six) hours as needed (Nausea or vomiting).  30 tablet  0  . lovastatin (ALTOPREV) 40 MG 24 hr tablet Take 40 mg by  mouth at bedtime.      . metFORMIN (GLUCOPHAGE) 500 MG tablet Take 500 mg by mouth daily.      . montelukast (SINGULAIR) 10 MG tablet Take 10 mg by mouth at bedtime.      . naproxen sodium (ANAPROX) 220 MG tablet Take 220 mg by mouth daily as needed (pain).      . ondansetron (ZOFRAN) 8 MG tablet Take 1 tablet (8 mg total) by mouth 2 (two) times daily. Take two times a day starting the day after chemo for 3 days. Then take two times a day as needed for nausea or vomiting.  30 tablet  1  . oxyCODONE (OXY IR/ROXICODONE) 5 MG immediate release tablet Take 1 tablet (5 mg total) by mouth every 6 (six) hours as needed for severe pain.  60 tablet  0  . potassium chloride (K-DUR,KLOR-CON) 10 MEQ tablet Take 10 mEq by mouth daily.       . prochlorperazine (COMPAZINE) 10 MG tablet Take 1 tablet (10 mg total) by mouth every 6 (six) hours as needed (Nausea or vomiting).  30 tablet  1  . sertraline (ZOLOFT) 100 MG tablet Take 100 mg by mouth daily with breakfast.        No facility-administered medications prior to visit.      Review of Systems  Constitutional: Negative for fever and unexpected weight change.  HENT: Positive for sinus pressure. Negative for congestion, dental problem, ear pain, nosebleeds, postnasal drip, rhinorrhea, sneezing, sore throat and trouble swallowing.   Eyes: Negative for redness and itching.  Respiratory: Positive for cough, chest tightness and shortness of breath. Negative for wheezing.   Cardiovascular: Negative for palpitations and leg swelling.  Gastrointestinal: Negative for nausea and vomiting.  Genitourinary: Negative for dysuria.  Musculoskeletal: Negative for joint swelling.  Skin: Negative for rash.  Neurological: Negative for headaches.  Hematological: Does not bruise/bleed easily.  Psychiatric/Behavioral: Negative for dysphoric mood. The patient is not nervous/anxious.        Objective:   Physical Exam Filed Vitals:   09/13/13 1425  BP: 134/78  Pulse:  82  Height: 5\' 11"  (1.803 m)  Weight: 222 lb (100.699 kg)  SpO2: 94%   Gen: well appearing, no acute distress HEENT: NCAT, PERRL,  EOMi, OP clear, neck supple without masses PULM: CTA B CV: RRR, no mgr, no JVD AB: BS+, soft, nontender, no hsm Ext: warm, no edema, no clubbing, no cyanosis Derm: no rash or skin breakdown Neuro: A&Ox4, CN II-XII intact, strength 5/5 in all 4 extremities       Assessment & Plan:   Shortness of breath Today on physical exam he has some very mild crackles bilaterally, but his ambulatory oximetry was normal. Upon reviewing the objective evidence, in July 2015 he did have patchy airspace disease bilaterally in a nonspecific distribution. This is associated with restrictive lung disease as demonstrated by the July 2015 pulmonary function test. This was a market change from his normal CT scan and PFT which had been obtained in December 2015 prior to initiation of bleomycin.  Fortunately at this point his symptoms have nearly completely resolved. His shortness of breath has improved greatly and he is no longer struggling to climb stairs or walk a mile at work. He also says that his cough is nearly completely resolved.  Further, his chest x-ray from August 1 showed improvement compared to the 07/24/2013 CT scan. So at this point, generating a differential diagnosis on the 07/24/2013 CT scan findings would be pure speculation as I believe that these changes have resolved. At this point I do not think a bronchoscopy or biopsy would be of any yield. Perhaps he had a reaction to chemotherapy which is now resolved.  Plan: -The best approach at this point is observation alone -Obtain a chest x-ray today to see if there is any evidence of airspace disease -Followup with me in 2 months if at that point he has not had complete resolution of shortness of breath then we will obtain a repeat CT scan and pulmonary function testing.  Abnormal CT scan, chest It looks like these  changes are improving given his improved symptoms.  See discussion above.  I suspect he may have had a transient reaction to chemotherapy, but at this point I doubt a biopsy will add much.    Plan: -CXR today -f/u two months (as detailed above).   Updated Medication List Outpatient Encounter Prescriptions as of 09/13/2013  Medication Sig  . carvedilol (COREG) 6.25 MG tablet Take 1 tablet (6.25 mg total) by mouth 2 (two) times daily.  Marland Kitchen dexamethasone (DECADRON) 4 MG tablet Take 2 tablets (8 mg total) by mouth 2 (two) times daily with a meal. Take two times a day starting the day after chemotherapy for 3 days.  . eszopiclone (LUNESTA) 2 MG TABS tablet Take 2 mg by mouth at bedtime as needed for sleep. Take immediately before bedtime  . fenofibrate (TRICOR) 145 MG tablet Take 1 tablet (145 mg total) by mouth daily.  . fexofenadine (ALLEGRA) 180 MG tablet Take 180 mg by mouth daily.  Marland Kitchen levothyroxine (SYNTHROID, LEVOTHROID) 200 MCG tablet Take 200 mcg by mouth daily before breakfast.  . lidocaine-prilocaine (EMLA) cream Apply 1 application topically as needed. Apply to port site one hour before treatment and cover with plastic wrap.  . lisinopril (PRINIVIL,ZESTRIL) 10 MG tablet Take 10 mg by mouth daily with breakfast.   . LORazepam (ATIVAN) 0.5 MG tablet Take 1 tablet (0.5 mg total) by mouth every 6 (six) hours as needed (Nausea or vomiting).  Marland Kitchen lovastatin (ALTOPREV) 40 MG 24 hr tablet Take 40 mg by mouth at bedtime.  . metFORMIN (GLUCOPHAGE) 500 MG tablet Take 500 mg by mouth daily.  . montelukast (SINGULAIR) 10 MG tablet Take  10 mg by mouth at bedtime.  . naproxen sodium (ANAPROX) 220 MG tablet Take 220 mg by mouth daily as needed (pain).  . ondansetron (ZOFRAN) 8 MG tablet Take 1 tablet (8 mg total) by mouth 2 (two) times daily. Take two times a day starting the day after chemo for 3 days. Then take two times a day as needed for nausea or vomiting.  Marland Kitchen oxyCODONE (OXY IR/ROXICODONE) 5 MG immediate  release tablet Take 1 tablet (5 mg total) by mouth every 6 (six) hours as needed for severe pain.  . potassium chloride (K-DUR,KLOR-CON) 10 MEQ tablet Take 10 mEq by mouth daily.   . prochlorperazine (COMPAZINE) 10 MG tablet Take 1 tablet (10 mg total) by mouth every 6 (six) hours as needed (Nausea or vomiting).  . sertraline (ZOLOFT) 100 MG tablet Take 100 mg by mouth daily with breakfast.

## 2013-09-13 NOTE — Assessment & Plan Note (Signed)
It looks like these changes are improving given his improved symptoms.  See discussion above.  I suspect he may have had a transient reaction to chemotherapy, but at this point I doubt a biopsy will add much.    Plan: -CXR today -f/u two months (as detailed above).

## 2013-09-13 NOTE — Assessment & Plan Note (Signed)
Today on physical exam he has some very mild crackles bilaterally, but his ambulatory oximetry was normal. Upon reviewing the objective evidence, in July 2015 he did have patchy airspace disease bilaterally in a nonspecific distribution. This is associated with restrictive lung disease as demonstrated by the July 2015 pulmonary function test. This was a market change from his normal CT scan and PFT which had been obtained in December 2015 prior to initiation of bleomycin.  Fortunately at this point his symptoms have nearly completely resolved. His shortness of breath has improved greatly and he is no longer struggling to climb stairs or walk a mile at work. He also says that his cough is nearly completely resolved.  Further, his chest x-ray from August 1 showed improvement compared to the 07/24/2013 CT scan. So at this point, generating a differential diagnosis on the 07/24/2013 CT scan findings would be pure speculation as I believe that these changes have resolved. At this point I do not think a bronchoscopy or biopsy would be of any yield. Perhaps he had a reaction to chemotherapy which is now resolved.  Plan: -The best approach at this point is observation alone -Obtain a chest x-ray today to see if there is any evidence of airspace disease -Followup with me in 2 months if at that point he has not had complete resolution of shortness of breath then we will obtain a repeat CT scan and pulmonary function testing.

## 2013-09-13 NOTE — Patient Instructions (Signed)
We will call you with the results of the Chest X-ray  Stay active and exercise regularly We will see you back in 2 months or sooner if needed

## 2013-09-14 NOTE — Progress Notes (Signed)
Quick Note:  lmtcb X1 to relay results. ______ 

## 2013-09-22 NOTE — Progress Notes (Signed)
Quick Note:  lmtcb X2 to relay results. ______

## 2013-09-28 ENCOUNTER — Telehealth: Payer: Self-pay | Admitting: Hematology

## 2013-09-28 ENCOUNTER — Ambulatory Visit (HOSPITAL_BASED_OUTPATIENT_CLINIC_OR_DEPARTMENT_OTHER): Payer: PRIVATE HEALTH INSURANCE | Admitting: Hematology

## 2013-09-28 ENCOUNTER — Other Ambulatory Visit (HOSPITAL_BASED_OUTPATIENT_CLINIC_OR_DEPARTMENT_OTHER): Payer: PRIVATE HEALTH INSURANCE

## 2013-09-28 VITALS — BP 114/81 | HR 80 | Temp 98.6°F | Resp 18 | Ht 71.0 in | Wt 218.7 lb

## 2013-09-28 DIAGNOSIS — I5189 Other ill-defined heart diseases: Secondary | ICD-10-CM

## 2013-09-28 DIAGNOSIS — C8190 Hodgkin lymphoma, unspecified, unspecified site: Secondary | ICD-10-CM

## 2013-09-28 DIAGNOSIS — E669 Obesity, unspecified: Secondary | ICD-10-CM

## 2013-09-28 DIAGNOSIS — Z809 Family history of malignant neoplasm, unspecified: Secondary | ICD-10-CM

## 2013-09-28 DIAGNOSIS — I2510 Atherosclerotic heart disease of native coronary artery without angina pectoris: Secondary | ICD-10-CM

## 2013-09-28 DIAGNOSIS — E041 Nontoxic single thyroid nodule: Secondary | ICD-10-CM

## 2013-09-28 DIAGNOSIS — I728 Aneurysm of other specified arteries: Secondary | ICD-10-CM

## 2013-09-28 DIAGNOSIS — C8128 Mixed cellularity classical Hodgkin lymphoma, lymph nodes of multiple sites: Secondary | ICD-10-CM

## 2013-09-28 DIAGNOSIS — I10 Essential (primary) hypertension: Secondary | ICD-10-CM

## 2013-09-28 DIAGNOSIS — T451X5A Adverse effect of antineoplastic and immunosuppressive drugs, initial encounter: Secondary | ICD-10-CM

## 2013-09-28 DIAGNOSIS — E119 Type 2 diabetes mellitus without complications: Secondary | ICD-10-CM

## 2013-09-28 DIAGNOSIS — C8120 Mixed cellularity classical Hodgkin lymphoma, unspecified site: Secondary | ICD-10-CM

## 2013-09-28 DIAGNOSIS — I427 Cardiomyopathy due to drug and external agent: Secondary | ICD-10-CM

## 2013-09-28 LAB — CBC WITH DIFFERENTIAL/PLATELET
BASO%: 0.8 % (ref 0.0–2.0)
BASOS ABS: 0.1 10*3/uL (ref 0.0–0.1)
EOS%: 3.3 % (ref 0.0–7.0)
Eosinophils Absolute: 0.3 10*3/uL (ref 0.0–0.5)
HCT: 40.2 % (ref 38.4–49.9)
HGB: 13.4 g/dL (ref 13.0–17.1)
LYMPH%: 44.4 % (ref 14.0–49.0)
MCH: 29.8 pg (ref 27.2–33.4)
MCHC: 33.4 g/dL (ref 32.0–36.0)
MCV: 89 fL (ref 79.3–98.0)
MONO#: 0.9 10*3/uL (ref 0.1–0.9)
MONO%: 9 % (ref 0.0–14.0)
NEUT#: 4.1 10*3/uL (ref 1.5–6.5)
NEUT%: 42.5 % (ref 39.0–75.0)
Platelets: 339 10*3/uL (ref 140–400)
RBC: 4.52 10*6/uL (ref 4.20–5.82)
RDW: 14.8 % — AB (ref 11.0–14.6)
WBC: 9.7 10*3/uL (ref 4.0–10.3)
lymph#: 4.3 10*3/uL — ABNORMAL HIGH (ref 0.9–3.3)

## 2013-09-28 LAB — BASIC METABOLIC PANEL (CC13)
Anion Gap: 9 mEq/L (ref 3–11)
BUN: 14.9 mg/dL (ref 7.0–26.0)
CALCIUM: 9.4 mg/dL (ref 8.4–10.4)
CO2: 22 mEq/L (ref 22–29)
Chloride: 105 mEq/L (ref 98–109)
Creatinine: 1 mg/dL (ref 0.7–1.3)
Glucose: 108 mg/dl (ref 70–140)
POTASSIUM: 4.4 meq/L (ref 3.5–5.1)
SODIUM: 136 meq/L (ref 136–145)

## 2013-09-28 LAB — LACTATE DEHYDROGENASE (CC13): LDH: 193 U/L (ref 125–245)

## 2013-09-28 NOTE — Telephone Encounter (Signed)
gv and printed appt sched and avs for pt for OCT and Dec. °

## 2013-09-29 NOTE — Progress Notes (Signed)
Quick Note:  Called spoke with patient, advised of cxr results / recs as stated by BQ. Pt verbalized his understanding and denied any questions. ______

## 2013-10-02 NOTE — Progress Notes (Signed)
[name] OFFICE PROGRESS NOTE  Pcp Not In System No address on file  DIAGNOSIS: Hodgkin Lymphoma follow up  PRIOR TREATMENT: ABVD q 2 weeks started on 02/11/2013. He completed completed on 07/14/2013.   CURRENT TREATMENT: Active surveillance.     Hodgkin's disease with mixed cellularity   12/03/2012 Imaging CT Neck: Lymphadenopathy left cervical chain with right-sided axillary LAD.  Heterogenous thyroid with an 9 mm isthmus lesion.     12/29/2012 Imaging CXR: No bulky LAD. No active cardiopulmonary disease.    12/31/2012 Pathology Results Classical Hodgkin's disease of mixed cellularity.    12/31/2012 Procedure Excisional biopsy of left cervical/supraclavicular LAD.    01/14/2013 Initial Diagnosis Hodgkin's disease with mixed cellularity, Stage IIIB. (night sweats).  ESR greater than 50. LDH of 297.  IPS, 3 points for male, age greater than 32, albumin less than 4; 60% 5 year FFP, 78% 5-year OS Haseelever D, [id number].    01/18/2013 Imaging Echo: LV EF 50-55%.  Abnormal L ventricular relaxation (grade 1 diastolic dysfunction). Referred to cardiology Rowland Lathe) with recommendations to repeat Echo s/p 4 cycles.    01/24/2013 Cancer Staging PET/CT: Hypermetabolic LAD identified w/in the neck, chest, abdomen, and pelvis.  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  CT C/A/P:  2 nonspecific pulmonary nodules LLL, 3 mm and RLL 4 mm.    01/25/2013 Bone Marrow Biopsy Bone marrow without lymphoma involvement.  Normal cytogenetics. Normocellular for age with trileage hematopoiesis without tumor identifed.    02/03/2013 Procedure R port-a-cath placment.    02/04/2013 Procedure S/p successful complex coil, plug and liquid embolization of left hepatic artery aneurysm by IR (Dr. Karin Golden)   02/11/2013 -  Chemotherapy Started ABVD q 28 days (Adriamycin 25 mg/m2, vinblastine 6 mg/m2, bleomycin 10 units/m2, dacarbazine 275 mg/m2.   based on Duggan DB, JCO, 2003. He is scheduled  for cycle #5 on 06/03/2013.   02/11/2013 Family History Referral to genetics.  Jewish acenstry with hodgkin's disease in father; mother died of an abdominal aneurysm.    04/06/2013 Imaging PET/CT: Significantly improved thoracic, retroperitoneal, and pelvic lymphadenopathy, as described above, max SUV 2.9. Deauville score 2.      07/28/2013 Imaging PET consistent with CR (Deaville 2).  However, new nodular airspace disease was noted. Referred to pulmonary and repeat PFTs ordered.    09/13/2013 Imaging DG CHEST 2 view shows no active lung disease. port tip in position. no infiltrate or effusion.   INTERVAL HISTORY: [name] [name] 48 y.o. male with a history of classical hodgkin's disease of mixed cellularity, stage III b is here for one month follow-up and for his first encounter with me.  He was last seen by Dr Juliann Mule on 07/29/2013. He continues to deny fevers or chills or further night sweats or weight loss.  He denies any breathing problems.  He reports that his breathing is better and he just had a CXR done which showed resolution of his patchy air space disease findings from previous pet scan. He reports good energy and appetite.  Labs were done today.  MEDICAL HISTORY: Past Medical History  Diagnosis Date  . Hypertension   . Diabetes mellitus without complication     Pre-diabetes  . Depression   . Headache(784.0)   . Thyroid disease     Hx: of  . Cancer 2015    cervical lymphadenopathy  . Hodgkin lymphoma 2015  . Hepatic artery aneurysm 2015    INTERIM HISTORY: has Cervical lymphadenopathy; Hodgkin's disease with mixed  cellularity; DM (diabetes mellitus); Hyperlipemia; Obesity; Hypothyroidism; Aneurysm artery, hepatic; Family history of cancer; Diastolic dysfunction; Thyroid lesion; Essential hypertension; At risk for coronary artery disease; Chemotherapy-induced cardiomyopathy; Abnormal CT scan, chest; and Shortness of breath on his problem list.    ALLERGIES:  has No Known  Allergies.  MEDICATIONS: has a current medication list which includes the following prescription(s): carvedilol, eszopiclone, fenofibrate, fexofenadine, levothyroxine, lidocaine-prilocaine, lisinopril, lovastatin, metformin, montelukast, naproxen sodium, oxycodone, potassium chloride, and sertraline.  SURGICAL HISTORY:  Past Surgical History  Procedure Laterality Date  . Tonsillectomy    . Mass excision Left 12/31/2012    Procedure: CERVICAL LYMPH NODE BIOPSY;  Surgeon: [name], MD;  Location: [facility];  Service: General;  Laterality: Left;    REVIEW OF SYSTEMS:   Constitutional: Denies fevers, chills or abnormal weight loss Eyes: Denies blurriness of vision Ears, nose, mouth, throat, and face: Denies mucositis or sore throat Respiratory: Denies cough, dyspnea or wheezes Cardiovascular: Denies palpitation, chest discomfort or lower extremity swelling Gastrointestinal:  Denies nausea, heartburn or change in bowel habits Skin: Denies abnormal skin rashes Lymphatics: Denies new lymphadenopathy or easy bruising Neurological:Denies numbness, tingling or new weaknesses Behavioral/Psych: Mood is stable, no new changes  All other systems were reviewed with the patient and are negative.  PHYSICAL EXAMINATION: ECOG PERFORMANCE STATUS: 0 - Asymptomatic  Blood pressure 114/81, pulse 80, temperature 98.6 F (37 C), temperature source Oral, resp. rate 18, height 5' 11"  (1.803 m), weight 218 lb 11.2 oz (99.202 kg), SpO2 97.00%. Oxygen 96% of R.A.   GENERAL:alert, no distress and comfortable; well developed and well nourished. Alopecia SKIN: skin color, texture, turgor are normal, no rashes or significant lesions; R portacath. EYES: normal, Conjunctiva are pink and non-injected, sclera clear OROPHARYNX:no exudate, no erythema and lips, buccal mucosa, and tongue normal  NECK: supple, thyroid normal size, non-tender, without nodularity LYMPH:  no palpable lymphadenopathy in the cervical; small  less than a cm adenopathy in the R supraclavicular area. Decreased adenopathy (less than 0.5 cm) in the L axillary. LUNGS: rhonchi noted in upper fields with normal breathing effort, no wheezes  HEART: regular rate & rhythm and no murmurs and no lower extremity edema ABDOMEN:abdomen soft, non-tender and normal bowel sounds Musculoskeletal:no cyanosis of digits and no clubbing  NEURO: alert & oriented x 3 with fluent speech, no focal motor/sensory deficits  Labs:  Lab Results  Component Value Date   WBC 9.7 09/28/2013   HGB 13.4 09/28/2013   HCT 40.2 09/28/2013   MCV 89.0 09/28/2013   PLT 339 09/28/2013   NEUTROABS 4.1 09/28/2013      Chemistry      Component Value Date/Time   NA 136 09/28/2013 1456   NA 137 02/05/2013 0528   K 4.4 09/28/2013 1456   K 4.1 02/05/2013 0528   CL 98 02/05/2013 0528   CO2 22 09/28/2013 1456   CO2 28 02/05/2013 0528   BUN 14.9 09/28/2013 1456   BUN 14 02/05/2013 0528   CREATININE 1.0 09/28/2013 1456   CREATININE 0.99 02/05/2013 0528      Component Value Date/Time   CALCIUM 9.4 09/28/2013 1456   CALCIUM 9.0 02/05/2013 0528   ALKPHOS 58 08/31/2013 1434   ALKPHOS 97 02/05/2013 0528   AST 38* 08/31/2013 1434   AST 24 02/05/2013 0528   ALT 34 08/31/2013 1434   ALT 12 02/05/2013 0528   BILITOT 0.29 08/31/2013 1434   BILITOT 0.3 02/05/2013 0528       RADIOGRAPHIC STUDIES: DG CHEST 2  VIEW 09/13/2013   IMPRESSION:  No active lung disease. Port-A-Cath tip overlies the expected SVC  -RA junction  NM PET SCAN 07/28/2013    IMPRESSION:  1. Continued positive response to chemotherapy with decrease in metabolic activity of left cervical lymph nodes, right axial lymph  nodes, and retroperitoneal lymph nodes. Metabolic activity is less than or equal to blood pool ( Deauville 2).  2. New hypermetabolic nodular airspace disease within the left and right upper lobes. This is concerning for a a drug reaction versus  multifocal pneumonia. Cannot exclude lymphoma although this is  less is favor in light of the positive response lymph nodes    ASSESSMENT: [name] [name] 48 y.o. male with a history of Hodgkin's Lymphoma for follow up.  PLAN:  1. Classical Hodgkin's Disease with mixed cellularity, c/w Stage IIIB (if pulmonary nodules which are less than 1cm are negative). Bone marrow is negative for lymphoma. IPS for Hodgkin's disease (3 points for male, age greater than 24, albumin less than 4; 60% 5 year FFP, 78% 5-year overall survival based on Haseelever D, NEJM, 1998).   --We finished chemotherapy with ABVD x 6 cycles (category 2A) q28 days (doxorubicin, bleomycin, vinblastine, and dacarbazine) (Duggan DB, JCO, 2003). We had a detailed discussion regarding the benefits of including approximately 80 percent of patients with advanced stage HL will attain a complete response after treatment with ABVD. Up to one-quarter of patients will have disease progression requiring further therapy; half of those will have long term survival with autologous hematopoietic cell transplantation. Overall survival rates at 4, 7, and 10 years are approximately 90, 75, and 55 percent, respectively.  --He completed 6 cycles of ABVD as noted above.  We obtained an restaging PET-CT following Cycle #2 (NCCN Guidelines, Version 2.2014, HODG-9) to assess response and further treatment based on deaville criteria for response. Deaville criteria was 2 so therefore we will continue with this ABVD therapy.  Repeat PET following 6 cycles of ABVD on 07/28/2013 demonstrated Deaville criteria 2 as well.   2. Nodular airspace disease plus cough concerning for bleomycin lung injury. --Patient denies fevers or new medications.  Based on upper lobe bilateral opacities, we referred to pulmonary. PFTs were repeated demonstrating moderate restriction.  Likely the airspace disease was a result of Bleomycin induced Lung Injury which has resolved now and he does not appear to have any crepitations in his lung and chest  xray looks imrpoved.  3. Left Hepatic artery aneurysm S/p embolization by IR on 02-04-2013.  --He had follow-up with Dr. Geroge Baseman s/p coil embolization on 02/04/2013.  His MRI of brain and CTA was as noted above.  He will continue annual follow up with Dr. Geroge Baseman. His liver enzymes remain normal limits. He was counseled to report any symptoms of abdominal pain and avoid any sports or high energy exercises. He voiced understanding and agreement with this plan.   4. Heterogeneous thyroid with a in 9 mm isthmus lesion.  --Consider further evaluation with thyroid ultrasound. If patient is clinically hyperthyroid, consider nuclear medicine thyroid uptake and scan. TSH was 9.659 on 1/85/6314.   5. Diastolic Dysfunction, Grade I.  --Echocardiogram as noted above. Patient has a history of DM2, Hypertension. Repeat Echocardiogram following cycle #4 obtained and reviewed with the patient today.  It is negative for diastolic dysfunction and a normal EF of 65%.    6. Family History of cancer, cancer prior to age of 93.  --Patient has Jewish ancestry and reports father was also diagnosed  with hodgkin's disease. He declined BRCA testing.  He was seen by genetic counselor on 03/28/2013.   7. DM2.  --Continue Metformin 500 mg daily per PCP/endo.   8. Hypertension/HLD/CAD.  --Continue lisinopril 10 mg daily.  --Continue hydrochlorothiazide 50 mg daily with KCL 10 mEQ daily.  --Continue Lovastatin 500 mg daily.   9. Hypothyroidism  --Continue levothyroxine 200 mg daily per endo. Checked TSH, T4. TSH slightly high. Thyroid ultrasound as needed. (see #4)   10. Obesity  --Continue dieting and weight lost.   11. Follow-up.  --Patient will require follow-up in 3 months (01/02/14) with chemistries including LDH, CBC and CMP. We will do another CT scan in 6 months for restaging. He will continue port flushes and after next visit, we will have port removed.  All questions were answered. The patient knows  to call the clinic with any problems, questions or concerns. We can certainly see the patient much sooner if necessary.  I spent 20 minutes counseling the patient face to face. The total time spent in the appointment was 25 minutes.    Bernadene Bell, MD Medical Hematologist/Oncologist [place] Pager: [phone] Office No: [phone]

## 2013-11-03 ENCOUNTER — Ambulatory Visit (HOSPITAL_BASED_OUTPATIENT_CLINIC_OR_DEPARTMENT_OTHER): Payer: Commercial Managed Care - PPO

## 2013-11-03 DIAGNOSIS — Z452 Encounter for adjustment and management of vascular access device: Secondary | ICD-10-CM | POA: Diagnosis not present

## 2013-11-03 DIAGNOSIS — C8121 Mixed cellularity classical Hodgkin lymphoma, lymph nodes of head, face, and neck: Secondary | ICD-10-CM | POA: Diagnosis not present

## 2013-11-03 DIAGNOSIS — C8190 Hodgkin lymphoma, unspecified, unspecified site: Secondary | ICD-10-CM

## 2013-11-03 MED ORDER — HEPARIN SOD (PORK) LOCK FLUSH 100 UNIT/ML IV SOLN
500.0000 [IU] | Freq: Once | INTRAVENOUS | Status: AC
Start: 2013-11-03 — End: 2013-11-03
  Administered 2013-11-03: 500 [IU] via INTRAVENOUS
  Filled 2013-11-03: qty 5

## 2013-11-03 MED ORDER — SODIUM CHLORIDE 0.9 % IJ SOLN
10.0000 mL | INTRAMUSCULAR | Status: DC | PRN
  Administered 2013-11-03: 10 mL via INTRAVENOUS
  Filled 2013-11-03: qty 10

## 2013-11-03 NOTE — Patient Instructions (Signed)

## 2013-11-14 ENCOUNTER — Ambulatory Visit (INDEPENDENT_AMBULATORY_CARE_PROVIDER_SITE_OTHER): Payer: Commercial Managed Care - PPO | Admitting: Pulmonary Disease

## 2013-11-14 ENCOUNTER — Encounter: Payer: Self-pay | Admitting: Pulmonary Disease

## 2013-11-14 VITALS — BP 110/82 | HR 87 | Temp 98.2°F | Ht 70.0 in | Wt 227.0 lb

## 2013-11-14 DIAGNOSIS — R938 Abnormal findings on diagnostic imaging of other specified body structures: Secondary | ICD-10-CM

## 2013-11-14 DIAGNOSIS — R9389 Abnormal findings on diagnostic imaging of other specified body structures: Secondary | ICD-10-CM

## 2013-11-14 NOTE — Patient Instructions (Signed)
Let us know if you have any trouble with breathing or if the repeat CT scan is abnormal Otherwise, just follow up as needed

## 2013-11-14 NOTE — Progress Notes (Signed)
   Subjective:    Patient ID: [name] [name], male    DOB: 1965-04-05, 48 y.o.   MRN: [id number]  Synopsis: [name] [name] first saw [facility] in 2015 when he was noted to have an abnormal CT chest after receiving a bleomycin containing regimen for treatment of lymphoma.  By the the time he came to see me his CXR had cleared and he had no symptoms.    HPI  11/14/2013 routine office visitMr. [name] [name] returns to clinic today telling me that he has been feeling great. He has returned to regular activity with work and exercise. He does not have prompt of shortness of breath, cough, or mucus production. He has not had chest pain. As far as he knows his lymphoma is in remission. He is doing very well.  Review of Systems     Objective:   Physical Exam Filed Vitals:   11/14/13 1650  BP: 110/82  Pulse: 87  Temp: 98.2 F (36.8 C)  TempSrc: Oral  Height: 5\' 10"  (1.778 m)  Weight: 227 lb (102.967 kg)  SpO2: 97%    Ambulated 500 feet on room air and his O2 saturation remained above 93%  Gen: well appearing, no acute distress HEENT: NCAT, EOMi, OP clear, PULM: CTA B CV: RRR, no mgr, no JVD AB: BS+, soft, nontender, Ext: warm, no edema, no clubbing, no cyanosis Derm: no rash or skin breakdown Neuro: A&Ox4, MAEW      Assessment & Plan:   Abnormal CT scan, chest His September 2015 chest x-ray was normal. He currently has no symptoms of pulmonary disease and his ambulatory oximetry is normal. Based on this I do not see clear evidence of any lung disease at all.   Plan:  -follow-up with me as needed  -if his neck CT chest performed for surveillance purposes shows residual lung disease than I am happy to talk to him about that here in clinic.    Updated Medication List Outpatient Encounter Prescriptions as of 11/14/2013  Medication Sig  . carvedilol (COREG) 6.25 MG tablet Take 1 tablet (6.25 mg total) by mouth 2 (two) times daily.  . eszopiclone (LUNESTA) 2 MG TABS tablet Take  2 mg by mouth at bedtime as needed for sleep. Take immediately before bedtime  . fenofibrate (TRICOR) 145 MG tablet Take 1 tablet (145 mg total) by mouth daily.  . fexofenadine (ALLEGRA) 180 MG tablet Take 180 mg by mouth daily.  Marland Kitchen levothyroxine (SYNTHROID, LEVOTHROID) 200 MCG tablet Take 200 mcg by mouth daily before breakfast.  . lidocaine-prilocaine (EMLA) cream Apply 1 application topically as needed. Apply to port site one hour before treatment and cover with plastic wrap.  . lisinopril (PRINIVIL,ZESTRIL) 10 MG tablet Take 10 mg by mouth daily with breakfast.   . lovastatin (ALTOPREV) 40 MG 24 hr tablet Take 40 mg by mouth at bedtime.  . metFORMIN (GLUCOPHAGE) 500 MG tablet Take 500 mg by mouth daily.  . montelukast (SINGULAIR) 10 MG tablet Take 10 mg by mouth at bedtime.  . naproxen sodium (ANAPROX) 220 MG tablet Take 220 mg by mouth daily as needed (pain).  Marland Kitchen oxyCODONE (OXY IR/ROXICODONE) 5 MG immediate release tablet Take 1 tablet (5 mg total) by mouth every 6 (six) hours as needed for severe pain.  . potassium chloride (K-DUR,KLOR-CON) 10 MEQ tablet Take 10 mEq by mouth daily.   . sertraline (ZOLOFT) 100 MG tablet Take 100 mg by mouth daily with breakfast.

## 2013-11-14 NOTE — Assessment & Plan Note (Signed)
His September 2015 chest x-ray was normal. He currently has no symptoms of pulmonary disease and his ambulatory oximetry is normal. Based on this I do not see clear evidence of any lung disease at all.   Plan:  -follow-up with me as needed  -if his neck CT chest performed for surveillance purposes shows residual lung disease than I am happy to talk to him about that here in clinic.

## 2013-12-21 ENCOUNTER — Telehealth: Payer: Self-pay | Admitting: Internal Medicine

## 2013-12-21 NOTE — Telephone Encounter (Signed)
MOVED CP1 12/17 TO MM 12/30. S/W PT HE IS AWARE.

## 2013-12-29 ENCOUNTER — Other Ambulatory Visit: Payer: PRIVATE HEALTH INSURANCE

## 2013-12-29 ENCOUNTER — Ambulatory Visit: Payer: PRIVATE HEALTH INSURANCE

## 2014-01-11 ENCOUNTER — Ambulatory Visit (HOSPITAL_BASED_OUTPATIENT_CLINIC_OR_DEPARTMENT_OTHER): Payer: Commercial Managed Care - PPO | Admitting: Internal Medicine

## 2014-01-11 ENCOUNTER — Ambulatory Visit: Payer: Commercial Managed Care - PPO

## 2014-01-11 ENCOUNTER — Telehealth: Payer: Self-pay | Admitting: Internal Medicine

## 2014-01-11 ENCOUNTER — Other Ambulatory Visit (HOSPITAL_BASED_OUTPATIENT_CLINIC_OR_DEPARTMENT_OTHER): Payer: Commercial Managed Care - PPO

## 2014-01-11 ENCOUNTER — Encounter: Payer: Self-pay | Admitting: Internal Medicine

## 2014-01-11 VITALS — BP 141/72 | HR 87 | Temp 98.2°F | Resp 18 | Ht 70.0 in | Wt 225.7 lb

## 2014-01-11 DIAGNOSIS — Z95828 Presence of other vascular implants and grafts: Secondary | ICD-10-CM

## 2014-01-11 DIAGNOSIS — J8410 Pulmonary fibrosis, unspecified: Secondary | ICD-10-CM | POA: Diagnosis not present

## 2014-01-11 DIAGNOSIS — C8120 Mixed cellularity classical Hodgkin lymphoma, unspecified site: Secondary | ICD-10-CM

## 2014-01-11 DIAGNOSIS — C8128 Mixed cellularity classical Hodgkin lymphoma, lymph nodes of multiple sites: Secondary | ICD-10-CM | POA: Diagnosis not present

## 2014-01-11 DIAGNOSIS — C8190 Hodgkin lymphoma, unspecified, unspecified site: Secondary | ICD-10-CM

## 2014-01-11 DIAGNOSIS — R599 Enlarged lymph nodes, unspecified: Secondary | ICD-10-CM | POA: Diagnosis not present

## 2014-01-11 LAB — CBC WITH DIFFERENTIAL/PLATELET
BASO%: 0.9 % (ref 0.0–2.0)
BASOS ABS: 0.1 10*3/uL (ref 0.0–0.1)
EOS%: 3.3 % (ref 0.0–7.0)
Eosinophils Absolute: 0.3 10*3/uL (ref 0.0–0.5)
HEMATOCRIT: 40.4 % (ref 38.4–49.9)
HEMOGLOBIN: 13.5 g/dL (ref 13.0–17.1)
LYMPH%: 32.2 % (ref 14.0–49.0)
MCH: 30.8 pg (ref 27.2–33.4)
MCHC: 33.4 g/dL (ref 32.0–36.0)
MCV: 92.3 fL (ref 79.3–98.0)
MONO#: 0.7 10*3/uL (ref 0.1–0.9)
MONO%: 9.3 % (ref 0.0–14.0)
NEUT#: 4.2 10*3/uL (ref 1.5–6.5)
NEUT%: 54.3 % (ref 39.0–75.0)
Platelets: 310 10*3/uL (ref 140–400)
RBC: 4.38 10*6/uL (ref 4.20–5.82)
RDW: 14.2 % (ref 11.0–14.6)
WBC: 7.7 10*3/uL (ref 4.0–10.3)
lymph#: 2.5 10*3/uL (ref 0.9–3.3)

## 2014-01-11 LAB — COMPREHENSIVE METABOLIC PANEL (CC13)
ALT: 44 U/L (ref 0–55)
ANION GAP: 9 meq/L (ref 3–11)
AST: 41 U/L — ABNORMAL HIGH (ref 5–34)
Albumin: 4.2 g/dL (ref 3.5–5.0)
Alkaline Phosphatase: 56 U/L (ref 40–150)
BUN: 12.4 mg/dL (ref 7.0–26.0)
CALCIUM: 9.6 mg/dL (ref 8.4–10.4)
CHLORIDE: 102 meq/L (ref 98–109)
CO2: 26 mEq/L (ref 22–29)
CREATININE: 1.3 mg/dL (ref 0.7–1.3)
EGFR: 66 mL/min/{1.73_m2} — AB (ref >90)
Glucose: 134 mg/dl (ref 70–140)
Potassium: 4 mEq/L (ref 3.5–5.1)
SODIUM: 136 meq/L (ref 136–145)
Total Bilirubin: 0.64 mg/dL (ref 0.20–1.20)
Total Protein: 7.8 g/dL (ref 6.4–8.3)

## 2014-01-11 LAB — LACTATE DEHYDROGENASE (CC13): LDH: 196 U/L (ref 125–245)

## 2014-01-11 MED ORDER — HEPARIN SOD (PORK) LOCK FLUSH 100 UNIT/ML IV SOLN
500.0000 [IU] | Freq: Once | INTRAVENOUS | Status: AC
Start: 2014-01-11 — End: 2014-01-11
  Administered 2014-01-11: 500 [IU] via INTRAVENOUS
  Filled 2014-01-11: qty 5

## 2014-01-11 MED ORDER — SODIUM CHLORIDE 0.9 % IJ SOLN
10.0000 mL | INTRAMUSCULAR | Status: DC | PRN
  Administered 2014-01-11: 10 mL via INTRAVENOUS
  Filled 2014-01-11: qty 10

## 2014-01-11 NOTE — Patient Instructions (Signed)

## 2014-01-11 NOTE — Telephone Encounter (Signed)
gv an dprinted appt sched and avs for pt for Feb thru June 2016

## 2014-01-11 NOTE — Progress Notes (Signed)
[place] [phone]   [phone]  OFFICE PROGRESS NOTE  Pcp Not In System No address on file  DIAGNOSIS: Stage III classical Hodgkin's lymphoma, mixed cellularity subtype diagnosed in December 2014.  PRIOR THERAPY: Status post 6 cycles of systemic chemotherapy with ABVD complicated with pulmonary fibrosis, last dose was given 07/14/2013  CURRENT THERAPY: Observation  INTERVAL HISTORY: [name] [name] 48 y.o. male returns to the clinic today for follow-up visit. He is a previous patient of Dr. Juliann Mule who came today to establish care with me. The patient was diagnosed with a stage III classical Hodgkin's lymphoma with mixed cellularity subtype in December 2014 and treated with 6 cycles of systemic chemotherapy with ABVD. He tolerated his treatment well but this was complicated with pulmonary fibrosis which was evaluated by Dr. Lake Bells and has significant improvement over the last few months. The patient is feeling fine today with no specific complaints. He denied having any significant chest pain, shortness of breath, cough or hemoptysis. He denied having any significant weight loss or night sweats. His last PET scan performed in July 2015 showed continued metastasis post chemotherapy with decrease in the metabolic activity of the left cervical lymph nodes, right axillary and retroperitoneal lymph nodes. The patient has a palpable lymph node in the left supraclavicular area. He feels that was getting firm. He is worried about disease recurrence.   MEDICAL HISTORY: Past Medical History  Diagnosis Date  . Hypertension   . Diabetes mellitus without complication     Pre-diabetes  . Depression   . Headache(784.0)   . Thyroid disease     Hx: of  . Cancer 2015    cervical lymphadenopathy  . Hodgkin lymphoma 2015  . Hepatic artery aneurysm 2015    ALLERGIES:  has No Known Allergies.  MEDICATIONS:  Current Outpatient Prescriptions  Medication Sig  Dispense Refill  . carvedilol (COREG) 6.25 MG tablet Take 1 tablet (6.25 mg total) by mouth 2 (two) times daily. 60 tablet 11  . eszopiclone (LUNESTA) 2 MG TABS tablet Take 2 mg by mouth at bedtime as needed for sleep. Take immediately before bedtime    . fenofibrate (TRICOR) 145 MG tablet Take 1 tablet (145 mg total) by mouth daily. 30 tablet 6  . fexofenadine (ALLEGRA) 180 MG tablet Take 180 mg by mouth daily.    Marland Kitchen levothyroxine (SYNTHROID, LEVOTHROID) 200 MCG tablet Take 200 mcg by mouth daily before breakfast.    . lidocaine-prilocaine (EMLA) cream Apply 1 application topically as needed. Apply to port site one hour before treatment and cover with plastic wrap. 30 g 1  . lisinopril (PRINIVIL,ZESTRIL) 10 MG tablet Take 10 mg by mouth daily with breakfast.     . metFORMIN (GLUCOPHAGE) 500 MG tablet Take 500 mg by mouth daily.    . montelukast (SINGULAIR) 10 MG tablet Take 10 mg by mouth at bedtime.    . naproxen sodium (ANAPROX) 220 MG tablet Take 220 mg by mouth daily as needed (pain).    Marland Kitchen oxyCODONE (OXY IR/ROXICODONE) 5 MG immediate release tablet Take 1 tablet (5 mg total) by mouth every 6 (six) hours as needed for severe pain. 60 tablet 0  . potassium chloride (K-DUR,KLOR-CON) 10 MEQ tablet Take 10 mEq by mouth daily.     . sertraline (ZOLOFT) 100 MG tablet Take 100 mg by mouth daily with breakfast.     . lovastatin (ALTOPREV) 40 MG 24 hr tablet Take 40 mg by mouth at  bedtime.     No current facility-administered medications for this visit.    SURGICAL HISTORY:  Past Surgical History  Procedure Laterality Date  . Tonsillectomy    . Mass excision Left 12/31/2012    Procedure: CERVICAL LYMPH NODE BIOPSY;  Surgeon: [name], MD;  Location: [facility];  Service: General;  Laterality: Left;    REVIEW OF SYSTEMS:  Constitutional: negative Eyes: negative Ears, nose, mouth, throat, and face: negative Respiratory: negative Cardiovascular: negative Gastrointestinal:  negative Genitourinary:negative Integument/breast: negative Hematologic/lymphatic: negative Musculoskeletal:negative Neurological: negative Behavioral/Psych: negative Endocrine: negative Allergic/Immunologic: negative   PHYSICAL EXAMINATION: General appearance: alert, cooperative and no distress Head: Normocephalic, without obvious abnormality, atraumatic Neck: no JVD, supple, symmetrical, trachea midline, thyroid not enlarged, symmetric, no tenderness/mass/nodules and Palpable small left lower cervical lymph node, firm to palpation Lymph nodes: Palpable small left lower cervical lymph node, firm to palpation Resp: clear to auscultation bilaterally Back: symmetric, no curvature. ROM normal. No CVA tenderness. Cardio: regular rate and rhythm, S1, S2 normal, no murmur, click, rub or gallop GI: soft, non-tender; bowel sounds normal; no masses,  no organomegaly Extremities: extremities normal, atraumatic, no cyanosis or edema Neurologic: Alert and oriented X 3, normal strength and tone. Normal symmetric reflexes. Normal coordination and gait  ECOG PERFORMANCE STATUS: 0 - Asymptomatic  Blood pressure 141/72, pulse 87, temperature 98.2 F (36.8 C), temperature source Oral, resp. rate 18, height 5\' 10"  (1.778 m), weight 225 lb 11.2 oz (102.377 kg), SpO2 100 %.  LABORATORY DATA: Lab Results  Component Value Date   WBC 7.7 01/11/2014   HGB 13.5 01/11/2014   HCT 40.4 01/11/2014   MCV 92.3 01/11/2014   PLT 310 01/11/2014      Chemistry      Component Value Date/Time   NA 136 01/11/2014 1242   NA 137 02/05/2013 0528   K 4.0 01/11/2014 1242   K 4.1 02/05/2013 0528   CL 98 02/05/2013 0528   CO2 26 01/11/2014 1242   CO2 28 02/05/2013 0528   BUN 12.4 01/11/2014 1242   BUN 14 02/05/2013 0528   CREATININE 1.3 01/11/2014 1242   CREATININE 0.99 02/05/2013 0528      Component Value Date/Time   CALCIUM 9.6 01/11/2014 1242   CALCIUM 9.0 02/05/2013 0528   ALKPHOS 56 01/11/2014 1242    ALKPHOS 97 02/05/2013 0528   AST 41* 01/11/2014 1242   AST 24 02/05/2013 0528   ALT 44 01/11/2014 1242   ALT 12 02/05/2013 0528   BILITOT 0.64 01/11/2014 1242   BILITOT 0.3 02/05/2013 0528       RADIOGRAPHIC STUDIES: No results found.  ASSESSMENT AND PLAN: This is a very pleasant 48 years old white male with history of stage III Hodgkin's lymphoma status post 6 cycles of systemic chemotherapy with ABVD with almost complete response. He has been observation since July 2015 but the patient has a palpable left lower cervical lymph node concerning for residual disease. I had a lengthy discussion with the patient today about his condition. I recommended for him to have repeat CT scan of the neck, chest, abdomen and pelvis next week for evaluation of his disease. If there is no evidence for disease recurrence, I would see the patient back for follow-up visit in 6 months with repeat CBC, comprehensive metabolic panel, LDH as well as repeat CT scan of the neck, chest, abdomen and pelvis. He will continue to have Port-A-Cath flush every 2 months. For the history of pulmonary fibrosis, the patient will continue his  routine follow-up visit with Dr. Lake Bells He was advised to call immediately if he has any concerning symptoms in the interval. The patient voices understanding of current disease status and treatment options and is in agreement with the current care plan.  All questions were answered. The patient knows to call the clinic with any problems, questions or concerns. We can certainly see the patient much sooner if necessary.  I spent 20 minutes counseling the patient face to face. The total time spent in the appointment was 30 minutes.  Disclaimer: This note was dictated with voice recognition software. Similar sounding words can inadvertently be transcribed and may not be corrected upon review.

## 2014-01-17 ENCOUNTER — Encounter (HOSPITAL_COMMUNITY): Payer: Self-pay

## 2014-01-17 ENCOUNTER — Other Ambulatory Visit: Payer: Self-pay | Admitting: Internal Medicine

## 2014-01-17 ENCOUNTER — Ambulatory Visit (HOSPITAL_COMMUNITY)
Admission: RE | Admit: 2014-01-17 | Discharge: 2014-01-17 | Disposition: A | Discharge status: Home / Self Care | Payer: Commercial Managed Care - PPO | Source: Ambulatory Visit | Attending: Internal Medicine | Admitting: Internal Medicine

## 2014-01-17 DIAGNOSIS — Z9221 Personal history of antineoplastic chemotherapy: Secondary | ICD-10-CM | POA: Insufficient documentation

## 2014-01-17 DIAGNOSIS — C8190 Hodgkin lymphoma, unspecified, unspecified site: Secondary | ICD-10-CM | POA: Diagnosis present

## 2014-01-17 DIAGNOSIS — R599 Enlarged lymph nodes, unspecified: Secondary | ICD-10-CM | POA: Insufficient documentation

## 2014-01-17 DIAGNOSIS — R160 Hepatomegaly, not elsewhere classified: Secondary | ICD-10-CM | POA: Insufficient documentation

## 2014-01-17 DIAGNOSIS — I2510 Atherosclerotic heart disease of native coronary artery without angina pectoris: Secondary | ICD-10-CM | POA: Insufficient documentation

## 2014-01-17 DIAGNOSIS — K760 Fatty (change of) liver, not elsewhere classified: Secondary | ICD-10-CM | POA: Insufficient documentation

## 2014-01-17 DIAGNOSIS — C8120 Mixed cellularity classical Hodgkin lymphoma, unspecified site: Secondary | ICD-10-CM

## 2014-01-17 MED ORDER — IOHEXOL 300 MG/ML  SOLN
50.0000 mL | Freq: Once | INTRAMUSCULAR | Status: AC | PRN
  Administered 2014-01-17: 50 mL via ORAL

## 2014-01-17 MED ORDER — IOHEXOL 300 MG/ML  SOLN
100.0000 mL | Freq: Once | INTRAMUSCULAR | Status: AC | PRN
  Administered 2014-01-17: 100 mL via INTRAVENOUS

## 2014-01-18 ENCOUNTER — Telehealth: Payer: Self-pay | Admitting: Internal Medicine

## 2014-01-18 NOTE — Telephone Encounter (Signed)
s.w. pt and advised on Jan appt.Marland KitchenMarland KitchenMarland KitchenMarland Kitchenpt ok and aware of appt

## 2014-01-26 ENCOUNTER — Encounter (HOSPITAL_COMMUNITY)
Admission: RE | Admit: 2014-01-26 | Discharge: 2014-01-26 | Disposition: A | Discharge status: Home / Self Care | Payer: Commercial Managed Care - PPO | Source: Ambulatory Visit | Attending: Internal Medicine | Admitting: Internal Medicine

## 2014-01-26 ENCOUNTER — Other Ambulatory Visit: Payer: Self-pay | Admitting: Internal Medicine

## 2014-01-26 DIAGNOSIS — C8120 Mixed cellularity classical Hodgkin lymphoma, unspecified site: Secondary | ICD-10-CM | POA: Insufficient documentation

## 2014-01-26 LAB — GLUCOSE, CAPILLARY: GLUCOSE-CAPILLARY: 177 mg/dL — AB (ref 70–99)

## 2014-01-26 MED ORDER — FLUDEOXYGLUCOSE F - 18 (FDG) INJECTION: 11.1000 | Freq: Once | INTRAVENOUS | Status: AC | PRN

## 2014-01-27 ENCOUNTER — Encounter: Payer: Self-pay | Admitting: Internal Medicine

## 2014-02-01 ENCOUNTER — Encounter: Payer: Self-pay | Admitting: Physician Assistant

## 2014-02-01 ENCOUNTER — Ambulatory Visit (HOSPITAL_BASED_OUTPATIENT_CLINIC_OR_DEPARTMENT_OTHER): Payer: Commercial Managed Care - PPO | Admitting: Physician Assistant

## 2014-02-01 ENCOUNTER — Telehealth: Payer: Self-pay | Admitting: Internal Medicine

## 2014-02-01 VITALS — BP 133/85 | HR 103 | Temp 97.7°F | Resp 19 | Ht 70.0 in | Wt 228.1 lb

## 2014-02-01 DIAGNOSIS — J8410 Pulmonary fibrosis, unspecified: Secondary | ICD-10-CM | POA: Diagnosis not present

## 2014-02-01 DIAGNOSIS — C8120 Mixed cellularity classical Hodgkin lymphoma, unspecified site: Secondary | ICD-10-CM

## 2014-02-01 DIAGNOSIS — C8128 Mixed cellularity classical Hodgkin lymphoma, lymph nodes of multiple sites: Secondary | ICD-10-CM | POA: Diagnosis not present

## 2014-02-01 NOTE — Patient Instructions (Signed)
You are being referred to [facility] for autologous stem cell transplant Follow-up in approximately 3-4 weeks

## 2014-02-01 NOTE — Telephone Encounter (Signed)
added lab/MM for 2/15. gave pt avs report for feb thru june

## 2014-02-01 NOTE — Progress Notes (Addendum)
[facility] [phone]   Fax:(336) [phone]  OFFICE PROGRESS NOTE  Pcp Not In System No address on file  DIAGNOSIS: Stage III classical Hodgkin's lymphoma, mixed cellularity subtype diagnosed in December 2014.  PRIOR THERAPY: Status post 6 cycles of systemic chemotherapy with ABVD complicated with pulmonary fibrosis, last dose was given 07/14/2013  CURRENT THERAPY: Observation  INTERVAL HISTORY: [name] [name] 49 y.o. male returns to the clinic today for follow-up visit. The patient was previously followed by Dr. Juliann Mule and is now off followed by Dr. Julien Nordmann.  The patient was diagnosed with a stage III classical Hodgkin's lymphoma with mixed cellularity subtype in December 2014 and treated with 6 cycles of systemic chemotherapy with ABVD. He tolerated his treatment well but this was complicated with pulmonary fibrosis which was evaluated by Dr. Lake Bells and has significant improvement over the last few months. The patient is feeling fine today with no specific complaints. He denied having any significant chest pain, shortness of breath, cough or hemoptysis. He denied having any significant weight loss or night sweats. The patient has a palpable lymph node in the left supraclavicular area. He feels that was getting firm. He is worried about disease recurrence. He had a restaging PET scan performed 01/26/2014 and presents to discuss the results.   MEDICAL HISTORY: Past Medical History  Diagnosis Date  . Hypertension   . Diabetes mellitus without complication     Pre-diabetes  . Depression   . Headache(784.0)   . Thyroid disease     Hx: of  . Cancer 2015    cervical lymphadenopathy  . Hodgkin lymphoma 2015  . Hepatic artery aneurysm 2015    ALLERGIES:  has No Known Allergies.  MEDICATIONS:  Current Outpatient Prescriptions  Medication Sig Dispense Refill  . carvedilol (COREG) 6.25 MG tablet Take 1 tablet (6.25 mg total) by mouth 2 (two) times  daily. 60 tablet 11  . eszopiclone (LUNESTA) 2 MG TABS tablet Take 2 mg by mouth at bedtime as needed for sleep. Take immediately before bedtime    . fenofibrate (TRICOR) 145 MG tablet Take 1 tablet (145 mg total) by mouth daily. 30 tablet 6  . fexofenadine (ALLEGRA) 180 MG tablet Take 180 mg by mouth daily.    Marland Kitchen levothyroxine (SYNTHROID, LEVOTHROID) 200 MCG tablet Take 200 mcg by mouth daily before breakfast.    . lidocaine-prilocaine (EMLA) cream Apply 1 application topically as needed. Apply to port site one hour before treatment and cover with plastic wrap. 30 g 1  . lisinopril (PRINIVIL,ZESTRIL) 10 MG tablet Take 10 mg by mouth daily with breakfast.     . lovastatin (ALTOPREV) 40 MG 24 hr tablet Take 40 mg by mouth at bedtime.    . metFORMIN (GLUCOPHAGE) 500 MG tablet Take 500 mg by mouth daily.    . montelukast (SINGULAIR) 10 MG tablet Take 10 mg by mouth at bedtime.    . naproxen sodium (ANAPROX) 220 MG tablet Take 220 mg by mouth daily as needed (pain).    Marland Kitchen oxyCODONE (OXY IR/ROXICODONE) 5 MG immediate release tablet Take 1 tablet (5 mg total) by mouth every 6 (six) hours as needed for severe pain. 60 tablet 0  . potassium chloride (K-DUR,KLOR-CON) 10 MEQ tablet Take 10 mEq by mouth daily.     . sertraline (ZOLOFT) 100 MG tablet Take 100 mg by mouth daily with breakfast.      No current facility-administered medications for this visit.    SURGICAL  HISTORY:  Past Surgical History  Procedure Laterality Date  . Tonsillectomy    . Mass excision Left 12/31/2012    Procedure: CERVICAL LYMPH NODE BIOPSY;  Surgeon: [name], MD;  Location: [place];  Service: General;  Laterality: Left;    REVIEW OF SYSTEMS:  Constitutional: negative Eyes: negative Ears, nose, mouth, throat, and face: negative Respiratory: negative Cardiovascular: negative Gastrointestinal: negative Genitourinary:negative Integument/breast: negative Hematologic/lymphatic:  negative Musculoskeletal:negative Neurological: negative Behavioral/Psych: negative Endocrine: negative Allergic/Immunologic: negative   PHYSICAL EXAMINATION: General appearance: alert, cooperative and no distress Head: Normocephalic, without obvious abnormality, atraumatic Neck: no JVD, supple, symmetrical, trachea midline, thyroid not enlarged, symmetric, no tenderness/mass/nodules and Palpable small left lower cervical lymph node, firm to palpation Lymph nodes: Palpable small left lower cervical lymph node, firm to palpation Resp: clear to auscultation bilaterally Back: symmetric, no curvature. ROM normal. No CVA tenderness. Cardio: regular rate and rhythm, S1, S2 normal, no murmur, click, rub or gallop GI: soft, non-tender; bowel sounds normal; no masses,  no organomegaly Extremities: extremities normal, atraumatic, no cyanosis or edema Neurologic: Alert and oriented X 3, normal strength and tone. Normal symmetric reflexes. Normal coordination and gait  ECOG PERFORMANCE STATUS: 0 - Asymptomatic  Blood pressure 133/85, pulse 103, temperature 97.7 F (36.5 C), resp. rate 19, height 5' 10" (1.778 m), weight 228 lb 1.6 oz (103.465 kg), SpO2 100 %.  LABORATORY DATA: Lab Results  Component Value Date   WBC 7.7 01/11/2014   HGB 13.5 01/11/2014   HCT 40.4 01/11/2014   MCV 92.3 01/11/2014   PLT 310 01/11/2014      Chemistry      Component Value Date/Time   NA 136 01/11/2014 1242   NA 137 02/05/2013 0528   K 4.0 01/11/2014 1242   K 4.1 02/05/2013 0528   CL 98 02/05/2013 0528   CO2 26 01/11/2014 1242   CO2 28 02/05/2013 0528   BUN 12.4 01/11/2014 1242   BUN 14 02/05/2013 0528   CREATININE 1.3 01/11/2014 1242   CREATININE 0.99 02/05/2013 0528      Component Value Date/Time   CALCIUM 9.6 01/11/2014 1242   CALCIUM 9.0 02/05/2013 0528   ALKPHOS 56 01/11/2014 1242   ALKPHOS 97 02/05/2013 0528   AST 41* 01/11/2014 1242   AST 24 02/05/2013 0528   ALT 44 01/11/2014 1242   ALT  12 02/05/2013 0528   BILITOT 0.64 01/11/2014 1242   BILITOT 0.3 02/05/2013 0528       RADIOGRAPHIC STUDIES: Ct Soft Tissue Neck W Contrast  01/17/2014   CLINICAL DATA:  Hodgkin's lymphoma diagnosed 2015, chemotherapy complete. Restaging.  EXAM: CT OF THE NECK WITH CONTRAST  CT OF THE CHEST WITH CONTRAST  CT OF THE ABDOMEN AND PELVIS WITH CONTRAST  TECHNIQUE: Multidetector CT imaging of the neck was performed with intravenous contrast.; Multidetector CT imaging of the abdomen and pelvis was performed following the standard protocol during bolus administration of intravenous contrast.; Multidetector CT imaging of the chest was performed following the standard protocol during bolus administration of intravenous contrast.  CONTRAST:  83m OMNIPAQUE IOHEXOL 300 MG/ML SOLN, 1035mOMNIPAQUE IOHEXOL 300 MG/ML SOLN  COMPARISON:  PET CT 07/28/2013  FINDINGS: CT NECK FINDINGS  Enlarging left cervical and supraclavicular lymph nodes noted. Left supraclavicular node measures 16 mm in short axis diameter on image 99 compared with 10 mm previously. Left level 3 node on image 90 measures 9 mm in short axis diameter compare weight 9 mm previously. Level 3 lymph node on image 78  has a short axis diameter of 8 mm, stable. Numerous other enlarged supraclavicular nodes on image 93 were not present on prior study. Small scattered right cervical lymph nodes, none pathologically enlarged or changed.  Epiglottis and aryepiglottic folds are normal. Airways patent. Thyroid and submandibular glands are unremarkable. Parotid glands unremarkable. Orbital soft tissues are normal. Visualized paranasal sinuses and mastoids are clear.  CT CHEST FINDINGS  Enlarging left axillary lymph nodes. The largest index left axillary lymph node has a short axis diameter of 2.3 cm on image 12 compared with 8 mm previously. Enlarging mediastinal lymph nodes. Index prevascular lymph node has a short axis diameter of 11 mm compared with 5 mm previously. She  subcarinal adenopathy has a short axis diameter of 18 mm compared with 11 mm previously. Right paratracheal adenopathy has a short axis diameter of 19 mm compared with 12 mm previously. Right axillary adenopathy similar to prior study. Subpectoral lymph node has a short axis diameter of 13 mm compared with 15 mm previously.  Enlarging left hilar adenopathy with a lymph node measuring 15 mm on image 26, unable to measure previously.  Heart is normal size. Aorta is normal caliber. Coronary artery calcifications in the left anterior descending coronary artery. No confluent airspace opacities or pleural effusions.  Right chest wall Port-A-Cath remains in place. Chest wall soft tissues are unremarkable.  CT ABDOMEN AND PELVIS FINDINGS  Periaortic adenopathy again noted. Index distal left periaortic lymph node has a short axis diameter of 18 mm on image 80 compared with 17 mm previously. Left common iliac node has a short axis diameter of 22 mm compared with 19 mm previously. Other mildly enlarged periaortic lymph nodes appear stable. No mesenteric adenopathy. No pelvic sidewall or inguinal adenopathy.  Large partially calcified mass in the porta hepatis is stable compatible with known hepatic artery aneurysm. Diffuse fatty infiltration of the liver noted without focal abnormality. Gallbladder, spleen, pancreas, adrenals and kidneys are normal. Aorta is normal caliber.  Normal retrocecal appendix. Stomach is unremarkable. Moderate stool throughout the colon. Small bowel is decompressed. No free fluid, free air or adenopathy. Urinary bladder and prostate are grossly unremarkable.  No acute bony abnormality or focal bone lesion.  IMPRESSION: Worsening adenopathy in the neck, particularly the left supraclavicular area.  Worsening adenopathy in the chest including left axilla, mediastinum, left hilum. Right axillary adenopathy appear stable.  Mildly worsening retroperitoneal/periaortic adenopathy.  Diffuse fatty infiltration  of the liver.   Electronically Signed   By: Rolm Baptise M.D.   On: 01/17/2014 17:37   Ct Chest W Contrast  01/17/2014   CLINICAL DATA:  Hodgkin's lymphoma diagnosed 2015, chemotherapy complete. Restaging.  EXAM: CT OF THE NECK WITH CONTRAST  CT OF THE CHEST WITH CONTRAST  CT OF THE ABDOMEN AND PELVIS WITH CONTRAST  TECHNIQUE: Multidetector CT imaging of the neck was performed with intravenous contrast.; Multidetector CT imaging of the abdomen and pelvis was performed following the standard protocol during bolus administration of intravenous contrast.; Multidetector CT imaging of the chest was performed following the standard protocol during bolus administration of intravenous contrast.  CONTRAST:  58m OMNIPAQUE IOHEXOL 300 MG/ML SOLN, 1074mOMNIPAQUE IOHEXOL 300 MG/ML SOLN  COMPARISON:  PET CT 07/28/2013  FINDINGS: CT NECK FINDINGS  Enlarging left cervical and supraclavicular lymph nodes noted. Left supraclavicular node measures 16 mm in short axis diameter on image 99 compared with 10 mm previously. Left level 3 node on image 90 measures 9 mm in short axis diameter compare weight  9 mm previously. Level 3 lymph node on image 78 has a short axis diameter of 8 mm, stable. Numerous other enlarged supraclavicular nodes on image 93 were not present on prior study. Small scattered right cervical lymph nodes, none pathologically enlarged or changed.  Epiglottis and aryepiglottic folds are normal. Airways patent. Thyroid and submandibular glands are unremarkable. Parotid glands unremarkable. Orbital soft tissues are normal. Visualized paranasal sinuses and mastoids are clear.  CT CHEST FINDINGS  Enlarging left axillary lymph nodes. The largest index left axillary lymph node has a short axis diameter of 2.3 cm on image 12 compared with 8 mm previously. Enlarging mediastinal lymph nodes. Index prevascular lymph node has a short axis diameter of 11 mm compared with 5 mm previously. She subcarinal adenopathy has a short  axis diameter of 18 mm compared with 11 mm previously. Right paratracheal adenopathy has a short axis diameter of 19 mm compared with 12 mm previously. Right axillary adenopathy similar to prior study. Subpectoral lymph node has a short axis diameter of 13 mm compared with 15 mm previously.  Enlarging left hilar adenopathy with a lymph node measuring 15 mm on image 26, unable to measure previously.  Heart is normal size. Aorta is normal caliber. Coronary artery calcifications in the left anterior descending coronary artery. No confluent airspace opacities or pleural effusions.  Right chest wall Port-A-Cath remains in place. Chest wall soft tissues are unremarkable.  CT ABDOMEN AND PELVIS FINDINGS  Periaortic adenopathy again noted. Index distal left periaortic lymph node has a short axis diameter of 18 mm on image 80 compared with 17 mm previously. Left common iliac node has a short axis diameter of 22 mm compared with 19 mm previously. Other mildly enlarged periaortic lymph nodes appear stable. No mesenteric adenopathy. No pelvic sidewall or inguinal adenopathy.  Large partially calcified mass in the porta hepatis is stable compatible with known hepatic artery aneurysm. Diffuse fatty infiltration of the liver noted without focal abnormality. Gallbladder, spleen, pancreas, adrenals and kidneys are normal. Aorta is normal caliber.  Normal retrocecal appendix. Stomach is unremarkable. Moderate stool throughout the colon. Small bowel is decompressed. No free fluid, free air or adenopathy. Urinary bladder and prostate are grossly unremarkable.  No acute bony abnormality or focal bone lesion.  IMPRESSION: Worsening adenopathy in the neck, particularly the left supraclavicular area.  Worsening adenopathy in the chest including left axilla, mediastinum, left hilum. Right axillary adenopathy appear stable.  Mildly worsening retroperitoneal/periaortic adenopathy.  Diffuse fatty infiltration of the liver.   Electronically  Signed   By: Rolm Baptise M.D.   On: 01/17/2014 17:37   Ct Abdomen Pelvis W Contrast  01/17/2014   CLINICAL DATA:  Hodgkin's lymphoma diagnosed 2015, chemotherapy complete. Restaging.  EXAM: CT OF THE NECK WITH CONTRAST  CT OF THE CHEST WITH CONTRAST  CT OF THE ABDOMEN AND PELVIS WITH CONTRAST  TECHNIQUE: Multidetector CT imaging of the neck was performed with intravenous contrast.; Multidetector CT imaging of the abdomen and pelvis was performed following the standard protocol during bolus administration of intravenous contrast.; Multidetector CT imaging of the chest was performed following the standard protocol during bolus administration of intravenous contrast.  CONTRAST:  25m OMNIPAQUE IOHEXOL 300 MG/ML SOLN, 101mOMNIPAQUE IOHEXOL 300 MG/ML SOLN  COMPARISON:  PET CT 07/28/2013  FINDINGS: CT NECK FINDINGS  Enlarging left cervical and supraclavicular lymph nodes noted. Left supraclavicular node measures 16 mm in short axis diameter on image 99 compared with 10 mm previously. Left level 3 node on  image 90 measures 9 mm in short axis diameter compare weight 9 mm previously. Level 3 lymph node on image 78 has a short axis diameter of 8 mm, stable. Numerous other enlarged supraclavicular nodes on image 93 were not present on prior study. Small scattered right cervical lymph nodes, none pathologically enlarged or changed.  Epiglottis and aryepiglottic folds are normal. Airways patent. Thyroid and submandibular glands are unremarkable. Parotid glands unremarkable. Orbital soft tissues are normal. Visualized paranasal sinuses and mastoids are clear.  CT CHEST FINDINGS  Enlarging left axillary lymph nodes. The largest index left axillary lymph node has a short axis diameter of 2.3 cm on image 12 compared with 8 mm previously. Enlarging mediastinal lymph nodes. Index prevascular lymph node has a short axis diameter of 11 mm compared with 5 mm previously. She subcarinal adenopathy has a short axis diameter of 18 mm  compared with 11 mm previously. Right paratracheal adenopathy has a short axis diameter of 19 mm compared with 12 mm previously. Right axillary adenopathy similar to prior study. Subpectoral lymph node has a short axis diameter of 13 mm compared with 15 mm previously.  Enlarging left hilar adenopathy with a lymph node measuring 15 mm on image 26, unable to measure previously.  Heart is normal size. Aorta is normal caliber. Coronary artery calcifications in the left anterior descending coronary artery. No confluent airspace opacities or pleural effusions.  Right chest wall Port-A-Cath remains in place. Chest wall soft tissues are unremarkable.  CT ABDOMEN AND PELVIS FINDINGS  Periaortic adenopathy again noted. Index distal left periaortic lymph node has a short axis diameter of 18 mm on image 80 compared with 17 mm previously. Left common iliac node has a short axis diameter of 22 mm compared with 19 mm previously. Other mildly enlarged periaortic lymph nodes appear stable. No mesenteric adenopathy. No pelvic sidewall or inguinal adenopathy.  Large partially calcified mass in the porta hepatis is stable compatible with known hepatic artery aneurysm. Diffuse fatty infiltration of the liver noted without focal abnormality. Gallbladder, spleen, pancreas, adrenals and kidneys are normal. Aorta is normal caliber.  Normal retrocecal appendix. Stomach is unremarkable. Moderate stool throughout the colon. Small bowel is decompressed. No free fluid, free air or adenopathy. Urinary bladder and prostate are grossly unremarkable.  No acute bony abnormality or focal bone lesion.  IMPRESSION: Worsening adenopathy in the neck, particularly the left supraclavicular area.  Worsening adenopathy in the chest including left axilla, mediastinum, left hilum. Right axillary adenopathy appear stable.  Mildly worsening retroperitoneal/periaortic adenopathy.  Diffuse fatty infiltration of the liver.   Electronically Signed   By: Rolm Baptise  M.D.   On: 01/17/2014 17:37   Nm Pet Image Restag (ps) Skull Base To Thigh  01/26/2014   CLINICAL DATA:  Initial treatment strategy for restaging of Hodgkin's lymphoma.  EXAM: NUCLEAR MEDICINE PET SKULL BASE TO THIGH  TECHNIQUE: 11.1 mCi F-18 FDG was injected intravenously. Full-ring PET imaging was performed from the skull base to thigh after the radiotracer. CT data was obtained and used for attenuation correction and anatomic localization.  FASTING BLOOD GLUCOSE:  Value: 177 mg/dl  COMPARISON:  07/28/2013.  Diagnostic CTs of 01/17/2014.  FINDINGS: NECK  Hypermetabolic cervical adenopathy. Index left low jugular node measures 1.5 cm and a S.U.V. max of 5.1. (Deauville 4) This node is enlarged and increased in hypermetabolism. Measured 9 mm and a S.U.V. max of 1.2 on the prior.  CHEST  Hypermetabolic bilateral axillary adenopathy. Index left axillary node measures 2.0  cm and a S.U.V. max of 8.9 (Deauville 4). These nodes are enlarged and increased in hypermetabolism since the prior exam. Hypermetabolic mediastinal nodes are also identified.  ABDOMEN/PELVIS  Index left low periaortic node measures 2.1 cm and a S.U.V. max of 9.6 . On the prior exam, this measured 1.7 cm and a S.U.V. max of 2.1.  SKELETON  No abnormal marrow activity.  CT IMAGES PERFORMED FOR ATTENUATION CORRECTION  Findings which are deferred to recent diagnostic CTs of the neck, chest, abdomen, and pelvis. LAD coronary artery atherosclerosis. Hepatic steatosis. Calcified mass in the porta hepatis which may represent a remote pseudocyst or postoperative hematoma (surgical clips in this region).  IMPRESSION: 1. Since 07/28/2013, progression of lymphoma within the neck, chest, abdomen/pelvis. 2. Hepatic steatosis. 3. Age advanced coronary artery atherosclerosis. Recommend assessment of coronary risk factors and consideration of medical therapy.   Electronically Signed   By: Abigail Miyamoto M.D.   On: 01/26/2014 10:18    ASSESSMENT AND PLAN: This is  a very pleasant 49 years old white male with history of stage III Hodgkin's lymphoma status post 6 cycles of systemic chemotherapy with ABVD with almost complete response. He has been observation since July 2015 but the patient has a palpable left lower cervical lymph node concerning for residual disease. The restaging PET scan revealed evidence for disease progression of lymphoma within the neck, chest abdomen and pelvis. Patient was discussed with and also seen by Dr. Julien Nordmann. The plan will be to refer the patient to [facility] for evaluation for a topical autologous stem cell transplant. He will be referred to Dr. Ok Edwards for one of his partners. With specific recommendations for the type and number of cycles of chemotherapy prior to transplant. The patient is in agreement with this plan. We will plan to see him back in our office in approximately 3-3 and half weeks with repeat CBC differential C met and LDH. He will continue to have Port-A-Cath flush every 2 months. For the history of pulmonary fibrosis, the patient will continue his routine follow-up visit with Dr. Lake Bells He was advised to call immediately if he has any concerning symptoms in the interval. The patient voices understanding of current disease status and treatment options and is in agreement with the current care plan.  All questions were answered. The patient knows to call the clinic with any problems, questions or concerns. We can certainly see the patient much sooner if necessary.  Carlton Adam, PA-C 02/01/2014  ADDENDUM: Hematology/Oncology Attending: I had a face to face encounter with the patient today. I recommended his care plan. This is a very pleasant 49 years old white male with history of stage III classical Hodgkin's lymphoma, mixed cellularity status post 6 cycles of systemic chemotherapy with ABVD completed in July 2015. This was under the care of Dr. Juliann Mule. I saw the patient 2 weeks ago to establish care with me  and there was palpable lymphadenopathy in the left supraclavicular area in addition to evidence for mediastinal and left axillary lymphadenopathy on the recent CT scan of the chest, abdomen and pelvis. I recommended for the patient to have a PET scan performed which was done recently and unfortunately showed hypermetabolic activity in the left supraclavicular, mediastinal as well as left axillary lymphadenopathy highly suspicious for disease recurrence. I had a lengthy discussion with the patient today about these findings. I recommended for him to see Dr. Ok Edwards at [facility] for evaluation and discussion of autologous peripheral blood stem  cell transplant and also for recommendation regarding the conditioning systemic chemotherapy before the transplant. I think the patient would benefit from 2 cycles of treatment with ICE before consideration of his stem cell transplant. I would see him back for follow-up visit in 3 weeks for reevaluation and hopefully after his consultation with Dr. Ok Edwards. He was advised to call immediately if he has any concerning symptoms in the interval.  Disclaimer: This note was dictated with voice recognition software. Similar sounding words can inadvertently be transcribed and may not be corrected upon review. Eilleen Kempf., MD 01/20.2016

## 2014-02-07 ENCOUNTER — Encounter: Payer: Self-pay | Admitting: Internal Medicine

## 2014-02-08 ENCOUNTER — Telehealth: Payer: Self-pay | Admitting: Medical Oncology

## 2014-02-08 NOTE — Telephone Encounter (Signed)
I received a referral form from [facility] and gave to [place] in HIM to please refer pt to dr Ok Edwards.

## 2014-02-08 NOTE — Telephone Encounter (Signed)
I left a message for [name] [name] to return my call.

## 2014-02-09 ENCOUNTER — Telehealth: Payer: Self-pay | Admitting: Internal Medicine

## 2014-02-09 ENCOUNTER — Telehealth: Payer: Self-pay | Admitting: Medical Oncology

## 2014-02-09 NOTE — Telephone Encounter (Signed)
FAXED PT [place].  WAITING FOR APPT.

## 2014-02-09 NOTE — Telephone Encounter (Signed)
I confirmed with [name] [name] in HIM that referral was made. She said she sent records and is waiting for appt . I notified Pt.

## 2014-02-10 ENCOUNTER — Telehealth: Payer: Self-pay | Admitting: Internal Medicine

## 2014-02-10 NOTE — Telephone Encounter (Signed)
Pt appt with Dr. Evette Doffing @ [facility] is 02/15/14@ 2:00. Medical records faxed. Slides and scans will be fedex'ed. Pt is aware

## 2014-02-16 ENCOUNTER — Encounter: Payer: Self-pay | Admitting: Internal Medicine

## 2014-02-17 ENCOUNTER — Telehealth: Payer: Self-pay | Admitting: Medical Oncology

## 2014-02-17 NOTE — Telephone Encounter (Signed)
Pt notified re keeping appt 2/15. Per Julien Nordmann this is okay.

## 2014-02-26 ENCOUNTER — Other Ambulatory Visit: Payer: Self-pay | Admitting: Cardiology

## 2014-02-27 ENCOUNTER — Ambulatory Visit (HOSPITAL_BASED_OUTPATIENT_CLINIC_OR_DEPARTMENT_OTHER): Payer: Commercial Managed Care - PPO | Admitting: Internal Medicine

## 2014-02-27 ENCOUNTER — Telehealth: Payer: Self-pay | Admitting: Internal Medicine

## 2014-02-27 ENCOUNTER — Encounter: Payer: Self-pay | Admitting: Internal Medicine

## 2014-02-27 ENCOUNTER — Other Ambulatory Visit (HOSPITAL_BASED_OUTPATIENT_CLINIC_OR_DEPARTMENT_OTHER): Payer: Commercial Managed Care - PPO

## 2014-02-27 VITALS — BP 119/83 | HR 85 | Temp 98.0°F | Resp 19 | Ht 70.0 in | Wt 231.2 lb

## 2014-02-27 DIAGNOSIS — C8190 Hodgkin lymphoma, unspecified, unspecified site: Secondary | ICD-10-CM

## 2014-02-27 DIAGNOSIS — C8120 Mixed cellularity classical Hodgkin lymphoma, unspecified site: Secondary | ICD-10-CM

## 2014-02-27 LAB — CBC WITH DIFFERENTIAL/PLATELET
BASO%: 0.6 % (ref 0.0–2.0)
BASOS ABS: 0 10*3/uL (ref 0.0–0.1)
EOS%: 3.5 % (ref 0.0–7.0)
Eosinophils Absolute: 0.3 10*3/uL (ref 0.0–0.5)
HEMATOCRIT: 41.1 % (ref 38.4–49.9)
HGB: 14.2 g/dL (ref 13.0–17.1)
LYMPH%: 36 % (ref 14.0–49.0)
MCH: 32.3 pg (ref 27.2–33.4)
MCHC: 34.5 g/dL (ref 32.0–36.0)
MCV: 93.6 fL (ref 79.3–98.0)
MONO#: 0.5 10*3/uL (ref 0.1–0.9)
MONO%: 7.1 % (ref 0.0–14.0)
NEUT#: 3.8 10*3/uL (ref 1.5–6.5)
NEUT%: 52.8 % (ref 39.0–75.0)
Platelets: 251 10*3/uL (ref 140–400)
RBC: 4.39 10*6/uL (ref 4.20–5.82)
RDW: 13.5 % (ref 11.0–14.6)
WBC: 7.2 10*3/uL (ref 4.0–10.3)
lymph#: 2.6 10*3/uL (ref 0.9–3.3)
nRBC: 0 % (ref 0–0)

## 2014-02-27 LAB — COMPREHENSIVE METABOLIC PANEL (CC13)
ALT: 41 U/L (ref 0–55)
AST: 36 U/L — ABNORMAL HIGH (ref 5–34)
Albumin: 4.2 g/dL (ref 3.5–5.0)
Alkaline Phosphatase: 68 U/L (ref 40–150)
Anion Gap: 12 mEq/L — ABNORMAL HIGH (ref 3–11)
BUN: 16.5 mg/dL (ref 7.0–26.0)
CO2: 23 meq/L (ref 22–29)
CREATININE: 1.3 mg/dL (ref 0.7–1.3)
Calcium: 9.3 mg/dL (ref 8.4–10.4)
Chloride: 102 mEq/L (ref 98–109)
EGFR: 67 mL/min/{1.73_m2} — AB (ref >90)
GLUCOSE: 150 mg/dL — AB (ref 70–140)
Potassium: 4.6 mEq/L (ref 3.5–5.1)
SODIUM: 137 meq/L (ref 136–145)
Total Bilirubin: 0.43 mg/dL (ref 0.20–1.20)
Total Protein: 8 g/dL (ref 6.4–8.3)

## 2014-02-27 LAB — LACTATE DEHYDROGENASE (CC13): LDH: 277 U/L — AB (ref 125–245)

## 2014-02-27 NOTE — Telephone Encounter (Signed)
Pt confirmed labs/ov per 02/15 POF, gave pt AVS..... KJ, sent msg to add chemo °

## 2014-02-27 NOTE — Progress Notes (Signed)
[place] [phone]   [phone]  OFFICE PROGRESS NOTE  Pcp Not In System No address on file  DIAGNOSIS: Recurrent Hodgkin's lymphoma initially diagnosed as Stage III classical Hodgkin's lymphoma, mixed cellularity subtype diagnosed in December 2014.  PRIOR THERAPY: Status post 6 cycles of systemic chemotherapy with ABVD complicated with pulmonary fibrosis, last dose was given 07/14/2013  CURRENT THERAPY: Salvage chemotherapy with ICE (ifosfamide/mesna, carboplatin and etoposide) first dose expected 03/06/2014.  INTERVAL HISTORY: [name] [name] 49 y.o. male returns to the clinic today for follow-up visit. The patient on previous examination was felt to have enlargement left lower cervical and supraclavicular lymphadenopathy. CT scan of the neck, chest, abdomen and pelvis followed by a PET scan on 01/26/2014 showed progression of lymphoma within the neck, chest, abdomen and pelvis. The patient was referred to [facility] and was seen by Dr. Evette Doffing for evaluation of autologous peripheral blood stem cell transplant. Dr. Evette Doffing agreed with our recommendation regarding proceeding with salvage therapy with ICE for at least 2 cycles before considering him for transplant. The patient is here today for evaluation and discussion of his treatment options. He is feeling fine today was no specific complaints. He is a little bit anxious. He denied having any significant weight loss or night sweats. The patient denied having any chest pain, shortness of breath, cough or hemoptysis. He has no nausea or vomiting.  MEDICAL HISTORY: Past Medical History  Diagnosis Date  . Hypertension   . Diabetes mellitus without complication     Pre-diabetes  . Depression   . Headache(784.0)   . Thyroid disease     Hx: of  . Cancer 2015    cervical lymphadenopathy  . Hodgkin lymphoma 2015  . Hepatic artery aneurysm 2015    ALLERGIES:  has No Known  Allergies.  MEDICATIONS:  Current Outpatient Prescriptions  Medication Sig Dispense Refill  . carvedilol (COREG) 6.25 MG tablet Take 1 tablet (6.25 mg total) by mouth 2 (two) times daily. 60 tablet 11  . eszopiclone (LUNESTA) 2 MG TABS tablet Take 2 mg by mouth at bedtime as needed for sleep. Take immediately before bedtime    . fenofibrate (TRICOR) 145 MG tablet Take 1 tablet (145 mg total) by mouth daily. 30 tablet 6  . fexofenadine (ALLEGRA) 180 MG tablet Take 180 mg by mouth daily.    Marland Kitchen levothyroxine (SYNTHROID, LEVOTHROID) 200 MCG tablet Take 200 mcg by mouth daily before breakfast.    . lidocaine-prilocaine (EMLA) cream Apply 1 application topically as needed. Apply to port site one hour before treatment and cover with plastic wrap. 30 g 1  . lisinopril (PRINIVIL,ZESTRIL) 10 MG tablet Take 10 mg by mouth daily with breakfast.     . lovastatin (ALTOPREV) 40 MG 24 hr tablet Take 40 mg by mouth at bedtime.    . metFORMIN (GLUCOPHAGE) 500 MG tablet Take 500 mg by mouth daily.    . montelukast (SINGULAIR) 10 MG tablet Take 10 mg by mouth at bedtime.    . naproxen sodium (ANAPROX) 220 MG tablet Take 220 mg by mouth daily as needed (pain).    Marland Kitchen oxyCODONE (OXY IR/ROXICODONE) 5 MG immediate release tablet Take 1 tablet (5 mg total) by mouth every 6 (six) hours as needed for severe pain. 60 tablet 0  . potassium chloride (K-DUR,KLOR-CON) 10 MEQ tablet Take 10 mEq by mouth daily.     . sertraline (ZOLOFT) 100 MG tablet Take 100 mg by  mouth daily with breakfast.      No current facility-administered medications for this visit.    SURGICAL HISTORY:  Past Surgical History  Procedure Laterality Date  . Tonsillectomy    . Mass excision Left 12/31/2012    Procedure: CERVICAL LYMPH NODE BIOPSY;  Surgeon: [name], MD;  Location: [place];  Service: General;  Laterality: Left;    REVIEW OF SYSTEMS:  Constitutional: negative Eyes: negative Ears, nose, mouth, throat, and face:  negative Respiratory: negative Cardiovascular: negative Gastrointestinal: negative Genitourinary:negative Integument/breast: negative Hematologic/lymphatic: negative Musculoskeletal:negative Neurological: negative Behavioral/Psych: negative Endocrine: negative Allergic/Immunologic: negative   PHYSICAL EXAMINATION: General appearance: alert, cooperative and no distress Head: Normocephalic, without obvious abnormality, atraumatic Neck: no JVD, supple, symmetrical, trachea midline, thyroid not enlarged, symmetric, no tenderness/mass/nodules and Palpable small left lower cervical lymph node, firm to palpation Lymph nodes: Palpable small left lower cervical lymph node, firm to palpation Resp: clear to auscultation bilaterally Back: symmetric, no curvature. ROM normal. No CVA tenderness. Cardio: regular rate and rhythm, S1, S2 normal, no murmur, click, rub or gallop GI: soft, non-tender; bowel sounds normal; no masses,  no organomegaly Extremities: extremities normal, atraumatic, no cyanosis or edema Neurologic: Alert and oriented X 3, normal strength and tone. Normal symmetric reflexes. Normal coordination and gait  ECOG PERFORMANCE STATUS: 0 - Asymptomatic  Blood pressure 119/83, pulse 85, temperature 98 F (36.7 C), temperature source Oral, resp. rate 19, height 5\' 10"  (1.778 m), weight 231 lb 3.2 oz (104.872 kg), SpO2 95 %.  LABORATORY DATA: Lab Results  Component Value Date   WBC 7.2 02/27/2014   HGB 14.2 02/27/2014   HCT 41.1 02/27/2014   MCV 93.6 02/27/2014   PLT 251 02/27/2014      Chemistry      Component Value Date/Time   NA 136 01/11/2014 1242   NA 137 02/05/2013 0528   K 4.0 01/11/2014 1242   K 4.1 02/05/2013 0528   CL 98 02/05/2013 0528   CO2 26 01/11/2014 1242   CO2 28 02/05/2013 0528   BUN 12.4 01/11/2014 1242   BUN 14 02/05/2013 0528   CREATININE 1.3 01/11/2014 1242   CREATININE 0.99 02/05/2013 0528      Component Value Date/Time   CALCIUM 9.6  01/11/2014 1242   CALCIUM 9.0 02/05/2013 0528   ALKPHOS 56 01/11/2014 1242   ALKPHOS 97 02/05/2013 0528   AST 41* 01/11/2014 1242   AST 24 02/05/2013 0528   ALT 44 01/11/2014 1242   ALT 12 02/05/2013 0528   BILITOT 0.64 01/11/2014 1242   BILITOT 0.3 02/05/2013 0528       RADIOGRAPHIC STUDIES: Nm Pet Image Restag (ps) Skull Base To Thigh  01/26/2014   CLINICAL DATA:  Initial treatment strategy for restaging of Hodgkin's lymphoma.  EXAM: NUCLEAR MEDICINE PET SKULL BASE TO THIGH  TECHNIQUE: 11.1 mCi F-18 FDG was injected intravenously. Full-ring PET imaging was performed from the skull base to thigh after the radiotracer. CT data was obtained and used for attenuation correction and anatomic localization.  FASTING BLOOD GLUCOSE:  Value: 177 mg/dl  COMPARISON:  07/28/2013.  Diagnostic CTs of 01/17/2014.  FINDINGS: NECK  Hypermetabolic cervical adenopathy. Index left low jugular node measures 1.5 cm and a S.U.V. max of 5.1. (Deau[place] 4) This node is enlarged and increased in hypermetabolism. Measured 9 mm and a S.U.V. max of 1.2 on the prior.  CHEST  Hypermetabolic bilateral axillary adenopathy. Index left axillary node measures 2.0 cm and a S.U.V. max of 8.9 (Deauville 4). These  nodes are enlarged and increased in hypermetabolism since the prior exam. Hypermetabolic mediastinal nodes are also identified.  ABDOMEN/PELVIS  Index left low periaortic node measures 2.1 cm and a S.U.V. max of 9.6 . On the prior exam, this measured 1.7 cm and a S.U.V. max of 2.1.  SKELETON  No abnormal marrow activity.  CT IMAGES PERFORMED FOR ATTENUATION CORRECTION  Findings which are deferred to recent diagnostic CTs of the neck, chest, abdomen, and pelvis. LAD coronary artery atherosclerosis. Hepatic steatosis. Calcified mass in the porta hepatis which may represent a remote pseudocyst or postoperative hematoma (surgical clips in this region).  IMPRESSION: 1. Since 07/28/2013, progression of lymphoma within the neck, chest,  abdomen/pelvis. 2. Hepatic steatosis. 3. Age advanced coronary artery atherosclerosis. Recommend assessment of coronary risk factors and consideration of medical therapy.   Electronically Signed   By: Abigail Miyamoto M.D.   On: 01/26/2014 10:18   ASSESSMENT AND PLAN: This is a very pleasant 49 years old white male with history of stage III Hodgkin's lymphoma status post 6 cycles of systemic chemotherapy with ABVD with almost complete response. Unfortunately the recent imaging studies as well as physical exam showed evidence for disease recurrence. I had a lengthy discussion with the patient today about his current condition and treatment options. I recommended for him to proceed with the salvage chemotherapy with ICE for at least 2 cycles before reevaluation with imaging studies and consideration of stem cell transplant if he has complete response. I discussed with the patient adverse effect of the chemotherapy including but not limited to alopecia, myelosuppression, nausea and vomiting, peripheral neuropathy, liver or renal dysfunction as well as neurological abnormalities from the ifosfamide. He is expected to start the first cycle of this treatment on 03/06/2014 at [facility] We will continue to monitor him closely with weekly lab work. The patient would come back for follow-up visit in 2 weeks for reevaluation. For the history of pulmonary fibrosis, the patient will continue his routine follow-up visit with Dr. Lake Bells He was advised to call immediately if he has any concerning symptoms in the interval. The patient voices understanding of current disease status and treatment options and is in agreement with the current care plan.  All questions were answered. The patient knows to call the clinic with any problems, questions or concerns. We can certainly see the patient much sooner if necessary.  I spent 15 minutes counseling the patient face to face. The total time spent in the appointment  was 25 minutes.  Disclaimer: This note was dictated with voice recognition software. Similar sounding words can inadvertently be transcribed and may not be corrected upon review.

## 2014-03-01 NOTE — Addendum Note (Signed)
Addended by: Ardeen Garland on: 03/01/2014 03:47 PM   Modules accepted: Medications

## 2014-03-06 ENCOUNTER — Encounter (HOSPITAL_COMMUNITY): Payer: Self-pay

## 2014-03-06 ENCOUNTER — Inpatient Hospital Stay (HOSPITAL_COMMUNITY)
Admission: AD | Admit: 2014-03-06 | Discharge: 2014-03-09 | DRG: 847 | Disposition: A | Discharge status: Home / Self Care | Payer: Commercial Managed Care - PPO | Source: Ambulatory Visit | Attending: Internal Medicine | Admitting: Internal Medicine

## 2014-03-06 ENCOUNTER — Telehealth: Payer: Self-pay | Admitting: Medical Oncology

## 2014-03-06 ENCOUNTER — Other Ambulatory Visit: Payer: Self-pay | Admitting: Physician Assistant

## 2014-03-06 DIAGNOSIS — I427 Cardiomyopathy due to drug and external agent: Secondary | ICD-10-CM

## 2014-03-06 DIAGNOSIS — T451X5A Adverse effect of antineoplastic and immunosuppressive drugs, initial encounter: Secondary | ICD-10-CM

## 2014-03-06 DIAGNOSIS — R110 Nausea: Secondary | ICD-10-CM

## 2014-03-06 DIAGNOSIS — K30 Functional dyspepsia: Secondary | ICD-10-CM

## 2014-03-06 DIAGNOSIS — Z9221 Personal history of antineoplastic chemotherapy: Secondary | ICD-10-CM

## 2014-03-06 DIAGNOSIS — Z5112 Encounter for antineoplastic immunotherapy: Secondary | ICD-10-CM

## 2014-03-06 DIAGNOSIS — R0602 Shortness of breath: Secondary | ICD-10-CM

## 2014-03-06 DIAGNOSIS — Z8249 Family history of ischemic heart disease and other diseases of the circulatory system: Secondary | ICD-10-CM

## 2014-03-06 DIAGNOSIS — F32A Depression, unspecified: Secondary | ICD-10-CM

## 2014-03-06 DIAGNOSIS — G893 Neoplasm related pain (acute) (chronic): Secondary | ICD-10-CM

## 2014-03-06 DIAGNOSIS — Z79899 Other long term (current) drug therapy: Secondary | ICD-10-CM

## 2014-03-06 DIAGNOSIS — Z807 Family history of other malignant neoplasms of lymphoid, hematopoietic and related tissues: Secondary | ICD-10-CM | POA: Diagnosis not present

## 2014-03-06 DIAGNOSIS — F5104 Psychophysiologic insomnia: Secondary | ICD-10-CM | POA: Diagnosis present

## 2014-03-06 DIAGNOSIS — E860 Dehydration: Secondary | ICD-10-CM

## 2014-03-06 DIAGNOSIS — K5900 Constipation, unspecified: Secondary | ICD-10-CM

## 2014-03-06 DIAGNOSIS — Z5111 Encounter for antineoplastic chemotherapy: Secondary | ICD-10-CM | POA: Diagnosis present

## 2014-03-06 DIAGNOSIS — Z79891 Long term (current) use of opiate analgesic: Secondary | ICD-10-CM

## 2014-03-06 DIAGNOSIS — Z833 Family history of diabetes mellitus: Secondary | ICD-10-CM

## 2014-03-06 DIAGNOSIS — R590 Localized enlarged lymph nodes: Secondary | ICD-10-CM | POA: Diagnosis present

## 2014-03-06 DIAGNOSIS — E876 Hypokalemia: Secondary | ICD-10-CM

## 2014-03-06 DIAGNOSIS — E119 Type 2 diabetes mellitus without complications: Secondary | ICD-10-CM | POA: Diagnosis present

## 2014-03-06 DIAGNOSIS — G4700 Insomnia, unspecified: Secondary | ICD-10-CM

## 2014-03-06 DIAGNOSIS — E039 Hypothyroidism, unspecified: Secondary | ICD-10-CM

## 2014-03-06 DIAGNOSIS — I10 Essential (primary) hypertension: Secondary | ICD-10-CM | POA: Diagnosis present

## 2014-03-06 DIAGNOSIS — Z87891 Personal history of nicotine dependence: Secondary | ICD-10-CM

## 2014-03-06 DIAGNOSIS — C8128 Mixed cellularity classical Hodgkin lymphoma, lymph nodes of multiple sites: Secondary | ICD-10-CM

## 2014-03-06 DIAGNOSIS — C8190 Hodgkin lymphoma, unspecified, unspecified site: Secondary | ICD-10-CM | POA: Diagnosis present

## 2014-03-06 DIAGNOSIS — E785 Hyperlipidemia, unspecified: Secondary | ICD-10-CM

## 2014-03-06 DIAGNOSIS — F329 Major depressive disorder, single episode, unspecified: Secondary | ICD-10-CM | POA: Diagnosis present

## 2014-03-06 DIAGNOSIS — C8120 Mixed cellularity classical Hodgkin lymphoma, unspecified site: Secondary | ICD-10-CM

## 2014-03-06 DIAGNOSIS — R1084 Generalized abdominal pain: Secondary | ICD-10-CM

## 2014-03-06 DIAGNOSIS — Z9189 Other specified personal risk factors, not elsewhere classified: Secondary | ICD-10-CM

## 2014-03-06 DIAGNOSIS — I5189 Other ill-defined heart diseases: Secondary | ICD-10-CM | POA: Diagnosis present

## 2014-03-06 DIAGNOSIS — C8590 Non-Hodgkin lymphoma, unspecified, unspecified site: Secondary | ICD-10-CM | POA: Diagnosis present

## 2014-03-06 LAB — COMPREHENSIVE METABOLIC PANEL
ALBUMIN: 4.1 g/dL (ref 3.5–5.2)
ALK PHOS: 56 U/L (ref 39–117)
ALT: 41 U/L (ref 0–53)
ANION GAP: 8 (ref 5–15)
AST: 37 U/L (ref 0–37)
BILIRUBIN TOTAL: 0.9 mg/dL (ref 0.3–1.2)
BUN: 20 mg/dL (ref 6–23)
CO2: 28 mmol/L (ref 19–32)
CREATININE: 1.13 mg/dL (ref 0.50–1.35)
Calcium: 9.4 mg/dL (ref 8.4–10.5)
Chloride: 99 mmol/L (ref 96–112)
GFR calc Af Amer: 87 mL/min — ABNORMAL LOW (ref >90)
GFR calc non Af Amer: 75 mL/min — ABNORMAL LOW (ref >90)
GLUCOSE: 177 mg/dL — AB (ref 70–99)
Potassium: 3.9 mmol/L (ref 3.5–5.1)
Sodium: 135 mmol/L (ref 135–145)
Total Protein: 8 g/dL (ref 6.0–8.3)

## 2014-03-06 LAB — URIC ACID: URIC ACID, SERUM: 6.3 mg/dL (ref 4.0–7.8)

## 2014-03-06 MED ORDER — SODIUM CHLORIDE 0.9 % IV SOLN
100.0000 mg/m2 | INTRAVENOUS | Status: AC
  Administered 2014-03-06 – 2014-03-08 (×3): 230 mg via INTRAVENOUS
  Filled 2014-03-06 (×5): qty 11.5

## 2014-03-06 MED ORDER — LIDOCAINE-PRILOCAINE 2.5-2.5 % EX CREA: 1.0000 "application " | TOPICAL_CREAM | CUTANEOUS | Status: DC | PRN

## 2014-03-06 MED ORDER — ALUM & MAG HYDROXIDE-SIMETH 200-200-20 MG/5ML PO SUSP: 60.0000 mL | ORAL | Status: DC | PRN

## 2014-03-06 MED ORDER — LISINOPRIL 10 MG PO TABS
10.0000 mg | ORAL_TABLET | Freq: Every day | ORAL | Status: DC
  Administered 2014-03-06 – 2014-03-09 (×4): 10 mg via ORAL
  Filled 2014-03-06 (×4): qty 1

## 2014-03-06 MED ORDER — ONDANSETRON 4 MG PO TBDP: 4.0000 mg | ORAL_TABLET | Freq: Three times a day (TID) | ORAL | Status: DC | PRN

## 2014-03-06 MED ORDER — HEPARIN SOD (PORK) LOCK FLUSH 100 UNIT/ML IV SOLN: 250.0000 [IU] | Freq: Once | INTRAVENOUS | Status: AC | PRN

## 2014-03-06 MED ORDER — ENOXAPARIN SODIUM 40 MG/0.4ML ~~LOC~~ SOLN
40.0000 mg | SUBCUTANEOUS | Status: DC
  Administered 2014-03-06 – 2014-03-08 (×3): 40 mg via SUBCUTANEOUS
  Filled 2014-03-06 (×4): qty 0.4

## 2014-03-06 MED ORDER — SODIUM CHLORIDE 0.9 % IV SOLN
INTRAVENOUS | Status: DC
  Administered 2014-03-06 – 2014-03-08 (×2): via INTRAVENOUS

## 2014-03-06 MED ORDER — ALTEPLASE 2 MG IJ SOLR
2.0000 mg | Freq: Once | INTRAMUSCULAR | Status: AC | PRN
  Filled 2014-03-06: qty 2

## 2014-03-06 MED ORDER — SODIUM CHLORIDE 0.9 % IV SOLN
INTRAVENOUS | Status: DC
  Administered 2014-03-06: 16:00:00 via INTRAVENOUS

## 2014-03-06 MED ORDER — SENNOSIDES-DOCUSATE SODIUM 8.6-50 MG PO TABS: 1.0000 | ORAL_TABLET | Freq: Two times a day (BID) | ORAL | Status: DC | PRN

## 2014-03-06 MED ORDER — SIMVASTATIN 20 MG PO TABS
20.0000 mg | ORAL_TABLET | Freq: Every day | ORAL | Status: DC
  Administered 2014-03-06 – 2014-03-08 (×3): 20 mg via ORAL
  Filled 2014-03-06 (×4): qty 1

## 2014-03-06 MED ORDER — SODIUM CHLORIDE 0.9 % IJ SOLN: 10.0000 mL | INTRAMUSCULAR | Status: DC | PRN

## 2014-03-06 MED ORDER — HEPARIN SOD (PORK) LOCK FLUSH 100 UNIT/ML IV SOLN
500.0000 [IU] | Freq: Once | INTRAVENOUS | Status: AC | PRN
Start: 2014-03-06 — End: 2014-03-06

## 2014-03-06 MED ORDER — METFORMIN HCL 500 MG PO TABS
1000.0000 mg | ORAL_TABLET | Freq: Two times a day (BID) | ORAL | Status: DC
  Administered 2014-03-06 – 2014-03-09 (×6): 1000 mg via ORAL
  Filled 2014-03-06 (×8): qty 2

## 2014-03-06 MED ORDER — SODIUM CHLORIDE 0.9 % IV SOLN
600.0000 mg | Freq: Once | INTRAVENOUS | Status: DC
  Filled 2014-03-06 (×2): qty 60

## 2014-03-06 MED ORDER — MONTELUKAST SODIUM 10 MG PO TABS
10.0000 mg | ORAL_TABLET | Freq: Every day | ORAL | Status: DC
  Administered 2014-03-06 – 2014-03-08 (×3): 10 mg via ORAL
  Filled 2014-03-06 (×4): qty 1

## 2014-03-06 MED ORDER — AZELASTINE HCL 0.1 % NA SOLN
1.0000 | Freq: Two times a day (BID) | NASAL | Status: DC
  Administered 2014-03-06 – 2014-03-09 (×7): 1 via NASAL
  Filled 2014-03-06: qty 30

## 2014-03-06 MED ORDER — SODIUM CHLORIDE 0.9 % IJ SOLN: 3.0000 mL | INTRAMUSCULAR | Status: DC | PRN

## 2014-03-06 MED ORDER — SODIUM CHLORIDE 0.9 % IV SOLN
Freq: Once | INTRAVENOUS | Status: AC
  Administered 2014-03-07: 17:00:00 via INTRAVENOUS
  Filled 2014-03-06 (×2): qty 228

## 2014-03-06 MED ORDER — AZELASTINE-FLUTICASONE 137-50 MCG/ACT NA SUSP: 1.0000 | Freq: Two times a day (BID) | NASAL | Status: DC

## 2014-03-06 MED ORDER — LEVOTHYROXINE SODIUM 200 MCG PO TABS
200.0000 ug | ORAL_TABLET | Freq: Every day | ORAL | Status: DC
  Administered 2014-03-07 – 2014-03-09 (×3): 200 ug via ORAL
  Filled 2014-03-06 (×4): qty 1

## 2014-03-06 MED ORDER — LOVASTATIN ER 40 MG PO TB24: 40.0000 mg | ORAL_TABLET | Freq: Every day | ORAL | Status: DC

## 2014-03-06 MED ORDER — ONDANSETRON HCL 4 MG PO TABS: 4.0000 mg | ORAL_TABLET | Freq: Three times a day (TID) | ORAL | Status: DC | PRN

## 2014-03-06 MED ORDER — SERTRALINE HCL 100 MG PO TABS
100.0000 mg | ORAL_TABLET | Freq: Every day | ORAL | Status: DC
  Administered 2014-03-06 – 2014-03-09 (×4): 100 mg via ORAL
  Filled 2014-03-06 (×4): qty 1

## 2014-03-06 MED ORDER — ONDANSETRON 8 MG/NS 50 ML IVPB
8.0000 mg | Freq: Three times a day (TID) | INTRAVENOUS | Status: DC | PRN
  Filled 2014-03-06: qty 8

## 2014-03-06 MED ORDER — ONDANSETRON HCL 4 MG/2ML IJ SOLN: 4.0000 mg | Freq: Three times a day (TID) | INTRAMUSCULAR | Status: DC | PRN

## 2014-03-06 MED ORDER — POTASSIUM CHLORIDE CRYS ER 10 MEQ PO TBCR
10.0000 meq | EXTENDED_RELEASE_TABLET | Freq: Every day | ORAL | Status: DC
  Administered 2014-03-06 – 2014-03-09 (×4): 10 meq via ORAL
  Filled 2014-03-06 (×4): qty 1

## 2014-03-06 MED ORDER — FLUTICASONE PROPIONATE 50 MCG/ACT NA SUSP
1.0000 | Freq: Two times a day (BID) | NASAL | Status: DC
  Administered 2014-03-06 – 2014-03-09 (×7): 1 via NASAL
  Filled 2014-03-06: qty 16

## 2014-03-06 MED ORDER — ONDANSETRON HCL 4 MG/2ML IJ SOLN
INTRAMUSCULAR | Status: AC
  Administered 2014-03-06 – 2014-03-08 (×3): 16 mg via INTRAVENOUS
  Filled 2014-03-06 (×5): qty 8

## 2014-03-06 MED ORDER — ZOLPIDEM TARTRATE 5 MG PO TABS: 5.0000 mg | ORAL_TABLET | Freq: Every evening | ORAL | Status: DC | PRN

## 2014-03-06 MED ORDER — CARVEDILOL 6.25 MG PO TABS
6.2500 mg | ORAL_TABLET | Freq: Two times a day (BID) | ORAL | Status: DC
  Administered 2014-03-06 – 2014-03-09 (×6): 6.25 mg via ORAL
  Filled 2014-03-06 (×8): qty 1

## 2014-03-06 MED ORDER — ACETAMINOPHEN 325 MG PO TABS: 650.0000 mg | ORAL_TABLET | ORAL | Status: DC | PRN

## 2014-03-06 MED ORDER — OXYCODONE HCL 5 MG PO TABS: 5.0000 mg | ORAL_TABLET | Freq: Four times a day (QID) | ORAL | Status: DC | PRN

## 2014-03-06 MED ORDER — HOT PACK MISC ONCOLOGY
1.0000 | Freq: Once | Status: AC | PRN
  Filled 2014-03-06: qty 1

## 2014-03-06 MED ORDER — ALLOPURINOL 100 MG PO TABS
100.0000 mg | ORAL_TABLET | Freq: Two times a day (BID) | ORAL | Status: DC
  Administered 2014-03-06 – 2014-03-09 (×7): 100 mg via ORAL
  Filled 2014-03-06 (×8): qty 1

## 2014-03-06 NOTE — Progress Notes (Signed)
Verified BSA and calculations of Etoposide based on normal dosing with Drue Dun, RN

## 2014-03-06 NOTE — Telephone Encounter (Signed)
Pt notified inpt bed ready and to come on in.

## 2014-03-06 NOTE — H&P (Signed)
[place]  [phone]    ADMISSION NOTE  Admitting MD: [name], MD  Attending MD: [name], MD  Reason for Admission: Hodgkin's lymphoma   HPI: [name] [name] is an 49 y.o. male with a history of Hodgkin's lymphoma initially diagnosed stage III is listed below. In review,  CT scan of the neck, chest, abdomen and pelvis followed by a PET scan on 01/26/2014 showed progression of lymphoma within the neck, chest, abdomen and pelvis. The patient was referred to [facility] and was seen by Dr. Evette Doffing for evaluation of autologous peripheral blood stem cell transplant. Dr. Evette Doffing agreed with our recommendation regarding proceeding with salvage therapy with ICE for at least 2 cycles before considering him for transplant. He is being admitted today in stable condition to proceed.   Denies fevers, chills, night sweats, vision changes, or mucositis. Denies any respiratory complaints. Denies any chest pain or palpitations. Denies lower extremity swelling. Denies nausea, heartburn or change in bowel habits. Appetite is normal. Denies any dysuria. Denies abnormal skin rashes, or neuropathy. Denies any bleeding issues such as epistaxis, hematemesis, hematuria or hematochezia. Ambulating without difficulty.He has chronic insomnia.  DIAGNOSIS: Recurrent Hodgkin's lymphoma initially diagnosed as Stage III classical Hodgkin's lymphoma, mixed cellularity subtype diagnosed in December 2014.  PRIOR THERAPY: Status post 6 cycles of systemic chemotherapy with ABVD complicated with pulmonary fibrosis, last dose was given 07/14/2013  CURRENT THERAPY: Salvage chemotherapy with ICE (ifosfamide/mesna, carboplatin and etoposide) first dose expected 03/06/2014.  PMH:  Past Medical History  Diagnosis Date  . Hypertension   . Diabetes mellitus without complication     Pre-diabetes  . Depression   . Headache(784.0)   . Thyroid disease     Hx: of  .  Cancer 2015    cervical lymphadenopathy  . Hodgkin lymphoma 2015  . Hepatic artery aneurysm 2015    Surgeries:  Past Surgical History  Procedure Laterality Date  . Tonsillectomy    . Mass excision Left 12/31/2012    Procedure: CERVICAL LYMPH NODE BIOPSY;  Surgeon: [name], MD;  Location: [place];  Service: General;  Laterality: Left;    Allergies: No Known Allergies  Medications:   Prior to Admission:  Prescriptions prior to admission  Medication Sig Dispense Refill Last Dose  . Azelastine-Fluticasone 137-50 MCG/ACT SUSP Place 1 spray into the nose 2 (two) times daily.   03/05/2014  . carvedilol (COREG) 6.25 MG tablet Take 6.25 mg by mouth 2 (two) times daily with a meal.   03/05/2014 at 1230am  . eszopiclone (LUNESTA) 2 MG TABS tablet Take 2 mg by mouth at bedtime as needed for sleep. Take immediately before bedtime   03/05/2014  . fenofibrate (TRICOR) 145 MG tablet Take 1 tablet (145 mg total) by mouth daily. 30 tablet 6 03/04/2014  . fexofenadine-pseudoephedrine (ALLEGRA-D 24) 180-240 MG per 24 hr tablet Take 1 tablet by mouth daily.   03/04/2014  . levothyroxine (SYNTHROID, LEVOTHROID) 200 MCG tablet Take 200 mcg by mouth daily before breakfast.   03/06/2014 at 6sm  . lidocaine-prilocaine (EMLA) cream Apply 1 application topically as needed. Apply to port site one hour before treatment and cover with plastic wrap. 30 g 1 2 months ago  . lisinopril (PRINIVIL,ZESTRIL) 10 MG tablet Take 10 mg by mouth daily with breakfast.    03/04/2014  . lovastatin (ALTOPREV) 40 MG 24 hr tablet Take 40 mg by mouth at bedtime.   03/05/2014  . metFORMIN (GLUCOPHAGE) 500 MG tablet  Take 1,000 mg by mouth 2 (two) times daily.    03/04/2014  . montelukast (SINGULAIR) 10 MG tablet Take 10 mg by mouth at bedtime.   03/05/2014  . OVER THE COUNTER MEDICATION Take 2.5 mLs by mouth daily. Isotonix Antioxidant Powder   03/06/2014  . oxyCODONE (OXY IR/ROXICODONE) 5 MG immediate release tablet Take 1 tablet (5 mg  total) by mouth every 6 (six) hours as needed for severe pain. 60 tablet 0 6 months ago  . potassium chloride (K-DUR,KLOR-CON) 10 MEQ tablet Take 10 mEq by mouth daily.    03/04/2014  . sertraline (ZOLOFT) 100 MG tablet Take 100 mg by mouth daily with breakfast.    03/04/2014  . carvedilol (COREG) 6.25 MG tablet TAKE ONE TABLET BY MOUTH TWICE DAILY 60 tablet 0     PRN:  Review of Systems:  Constitutional: Denies fevers, chills or abnormal night sweats Eyes: Denies blurriness of vision, double vision or watery eyes Ears, nose, mouth, throat, and face: Denies mucositis or sore throat Respiratory: Denies cough, dyspnea or wheezes Cardiovascular: Denies palpitation, chest discomfort or lower extremity swelling Gastrointestinal:  Denies nausea, heartburn or change in bowel habits Skin: Denies abnormal skin rashes Lymphatics: Denies new lymphadenopathy or easy bruising Neurological:Denies numbness, tingling or new weaknesses Behavioral/Psych: Mood is stable, no new changes  All other systems were reviewed with the patient and are negative  Family History:  Family History  Problem Relation Age of Onset  . Aneurysm Mother 71    brain  . Obesity Mother   . Heart disease Mother   . AAA (abdominal aortic aneurysm) Mother     dx in her 27s  . Hodgkin's lymphoma Father     dx in his 22s, and again in his 59s  . Diabetes Father   . Cancer Brother     NOS dx in his 91s  . Bone cancer Maternal Uncle 40    don't know if primary or metastises    Social History:  reports that he quit smoking about 20 months ago. His smoking use included Cigarettes. He has a 31 pack-year smoking history. He has never used smokeless tobacco. He reports that he does not drink alcohol or use illicit drugs.  Physical Exam:   Filed Vitals:   03/06/14 1027  BP: 126/91  Pulse: 87  Temp: 97.8 F (36.6 C)  Resp: 20   Filed Weights   03/06/14 1027  Weight: 219 lb 12.8 oz (99.7 kg)    GENERAL:alert, no  distress and comfortable SKIN: skin color, texture, turgor are normal, no rashes or significant lesions EYES: normal, conjunctiva are pink and non-injected, sclera clear OROPHARYNX:no exudate, no erythema and lips, buccal mucosa, and tongue normal  NECK: supple, thyroid normal size, non-tender, Small cervical lymph node palpable in the left LYMPH:  no palpable lymphadenopathy in the cervical, axillary or inguinal LUNGS: clear to auscultation and percussion with normal breathing effort HEART: regular rate & rhythm and no murmurs and no lower extremity edema ABDOMEN:abdomen soft, non-tender and normal bowel sounds Musculoskeletal:no cyanosis of digits and no clubbing  PSYCH: alert & oriented x 3 with fluent speech NEURO: no focal motor/sensory deficits    LABS: CBC  No results for input(s): WBC, HGB, HCT, PLT, MCV, MCH, MCHC, RDW, LYMPHSABS, MONOABS, EOSABS, BASOSABS, BANDABS in the last 168 hours.  Invalid input(s): NEUTRABS, BANDSABD   Anemia panel:  No results for input(s): VITAMINB12, FOLATE, FERRITIN, TIBC, IRON, RETICCTPCT in the last 72 hours.  No results for input(s):  TSH, T4TOTAL, T3FREE, THYROIDAB in the last 72 hours.  Invalid input(s): FREET3      Component Value Date/Time   ESRSEDRATE 45* 05/06/2013 0936      CMP   No results for input(s): NA, K, CL, CO2, GLUCOSE, BUN, CREATININE, CALCIUM, MG, AST, ALT, ALKPHOS, BILITOT in the last 168 hours.  Invalid input(s): GFRCGP      Component Value Date/Time   BILITOT 0.43 02/27/2014 0912   BILITOT 0.3 02/05/2013 0528    No results for input(s): LIPASE, AMYLASE in the last 168 hours. No results for input(s): AMMONIA in the last 168 hours.   No results for input(s): INR, PROTIME in the last 168 hours.  No results for input(s): DDIMER in the last 72 hours.   Urinalysis No results found for: COLORURINE, APPEARANCEUR, LABSPEC, PHURINE, GLUCOSEU, HGBUR, BILIRUBINUR, KETONESUR, PROTEINUR, UROBILINOGEN,  NITRITE, LEUKOCYTESUR  Imaging Studies: No results found.   Assessment and Plan: 49 y.o. male with  Recurrent Hodgkin's Lymphoma  He is being admitted for salvage therapy with ICE for at least 2 cycles before considering him for transplant.  He is stable to proceed When monitored for tumor lysis. His LDH as of 02/27/2014 was 277, with a uric acid of 7.7 on 01/14/13 Continue allopurinol and IV fluids  DVT prophylaxis Pharmacy to monitor her Lovenox for VTE prophylaxis   Full Code    [facility] 03/06/2014 11:09 AM   ADDENDUM: Hematology/Oncology Attending: The patient is seen and examined today. I agree with the above note. This is a very pleasant 49 years old white male who recently was found to have recurrent Hodgkin's lymphoma, Mixed cellularity subtype.  He was evaluated at [facility] for consideration of autologous peripheral blood stem cell transplant. The patient is here today to start the first cycle of salvage chemotherapy with ICE (ifosfamide, mesna, carboplatin and etoposide). The patient is feeling fine today with no specific complaints. We will proceed with the first cycle today as a scheduled. We will start the patient on allopurinol for tumor lysis prophylaxis. We will continue to monitor his blood work on daily basis.

## 2014-03-07 DIAGNOSIS — C8590 Non-Hodgkin lymphoma, unspecified, unspecified site: Secondary | ICD-10-CM | POA: Diagnosis present

## 2014-03-07 LAB — COMPREHENSIVE METABOLIC PANEL
ALBUMIN: 4.2 g/dL (ref 3.5–5.2)
ALT: 61 U/L — ABNORMAL HIGH (ref 0–53)
AST: 44 U/L — ABNORMAL HIGH (ref 0–37)
Alkaline Phosphatase: 52 U/L (ref 39–117)
Anion gap: 10 (ref 5–15)
BUN: 19 mg/dL (ref 6–23)
CO2: 25 mmol/L (ref 19–32)
CREATININE: 1 mg/dL (ref 0.50–1.35)
Calcium: 9.3 mg/dL (ref 8.4–10.5)
Chloride: 100 mmol/L (ref 96–112)
GFR calc Af Amer: >90 mL/min (ref >90)
GFR calc non Af Amer: 87 mL/min — ABNORMAL LOW (ref >90)
GLUCOSE: 200 mg/dL — AB (ref 70–99)
Potassium: 4.1 mmol/L (ref 3.5–5.1)
Sodium: 135 mmol/L (ref 135–145)
Total Bilirubin: 0.6 mg/dL (ref 0.3–1.2)
Total Protein: 8.4 g/dL — ABNORMAL HIGH (ref 6.0–8.3)

## 2014-03-07 LAB — CBC WITH DIFFERENTIAL/PLATELET
BASOS PCT: 0 % (ref 0–1)
Basophils Absolute: 0 10*3/uL (ref 0.0–0.1)
Eosinophils Absolute: 0 10*3/uL (ref 0.0–0.7)
Eosinophils Relative: 0 % (ref 0–5)
HCT: 38 % — ABNORMAL LOW (ref 39.0–52.0)
Hemoglobin: 13.2 g/dL (ref 13.0–17.0)
LYMPHS PCT: 17 % (ref 12–46)
Lymphs Abs: 1.4 10*3/uL (ref 0.7–4.0)
MCH: 32 pg (ref 26.0–34.0)
MCHC: 34.7 g/dL (ref 30.0–36.0)
MCV: 92.2 fL (ref 78.0–100.0)
MONOS PCT: 2 % — AB (ref 3–12)
Monocytes Absolute: 0.2 10*3/uL (ref 0.1–1.0)
NEUTROS ABS: 6.9 10*3/uL (ref 1.7–7.7)
Neutrophils Relative %: 81 % — ABNORMAL HIGH (ref 43–77)
Platelets: 307 10*3/uL (ref 150–400)
RBC: 4.12 MIL/uL — ABNORMAL LOW (ref 4.22–5.81)
RDW: 13.3 % (ref 11.5–15.5)
WBC: 8.4 10*3/uL (ref 4.0–10.5)

## 2014-03-07 MED ORDER — SODIUM CHLORIDE 0.9 % IV SOLN
750.0000 mg | Freq: Once | INTRAVENOUS | Status: AC
  Administered 2014-03-07: 750 mg via INTRAVENOUS
  Filled 2014-03-07: qty 75

## 2014-03-07 NOTE — Progress Notes (Signed)
Verified patient's BSA, chemo dosages and dilutions with 2nd RN [name] [name].

## 2014-03-07 NOTE — Progress Notes (Signed)
[name] [name]   DOB:Jun 10, 1965   ZO#:[id number]   JWJ#:[id number]  Patient Care Team: Pcp Not In System as PCP - General Jacelyn Pi, MD as Consulting Physician (Endocrinology)  Subjective: Patient seen and examined. Tolerated Day 1 C1 chemo without complications.  No events overnight. Denies fevers, chills, night sweats, vision changes, or mucositis. Denies any respiratory complaints. Denies any chest pain or palpitations. Denies lower extremity swelling. Denies nausea, heartburn or change in bowel habits. Last bowel movement on 2/22   Appetite is normal. Denies any dysuria. Denies abnormal skin rashes, or neuropathy. Denies any bleeding issues such as epistaxis, hematemesis, hematuria or hematochezia. Ambulating without difficulty.   Scheduled Meds: . allopurinol  100 mg Oral BID  . fluticasone  1 spray Each Nare BID   And  . azelastine  1 spray Each Nare BID  . CARBOplatin  600 mg Intravenous Once  . carvedilol  6.25 mg Oral BID WC  . enoxaparin (LOVENOX) injection  40 mg Subcutaneous Q24H  . etoposide  100 mg/m2 (Treatment Plan Actual) Intravenous Q24H  . ifosfamide/mesna (IFEX/MESNEX) CHEMO for Inpatient CI in 1000 ml   Intravenous Once  . levothyroxine  200 mcg Oral QAC breakfast  . lisinopril  10 mg Oral Daily  . metFORMIN  1,000 mg Oral BID WC  . montelukast  10 mg Oral QHS  . ondansetron (ZOFRAN) with dexamethasone (DECADRON) IV   Intravenous Q24H  . potassium chloride  10 mEq Oral Daily  . sertraline  100 mg Oral Daily  . simvastatin  20 mg Oral QHS   Continuous Infusions: . sodium chloride 50 mL/hr at 03/06/14 1910  . sodium chloride Stopped (03/06/14 1900)   PRN Meds:acetaminophen, alum & mag hydroxide-simeth, lidocaine-prilocaine, ondansetron **OR** ondansetron **OR** ondansetron (ZOFRAN) IV **OR** ondansetron (ZOFRAN) IV, oxyCODONE, senna-docusate, sodium chloride, sodium chloride, zolpidem   Objective:  Filed Vitals:   03/07/14 0605  BP: 115/75  Pulse: 73   Temp: 97.4 F (36.3 C)  Resp: 20      Intake/Output Summary (Last 24 hours) at 03/07/14 0732 Last data filed at 03/07/14 0606  Gross per 24 hour  Intake   1487 ml  Output   1800 ml  Net   -313 ml    ECOG PERFORMANCE STATUS: 0  GENERAL:alert, no distress and comfortable SKIN: skin color, texture, turgor are normal, no rashes or significant lesions EYES: normal, conjunctiva are pink and non-injected, sclera clear OROPHARYNX:no exudate, no erythema and lips, buccal mucosa, and tongue normal  NECK: supple, thyroid normal size, non-tender, without nodularity LYMPH:  no palpable lymphadenopathy in the cervical, axillary or inguinal LUNGS: clear to auscultation and percussion with normal breathing effort HEART: regular rate & rhythm and no murmurs and no lower extremity edema ABDOMEN: soft, non-tender and normal bowel sounds Musculoskeletal:no cyanosis of digits and no clubbing  PSYCH: alert & oriented x 3 with fluent speech NEURO: no focal motor/sensory deficits    CBG (last 3)  No results for input(s): GLUCAP in the last 72 hours.   Labs:   Recent Labs Lab 03/07/14 0608  WBC 8.4  HGB 13.2  HCT 38.0*  PLT 307  MCV 92.2  MCH 32.0  MCHC 34.7  RDW 13.3  LYMPHSABS 1.4  MONOABS 0.2  EOSABS 0.0  BASOSABS 0.0     Chemistries:    Recent Labs Lab 03/06/14 1405 03/07/14 0608  NA 135 135  K 3.9 4.1  CL 99 100  CO2 28 25  GLUCOSE 177* 200*  BUN 20  19  CREATININE 1.13 1.00  CALCIUM 9.4 9.3  AST 37 44*  ALT 41 61*  ALKPHOS 56 52  BILITOT 0.9 0.6    GFR Estimated Creatinine Clearance: 107 mL/min (by C-G formula based on Cr of 1).  Liver Function Tests:  Recent Labs Lab 03/06/14 1405 03/07/14 0608  AST 37 44*  ALT 41 61*  ALKPHOS 56 52  BILITOT 0.9 0.6  PROT 8.0 8.4*  ALBUMIN 4.1 4.2     Imaging Studies:  No results found.  Assessment/Plan: 49 y.o.   Recurrent Hodgkin's Lymphoma He is being admitted for salvage therapy with ICE for at  least 2 cycles before considering him for transplant.  Will monitored for tumor lysis. His LDH as of 02/27/2014 was 277, with a uric acid of 7.7 on 01/14/13 Continue allopurinol and IV fluids  DVT prophylaxis On Lovenox for VTE prophylaxis  Full Code      **Disclaimer: This note was dictated with voice recognition software. Similar sounding words can inadvertently be transcribed and this note may contain transcription errors which may not have been corrected upon publication of note.** [name], PA-C 03/07/2014  7:32 AM  ADDENDUM: Hematology/Oncology Attending: The patient is seen and examined. I agree with the above note. This is a very pleasant 49 years old white male with recurrent Hodgkin's lymphoma currently undergoing salvage chemotherapy with ICE, day 2 cycle #1. He tolerated the first day of his treatment fairly well with no significant complaints. The patient denied having any significant nausea or vomiting. We will continue with his chemotherapy today as a scheduled. The patient will continue on allopurinol and IV fluid for the tumor lysis prophylaxis.

## 2014-03-07 NOTE — Progress Notes (Signed)
UR complete 

## 2014-03-08 LAB — CBC WITH DIFFERENTIAL/PLATELET
BASOS ABS: 0 10*3/uL (ref 0.0–0.1)
BASOS PCT: 0 % (ref 0–1)
Eosinophils Absolute: 0 10*3/uL (ref 0.0–0.7)
Eosinophils Relative: 0 % (ref 0–5)
HCT: 36.8 % — ABNORMAL LOW (ref 39.0–52.0)
Hemoglobin: 12.7 g/dL — ABNORMAL LOW (ref 13.0–17.0)
Lymphocytes Relative: 16 % (ref 12–46)
Lymphs Abs: 1.6 10*3/uL (ref 0.7–4.0)
MCH: 31.8 pg (ref 26.0–34.0)
MCHC: 34.5 g/dL (ref 30.0–36.0)
MCV: 92.2 fL (ref 78.0–100.0)
MONO ABS: 0.9 10*3/uL (ref 0.1–1.0)
Monocytes Relative: 8 % (ref 3–12)
NEUTROS ABS: 7.9 10*3/uL — AB (ref 1.7–7.7)
NEUTROS PCT: 76 % (ref 43–77)
Platelets: 265 10*3/uL (ref 150–400)
RBC: 3.99 MIL/uL — ABNORMAL LOW (ref 4.22–5.81)
RDW: 13.2 % (ref 11.5–15.5)
WBC: 10.4 10*3/uL (ref 4.0–10.5)

## 2014-03-08 LAB — COMPREHENSIVE METABOLIC PANEL
ALK PHOS: 53 U/L (ref 39–117)
ALT: 42 U/L (ref 0–53)
AST: 28 U/L (ref 0–37)
Albumin: 4.1 g/dL (ref 3.5–5.2)
Anion gap: 9 (ref 5–15)
BILIRUBIN TOTAL: 0.8 mg/dL (ref 0.3–1.2)
BUN: 19 mg/dL (ref 6–23)
CHLORIDE: 100 mmol/L (ref 96–112)
CO2: 27 mmol/L (ref 19–32)
Calcium: 9.3 mg/dL (ref 8.4–10.5)
Creatinine, Ser: 0.95 mg/dL (ref 0.50–1.35)
GFR calc Af Amer: >90 mL/min (ref >90)
GFR calc non Af Amer: >90 mL/min (ref >90)
Glucose, Bld: 170 mg/dL — ABNORMAL HIGH (ref 70–99)
POTASSIUM: 4 mmol/L (ref 3.5–5.1)
Sodium: 136 mmol/L (ref 135–145)
Total Protein: 8 g/dL (ref 6.0–8.3)

## 2014-03-08 NOTE — Progress Notes (Signed)
Chemo dosages and calculations checked with Aurelio Jew.

## 2014-03-08 NOTE — Progress Notes (Signed)
[name] [name]   DOB:18-Apr-1965   KZ#:[id number]   TDD#:[id number]  Patient Care Team: Pcp Not In System as PCP - General Jacelyn Pi, MD as Consulting Physician (Endocrinology)  Subjective: Patient seen and examined. Tolerated Day 2 C1 chemo without complications. No events overnight. Denies fevers, chills, night sweats, vision changes, or mucositis. Denies any respiratory complaints. Denies any chest pain or palpitations. Denies lower extremity swelling. Denies nausea, heartburn or change in bowel habits. Last bowel movement on 2/23  Appetite is normal. Denies any dysuria. Denies abnormal skin rashes, or neuropathy. Denies any bleeding issues such as epistaxis, hematemesis, hematuria or hematochezia. Ambulating without difficulty.   Scheduled Meds: . allopurinol  100 mg Oral BID  . fluticasone  1 spray Each Nare BID   And  . azelastine  1 spray Each Nare BID  . carvedilol  6.25 mg Oral BID WC  . enoxaparin (LOVENOX) injection  40 mg Subcutaneous Q24H  . etoposide  100 mg/m2 (Treatment Plan Actual) Intravenous Q24H  . ifosfamide/mesna (IFEX/MESNEX) CHEMO for Inpatient CI in 1000 ml   Intravenous Once  . levothyroxine  200 mcg Oral QAC breakfast  . lisinopril  10 mg Oral Daily  . metFORMIN  1,000 mg Oral BID WC  . montelukast  10 mg Oral QHS  . ondansetron (ZOFRAN) with dexamethasone (DECADRON) IV   Intravenous Q24H  . potassium chloride  10 mEq Oral Daily  . sertraline  100 mg Oral Daily  . simvastatin  20 mg Oral QHS   Continuous Infusions: . sodium chloride 50 mL/hr at 03/06/14 1910  . sodium chloride Stopped (03/06/14 1900)   PRN Meds:acetaminophen, alum & mag hydroxide-simeth, lidocaine-prilocaine, ondansetron **OR** ondansetron **OR** ondansetron (ZOFRAN) IV **OR** ondansetron (ZOFRAN) IV, oxyCODONE, senna-docusate, sodium chloride, sodium chloride, zolpidem   Objective:  Filed Vitals:   03/08/14 0555  BP: 128/79  Pulse: 80  Temp: 97.4 F (36.3 C)  Resp: 20       Intake/Output Summary (Last 24 hours) at 03/08/14 0757 Last data filed at 03/08/14 0557  Gross per 24 hour  Intake   4280 ml  Output   3725 ml  Net    555 ml    ECOG PERFORMANCE STATUS: 0  GENERAL:alert, no distress and comfortable SKIN: skin color, texture, turgor are normal, no rashes or significant lesions EYES: normal, conjunctiva are pink and non-injected, sclera clear OROPHARYNX:no exudate, no erythema and lips, buccal mucosa, and tongue normal  NECK: supple, thyroid normal size, non-tender, without nodularity LYMPH:  no palpable lymphadenopathy in the cervical, axillary or inguinal LUNGS: clear to auscultation and percussion with normal breathing effort HEART: regular rate & rhythm and no murmurs and no lower extremity edema ABDOMEN: soft, non-tender and normal bowel sounds Musculoskeletal:no cyanosis of digits and no clubbing  PSYCH: alert & oriented x 3 with fluent speech NEURO: no focal motor/sensory deficits    CBG (last 3)  No results for input(s): GLUCAP in the last 72 hours.   Labs:   Recent Labs Lab 03/07/14 0608 03/08/14 0420  WBC 8.4 10.4  HGB 13.2 12.7*  HCT 38.0* 36.8*  PLT 307 265  MCV 92.2 92.2  MCH 32.0 31.8  MCHC 34.7 34.5  RDW 13.3 13.2  LYMPHSABS 1.4 1.6  MONOABS 0.2 0.9  EOSABS 0.0 0.0  BASOSABS 0.0 0.0     Chemistries:    Recent Labs Lab 03/06/14 1405 03/07/14 0608 03/08/14 0420  NA 135 135 136  K 3.9 4.1 4.0  CL 99 100 100  CO2 28 25 27   GLUCOSE 177* 200* 170*  BUN 20 19 19   CREATININE 1.13 1.00 0.95  CALCIUM 9.4 9.3 9.3  AST 37 44* 28  ALT 41 61* 42  ALKPHOS 56 52 53  BILITOT 0.9 0.6 0.8    GFR Estimated Creatinine Clearance: 114.2 mL/min (by C-G formula based on Cr of 0.95).  Liver Function Tests:  Recent Labs Lab 03/06/14 1405 03/07/14 0608 03/08/14 0420  AST 37 44* 28  ALT 41 61* 42  ALKPHOS 56 52 53  BILITOT 0.9 0.6 0.8  PROT 8.0 8.4* 8.0  ALBUMIN 4.1 4.2 4.1     Imaging Studies:  No results  found.  Assessment/Plan: 49 y.o.   Recurrent Hodgkin's Lymphoma [name] [name] is being admitted for salvage therapy with ICE for at least 2 cycles before considering him for transplant.  Will monitored for tumor lysis. His LDH as of 02/27/2014 was 277, with a uric acid of 7.7 on 01/14/13 Continue allopurinol and IV fluids  DVT prophylaxis On Lovenox for VTE prophylaxis  Full Code      **Disclaimer: This note was dictated with voice recognition software. Similar sounding words can inadvertently be transcribed and this note may contain transcription errors which may not have been corrected upon publication of note.** [name], PA-C 03/08/2014  7:57 AM  ADDENDUM: Hematology/Oncology Attending: The patient is seen and examined. I agree with the above note. [name] [name] is currently day #3 of the first cycle of salvage chemotherapy with ICE for recurrent Hodgkin's lymphoma. [name] [name] is tolerating his treatment well so far with no significant adverse effects. We will continue his allopurinol and IV hydration for tumor lysis prophylaxis. The patient is expected to be discharged home tomorrow after completion of the chemotherapy with Neulasta injection on 03/10/2014.

## 2014-03-09 ENCOUNTER — Other Ambulatory Visit: Payer: Self-pay | Admitting: Cardiology

## 2014-03-09 LAB — COMPREHENSIVE METABOLIC PANEL
ALT: 38 U/L (ref 0–53)
AST: 27 U/L (ref 0–37)
Albumin: 3.8 g/dL (ref 3.5–5.2)
Alkaline Phosphatase: 47 U/L (ref 39–117)
Anion gap: 8 (ref 5–15)
BILIRUBIN TOTAL: 0.8 mg/dL (ref 0.3–1.2)
BUN: 21 mg/dL (ref 6–23)
CHLORIDE: 99 mmol/L (ref 96–112)
CO2: 26 mmol/L (ref 19–32)
Calcium: 9.2 mg/dL (ref 8.4–10.5)
Creatinine, Ser: 0.92 mg/dL (ref 0.50–1.35)
GFR calc Af Amer: >90 mL/min (ref >90)
Glucose, Bld: 134 mg/dL — ABNORMAL HIGH (ref 70–99)
Potassium: 4 mmol/L (ref 3.5–5.1)
SODIUM: 133 mmol/L — AB (ref 135–145)
Total Protein: 7 g/dL (ref 6.0–8.3)

## 2014-03-09 LAB — CBC WITH DIFFERENTIAL/PLATELET
BASOS ABS: 0 10*3/uL (ref 0.0–0.1)
BASOS PCT: 0 % (ref 0–1)
EOS PCT: 0 % (ref 0–5)
Eosinophils Absolute: 0 10*3/uL (ref 0.0–0.7)
HEMATOCRIT: 33.4 % — AB (ref 39.0–52.0)
HEMOGLOBIN: 11.4 g/dL — AB (ref 13.0–17.0)
Lymphocytes Relative: 17 % (ref 12–46)
Lymphs Abs: 1.1 10*3/uL (ref 0.7–4.0)
MCH: 32.1 pg (ref 26.0–34.0)
MCHC: 34.1 g/dL (ref 30.0–36.0)
MCV: 94.1 fL (ref 78.0–100.0)
Monocytes Absolute: 0.3 10*3/uL (ref 0.1–1.0)
Monocytes Relative: 5 % (ref 3–12)
NEUTROS ABS: 5.2 10*3/uL (ref 1.7–7.7)
Neutrophils Relative %: 78 % — ABNORMAL HIGH (ref 43–77)
Platelets: 247 10*3/uL (ref 150–400)
RBC: 3.55 MIL/uL — ABNORMAL LOW (ref 4.22–5.81)
RDW: 13.6 % (ref 11.5–15.5)
WBC: 6.7 10*3/uL (ref 4.0–10.5)

## 2014-03-09 MED ORDER — PROCHLORPERAZINE MALEATE 10 MG PO TABS: 10.0000 mg | ORAL_TABLET | Freq: Four times a day (QID) | ORAL | Status: DC | PRN

## 2014-03-09 MED ORDER — ALLOPURINOL 100 MG PO TABS: 100.0000 mg | ORAL_TABLET | Freq: Two times a day (BID) | ORAL | Status: DC

## 2014-03-09 MED ORDER — HEPARIN SOD (PORK) LOCK FLUSH 100 UNIT/ML IV SOLN
500.0000 [IU] | Freq: Once | INTRAVENOUS | Status: DC
  Filled 2014-03-09: qty 5

## 2014-03-09 NOTE — Discharge Summary (Signed)
Patient ID: [name] [name] MRN: [id number] DOB/AGE: 1965/09/10 49 y.o.  Admit date: 03/06/2014 Discharge date: 03/09/2014  Patient Care Team: Pcp Not In System as PCP - General Jacelyn Pi, MD as Consulting Physician (Endocrinology)  Brief History of Present Illness: For complete details please refer to admission H and P, but in brief, this is a very pleasant 49 years old white male who recently was found to have recurrent Hodgkin's lymphoma, Mixed cellularity subtype. He was evaluated at [facility] for consideration of autologous peripheral blood stem cell transplant. He was admitted on 2/22 for first cycle of salvage chemotherapy with ICE (ifosfamide, mesna, carboplatin and etoposide). He was also started on  allopurinol for tumor lysis prophylaxis.  Discharge Diagnoses/Hospital Course:  Recurrent Hodgkin's Lymphoma He is being admitted for salvage therapy with ICE for at least 2 cycles before considering him for transplant.  Will monitored for tumor lysis. His LDH as of 02/27/2014 was 277, with a uric acid of 7.7 on 01/14/13 He continued allopurinol and IV fluids during hospitalization A prescription for allopurinol 100 mg bid was written. He will return to the clinic for Neulasta injection on 2/26 and will return to the clinic on 2/29 for port hospital follow up.   Anemia This is due to recent chemotherapy and dilution, blood draws No bleeding issues are reported Will continue to monitor as outpatient  DVT prophylaxis He received Lovenox for VTE prophylaxis  Full Code   Procedures: S/P Chemotherapy with  salvage chemotherapy with ICE (ifosfamide, mesna, carboplatin and etoposide)   Past Medical History  Diagnosis Date  . Hypertension   . Diabetes mellitus without complication     Pre-diabetes  . Depression   . Headache(784.0)   . Thyroid disease     Hx: of  . Cancer 2015    cervical lymphadenopathy  . Hodgkin lymphoma 2015  . Hepatic artery  aneurysm 2015    Discharge Medications:    Medication List    STOP taking these medications        lidocaine-prilocaine cream  Commonly known as:  EMLA      TAKE these medications        allopurinol 100 MG tablet  Commonly known as:  ZYLOPRIM  Take 1 tablet (100 mg total) by mouth 2 (two) times daily.     Azelastine-Fluticasone 137-50 MCG/ACT Susp  Place 1 spray into the nose 2 (two) times daily.     carvedilol 6.25 MG tablet  Commonly known as:  COREG  TAKE ONE TABLET BY MOUTH TWICE DAILY     eszopiclone 2 MG Tabs tablet  Commonly known as:  LUNESTA  Take 2 mg by mouth at bedtime as needed for sleep. Take immediately before bedtime     fenofibrate 145 MG tablet  Commonly known as:  TRICOR  Take 1 tablet (145 mg total) by mouth daily.     fexofenadine-pseudoephedrine 180-240 MG per 24 hr tablet  Commonly known as:  ALLEGRA-D 24  Take 1 tablet by mouth daily.     levothyroxine 200 MCG tablet  Commonly known as:  SYNTHROID, LEVOTHROID  Take 200 mcg by mouth daily before breakfast.     lisinopril 10 MG tablet  Commonly known as:  PRINIVIL,ZESTRIL  Take 10 mg by mouth daily with breakfast.     lovastatin 40 MG 24 hr tablet  Commonly known as:  ALTOPREV  Take 40 mg by mouth at bedtime.     metFORMIN 500 MG tablet  Commonly known  as:  GLUCOPHAGE  Take 1,000 mg by mouth 2 (two) times daily.     montelukast 10 MG tablet  Commonly known as:  SINGULAIR  Take 10 mg by mouth at bedtime.     OVER THE COUNTER MEDICATION  Take 2.5 mLs by mouth daily. Isotonix Antioxidant Powder     oxyCODONE 5 MG immediate release tablet  Commonly known as:  Oxy IR/ROXICODONE  Take 1 tablet (5 mg total) by mouth every 6 (six) hours as needed for severe pain.     potassium chloride 10 MEQ tablet  Commonly known as:  K-DUR,KLOR-CON  Take 10 mEq by mouth daily.     prochlorperazine 10 MG tablet  Commonly known as:  COMPAZINE  Take 1 tablet (10 mg total) by mouth every 6 (six)  hours as needed for nausea or vomiting.     sertraline 100 MG tablet  Commonly known as:  ZOLOFT  Take 100 mg by mouth daily with breakfast.        Discharge Condition: Improved.  Diet recommendation Diabetic diet   ECOG PERFORMANCE STATUS: 0 Physical Exam at Discharge:  Subjective:  Tolerated Day 3 C1 chemo without complications. No events overnight. Denies fevers, chills, night sweats, vision changes, or mucositis. Denies any respiratory complaints. Denies any chest pain or palpitations. Denies lower extremity swelling. Denies nausea, heartburn or change in bowel habits. Last bowel movement on 2/24 Appetite is normal. Denies any dysuria. Denies abnormal skin rashes, or neuropathy. Denies any bleeding issues such as epistaxis, hematemesis, hematuria or hematochezia. Ambulating without difficulty.  Objective:  BP 109/67 mmHg  Pulse 61  Temp(Src) 97.4 F (36.3 C) (Oral)  Resp 20  Ht 5\' 10"  (1.778 m)  Wt 226 lb 10.1 oz (102.8 kg)  BMI 32.52 kg/m2  SpO2 100%  GENERAL:alert, no distress and comfortable SKIN: skin color, texture, turgor are normal, no rashes or significant lesions EYES: normal, conjunctiva are pink and non-injected, sclera clear OROPHARYNX:no exudate, no erythema and lips, buccal mucosa, and tongue normal  NECK: supple, thyroid normal size, non-tender, without nodularity LYMPH:  no palpable lymphadenopathy in the cervical, axillary or inguinal area LUNGS: clear to auscultation and percussion with normal breathing effort HEART: regular rate & rhythm and no murmurs and no lower extremity edema ABDOMEN:abdomen soft, non-tender and normal bowel sounds Musculoskeletal:no cyanosis of digits and no clubbing  PSYCH: alert & oriented x 3 with fluent speech NEURO: no focal motor/sensory deficits    Significant Diagnostic Studies:  No results found.  Discharge Laboratory Values:  CBC  Recent Labs Lab 03/07/14 0608 03/08/14 0420 03/09/14 0435  WBC 8.4 10.4 6.7   HGB 13.2 12.7* 11.4*  HCT 38.0* 36.8* 33.4*  PLT 307 265 247  MCV 92.2 92.2 94.1  MCH 32.0 31.8 32.1  MCHC 34.7 34.5 34.1  RDW 13.3 13.2 13.6  LYMPHSABS 1.4 1.6 1.1  MONOABS 0.2 0.9 0.3  EOSABS 0.0 0.0 0.0  BASOSABS 0.0 0.0 0.0   Chemistries   Recent Labs Lab 03/06/14 1405 03/07/14 0608 03/08/14 0420 03/09/14 0435  NA 135 135 136 133*  K 3.9 4.1 4.0 4.0  CL 99 100 100 99  CO2 28 25 27 26   GLUCOSE 177* 200* 170* 134*  BUN 20 19 19 21   CREATININE 1.13 1.00 0.95 0.92  CALCIUM 9.4 9.3 9.3 9.2     Signed: Sharene Butters, PA-C 03/09/2014, 8:53 AM  ADDENDUM: Hematology/Oncology Attending: The patient is seen and examined. I agree with the above note. He is currently day #  4 of the first cycle of salvage chemotherapy with ICE for recurrent Hodgkin's lymphoma. He completed his chemotherapy today. He is tolerating his treatment well so far with no significant adverse effects. We will continue his allopurinol on outpatient basis We will discharge the patient today with Neulasta injection on 03/10/2014 at the cancer center. He has a follow up appointment with me next week.

## 2014-03-09 NOTE — Care Management Note (Signed)
    Page 1 of 1   03/09/2014     11:27:41 AM CARE MANAGEMENT NOTE 03/09/2014  Patient:  [name], [name]   Account Number:  [id number]  Date Initiated:  03/09/2014  Documentation initiated by:  [facility]  Subjective/Objective Assessment:   adm: He was admitted on 2/22 for first cycle of salvage chemotherapy with ICE (ifosfamide, mesna, carboplatin and etoposide).     Action/Plan:   discharge planning   Anticipated DC Date:  03/09/2014   Anticipated DC Plan:  [place]  CM consult      Choice offered to / List presented to:             Status of service:   Medicare Important Message given?   (If response is "NO", the following Medicare IM given date fields will be blank) Date Medicare IM given:   Medicare IM given by:   Date Additional Medicare IM given:   Additional Medicare IM given by:    Discharge Disposition:  HOME/SELF CARE  Per UR Regulation:    If discussed at Long Length of Stay Meetings, dates discussed:    Comments:  03/09/14 No CM needs communicated.  Mariane Masters, BSN, Cm [phone].

## 2014-03-10 ENCOUNTER — Ambulatory Visit (HOSPITAL_BASED_OUTPATIENT_CLINIC_OR_DEPARTMENT_OTHER): Payer: Commercial Managed Care - PPO

## 2014-03-10 DIAGNOSIS — Z5189 Encounter for other specified aftercare: Secondary | ICD-10-CM

## 2014-03-10 DIAGNOSIS — C8128 Mixed cellularity classical Hodgkin lymphoma, lymph nodes of multiple sites: Secondary | ICD-10-CM

## 2014-03-10 DIAGNOSIS — C8120 Mixed cellularity classical Hodgkin lymphoma, unspecified site: Secondary | ICD-10-CM

## 2014-03-10 MED ORDER — PEGFILGRASTIM INJECTION 6 MG/0.6ML ~~LOC~~
6.0000 mg | PREFILLED_SYRINGE | Freq: Once | SUBCUTANEOUS | Status: AC
  Administered 2014-03-10: 6 mg via SUBCUTANEOUS
  Filled 2014-03-10: qty 0.6

## 2014-03-10 NOTE — Patient Instructions (Signed)
Pegfilgrastim injection What is this medicine? PEGFILGRASTIM (peg fil GRA stim) is a long-acting granulocyte colony-stimulating factor that stimulates the growth of neutrophils, a type of white blood cell important in the body's fight against infection. It is used to reduce the incidence of fever and infection in patients with certain types of cancer who are receiving chemotherapy that affects the bone marrow. This medicine may be used for other purposes; ask your health care provider or pharmacist if you have questions. COMMON BRAND NAME(S): Neulasta What should I tell my health care provider before I take this medicine? They need to know if you have any of these conditions: -latex allergy -ongoing radiation therapy -sickle cell disease -skin reactions to acrylic adhesives (On-Body Injector only) -an unusual or allergic reaction to pegfilgrastim, filgrastim, other medicines, foods, dyes, or preservatives -pregnant or trying to get pregnant -breast-feeding How should I use this medicine? This medicine is for injection under the skin. If you get this medicine at home, you will be taught how to prepare and give the pre-filled syringe or how to use the On-body Injector. Refer to the patient Instructions for Use for detailed instructions. Use exactly as directed. Take your medicine at regular intervals. Do not take your medicine more often than directed. It is important that you put your used needles and syringes in a special sharps container. Do not put them in a trash can. If you do not have a sharps container, call your pharmacist or healthcare provider to get one. Talk to your pediatrician regarding the use of this medicine in children. Special care may be needed. Overdosage: If you think you have taken too much of this medicine contact a poison control center or emergency room at once. NOTE: This medicine is only for you. Do not share this medicine with others. What if I miss a dose? It is  important not to miss your dose. Call your doctor or health care professional if you miss your dose. If you miss a dose due to an On-body Injector failure or leakage, a new dose should be administered as soon as possible using a single prefilled syringe for manual use. What may interact with this medicine? Interactions have not been studied. Give your health care provider a list of all the medicines, herbs, non-prescription drugs, or dietary supplements you use. Also tell them if you smoke, drink alcohol, or use illegal drugs. Some items may interact with your medicine. This list may not describe all possible interactions. Give your health care provider a list of all the medicines, herbs, non-prescription drugs, or dietary supplements you use. Also tell them if you smoke, drink alcohol, or use illegal drugs. Some items may interact with your medicine. What should I watch for while using this medicine? You may need blood work done while you are taking this medicine. If you are going to need a MRI, CT scan, or other procedure, tell your doctor that you are using this medicine (On-Body Injector only). What side effects may I notice from receiving this medicine? Side effects that you should report to your doctor or health care professional as soon as possible: -allergic reactions like skin rash, itching or hives, swelling of the face, lips, or tongue -dizziness -fever -pain, redness, or irritation at site where injected -pinpoint red spots on the skin -shortness of breath or breathing problems -stomach or side pain, or pain at the shoulder -swelling -tiredness -trouble passing urine Side effects that usually do not require medical attention (report to your doctor   or health care professional if they continue or are bothersome): -bone pain -muscle pain This list may not describe all possible side effects. Call your doctor for medical advice about side effects. You may report side effects to FDA at  1-800-FDA-1088. Where should I keep my medicine? Keep out of the reach of children. Store pre-filled syringes in a refrigerator between 2 and 8 degrees C (36 and 46 degrees F). Do not freeze. Keep in carton to protect from light. Throw away this medicine if it is left out of the refrigerator for more than 48 hours. Throw away any unused medicine after the expiration date. NOTE: This sheet is a summary. It may not cover all possible information. If you have questions about this medicine, talk to your doctor, pharmacist, or health care provider.  2015, [employer]. (2013-03-31 16:14:05)  

## 2014-03-13 ENCOUNTER — Encounter: Payer: Self-pay | Admitting: Oncology

## 2014-03-13 ENCOUNTER — Other Ambulatory Visit (HOSPITAL_BASED_OUTPATIENT_CLINIC_OR_DEPARTMENT_OTHER): Payer: Commercial Managed Care - PPO

## 2014-03-13 ENCOUNTER — Telehealth: Payer: Self-pay | Admitting: Internal Medicine

## 2014-03-13 ENCOUNTER — Ambulatory Visit (HOSPITAL_BASED_OUTPATIENT_CLINIC_OR_DEPARTMENT_OTHER): Payer: Commercial Managed Care - PPO | Admitting: Oncology

## 2014-03-13 VITALS — BP 119/60 | HR 110 | Temp 97.8°F | Resp 18 | Ht 70.0 in | Wt 227.1 lb

## 2014-03-13 DIAGNOSIS — K5909 Other constipation: Secondary | ICD-10-CM

## 2014-03-13 DIAGNOSIS — R5383 Other fatigue: Secondary | ICD-10-CM

## 2014-03-13 DIAGNOSIS — C8190 Hodgkin lymphoma, unspecified, unspecified site: Secondary | ICD-10-CM

## 2014-03-13 DIAGNOSIS — C8121 Mixed cellularity classical Hodgkin lymphoma, lymph nodes of head, face, and neck: Secondary | ICD-10-CM

## 2014-03-13 DIAGNOSIS — C8120 Mixed cellularity classical Hodgkin lymphoma, unspecified site: Secondary | ICD-10-CM

## 2014-03-13 LAB — CBC WITH DIFFERENTIAL/PLATELET
BASO%: 0.4 % (ref 0.0–2.0)
Basophils Absolute: 0 10*3/uL (ref 0.0–0.1)
EOS ABS: 0.2 10*3/uL (ref 0.0–0.5)
EOS%: 4.1 % (ref 0.0–7.0)
HCT: 38.5 % (ref 38.4–49.9)
HGB: 13.4 g/dL (ref 13.0–17.1)
LYMPH#: 1 10*3/uL (ref 0.9–3.3)
LYMPH%: 21.1 % (ref 14.0–49.0)
MCH: 32 pg (ref 27.2–33.4)
MCHC: 34.8 g/dL (ref 32.0–36.0)
MCV: 91.9 fL (ref 79.3–98.0)
MONO#: 0.1 10*3/uL (ref 0.1–0.9)
MONO%: 2.6 % (ref 0.0–14.0)
NEUT%: 71.8 % (ref 39.0–75.0)
NEUTROS ABS: 3.5 10*3/uL (ref 1.5–6.5)
PLATELETS: 174 10*3/uL (ref 140–400)
RBC: 4.19 10*6/uL — AB (ref 4.20–5.82)
RDW: 12.8 % (ref 11.0–14.6)
WBC: 4.9 10*3/uL (ref 4.0–10.3)

## 2014-03-13 LAB — COMPREHENSIVE METABOLIC PANEL (CC13)
ALBUMIN: 3.9 g/dL (ref 3.5–5.0)
ALT: 42 U/L (ref 0–55)
ANION GAP: 11 meq/L (ref 3–11)
AST: 29 U/L (ref 5–34)
Alkaline Phosphatase: 88 U/L (ref 40–150)
BUN: 16.9 mg/dL (ref 7.0–26.0)
CALCIUM: 9.5 mg/dL (ref 8.4–10.4)
CHLORIDE: 97 meq/L — AB (ref 98–109)
CO2: 25 meq/L (ref 22–29)
Creatinine: 1.3 mg/dL (ref 0.7–1.3)
EGFR: 64 mL/min/{1.73_m2} — ABNORMAL LOW (ref >90)
Glucose: 236 mg/dl — ABNORMAL HIGH (ref 70–140)
POTASSIUM: 4.7 meq/L (ref 3.5–5.1)
SODIUM: 132 meq/L — AB (ref 136–145)
TOTAL PROTEIN: 7.7 g/dL (ref 6.4–8.3)
Total Bilirubin: 0.84 mg/dL (ref 0.20–1.20)

## 2014-03-13 LAB — URIC ACID (CC13): URIC ACID, SERUM: 3 mg/dL (ref 2.6–7.4)

## 2014-03-13 NOTE — Telephone Encounter (Signed)
gv adn printed appts ched and avs for pt for March....per MD he will be here on 3.18

## 2014-03-13 NOTE — Progress Notes (Signed)
[place] [phone]   [phone]  OFFICE PROGRESS NOTE  Pcp Not In System No address on file  DIAGNOSIS: Recurrent Hodgkin's lymphoma initially diagnosed as Stage III classical Hodgkin's lymphoma, mixed cellularity subtype diagnosed in December 2014.  PRIOR THERAPY: Status post 6 cycles of systemic chemotherapy with ABVD complicated with pulmonary fibrosis, last dose was given 07/14/2013  CURRENT THERAPY: Salvage chemotherapy with ICE (ifosfamide/mesna, carboplatin and etoposide) first cycle started on 03/06/2014.  INTERVAL HISTORY:  [name] [name] 49 y.o. male returns to the clinic today for follow-up visit. He was admitted to the hospital last week to receive his first cycle with ICE. Hearing very fatigued and feels like he is in a fog. He stop by work this morning to drop as things off before coming here and reports that he does not think he can go back to work and focused today. He reports that small activities make him very tired. He does report constipation, but has taken medication for this this morning. He denies chest pain, shortness of breath, abdominal pain, nausea, vomiting, and diarrhea. Denies mucositis. Denies fevers and night sweats.  MEDICAL HISTORY: Past Medical History  Diagnosis Date  . Hypertension   . Diabetes mellitus without complication     Pre-diabetes  . Depression   . Headache(784.0)   . Thyroid disease     Hx: of  . Cancer 2015    cervical lymphadenopathy  . Hodgkin lymphoma 2015  . Hepatic artery aneurysm 2015    ALLERGIES:  has No Known Allergies.  MEDICATIONS:  Current Outpatient Prescriptions  Medication Sig Dispense Refill  . allopurinol (ZYLOPRIM) 100 MG tablet Take 1 tablet (100 mg total) by mouth 2 (two) times daily. 60 tablet 3  . Azelastine-Fluticasone 137-50 MCG/ACT SUSP Place 1 spray into the nose 2 (two) times daily.    . carvedilol (COREG) 6.25 MG tablet TAKE ONE TABLET BY MOUTH TWICE  DAILY 60 tablet 0  . eszopiclone (LUNESTA) 2 MG TABS tablet Take 2 mg by mouth at bedtime as needed for sleep. Take immediately before bedtime    . fenofibrate (TRICOR) 145 MG tablet TAKE ONE TABLET BY MOUTH ONCE DAILY 30 tablet 1  . fexofenadine-pseudoephedrine (ALLEGRA-D 24) 180-240 MG per 24 hr tablet Take 1 tablet by mouth daily.    Marland Kitchen levothyroxine (SYNTHROID, LEVOTHROID) 200 MCG tablet Take 200 mcg by mouth daily before breakfast.    . lisinopril (PRINIVIL,ZESTRIL) 10 MG tablet Take 10 mg by mouth daily with breakfast.     . lovastatin (ALTOPREV) 40 MG 24 hr tablet Take 40 mg by mouth at bedtime.    . metFORMIN (GLUCOPHAGE) 500 MG tablet Take 1,000 mg by mouth 2 (two) times daily.     . montelukast (SINGULAIR) 10 MG tablet Take 10 mg by mouth at bedtime.    Marland Kitchen OVER THE COUNTER MEDICATION Take 2.5 mLs by mouth daily. Isotonix Antioxidant Powder    . oxyCODONE (OXY IR/ROXICODONE) 5 MG immediate release tablet Take 1 tablet (5 mg total) by mouth every 6 (six) hours as needed for severe pain. 60 tablet 0  . potassium chloride (K-DUR,KLOR-CON) 10 MEQ tablet Take 10 mEq by mouth daily.     . prochlorperazine (COMPAZINE) 10 MG tablet Take 1 tablet (10 mg total) by mouth every 6 (six) hours as needed for nausea or vomiting. 30 tablet 1  . sertraline (ZOLOFT) 100 MG tablet Take 100 mg by mouth daily with breakfast.  No current facility-administered medications for this visit.    SURGICAL HISTORY:  Past Surgical History  Procedure Laterality Date  . Tonsillectomy    . Mass excision Left 12/31/2012    Procedure: CERVICAL LYMPH NODE BIOPSY;  Surgeon: [name], MD;  Location: [place];  Service: General;  Laterality: Left;    REVIEW OF SYSTEMS:  Constitutional: positive for fatigue, negative for anorexia, chills, fevers and night sweats Eyes: negative Ears, nose, mouth, throat, and face: negative Respiratory: negative Cardiovascular: negative Gastrointestinal:  negative Genitourinary:negative Integument/breast: negative Hematologic/lymphatic: negative Musculoskeletal:negative Neurological: negative Behavioral/Psych: negative Endocrine: negative Allergic/Immunologic: negative   PHYSICAL EXAMINATION: General appearance: alert, cooperative and no distress Head: Normocephalic, without obvious abnormality, atraumatic Neck: no JVD, supple, symmetrical, trachea midline, thyroid not enlarged, symmetric, no tenderness/mass/nodules and Palpable small left lower cervical lymph node, firm to palpation Lymph nodes: Palpable small left lower cervical lymph node, firm to palpation Resp: clear to auscultation bilaterally Back: symmetric, no curvature. ROM normal. No CVA tenderness. Cardio: regular rate and rhythm, S1, S2 normal, no murmur, click, rub or gallop GI: soft, non-tender; bowel sounds normal; no masses,  no organomegaly Extremities: extremities normal, atraumatic, no cyanosis or edema Neurologic: Alert and oriented X 3, normal strength and tone. Normal symmetric reflexes. Normal coordination and gait  ECOG PERFORMANCE STATUS: 0 - Asymptomatic  Blood pressure 119/60, pulse 110, temperature 97.8 F (36.6 C), temperature source Oral, resp. rate 18, height 5\' 10"  (1.778 m), weight 227 lb 1.6 oz (103.012 kg), SpO2 97 %.  LABORATORY DATA: Lab Results  Component Value Date   WBC 4.9 03/13/2014   HGB 13.4 03/13/2014   HCT 38.5 03/13/2014   MCV 91.9 03/13/2014   PLT 174 03/13/2014      Chemistry      Component Value Date/Time   NA 132* 03/13/2014 0911   NA 133* 03/09/2014 0435   K 4.7 03/13/2014 0911   K 4.0 03/09/2014 0435   CL 99 03/09/2014 0435   CO2 25 03/13/2014 0911   CO2 26 03/09/2014 0435   BUN 16.9 03/13/2014 0911   BUN 21 03/09/2014 0435   CREATININE 1.3 03/13/2014 0911   CREATININE 0.92 03/09/2014 0435      Component Value Date/Time   CALCIUM 9.5 03/13/2014 0911   CALCIUM 9.2 03/09/2014 0435   ALKPHOS 88 03/13/2014 0911    ALKPHOS 47 03/09/2014 0435   AST 29 03/13/2014 0911   AST 27 03/09/2014 0435   ALT 42 03/13/2014 0911   ALT 38 03/09/2014 0435   BILITOT 0.84 03/13/2014 0911   BILITOT 0.8 03/09/2014 0435       RADIOGRAPHIC STUDIES: Nm Pet Image Restag (ps) Skull Base To Thigh  01/26/2014   CLINICAL DATA:  Initial treatment strategy for restaging of Hodgkin's lymphoma.  EXAM: NUCLEAR MEDICINE PET SKULL BASE TO THIGH  TECHNIQUE: 11.1 mCi F-18 FDG was injected intravenously. Full-ring PET imaging was performed from the skull base to thigh after the radiotracer. CT data was obtained and used for attenuation correction and anatomic localization.  FASTING BLOOD GLUCOSE:  Value: 177 mg/dl  COMPARISON:  07/28/2013.  Diagnostic CTs of 01/17/2014.  FINDINGS: NECK  Hypermetabolic cervical adenopathy. Index left low jugular node measures 1.5 cm and a S.U.V. max of 5.1. ([place] 4) This node is enlarged and increased in hypermetabolism. Measured 9 mm and a S.U.V. max of 1.2 on the prior.  CHEST  Hypermetabolic bilateral axillary adenopathy. Index left axillary node measures 2.0 cm and a S.U.V. max of 8.9 (Deauville 4).  These nodes are enlarged and increased in hypermetabolism since the prior exam. Hypermetabolic mediastinal nodes are also identified.  ABDOMEN/PELVIS  Index left low periaortic node measures 2.1 cm and a S.U.V. max of 9.6 . On the prior exam, this measured 1.7 cm and a S.U.V. max of 2.1.  SKELETON  No abnormal marrow activity.  CT IMAGES PERFORMED FOR ATTENUATION CORRECTION  Findings which are deferred to recent diagnostic CTs of the neck, chest, abdomen, and pelvis. LAD coronary artery atherosclerosis. Hepatic steatosis. Calcified mass in the porta hepatis which may represent a remote pseudocyst or postoperative hematoma (surgical clips in this region).  IMPRESSION: 1. Since 07/28/2013, progression of lymphoma within the neck, chest, abdomen/pelvis. 2. Hepatic steatosis. 3. Age advanced coronary artery  atherosclerosis. Recommend assessment of coronary risk factors and consideration of medical therapy.   Electronically Signed   By: Abigail Miyamoto M.D.   On: 01/26/2014 10:18   ASSESSMENT AND PLAN: This is a very pleasant 49 year old white male with history of stage III Hodgkin's lymphoma status post 6 cycles of systemic chemotherapy with ABVD with almost complete response. Unfortunately the recent imaging studies as well as physical exam showed evidence for disease recurrence. He is now receiving salvage chemotherapy with ICE and is status post 1 cycle. He is having fatigue and constipation, but has overall tolerated his chemotherapy well. Labs are stable.  The plan is for at least 2 cycles before reevaluation with imaging studies and consideration of stem cell transplant if he has complete response.  We will continue to monitor him closely with weekly lab work. He is scheduled for his second cycle 2 began on March 14, but he wishes to delay this to March 21 due to having company in from out of state. We will plan to bring him back for follow-up on March 18 in anticipation for admission on March 21.  For the history of pulmonary fibrosis, the patient will continue his routine follow-up visit with Dr. Lake Bells  He was advised to call immediately if he has any concerning symptoms in the interval.  All questions were answered. The patient knows to call the clinic with any problems, questions or concerns. We can certainly see the patient much sooner if necessary.  The patient was seen and examined with Dr. Julien Nordmann.  Mikey Bussing, DNP, AGPCNP-BC, AOCNP    ADDENDUM: Hematology/Oncology Attending: I had a face to face encounter with the patient. I recommended his care plan. This is a very pleasant 50 years old white male with recurrent Hodgkin's lymphoma currently undergoing salvage chemotherapy with ICE, status post 1 cycle completed recently at [facility]. This was followed by Neulasta  injection. The patient is feeling fine today except for aching pain and fatigue from the Neulasta injection. He was supposed to start cycle #2 of his chemotherapy on 03/27/2014 but the patient would like to delay his treatment by one week because he has family members visiting at that time.  He will continue to have weekly lab to monitor his treatment. He would come back for follow-up visit in 3 weeks for reevaluation before the second cycle of his treatment. He was advised to call immediately if he has any concerning symptoms in the interval.  Disclaimer: This note was dictated with voice recognition software. Similar sounding words can inadvertently be transcribed and may be missed upon review. Eilleen Kempf., MD 03/13/2014

## 2014-03-16 ENCOUNTER — Encounter: Payer: Self-pay | Admitting: Internal Medicine

## 2014-03-16 ENCOUNTER — Other Ambulatory Visit: Payer: Self-pay | Admitting: Medical Oncology

## 2014-03-16 DIAGNOSIS — C8120 Mixed cellularity classical Hodgkin lymphoma, unspecified site: Secondary | ICD-10-CM

## 2014-03-16 MED ORDER — OXYCODONE HCL 5 MG PO TABS: 5.0000 mg | ORAL_TABLET | Freq: Four times a day (QID) | ORAL | Status: AC | PRN

## 2014-03-16 NOTE — Progress Notes (Signed)
rx locked up in injection room . Pt notified

## 2014-03-17 ENCOUNTER — Encounter: Payer: Self-pay | Admitting: Internal Medicine

## 2014-03-17 ENCOUNTER — Telehealth: Payer: Self-pay | Admitting: *Deleted

## 2014-03-17 NOTE — Telephone Encounter (Signed)
Called pt regarding email " trouble swallowing". Pt denies difficulty breathing, fever, chills, thrush, swelling, cough. Pt is no having any difficulty swallowing food. 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 Advised pt to go to urgent care or ED if has any difficulty breathing. Pt verbalized understanding no further concerns at this time.

## 2014-03-20 ENCOUNTER — Other Ambulatory Visit (HOSPITAL_BASED_OUTPATIENT_CLINIC_OR_DEPARTMENT_OTHER): Payer: Commercial Managed Care - PPO

## 2014-03-20 DIAGNOSIS — C8121 Mixed cellularity classical Hodgkin lymphoma, lymph nodes of head, face, and neck: Secondary | ICD-10-CM

## 2014-03-20 DIAGNOSIS — C8190 Hodgkin lymphoma, unspecified, unspecified site: Secondary | ICD-10-CM

## 2014-03-20 LAB — CBC WITH DIFFERENTIAL/PLATELET
BASO%: 0.7 % (ref 0.0–2.0)
BASOS ABS: 0.2 10*3/uL — AB (ref 0.0–0.1)
EOS%: 0.1 % (ref 0.0–7.0)
Eosinophils Absolute: 0 10*3/uL (ref 0.0–0.5)
HCT: 37 % — ABNORMAL LOW (ref 38.4–49.9)
HEMOGLOBIN: 12.3 g/dL — AB (ref 13.0–17.1)
LYMPH#: 6.2 10*3/uL — AB (ref 0.9–3.3)
LYMPH%: 22 % (ref 14.0–49.0)
MCH: 31 pg (ref 27.2–33.4)
MCHC: 33.2 g/dL (ref 32.0–36.0)
MCV: 93.4 fL (ref 79.3–98.0)
MONO#: 3.2 10*3/uL — ABNORMAL HIGH (ref 0.1–0.9)
MONO%: 11.4 % (ref 0.0–14.0)
NEUT#: 18.7 10*3/uL — ABNORMAL HIGH (ref 1.5–6.5)
NEUT%: 65.8 % (ref 39.0–75.0)
Platelets: 116 10*3/uL — ABNORMAL LOW (ref 140–400)
RBC: 3.96 10*6/uL — AB (ref 4.20–5.82)
RDW: 13 % (ref 11.0–14.6)
WBC: 28.3 10*3/uL — ABNORMAL HIGH (ref 4.0–10.3)

## 2014-03-20 LAB — COMPREHENSIVE METABOLIC PANEL (CC13)
ALBUMIN: 3.6 g/dL (ref 3.5–5.0)
ALT: 59 U/L — ABNORMAL HIGH (ref 0–55)
ANION GAP: 9 meq/L (ref 3–11)
AST: 35 U/L — ABNORMAL HIGH (ref 5–34)
Alkaline Phosphatase: 120 U/L (ref 40–150)
BUN: 13.7 mg/dL (ref 7.0–26.0)
CO2: 25 meq/L (ref 22–29)
Calcium: 9.4 mg/dL (ref 8.4–10.4)
Chloride: 98 mEq/L (ref 98–109)
Creatinine: 1.2 mg/dL (ref 0.7–1.3)
EGFR: 69 mL/min/{1.73_m2} — AB (ref >90)
GLUCOSE: 296 mg/dL — AB (ref 70–140)
POTASSIUM: 4.9 meq/L (ref 3.5–5.1)
Sodium: 133 mEq/L — ABNORMAL LOW (ref 136–145)
Total Bilirubin: 0.25 mg/dL (ref 0.20–1.20)
Total Protein: 7.6 g/dL (ref 6.4–8.3)

## 2014-03-20 LAB — URIC ACID (CC13): Uric Acid, Serum: 3.1 mg/dl (ref 2.6–7.4)

## 2014-03-21 ENCOUNTER — Encounter: Payer: Self-pay | Admitting: Internal Medicine

## 2014-03-22 ENCOUNTER — Other Ambulatory Visit (HOSPITAL_BASED_OUTPATIENT_CLINIC_OR_DEPARTMENT_OTHER): Payer: Commercial Managed Care - PPO

## 2014-03-22 ENCOUNTER — Encounter: Payer: Self-pay | Admitting: Internal Medicine

## 2014-03-22 ENCOUNTER — Ambulatory Visit (HOSPITAL_BASED_OUTPATIENT_CLINIC_OR_DEPARTMENT_OTHER): Payer: Commercial Managed Care - PPO | Admitting: Nurse Practitioner

## 2014-03-22 ENCOUNTER — Telehealth: Payer: Self-pay | Admitting: *Deleted

## 2014-03-22 ENCOUNTER — Encounter: Payer: Self-pay | Admitting: *Deleted

## 2014-03-22 VITALS — BP 122/84 | HR 109 | Temp 98.3°F | Resp 20 | Wt 227.7 lb

## 2014-03-22 DIAGNOSIS — C8120 Mixed cellularity classical Hodgkin lymphoma, unspecified site: Secondary | ICD-10-CM | POA: Diagnosis not present

## 2014-03-22 DIAGNOSIS — L0291 Cutaneous abscess, unspecified: Secondary | ICD-10-CM | POA: Diagnosis not present

## 2014-03-22 LAB — CBC WITH DIFFERENTIAL/PLATELET
BASO%: 0.4 % (ref 0.0–2.0)
BASOS ABS: 0.1 10*3/uL (ref 0.0–0.1)
EOS%: 0.1 % (ref 0.0–7.0)
Eosinophils Absolute: 0 10*3/uL (ref 0.0–0.5)
HEMATOCRIT: 34.8 % — AB (ref 38.4–49.9)
HEMOGLOBIN: 12.1 g/dL — AB (ref 13.0–17.1)
LYMPH#: 7 10*3/uL — AB (ref 0.9–3.3)
LYMPH%: 26.9 % (ref 14.0–49.0)
MCH: 32 pg (ref 27.2–33.4)
MCHC: 34.8 g/dL (ref 32.0–36.0)
MCV: 92.1 fL (ref 79.3–98.0)
MONO#: 2.5 10*3/uL — ABNORMAL HIGH (ref 0.1–0.9)
MONO%: 9.8 % (ref 0.0–14.0)
NEUT%: 62.8 % (ref 39.0–75.0)
NEUTROS ABS: 16.3 10*3/uL — AB (ref 1.5–6.5)
Platelets: 166 10*3/uL (ref 140–400)
RBC: 3.78 10*6/uL — ABNORMAL LOW (ref 4.20–5.82)
RDW: 12.7 % (ref 11.0–14.6)
WBC: 25.9 10*3/uL — AB (ref 4.0–10.3)

## 2014-03-22 MED ORDER — CEPHALEXIN 500 MG PO CAPS: 500.0000 mg | ORAL_CAPSULE | Freq: Four times a day (QID) | ORAL | Status: DC

## 2014-03-22 NOTE — Telephone Encounter (Signed)
Reviewed pt concerns with MD who advised pt to see Symptom management clinic for folliculitis, temporary handicapp stick x8mths can be issued to pt. Called pt advised of above information to expect a cal from scheduling for symptom management appt. Pt verbalized understanding. No further concerns.

## 2014-03-23 ENCOUNTER — Encounter: Payer: Self-pay | Admitting: Nurse Practitioner

## 2014-03-23 DIAGNOSIS — L0291 Cutaneous abscess, unspecified: Secondary | ICD-10-CM | POA: Insufficient documentation

## 2014-03-23 NOTE — Progress Notes (Signed)
SYMPTOM MANAGEMENT CLINIC   HPI: [name] [name] 49 y.o. male diagnosed with recurrent Hodgkin's lymphoma.  Patient is status post 6 cycles of ABVD chemotherapy completed in July 2015.  Currently undergoing ifosfamide/mesna/carboplatin/etoposide chemotherapy therapy regimen.  Patient completed cycle one of his chemotherapy while admitted on 03/09/2014.  He called the cancer Center today requesting urgent care visit.  He states he has a problem with recurrent folliculitis lesions in the past.  He states he is now developed a severe right groin abscess that is quite painful for him.  He states that the discomfort has made it difficult for him to ambulate.  He denies any recent fevers or chills.   HPI  ROS  Past Medical History  Diagnosis Date  . Hypertension   . Diabetes mellitus without complication     Pre-diabetes  . Depression   . Headache(784.0)   . Thyroid disease     Hx: of  . Cancer 2015    cervical lymphadenopathy  . Hodgkin lymphoma 2015  . Hepatic artery aneurysm 2015    Past Surgical History  Procedure Laterality Date  . Tonsillectomy    . Mass excision Left 12/31/2012    Procedure: CERVICAL LYMPH NODE BIOPSY;  Surgeon: [name], MD;  Location: [place];  Service: General;  Laterality: Left;    has Cervical lymphadenopathy; Hodgkin's disease with mixed cellularity; DM (diabetes mellitus); Hyperlipemia; Obesity; Hypothyroidism; Aneurysm artery, hepatic; Family history of cancer; Diastolic dysfunction; Thyroid lesion; Essential hypertension; At risk for coronary artery disease; Chemotherapy-induced cardiomyopathy; Abnormal CT scan, chest; Shortness of breath; Lymphoma; and Abscess on his problem list.    has No Known Allergies.    Medication List       This list is accurate as of: 03/22/14 11:59 PM.  Always use your most recent med list.               allopurinol 100 MG tablet  Commonly known as:  ZYLOPRIM  Take 1 tablet (100 mg total) by mouth 2 (two)  times daily.     Azelastine-Fluticasone 137-50 MCG/ACT Susp  Place 1 spray into the nose 2 (two) times daily.     carvedilol 6.25 MG tablet  Commonly known as:  COREG  TAKE ONE TABLET BY MOUTH TWICE DAILY     cephALEXin 500 MG capsule  Commonly known as:  KEFLEX  Take 1 capsule (500 mg total) by mouth 4 (four) times daily.     eszopiclone 2 MG Tabs tablet  Commonly known as:  LUNESTA  Take 2 mg by mouth at bedtime as needed for sleep. Take immediately before bedtime     fenofibrate 145 MG tablet  Commonly known as:  TRICOR  TAKE ONE TABLET BY MOUTH ONCE DAILY     fexofenadine-pseudoephedrine 180-240 MG per 24 hr tablet  Commonly known as:  ALLEGRA-D 24  Take 1 tablet by mouth daily.     levothyroxine 200 MCG tablet  Commonly known as:  SYNTHROID, LEVOTHROID  Take 200 mcg by mouth daily before breakfast.     lisinopril 10 MG tablet  Commonly known as:  PRINIVIL,ZESTRIL  Take 10 mg by mouth daily with breakfast.     lovastatin 40 MG 24 hr tablet  Commonly known as:  ALTOPREV  Take 40 mg by mouth at bedtime.     metFORMIN 500 MG tablet  Commonly known as:  GLUCOPHAGE  Take 1,000 mg by mouth 2 (two) times daily.     montelukast 10 MG tablet  Commonly known as:  SINGULAIR  Take 10 mg by mouth at bedtime.     OVER THE COUNTER MEDICATION  Take 2.5 mLs by mouth daily. Isotonix Antioxidant Powder     oxyCODONE 5 MG immediate release tablet  Commonly known as:  Oxy IR/ROXICODONE  Take 1 tablet (5 mg total) by mouth every 6 (six) hours as needed for severe pain.     potassium chloride 10 MEQ tablet  Commonly known as:  K-DUR,KLOR-CON  Take 10 mEq by mouth daily.     prochlorperazine 10 MG tablet  Commonly known as:  COMPAZINE  Take 1 tablet (10 mg total) by mouth every 6 (six) hours as needed for nausea or vomiting.     sertraline 100 MG tablet  Commonly known as:  ZOLOFT  Take 100 mg by mouth daily with breakfast.         PHYSICAL EXAMINATION  Oncology  Vitals 03/22/2014 03/13/2014 03/10/2014 03/09/2014 03/08/2014 03/08/2014 03/08/2014  Height - 178 cm - - - - -  Weight 103.284 kg 103.012 kg - - - - -  Weight (lbs) 227 lbs 11 oz 227 lbs 2 oz - - - - -  BMI (kg/m2) - 32.59 kg/m2 - - - - -  Temp 98.3 97.8 98.2 97.4 98.7 97.7 97.4  Pulse 109 110 105 61 74 87 80  Resp 20 18 - _0 SpO2 98 97 - 100 94 97 100  BSA (m2) - 2.26 m2 - - - - -   BP Readings from Last 3 Encounters:  03/22/14 122/84  03/13/14 119/60  03/10/14 123/89    Physical Exam  Constitutional: He is oriented to person, place, and time and well-developed, well-nourished, and in no distress.  HENT:  Head: Normocephalic and atraumatic.  Eyes: Conjunctivae and EOM are normal. Pupils are equal, round, and reactive to light.  Neck: Normal range of motion.  Pulmonary/Chest: Effort normal. No respiratory distress.  Musculoskeletal: Normal range of motion.  Neurological: He is alert and oriented to person, place, and time.  Skin: Skin is warm and dry. No rash noted. There is erythema. No pallor.  Patient has an approximately 5-6 cm in diameter large abscess to his right groin region.  Surrounding area with erythema, edema, warmth, and increased tenderness.  No red streaks noted to site.  Also, radiation has an approximately 0.5 cm in diameter pustule lesion to his right thigh as well.  Psychiatric: Affect normal.  Nursing note and vitals reviewed.   LABORATORY DATA:. Appointment on 03/22/2014  Component Date Value Ref Range Status  . WBC 03/22/2014 25.9* 4.0 - 10.3 10e3/uL Final  . NEUT# 03/22/2014 16.3* 1.5 - 6.5 10e3/uL Final  . HGB 03/22/2014 12.1* 13.0 - 17.1 g/dL Final  . HCT 03/22/2014 34.8* 38.4 - 49.9 % Final  . Platelets 03/22/2014 166  140 - 400 10e3/uL Final  . MCV 03/22/2014 92.1  79.3 - 98.0 fL Final  . MCH 03/22/2014 32.0  27.2 - 33.4 pg Final  . MCHC 03/22/2014 34.8  32.0 - 36.0 g/dL Final  . RBC 03/22/2014 3.78* 4.20 - 5.82 10e6/uL Final  . RDW  03/22/2014 12.7  11.0 - 14.6 % Final  . lymph# 03/22/2014 7.0* 0.9 - 3.3 10e3/uL Final  . MONO# 03/22/2014 2.5* 0.1 - 0.9 10e3/uL Final  . Eosinophils Absolute 03/22/2014 0.0  0.0 - 0.5 10e3/uL Final  . Basophils Absolute 03/22/2014 0.1  0.0 - 0.1 10e3/uL Final  . NEUT% 03/22/2014 62.8  39.0 -  75.0 % Final  . LYMPH% 03/22/2014 26.9  14.0 - 49.0 % Final  . MONO% 03/22/2014 9.8  0.0 - 14.0 % Final  . EOS% 03/22/2014 0.1  0.0 - 7.0 % Final  . BASO% 03/22/2014 0.4  0.0 - 2.0 % Final  Appointment on 03/20/2014  Component Date Value Ref Range Status  . WBC 03/20/2014 28.3* 4.0 - 10.3 10e3/uL Final  . NEUT# 03/20/2014 18.7* 1.5 - 6.5 10e3/uL Final  . HGB 03/20/2014 12.3* 13.0 - 17.1 g/dL Final  . HCT 03/20/2014 37.0* 38.4 - 49.9 % Final  . Platelets 03/20/2014 116* 140 - 400 10e3/uL Final  . MCV 03/20/2014 93.4  79.3 - 98.0 fL Final  . MCH 03/20/2014 31.0  27.2 - 33.4 pg Final  . MCHC 03/20/2014 33.2  32.0 - 36.0 g/dL Final  . RBC 03/20/2014 3.96* 4.20 - 5.82 10e6/uL Final  . RDW 03/20/2014 13.0  11.0 - 14.6 % Final  . lymph# 03/20/2014 6.2* 0.9 - 3.3 10e3/uL Final  . MONO# 03/20/2014 3.2* 0.1 - 0.9 10e3/uL Final  . Eosinophils Absolute 03/20/2014 0.0  0.0 - 0.5 10e3/uL Final  . Basophils Absolute 03/20/2014 0.2* 0.0 - 0.1 10e3/uL Final  . NEUT% 03/20/2014 65.8  39.0 - 75.0 % Final  . LYMPH% 03/20/2014 22.0  14.0 - 49.0 % Final  . MONO% 03/20/2014 11.4  0.0 - 14.0 % Final  . EOS% 03/20/2014 0.1  0.0 - 7.0 % Final  . BASO% 03/20/2014 0.7  0.0 - 2.0 % Final  . Sodium 03/20/2014 133* 136 - 145 mEq/L Final  . Potassium 03/20/2014 4.9  3.5 - 5.1 mEq/L Final  . Chloride 03/20/2014 98  98 - 109 mEq/L Final  . CO2 03/20/2014 25  22 - 29 mEq/L Final  . Glucose 03/20/2014 296* 70 - 140 mg/dl Final  . BUN 03/20/2014 13.7  7.0 - 26.0 mg/dL Final  . Creatinine 03/20/2014 1.2  0.7 - 1.3 mg/dL Final  . Total Bilirubin 03/20/2014 0.25  0.20 - 1.20 mg/dL Final  . Alkaline Phosphatase 03/20/2014 120   40 - 150 U/L Final  . AST 03/20/2014 35* 5 - 34 U/L Final  . ALT 03/20/2014 59* 0 - 55 U/L Final  . Total Protein 03/20/2014 7.6  6.4 - 8.3 g/dL Final  . Albumin 03/20/2014 3.6  3.5 - 5.0 g/dL Final  . Calcium 03/20/2014 9.4  8.4 - 10.4 mg/dL Final  . Anion Gap 03/20/2014 9  3 - 11 mEq/L Final  . EGFR 03/20/2014 69* >90 ml/min/1.73 m2 Final   eGFR is calculated using the CKD-EPI Creatinine Equation (2009)  . Uric Acid, Serum 03/20/2014 3.1  2.6 - 7.4 mg/dl Final     RADIOGRAPHIC STUDIES: No results found.  ASSESSMENT/PLAN:    Abscess Patient reports a chronic history of intermittent folliculitis lesions in the past.  He now has a large right groin abscess.  He reports that the abscess site pain is so severe; that it is difficult for him to ambulate.  On exam-patient has a large approximately 5 cm in diameter right groin abscess with erythema, edema, mild warmth, and tenderness to site.  No red streaks noted.  It does appear that the abscess area has drained a small amount in the past.  Also, patient has one 0.5 cm in diameter purulent lesion directly below the abscess site as well.  Prescribed Keflex for the patient to start this evening.  Have also arranged for patient to see general surgeon Dr. Lucia Gaskins tomorrow 03/23/2014  for further evaluation and management of the abscess site.   Hodgkin's disease with mixed cellularity Patient is status post 6 cycles of ABVD chemotherapy completed in July 2015.  He is currently undergoing salvage chemotherapy with ifosfamide/mesna/carboplatin etoposide.  He completed cycle 1 of this chemotherapy regimen while admitted on 03/09/2014.  He will return to the [facility] on 03/31/2014 for labs and a follow-up visit.  He will then be admitted once again on 04/03/2014 for cycle 2 of the same chemotherapy regimen.  He will also continue with weekly labs as previously directed.   Patient stated understanding of all instructions; and was in agreement  with this plan of care. The patient knows to call the clinic with any problems, questions or concerns.   Review/collaboration with Dr. Julien Nordmann regarding all aspects of patient's visit today.   Total time spent with patient was 25 minutes;  with greater than 75 percent of that time spent in face to face counseling regarding patient's symptoms,  and coordination of care and follow up.  Disclaimer: This note was dictated with voice recognition software. Similar sounding words can inadvertently be transcribed and may not be corrected upon review.   Drue Second, NP 03/23/2014

## 2014-03-23 NOTE — Assessment & Plan Note (Signed)
Patient is status post 6 cycles of ABVD chemotherapy completed in July 2015.  He is currently undergoing salvage chemotherapy with ifosfamide/mesna/carboplatin etoposide.  He completed cycle 1 of this chemotherapy regimen while admitted on 03/09/2014.  He will return to the [facility] on 03/31/2014 for labs and a follow-up visit.  He will then be admitted once again on 04/03/2014 for cycle 2 of the same chemotherapy regimen.  He will also continue with weekly labs as previously directed.

## 2014-03-23 NOTE — Assessment & Plan Note (Signed)
Patient reports a chronic history of intermittent folliculitis lesions in the past.  He now has a large right groin abscess.  He reports that the abscess site pain is so severe; that it is difficult for him to ambulate.  On exam-patient has a large approximately 5 cm in diameter right groin abscess with erythema, edema, mild warmth, and tenderness to site.  No red streaks noted.  It does appear that the abscess area has drained a small amount in the past.  Also, patient has one 0.5 cm in diameter purulent lesion directly below the abscess site as well.  Prescribed Keflex for the patient to start this evening.  Have also arranged for patient to see general surgeon Dr. Lucia Gaskins tomorrow 03/23/2014 for further evaluation and management of the abscess site.

## 2014-03-27 ENCOUNTER — Other Ambulatory Visit: Payer: Commercial Managed Care - PPO

## 2014-03-31 ENCOUNTER — Encounter: Payer: Self-pay | Admitting: Internal Medicine

## 2014-03-31 ENCOUNTER — Ambulatory Visit: Payer: Commercial Managed Care - PPO

## 2014-03-31 ENCOUNTER — Ambulatory Visit (HOSPITAL_BASED_OUTPATIENT_CLINIC_OR_DEPARTMENT_OTHER): Payer: Commercial Managed Care - PPO | Admitting: Internal Medicine

## 2014-03-31 ENCOUNTER — Telehealth: Payer: Self-pay | Admitting: Internal Medicine

## 2014-03-31 ENCOUNTER — Other Ambulatory Visit (HOSPITAL_BASED_OUTPATIENT_CLINIC_OR_DEPARTMENT_OTHER): Payer: Commercial Managed Care - PPO

## 2014-03-31 VITALS — BP 120/80 | HR 77 | Temp 97.6°F | Resp 18 | Ht 70.0 in | Wt 225.7 lb

## 2014-03-31 DIAGNOSIS — C8120 Mixed cellularity classical Hodgkin lymphoma, unspecified site: Secondary | ICD-10-CM

## 2014-03-31 DIAGNOSIS — C8190 Hodgkin lymphoma, unspecified, unspecified site: Secondary | ICD-10-CM

## 2014-03-31 LAB — CBC WITH DIFFERENTIAL/PLATELET
BASO%: 1.7 % (ref 0.0–2.0)
Basophils Absolute: 0.2 10*3/uL — ABNORMAL HIGH (ref 0.0–0.1)
EOS ABS: 0 10*3/uL (ref 0.0–0.5)
EOS%: 0.3 % (ref 0.0–7.0)
HEMATOCRIT: 34.2 % — AB (ref 38.4–49.9)
HEMOGLOBIN: 11.8 g/dL — AB (ref 13.0–17.1)
LYMPH%: 40.7 % (ref 14.0–49.0)
MCH: 31.9 pg (ref 27.2–33.4)
MCHC: 34.5 g/dL (ref 32.0–36.0)
MCV: 92.4 fL (ref 79.3–98.0)
MONO#: 1.2 10*3/uL — ABNORMAL HIGH (ref 0.1–0.9)
MONO%: 10.9 % (ref 0.0–14.0)
NEUT%: 46.4 % (ref 39.0–75.0)
NEUTROS ABS: 5 10*3/uL (ref 1.5–6.5)
PLATELETS: 363 10*3/uL (ref 140–400)
RBC: 3.7 10*6/uL — ABNORMAL LOW (ref 4.20–5.82)
RDW: 13.6 % (ref 11.0–14.6)
WBC: 10.7 10*3/uL — ABNORMAL HIGH (ref 4.0–10.3)
lymph#: 4.4 10*3/uL — ABNORMAL HIGH (ref 0.9–3.3)

## 2014-03-31 LAB — COMPREHENSIVE METABOLIC PANEL (CC13)
ALK PHOS: 72 U/L (ref 40–150)
ALT: 42 U/L (ref 0–55)
AST: 34 U/L (ref 5–34)
Albumin: 3.7 g/dL (ref 3.5–5.0)
Anion Gap: 9 mEq/L (ref 3–11)
BUN: 13.4 mg/dL (ref 7.0–26.0)
CALCIUM: 9.5 mg/dL (ref 8.4–10.4)
CO2: 25 mEq/L (ref 22–29)
CREATININE: 1.1 mg/dL (ref 0.7–1.3)
Chloride: 102 mEq/L (ref 98–109)
EGFR: 76 mL/min/{1.73_m2} — ABNORMAL LOW (ref >90)
GLUCOSE: 196 mg/dL — AB (ref 70–140)
Potassium: 4.2 mEq/L (ref 3.5–5.1)
Sodium: 135 mEq/L — ABNORMAL LOW (ref 136–145)
Total Bilirubin: 0.33 mg/dL (ref 0.20–1.20)
Total Protein: 7.8 g/dL (ref 6.4–8.3)

## 2014-03-31 LAB — URIC ACID (CC13): Uric Acid, Serum: 5.3 mg/dl (ref 2.6–7.4)

## 2014-03-31 NOTE — Progress Notes (Signed)
[place] Telephone:(336) [phone]   Fax:(336) [phone]  OFFICE PROGRESS NOTE  Pcp Not In System No address on file  DIAGNOSIS: Recurrent Hodgkin's lymphoma initially diagnosed as Stage III classical Hodgkin's lymphoma, mixed cellularity subtype diagnosed in December 2014.  PRIOR THERAPY: Status post 6 cycles of systemic chemotherapy with ABVD complicated with pulmonary fibrosis, last dose was given 07/14/2013  CURRENT THERAPY: Salvage chemotherapy with ICE (ifosfamide/mesna, carboplatin and etoposide) first dose was given 03/06/2014. Status post one cycle.  INTERVAL HISTORY: [name] [name] 49 y.o. male returns to the clinic today for follow-up visit.  He tolerated the first cycle of his systemic chemotherapy fairly well with no significant adverse effects. He was recently treated for abscess in the right groin area with incision and drainage as well as a course of antibiotics. The abscesses completely improved. He is feeling fine today with no specific complaints. He denied having any significant weight loss or night sweats. The patient denied having any chest pain, shortness of breath, cough or hemoptysis. He has no nausea or vomiting.  MEDICAL HISTORY: Past Medical History  Diagnosis Date  . Hypertension   . Diabetes mellitus without complication     Pre-diabetes  . Depression   . Headache(784.0)   . Thyroid disease     Hx: of  . Cancer 2015    cervical lymphadenopathy  . Hodgkin lymphoma 2015  . Hepatic artery aneurysm 2015    ALLERGIES:  has No Known Allergies.  MEDICATIONS:  Current Outpatient Prescriptions  Medication Sig Dispense Refill  . allopurinol (ZYLOPRIM) 100 MG tablet Take 1 tablet (100 mg total) by mouth 2 (two) times daily. (Patient not taking: Reported on 03/22/2014) 60 tablet 3  . Azelastine-Fluticasone 137-50 MCG/ACT SUSP Place 1 spray into the nose 2 (two) times daily.    . carvedilol (COREG) 6.25 MG tablet TAKE ONE TABLET BY MOUTH  TWICE DAILY 60 tablet 0  . cephALEXin (KEFLEX) 500 MG capsule Take 1 capsule (500 mg total) by mouth 4 (four) times daily. 40 capsule 0  . eszopiclone (LUNESTA) 2 MG TABS tablet Take 2 mg by mouth at bedtime as needed for sleep. Take immediately before bedtime    . fenofibrate (TRICOR) 145 MG tablet TAKE ONE TABLET BY MOUTH ONCE DAILY 30 tablet 1  . fexofenadine-pseudoephedrine (ALLEGRA-D 24) 180-240 MG per 24 hr tablet Take 1 tablet by mouth daily.    Marland Kitchen levothyroxine (SYNTHROID, LEVOTHROID) 200 MCG tablet Take 200 mcg by mouth daily before breakfast.    . lisinopril (PRINIVIL,ZESTRIL) 10 MG tablet Take 10 mg by mouth daily with breakfast.     . lovastatin (ALTOPREV) 40 MG 24 hr tablet Take 40 mg by mouth at bedtime.    . metFORMIN (GLUCOPHAGE) 500 MG tablet Take 1,000 mg by mouth 2 (two) times daily.     . montelukast (SINGULAIR) 10 MG tablet Take 10 mg by mouth at bedtime.    Marland Kitchen OVER THE COUNTER MEDICATION Take 2.5 mLs by mouth daily. Isotonix Antioxidant Powder    . oxyCODONE (OXY IR/ROXICODONE) 5 MG immediate release tablet Take 1 tablet (5 mg total) by mouth every 6 (six) hours as needed for severe pain. 60 tablet 0  . potassium chloride (K-DUR,KLOR-CON) 10 MEQ tablet Take 10 mEq by mouth daily.     . prochlorperazine (COMPAZINE) 10 MG tablet Take 1 tablet (10 mg total) by mouth every 6 (six) hours as needed for nausea or vomiting. (Patient not taking: Reported on 03/22/2014) 30  tablet 1  . sertraline (ZOLOFT) 100 MG tablet Take 100 mg by mouth daily with breakfast.      No current facility-administered medications for this visit.    SURGICAL HISTORY:  Past Surgical History  Procedure Laterality Date  . Tonsillectomy    . Mass excision Left 12/31/2012    Procedure: CERVICAL LYMPH NODE BIOPSY;  Surgeon: [name], MD;  Location: [place];  Service: General;  Laterality: Left;    REVIEW OF SYSTEMS:  Constitutional: negative Eyes: negative Ears, nose, mouth, throat, and face:  negative Respiratory: negative Cardiovascular: negative Gastrointestinal: negative Genitourinary:negative Integument/breast: negative Hematologic/lymphatic: negative Musculoskeletal:negative Neurological: negative Behavioral/Psych: negative Endocrine: negative Allergic/Immunologic: negative   PHYSICAL EXAMINATION: General appearance: alert, cooperative and no distress Head: Normocephalic, without obvious abnormality, atraumatic Neck: no JVD, supple, symmetrical, trachea midline, thyroid not enlarged, symmetric, no tenderness/mass/nodules and Palpable small left lower cervical lymph node, firm to palpation Lymph nodes: Palpable small left lower cervical lymph node, firm to palpation Resp: clear to auscultation bilaterally Back: symmetric, no curvature. ROM normal. No CVA tenderness. Cardio: regular rate and rhythm, S1, S2 normal, no murmur, click, rub or gallop GI: soft, non-tender; bowel sounds normal; no masses,  no organomegaly Extremities: extremities normal, atraumatic, no cyanosis or edema Neurologic: Alert and oriented X 3, normal strength and tone. Normal symmetric reflexes. Normal coordination and gait  ECOG PERFORMANCE STATUS: 0 - Asymptomatic  There were no vitals taken for this visit.  LABORATORY DATA: Lab Results  Component Value Date   WBC 25.9* 03/22/2014   HGB 12.1* 03/22/2014   HCT 34.8* 03/22/2014   MCV 92.1 03/22/2014   PLT 166 03/22/2014      Chemistry      Component Value Date/Time   NA 133* 03/20/2014 0929   NA 133* 03/09/2014 0435   K 4.9 03/20/2014 0929   K 4.0 03/09/2014 0435   CL 99 03/09/2014 0435   CO2 25 03/20/2014 0929   CO2 26 03/09/2014 0435   BUN 13.7 03/20/2014 0929   BUN 21 03/09/2014 0435   CREATININE 1.2 03/20/2014 0929   CREATININE 0.92 03/09/2014 0435      Component Value Date/Time   CALCIUM 9.4 03/20/2014 0929   CALCIUM 9.2 03/09/2014 0435   ALKPHOS 120 03/20/2014 0929   ALKPHOS 47 03/09/2014 0435   AST 35* 03/20/2014  0929   AST 27 03/09/2014 0435   ALT 59* 03/20/2014 0929   ALT 38 03/09/2014 0435   BILITOT 0.25 03/20/2014 0929   BILITOT 0.8 03/09/2014 0435       RADIOGRAPHIC STUDIES: No results found. ASSESSMENT AND PLAN: This is a very pleasant 49 years old white male with history of stage III Hodgkin's lymphoma status post 6 cycles of systemic chemotherapy with ABVD with almost complete response. He is currently undergoing salvage chemotherapy with ICE status post 1 cycle and tolerated the first cycle fairly well. We will proceed with cycle #2 week on an inpatient basis. The patient will receive Neulasta injection on 04/07/2014. He would come back for follow-up visit in 3 weeks for evaluation after repeating CT scan of the neck, chest, abdomen and pelvis for restaging of his disease. He was advised to call immediately if he has any concerning symptoms in the interval. The patient voices understanding of current disease status and treatment options and is in agreement with the current care plan.  All questions were answered. The patient knows to call the clinic with any problems, questions or concerns. We can certainly see the patient  much sooner if necessary.  Disclaimer: This note was dictated with voice recognition software. Similar sounding words can inadvertently be transcribed and may not be corrected upon review.

## 2014-03-31 NOTE — Telephone Encounter (Signed)
Pt confirmed labs/ov per 03/18 POF, gave pt AVS and Calendar......Marland Kitchen KJ

## 2014-04-03 ENCOUNTER — Telehealth: Payer: Self-pay | Admitting: Medical Oncology

## 2014-04-03 ENCOUNTER — Inpatient Hospital Stay (HOSPITAL_COMMUNITY)
Admission: AD | Admit: 2014-04-03 | Discharge: 2014-04-06 | DRG: 842 | Disposition: A | Discharge status: Home / Self Care | Payer: Commercial Managed Care - PPO | Source: Ambulatory Visit | Attending: Internal Medicine | Admitting: Internal Medicine

## 2014-04-03 ENCOUNTER — Encounter (HOSPITAL_COMMUNITY): Payer: Self-pay

## 2014-04-03 DIAGNOSIS — Z79899 Other long term (current) drug therapy: Secondary | ICD-10-CM

## 2014-04-03 DIAGNOSIS — E0789 Other specified disorders of thyroid: Secondary | ICD-10-CM

## 2014-04-03 DIAGNOSIS — I10 Essential (primary) hypertension: Secondary | ICD-10-CM | POA: Diagnosis present

## 2014-04-03 DIAGNOSIS — T451X5A Adverse effect of antineoplastic and immunosuppressive drugs, initial encounter: Secondary | ICD-10-CM

## 2014-04-03 DIAGNOSIS — E876 Hypokalemia: Secondary | ICD-10-CM

## 2014-04-03 DIAGNOSIS — C8190 Hodgkin lymphoma, unspecified, unspecified site: Secondary | ICD-10-CM | POA: Diagnosis not present

## 2014-04-03 DIAGNOSIS — G4700 Insomnia, unspecified: Secondary | ICD-10-CM

## 2014-04-03 DIAGNOSIS — C8120 Mixed cellularity classical Hodgkin lymphoma, unspecified site: Secondary | ICD-10-CM | POA: Diagnosis not present

## 2014-04-03 DIAGNOSIS — D630 Anemia in neoplastic disease: Secondary | ICD-10-CM | POA: Diagnosis not present

## 2014-04-03 DIAGNOSIS — C8128 Mixed cellularity classical Hodgkin lymphoma, lymph nodes of multiple sites: Secondary | ICD-10-CM | POA: Diagnosis not present

## 2014-04-03 DIAGNOSIS — E119 Type 2 diabetes mellitus without complications: Secondary | ICD-10-CM | POA: Diagnosis present

## 2014-04-03 DIAGNOSIS — F32A Depression, unspecified: Secondary | ICD-10-CM

## 2014-04-03 DIAGNOSIS — L0291 Cutaneous abscess, unspecified: Secondary | ICD-10-CM

## 2014-04-03 DIAGNOSIS — C8590 Non-Hodgkin lymphoma, unspecified, unspecified site: Secondary | ICD-10-CM | POA: Diagnosis present

## 2014-04-03 DIAGNOSIS — Z5111 Encounter for antineoplastic chemotherapy: Secondary | ICD-10-CM | POA: Diagnosis present

## 2014-04-03 DIAGNOSIS — I427 Cardiomyopathy due to drug and external agent: Secondary | ICD-10-CM

## 2014-04-03 DIAGNOSIS — R110 Nausea: Secondary | ICD-10-CM

## 2014-04-03 DIAGNOSIS — D6481 Anemia due to antineoplastic chemotherapy: Secondary | ICD-10-CM | POA: Diagnosis not present

## 2014-04-03 DIAGNOSIS — E785 Hyperlipidemia, unspecified: Secondary | ICD-10-CM

## 2014-04-03 DIAGNOSIS — F329 Major depressive disorder, single episode, unspecified: Secondary | ICD-10-CM | POA: Diagnosis present

## 2014-04-03 DIAGNOSIS — G893 Neoplasm related pain (acute) (chronic): Secondary | ICD-10-CM

## 2014-04-03 DIAGNOSIS — J302 Other seasonal allergic rhinitis: Secondary | ICD-10-CM

## 2014-04-03 DIAGNOSIS — E079 Disorder of thyroid, unspecified: Secondary | ICD-10-CM

## 2014-04-03 DIAGNOSIS — Z5112 Encounter for antineoplastic immunotherapy: Secondary | ICD-10-CM | POA: Diagnosis not present

## 2014-04-03 DIAGNOSIS — L02214 Cutaneous abscess of groin: Secondary | ICD-10-CM

## 2014-04-03 DIAGNOSIS — IMO0001 Reserved for inherently not codable concepts without codable children: Secondary | ICD-10-CM

## 2014-04-03 LAB — COMPREHENSIVE METABOLIC PANEL
ALBUMIN: 3.7 g/dL (ref 3.5–5.2)
ALK PHOS: 70 U/L (ref 39–117)
ALT: 37 U/L (ref 0–53)
AST: 34 U/L (ref 0–37)
Anion gap: 9 (ref 5–15)
BILIRUBIN TOTAL: 0.4 mg/dL (ref 0.3–1.2)
BUN: 15 mg/dL (ref 6–23)
CHLORIDE: 102 mmol/L (ref 96–112)
CO2: 27 mmol/L (ref 19–32)
CREATININE: 1.06 mg/dL (ref 0.50–1.35)
Calcium: 9.4 mg/dL (ref 8.4–10.5)
GFR calc Af Amer: >90 mL/min (ref >90)
GFR, EST NON AFRICAN AMERICAN: 81 mL/min — AB (ref >90)
Glucose, Bld: 225 mg/dL — ABNORMAL HIGH (ref 70–99)
Potassium: 3.7 mmol/L (ref 3.5–5.1)
SODIUM: 138 mmol/L (ref 135–145)
Total Protein: 7.4 g/dL (ref 6.0–8.3)

## 2014-04-03 LAB — CBC WITH DIFFERENTIAL/PLATELET
Basophils Absolute: 0.2 10*3/uL — ABNORMAL HIGH (ref 0.0–0.1)
Basophils Relative: 2 % — ABNORMAL HIGH (ref 0–1)
Eosinophils Absolute: 0.1 10*3/uL (ref 0.0–0.7)
Eosinophils Relative: 1 % (ref 0–5)
HCT: 31.1 % — ABNORMAL LOW (ref 39.0–52.0)
Hemoglobin: 10.9 g/dL — ABNORMAL LOW (ref 13.0–17.0)
Lymphocytes Relative: 42 % (ref 12–46)
Lymphs Abs: 3.9 10*3/uL (ref 0.7–4.0)
MCH: 32.6 pg (ref 26.0–34.0)
MCHC: 35 g/dL (ref 30.0–36.0)
MCV: 93.1 fL (ref 78.0–100.0)
Monocytes Absolute: 0.9 10*3/uL (ref 0.1–1.0)
Monocytes Relative: 9 % (ref 3–12)
Neutro Abs: 4.3 10*3/uL (ref 1.7–7.7)
Neutrophils Relative %: 46 % (ref 43–77)
PLATELETS: 329 10*3/uL (ref 150–400)
RBC: 3.34 MIL/uL — ABNORMAL LOW (ref 4.22–5.81)
RDW: 14.1 % (ref 11.5–15.5)
WBC: 9.2 10*3/uL (ref 4.0–10.5)

## 2014-04-03 LAB — URINALYSIS, ROUTINE W REFLEX MICROSCOPIC
Bilirubin Urine: NEGATIVE
Glucose, UA: 100 mg/dL — AB
KETONES UR: NEGATIVE mg/dL
LEUKOCYTES UA: NEGATIVE
Nitrite: NEGATIVE
Protein, ur: NEGATIVE mg/dL
Specific Gravity, Urine: 1.023 (ref 1.005–1.030)
Urobilinogen, UA: 0.2 mg/dL (ref 0.0–1.0)
pH: 5 (ref 5.0–8.0)

## 2014-04-03 LAB — URINE MICROSCOPIC-ADD ON

## 2014-04-03 MED ORDER — HEPARIN SOD (PORK) LOCK FLUSH 100 UNIT/ML IV SOLN: 500.0000 [IU] | Freq: Once | INTRAVENOUS | Status: AC | PRN

## 2014-04-03 MED ORDER — SERTRALINE HCL 100 MG PO TABS
100.0000 mg | ORAL_TABLET | Freq: Every day | ORAL | Status: DC
  Administered 2014-04-03 – 2014-04-06 (×4): 100 mg via ORAL
  Filled 2014-04-03 (×4): qty 1

## 2014-04-03 MED ORDER — METFORMIN HCL 500 MG PO TABS
1000.0000 mg | ORAL_TABLET | Freq: Two times a day (BID) | ORAL | Status: DC
  Administered 2014-04-03 – 2014-04-06 (×6): 1000 mg via ORAL
  Filled 2014-04-03 (×8): qty 2

## 2014-04-03 MED ORDER — CEPHALEXIN 500 MG PO CAPS
500.0000 mg | ORAL_CAPSULE | Freq: Four times a day (QID) | ORAL | Status: DC
Start: 2014-04-03 — End: 2014-04-03

## 2014-04-03 MED ORDER — LEVOTHYROXINE SODIUM 200 MCG PO TABS
200.0000 ug | ORAL_TABLET | Freq: Every day | ORAL | Status: DC
  Administered 2014-04-04 – 2014-04-06 (×3): 200 ug via ORAL
  Filled 2014-04-03 (×4): qty 1

## 2014-04-03 MED ORDER — CARVEDILOL 6.25 MG PO TABS
6.2500 mg | ORAL_TABLET | Freq: Two times a day (BID) | ORAL | Status: DC
Start: 2014-04-03 — End: 2014-04-06
  Administered 2014-04-03 – 2014-04-06 (×6): 6.25 mg via ORAL
  Filled 2014-04-03 (×8): qty 1

## 2014-04-03 MED ORDER — SODIUM CHLORIDE 0.9 % IJ SOLN: 10.0000 mL | INTRAMUSCULAR | Status: DC | PRN

## 2014-04-03 MED ORDER — FENOFIBRATE 54 MG PO TABS
54.0000 mg | ORAL_TABLET | Freq: Every day | ORAL | Status: AC
  Administered 2014-04-03: 54 mg via ORAL
  Filled 2014-04-03: qty 1

## 2014-04-03 MED ORDER — PROCHLORPERAZINE MALEATE 10 MG PO TABS: 10.0000 mg | ORAL_TABLET | Freq: Four times a day (QID) | ORAL | Status: DC | PRN

## 2014-04-03 MED ORDER — HEPARIN SOD (PORK) LOCK FLUSH 100 UNIT/ML IV SOLN: 250.0000 [IU] | Freq: Once | INTRAVENOUS | Status: AC | PRN

## 2014-04-03 MED ORDER — AZELASTINE HCL 0.1 % NA SOLN
1.0000 | Freq: Two times a day (BID) | NASAL | Status: DC
  Administered 2014-04-03 – 2014-04-06 (×6): 1 via NASAL
  Filled 2014-04-03: qty 30

## 2014-04-03 MED ORDER — LISINOPRIL 10 MG PO TABS
10.0000 mg | ORAL_TABLET | Freq: Every day | ORAL | Status: DC
  Administered 2014-04-03 – 2014-04-06 (×4): 10 mg via ORAL
  Filled 2014-04-03 (×4): qty 1

## 2014-04-03 MED ORDER — MONTELUKAST SODIUM 10 MG PO TABS
10.0000 mg | ORAL_TABLET | Freq: Every day | ORAL | Status: DC
Start: 2014-04-03 — End: 2014-04-06
  Administered 2014-04-03 – 2014-04-05 (×3): 10 mg via ORAL
  Filled 2014-04-03 (×5): qty 1

## 2014-04-03 MED ORDER — ENOXAPARIN SODIUM 40 MG/0.4ML ~~LOC~~ SOLN
40.0000 mg | SUBCUTANEOUS | Status: DC
  Administered 2014-04-03 – 2014-04-05 (×3): 40 mg via SUBCUTANEOUS
  Filled 2014-04-03 (×4): qty 0.4

## 2014-04-03 MED ORDER — SODIUM CHLORIDE 0.9 % IV SOLN
INTRAVENOUS | Status: DC
  Administered 2014-04-03 – 2014-04-05 (×2): via INTRAVENOUS

## 2014-04-03 MED ORDER — AZELASTINE-FLUTICASONE 137-50 MCG/ACT NA SUSP: 1.0000 | Freq: Two times a day (BID) | NASAL | Status: DC

## 2014-04-03 MED ORDER — OXYCODONE HCL 5 MG PO TABS: 5.0000 mg | ORAL_TABLET | Freq: Four times a day (QID) | ORAL | Status: DC | PRN

## 2014-04-03 MED ORDER — SODIUM CHLORIDE 0.9 % IJ SOLN: 3.0000 mL | INTRAMUSCULAR | Status: DC | PRN

## 2014-04-03 MED ORDER — PRAVASTATIN SODIUM 40 MG PO TABS
40.0000 mg | ORAL_TABLET | Freq: Every day | ORAL | Status: DC
  Administered 2014-04-03 – 2014-04-05 (×3): 40 mg via ORAL
  Filled 2014-04-03 (×4): qty 1

## 2014-04-03 MED ORDER — FLUTICASONE PROPIONATE 50 MCG/ACT NA SUSP
1.0000 | Freq: Two times a day (BID) | NASAL | Status: DC
  Administered 2014-04-03 – 2014-04-06 (×6): 1 via NASAL
  Filled 2014-04-03: qty 16

## 2014-04-03 MED ORDER — SODIUM CHLORIDE 0.9 % IV SOLN
INTRAVENOUS | Status: AC
  Administered 2014-04-03: 16 mg via INTRAVENOUS
  Administered 2014-04-04 – 2014-04-05 (×2): 26 mg via INTRAVENOUS
  Filled 2014-04-03 (×3): qty 8

## 2014-04-03 MED ORDER — ALTEPLASE 2 MG IJ SOLR
2.0000 mg | Freq: Once | INTRAMUSCULAR | Status: AC | PRN
  Filled 2014-04-03: qty 2

## 2014-04-03 MED ORDER — POTASSIUM CHLORIDE CRYS ER 10 MEQ PO TBCR
10.0000 meq | EXTENDED_RELEASE_TABLET | Freq: Every day | ORAL | Status: DC
  Administered 2014-04-03 – 2014-04-06 (×4): 10 meq via ORAL
  Filled 2014-04-03 (×4): qty 1

## 2014-04-03 MED ORDER — SODIUM CHLORIDE 0.9 % IV SOLN
100.0000 mg/m2 | INTRAVENOUS | Status: AC
  Administered 2014-04-03 – 2014-04-05 (×3): 230 mg via INTRAVENOUS
  Filled 2014-04-03 (×3): qty 11.5

## 2014-04-03 MED ORDER — ALLOPURINOL 100 MG PO TABS: 100.0000 mg | ORAL_TABLET | Freq: Two times a day (BID) | ORAL | Status: DC

## 2014-04-03 MED ORDER — ALLOPURINOL 300 MG PO TABS
300.0000 mg | ORAL_TABLET | Freq: Every day | ORAL | Status: DC
  Administered 2014-04-03 – 2014-04-06 (×4): 300 mg via ORAL
  Filled 2014-04-03 (×4): qty 1

## 2014-04-03 MED ORDER — LOVASTATIN ER 40 MG PO TB24: 40.0000 mg | ORAL_TABLET | Freq: Every day | ORAL | Status: DC

## 2014-04-03 MED ORDER — HOT PACK MISC ONCOLOGY
1.0000 | Freq: Once | Status: AC | PRN
  Filled 2014-04-03: qty 1

## 2014-04-03 MED ORDER — ZOLPIDEM TARTRATE 5 MG PO TABS
5.0000 mg | ORAL_TABLET | Freq: Every evening | ORAL | Status: DC | PRN
  Administered 2014-04-04 – 2014-04-05 (×2): 5 mg via ORAL
  Filled 2014-04-03 (×2): qty 1

## 2014-04-03 NOTE — H&P (Signed)
[place]  Telephone:(336) [phone]    ADMISSION NOTE  Admitting MD: , MD  Attending MD: , MD   HPI: [name] [name] is an 49 y.o. male with a history of stage III Hodgkin's lymphoma admitted today for cycle 2 of  ICE on an inpatient basis. The patient will receive Neulasta injection on 04/07/2014. Denies fevers, chills, night sweats, vision changes, or mucositis. Denies any respiratory complaints. Denies any chest pain or palpitations. Denies lower extremity swelling. Denies nausea, heartburn or change in bowel habits. Denies abdominal pain. Last bowel movement on   Appetite is normal. Denies any dysuria. Denies abnormal skin rashes, or neuropathy. Denies any bleeding issues such as epistaxis, hematemesis, hematuria or hematochezia. Ambulating without difficulty.   DIAGNOSIS: Recurrent Hodgkin's lymphoma initially diagnosed as Stage III classical Hodgkin's lymphoma, mixed cellularity subtype diagnosed in December 2014.  PRIOR THERAPY: Status post 6 cycles of systemic chemotherapy with ABVD complicated with pulmonary fibrosis, last dose was given 07/14/2013  CURRENT THERAPY: Salvage chemotherapy with ICE (ifosfamide/mesna, carboplatin and etoposide) first dose was given 03/06/2014. Status post one cycle  PMH:  Past Medical History  Diagnosis Date  . Hypertension   . Diabetes mellitus without complication     Pre-diabetes  . Depression   . Headache(784.0)   . Thyroid disease     Hx: of  . Cancer 2015    cervical lymphadenopathy  . Hodgkin lymphoma 2015  . Hepatic artery aneurysm 2015    Surgeries:  Past Surgical History  Procedure Laterality Date  . Tonsillectomy    . Mass excision Left 12/31/2012    Procedure: CERVICAL LYMPH NODE BIOPSY;  Surgeon: [name], MD;  Location: [place];  Service: General;  Laterality: Left;    Allergies: No Known Allergies  Medications:   Prior to Admission:  Prescriptions prior to admission  Medication Sig  Dispense Refill Last Dose  . allopurinol (ZYLOPRIM) 100 MG tablet Take 1 tablet (100 mg total) by mouth 2 (two) times daily. (Patient not taking: Reported on 03/22/2014) 60 tablet 3 Not Taking  . Azelastine-Fluticasone 137-50 MCG/ACT SUSP Place 1 spray into the nose 2 (two) times daily.   Taking  . carvedilol (COREG) 6.25 MG tablet TAKE ONE TABLET BY MOUTH TWICE DAILY 60 tablet 0 Taking  . cephALEXin (KEFLEX) 500 MG capsule Take 1 capsule (500 mg total) by mouth 4 (four) times daily. 40 capsule 0   . eszopiclone (LUNESTA) 2 MG TABS tablet Take 2 mg by mouth at bedtime as needed for sleep. Take immediately before bedtime   Taking  . fenofibrate (TRICOR) 145 MG tablet TAKE ONE TABLET BY MOUTH ONCE DAILY 30 tablet 1 Taking  . fexofenadine-pseudoephedrine (ALLEGRA-D 24) 180-240 MG per 24 hr tablet Take 1 tablet by mouth daily.   Taking  . levothyroxine (SYNTHROID, LEVOTHROID) 200 MCG tablet Take 200 mcg by mouth daily before breakfast.   Taking  . lisinopril (PRINIVIL,ZESTRIL) 10 MG tablet Take 10 mg by mouth daily with breakfast.    Taking  . lovastatin (ALTOPREV) 40 MG 24 hr tablet Take 40 mg by mouth at bedtime.   Taking  . metFORMIN (GLUCOPHAGE) 500 MG tablet Take 1,000 mg by mouth 2 (two) times daily.    Taking  . montelukast (SINGULAIR) 10 MG tablet Take 10 mg by mouth at bedtime.   Taking  . OVER THE COUNTER MEDICATION Take 2.5 mLs by mouth daily. Isotonix Antioxidant Powder   Taking  . oxyCODONE (OXY IR/ROXICODONE) 5 MG immediate release  tablet Take 1 tablet (5 mg total) by mouth every 6 (six) hours as needed for severe pain. 60 tablet 0 Taking  . potassium chloride (K-DUR,KLOR-CON) 10 MEQ tablet Take 10 mEq by mouth daily.    Taking  . prochlorperazine (COMPAZINE) 10 MG tablet Take 1 tablet (10 mg total) by mouth every 6 (six) hours as needed for nausea or vomiting. (Patient not taking: Reported on 03/22/2014) 30 tablet 1 Not Taking  . sertraline (ZOLOFT) 100 MG tablet Take 100 mg by mouth daily  with breakfast.    Taking    Scheduled Meds: Continuous Infusions: PRN Meds:.  Review of Systems:  Constitutional: Denies fevers, chills or abnormal night sweats Eyes: Denies blurriness of vision, double vision or watery eyes Ears, nose, mouth, throat, and face: Denies mucositis or sore throat Respiratory: Denies cough, dyspnea or wheezes Cardiovascular: Denies palpitation, chest discomfort or lower extremity swelling Gastrointestinal:  Denies nausea, heartburn or change in bowel habits. Denies abdominal pain Skin: He was treated for abscess in the right groin area with I and D and a course of antibiotics, resolved Lymphatics: Denies new lymphadenopathy or easy bruising Neurological:Denies numbness, tingling or new weaknesses Behavioral/Psych: Mood is stable, no new changes  All other systems were reviewed with the patient and are negative  Family History:  Family History  Problem Relation Age of Onset  . Aneurysm Mother 47    brain  . Obesity Mother   . Heart disease Mother   . AAA (abdominal aortic aneurysm) Mother     dx in her 84s  . Hodgkin's lymphoma Father     dx in his 38s, and again in his 37s  . Diabetes Father   . Cancer Brother     NOS dx in his 73s  . Bone cancer Maternal Uncle 40    don't know if primary or metastises    Social History:  reports that he quit smoking about 21 months ago. His smoking use included Cigarettes. He has a 31 pack-year smoking history. He has never used smokeless tobacco. He reports that he does not drink alcohol or use illicit drugs.  Physical Exam:   Filed Vitals:   04/03/14 0935  BP: 134/91  Pulse: 97  Temp: 97.3 F (36.3 C)  Resp: 18   Filed Weights   04/03/14 0935  Weight: 255 lb 8 oz (115.894 kg)    GENERAL:alert, no distress and comfortable SKIN: skin color, texture, turgor are normal, no rashes or significant lesions EYES: normal, conjunctiva are pink and non-injected, sclera clear OROPHARYNX:no exudate, no  erythema and lips, buccal mucosa, and tongue normal  NECK: Palpable small left cervical lymph node, firm. LYMPH:  no palpable lymphadenopathy in the cervical, axillary or inguinal LUNGS: clear to auscultation and percussion with normal breathing effort HEART: regular rate & rhythm and no murmurs and no lower extremity edema. Right port normal ABDOMEN: soft, non-tender and normal bowel sounds Musculoskeletal:no cyanosis of digits and no clubbing  PSYCH: alert & oriented x 3 with fluent speech NEURO: no focal motor/sensory deficits    LABS: CBC   Recent Labs Lab 03/31/14 0843  WBC 10.7*  HGB 11.8*  HCT 34.2*  PLT 363  MCV 92.4  MCH 31.9  MCHC 34.5  RDW 13.6  LYMPHSABS 4.4*  MONOABS 1.2*  EOSABS 0.0  BASOSABS 0.2*     Anemia panel:  No results for input(s): VITAMINB12, FOLATE, FERRITIN, TIBC, IRON, RETICCTPCT in the last 72 hours.  No results for input(s): TSH, T4TOTAL, T3FREE,  THYROIDAB in the last 72 hours.  Invalid input(s): FREET3      Component Value Date/Time   ESRSEDRATE 45* 05/06/2013 0936      CMP    Recent Labs Lab 03/31/14 0843  NA 135*  K 4.2  CO2 25  GLUCOSE 196*  BUN 13.4  CREATININE 1.1  CALCIUM 9.5  AST 34  ALT 42  ALKPHOS 72  BILITOT 0.33        Component Value Date/Time   BILITOT 0.33 03/31/2014 0843   BILITOT 0.8 03/09/2014 0435     Urinalysis No results found for: COLORURINE, APPEARANCEUR, LABSPEC, PHURINE, GLUCOSEU, HGBUR, BILIRUBINUR, KETONESUR, PROTEINUR, UROBILINOGEN, NITRITE, LEUKOCYTESUR   Imaging Studies: No results found.   Assessment and Plan: 49 y.o. male with  stage III Hodgkin's lymphoma  He is tatus post 6 cycles of systemic chemotherapy with ABVD with almost complete response. He is currently undergoing salvage chemotherapy with ICE status post 1 cycle and tolerated the first cycle fairly well. We will proceed with cycle #2 week on an inpatient basis.  The patient will receive Neulasta injection  on 04/07/2014. He would come back for follow-up visit in 3 weeks for evaluation after repeating CT scan of the neck, chest, abdomen and pelvis for restaging of his disease. The patient voices understanding of current disease status and treatment options and is in agreement with the current care plan. Will monitor  for tumor lysis.  Tumor Lysis Prophylaxis Patient is at risk of tumor lysis Last LDH on 3/18 was 27  with Uric Acid on  3/18 at 5.3 Potassium and Calcium are unremarkable. Will monitor.  Anemia in neoplastic disease This is likely due to recent chemotherapy No transfusion is indicated at this time Monitor counts closely Transfuse blood to maintain a Hb of 8 g or if the patient is acutely bleeding  Full Code    [facility] E 04/03/2014 10:33 AM   ADDENDUM: Hematology/Oncology Attending: The patient is seen and examined today. I agree with the above note. This is a very pleasant 49 years old white male with recurrent Hodgkin's lymphoma who is here today to start the second cycle of salvage chemotherapy with ICE. He tolerated the first cycle of his treatment fairly well with no significant adverse effects. He was recently treated for groin abscess with incision and drainage as well as a course of antibiotics.  Will start the first day of the second cycle as a scheduled. The patient will continue on IV hydration as well as allopurinol for tumor lysis. For DVT prophylaxis, the patient will be on subcutaneous Lovenox.

## 2014-04-03 NOTE — Telephone Encounter (Signed)
Pt and bed placement notified of admission.

## 2014-04-03 NOTE — Progress Notes (Signed)
RN spoke with MD, per MD ok to initiate chemo with baseline labs from 3/18 prior to labs from this am resulting.

## 2014-04-03 NOTE — Progress Notes (Signed)
BSA, Vepesid dosage and dilution verified with 2nd RN Nancy Marus.

## 2014-04-04 LAB — GLUCOSE, CAPILLARY: Glucose-Capillary: 175 mg/dL — ABNORMAL HIGH (ref 70–99)

## 2014-04-04 MED ORDER — SODIUM CHLORIDE 0.9 % IV SOLN
730.0000 mg | Freq: Once | INTRAVENOUS | Status: AC
  Administered 2014-04-04: 730 mg via INTRAVENOUS
  Filled 2014-04-04: qty 73

## 2014-04-04 MED ORDER — MESNA 100 MG/ML IV SOLN
Freq: Once | INTRAVENOUS | Status: AC
  Administered 2014-04-04: 15:00:00 via INTRAVENOUS
  Filled 2014-04-04: qty 228

## 2014-04-04 MED ORDER — LIP MEDEX EX OINT
TOPICAL_OINTMENT | CUTANEOUS | Status: AC
  Filled 2014-04-04: qty 7

## 2014-04-04 NOTE — Care Management Note (Signed)
CARE MANAGEMENT NOTE 04/04/2014  Patient:  [name] [name], [name] [name]   Account Number:  [id number]  Date Initiated:  04/04/2014  Documentation initiated by:  Marney Doctor  Subjective/Objective Assessment:   50 yo admitted with Lymphoma. history of stage III Hodgkin's lymphoma admitted today for cycle 2 of  ICE on an inpatient basis.     Action/Plan:   From home with spouse   Anticipated DC Date:  04/08/2014   Anticipated DC Plan:  [place]  CM consult      Choice offered to / List presented to:             Status of service:  In process, will continue to follow Medicare Important Message given?   (If response is "NO", the following Medicare IM given date fields will be blank) Date Medicare IM given:   Medicare IM given by:   Date Additional Medicare IM given:   Additional Medicare IM given by:    Discharge Disposition:    Per UR Regulation:  Reviewed for med. necessity/level of care/duration of stay  If discussed at [place] of Stay Meetings, dates discussed:    Comments:  04/04/14 Marney Doctor RN,BSN,NCM [phone] Chart reviewed and CM following for DC needs.

## 2014-04-04 NOTE — Progress Notes (Signed)
BSA, dose and dilution for Carboplatin, Ifos and Mesna verified with 2nd RN Lottie Dawson.

## 2014-04-04 NOTE — Progress Notes (Signed)
Patient's BSA and dose/ dilution of etoposide verified with 2nd RN, Wilkie Aye

## 2014-04-04 NOTE — Progress Notes (Signed)
[name] [name]   DOB:Jan 09, 1966   TD#:[id number]   CWC#:[id number]  Patient Care Team: Pcp Not In System as PCP - General Jacelyn Pi, MD as Consulting Physician (Endocrinology)  Subjective: tolerated day 1 chemo well. Denies fevers, chills, night sweats, vision changes, or mucositis. Denies any respiratory complaints. Denies any chest pain or palpitations. Denies lower extremity swelling. Denies nausea, heartburn or change in bowel habits. Last bowel movement on 3/21  Appetite is normal. Denies any dysuria. Denies any bleeding issues such as epistaxis, hematemesis, hematuria or hematochezia. Ambulating without difficulty.   Scheduled Meds: . allopurinol  300 mg Oral Daily  . azelastine  1 spray Each Nare BID   And  . fluticasone  1 spray Each Nare BID  . carvedilol  6.25 mg Oral BID WC  . enoxaparin (LOVENOX) injection  40 mg Subcutaneous Q24H  . etoposide  100 mg/m2 (Treatment Plan Actual) Intravenous Q24H  . levothyroxine  200 mcg Oral QAC breakfast  . lisinopril  10 mg Oral Daily  . metFORMIN  1,000 mg Oral BID AC  . montelukast  10 mg Oral QHS  . ondansetron (ZOFRAN) with dexamethasone (DECADRON) IV   Intravenous Q24H  . potassium chloride  10 mEq Oral Daily  . pravastatin  40 mg Oral q1800  . sertraline  100 mg Oral Daily   Continuous Infusions: . sodium chloride 20 mL/hr at 04/03/14 1056   PRN Meds:oxyCODONE, prochlorperazine, sodium chloride, sodium chloride, zolpidem   Objective:  Filed Vitals:   04/04/14 0509  BP: 110/73  Pulse: 81  Temp: 98.2 F (36.8 C)  Resp: 16      Intake/Output Summary (Last 24 hours) at 04/04/14 0825 Last data filed at 04/04/14 0510  Gross per 24 hour  Intake    240 ml  Output   1675 ml  Net  -1435 ml    ECOG PERFORMANCE STATUS:0  GENERAL:alert, no distress and comfortable SKIN: skin color, texture, turgor are normal, no rashes or significant lesions EYES: normal, conjunctiva are pink and non-injected, sclera  clear OROPHARYNX:no exudate, no erythema and lips, buccal mucosa, and tongue normal  NECK: supple, thyroid normal size, non-tender, without nodularity LYMPH:  no palpable lymphadenopathy in the cervical, axillary or inguinal LUNGS: clear to auscultation and percussion with normal breathing effort HEART: regular rate & rhythm and no murmurs and no lower extremity edema. Right port normal ABDOMEN: soft, non-tender and normal bowel sounds Musculoskeletal:no cyanosis of digits and no clubbing  PSYCH: alert & oriented x 3 with fluent speech NEURO: no focal motor/sensory deficits    CBG (last 3)   Recent Labs  04/04/14 0745  GLUCAP 175*     Labs:   Recent Labs Lab 03/31/14 0843 04/03/14 1315  WBC 10.7* 9.2  HGB 11.8* 10.9*  HCT 34.2* 31.1*  PLT 363 329  MCV 92.4 93.1  MCH 31.9 32.6  MCHC 34.5 35.0  RDW 13.6 14.1  LYMPHSABS 4.4* 3.9  MONOABS 1.2* 0.9  EOSABS 0.0 0.1  BASOSABS 0.2* 0.2*     Chemistries:    Recent Labs Lab 03/31/14 0843 04/03/14 1315  NA 135* 138  K 4.2 3.7  CL  --  102  CO2 25 27  GLUCOSE 196* 225*  BUN 13.4 15  CREATININE 1.1 1.06  CALCIUM 9.5 9.4  AST 34 34  ALT 42 37  ALKPHOS 72 70  BILITOT 0.33 0.4    GFR Estimated Creatinine Clearance: 101.4 mL/min (by C-G formula based on Cr of 1.06).  Liver Function  Tests:  Recent Labs Lab 03/31/14 0843 04/03/14 1315  AST 34 34  ALT 42 37  ALKPHOS 72 70  BILITOT 0.33 0.4  PROT 7.8 7.4  ALBUMIN 3.7 3.7    Urine Studies     Component Value Date/Time   COLORURINE YELLOW 04/03/2014 1509   APPEARANCEUR CLEAR 04/03/2014 1509   LABSPEC 1.023 04/03/2014 1509   PHURINE 5.0 04/03/2014 1509   GLUCOSEU 100* 04/03/2014 1509   HGBUR MODERATE* 04/03/2014 1509   BILIRUBINUR NEGATIVE 04/03/2014 1509   KETONESUR NEGATIVE 04/03/2014 1509   PROTEINUR NEGATIVE 04/03/2014 1509   UROBILINOGEN 0.2 04/03/2014 1509   NITRITE NEGATIVE 04/03/2014 1509   LEUKOCYTESUR NEGATIVE 04/03/2014 1509    CBG:  Recent Labs Lab 04/04/14 0745  GLUCAP 175*     Imaging Studies:  No results found.  Assessment/Plan: 49 y.o.  Stage III Hodgkin's lymphoma  He was admitted on 3/21 for cycle 2 of salvage chemotherapy with ICE He tolerated day 1 well without any side effects. Will proceed with day 2 The patient will receive Neulasta injection on 04/07/2014. He would come back for follow-up visit in 3 weeks for evaluation after repeating CT scan of the neck, chest, abdomen and pelvis for restaging of his disease. The patient voices understanding of current disease status and treatment options and is in agreement with the current care plan.  Tumor Lysis Prophylaxis Patient is at risk of tumor lysis Last LDH on 3/18 was 277 with Uric Acid on3/18 at 5.3 Potassium and Calcium are unremarkable. Will continue IV hydration and allopurinol for tumor lysis  Anemia in neoplastic disease This is likely due to recent chemotherapy No transfusion is indicated at this time Monitor counts closely Transfuse blood to maintain a Hb of 8 g or if the patient is acutely bleeding  DVT prophylaxis On Lovenox  Full Code  **Disclaimer: This note was dictated with voice recognition software. Similar sounding words can inadvertently be transcribed and this note may contain transcription errors which may not have been corrected upon publication of note.[name] Butters E, PA-C 04/04/2014  8:25 AM  ADDENDUM: Hematology/Oncology Attending: The patient is seen and examined. I agree with the above note. He is a very pleasant 49 years old white male with relapsed Hodgkin's disease and currently undergoing salvage chemotherapy with ICE. He is status post 1 cycle and currently undergoing day 2 of cycle #2. He tolerated the first day of his treatment fairly well with no significant adverse effects. We will continue his treatment as a scheduled today. white male with relapsed Hodgkin's disease and currently undergoing salvage chemotherapy with ICE. He is status post 1 cycle and currently undergoing day 2 of cycle #2. He tolerated the first day of his treatment fairly well with no significant adverse effects. We will continue his treatment as a scheduled today.

## 2014-04-05 DIAGNOSIS — Z5111 Encounter for antineoplastic chemotherapy: Secondary | ICD-10-CM

## 2014-04-05 DIAGNOSIS — C8190 Hodgkin lymphoma, unspecified, unspecified site: Secondary | ICD-10-CM

## 2014-04-05 LAB — GLUCOSE, CAPILLARY: Glucose-Capillary: 165 mg/dL — ABNORMAL HIGH (ref 70–99)

## 2014-04-05 NOTE — Progress Notes (Signed)
[name] [name]   DOB:[birth year]   OZ#:[id number]   QQV#:[id number]  Patient Care Team: Pcp Not In System as PCP - General Jacelyn Pi, MD as Consulting Physician (Endocrinology)  Subjective: tolerated day 2 chemo well. Denies fevers, chills, night sweats, vision changes, or mucositis. Denies any respiratory complaints. Denies any chest pain or palpitations. Denies lower extremity swelling. Denies nausea, heartburn or change in bowel habits. Last bowel movement on 3/22  Appetite is normal. Denies any dysuria. Denies any bleeding issues such as epistaxis, hematemesis, hematuria or hematochezia. Ambulating without difficulty.   Scheduled Meds: . allopurinol  300 mg Oral Daily  . azelastine  1 spray Each Nare BID   And  . fluticasone  1 spray Each Nare BID  . carvedilol  6.25 mg Oral BID WC  . enoxaparin (LOVENOX) injection  40 mg Subcutaneous Q24H  . etoposide  100 mg/m2 (Treatment Plan Actual) Intravenous Q24H  . ifosfamide/mesna (IFEX/MESNEX) CHEMO for Inpatient CI in 1000 ml   Intravenous Once  . levothyroxine  200 mcg Oral QAC breakfast  . lisinopril  10 mg Oral Daily  . metFORMIN  1,000 mg Oral BID AC  . montelukast  10 mg Oral QHS  . ondansetron (ZOFRAN) with dexamethasone (DECADRON) IV   Intravenous Q24H  . potassium chloride  10 mEq Oral Daily  . pravastatin  40 mg Oral q1800  . sertraline  100 mg Oral Daily   Continuous Infusions: . sodium chloride 20 mL/hr at 04/03/14 1056   PRN Meds:oxyCODONE, prochlorperazine, sodium chloride, sodium chloride, zolpidem   Objective:  Filed Vitals:   04/05/14 0516  BP: 108/75  Pulse: 66  Temp: 97.3 F (36.3 C)  Resp: 16      Intake/Output Summary (Last 24 hours) at 04/05/14 0805 Last data filed at 04/05/14 0600  Gross per 24 hour  Intake 1559.6 ml  Output    900 ml  Net  659.6 ml    ECOG PERFORMANCE STATUS:0  GENERAL:alert, no distress and comfortable SKIN: skin color, texture, turgor are normal, no rashes or significant  lesions EYES: normal, conjunctiva are pink and non-injected, sclera clear OROPHARYNX:no exudate, no erythema and lips, buccal mucosa, and tongue normal  NECK: supple, thyroid normal size, non-tender, without nodularity LYMPH:  no palpable lymphadenopathy in the cervical, axillary or inguinal LUNGS: clear to auscultation and percussion with normal breathing effort HEART: regular rate & rhythm and no murmurs and no lower extremity edema. Right port normal ABDOMEN: soft, non-tender and normal bowel sounds Musculoskeletal:no cyanosis of digits and no clubbing  PSYCH: alert & oriented x 3 with fluent speech NEURO: no focal motor/sensory deficits    CBG (last 3)   Recent Labs  04/04/14 0745  GLUCAP 175*     Labs:   Recent Labs Lab 03/31/14 0843 04/03/14 1315  WBC 10.7* 9.2  HGB 11.8* 10.9*  HCT 34.2* 31.1*  PLT 363 329  MCV 92.4 93.1  MCH 31.9 32.6  MCHC 34.5 35.0  RDW 13.6 14.1  LYMPHSABS 4.4* 3.9  MONOABS 1.2* 0.9  EOSABS 0.0 0.1  BASOSABS 0.2* 0.2*     Chemistries:    Recent Labs Lab 03/31/14 0843 04/03/14 1315  NA 135* 138  K 4.2 3.7  CL  --  102  CO2 25 27  GLUCOSE 196* 225*  BUN 13.4 15  CREATININE 1.1 1.06  CALCIUM 9.5 9.4  AST 34 34  ALT 42 37  ALKPHOS 72 70  BILITOT 0.33 0.4    GFR Estimated Creatinine Clearance:  101.4 mL/min (by C-G formula based on Cr of 1.06).  Liver Function Tests:  Recent Labs Lab 03/31/14 0843 04/03/14 1315  AST 34 34  ALT 42 37  ALKPHOS 72 70  BILITOT 0.33 0.4  PROT 7.8 7.4  ALBUMIN 3.7 3.7    Urine Studies     Component Value Date/Time   COLORURINE YELLOW 04/03/2014 1509   APPEARANCEUR CLEAR 04/03/2014 1509   LABSPEC 1.023 04/03/2014 1509   PHURINE 5.0 04/03/2014 1509   GLUCOSEU 100* 04/03/2014 1509   HGBUR MODERATE* 04/03/2014 1509   BILIRUBINUR NEGATIVE 04/03/2014 1509   KETONESUR NEGATIVE 04/03/2014 1509   PROTEINUR NEGATIVE 04/03/2014 1509   UROBILINOGEN 0.2 04/03/2014 1509   NITRITE  NEGATIVE 04/03/2014 1509   LEUKOCYTESUR NEGATIVE 04/03/2014 1509   CBG:  Recent Labs Lab 04/04/14 0745  GLUCAP 175*     Imaging Studies:  No results found.  Assessment/Plan: 49 y.o.  Stage III Hodgkin's lymphoma  He was admitted on 3/21 for cycle 2 of salvage chemotherapy with ICE He tolerated day 2 well without any side effects. Will proceed with day 3 The patient will receive Neulasta injection on 04/07/2014. He would come back for follow-up visit in 3 weeks for evaluation after repeating CT scan of the neck, chest, abdomen and pelvis for restaging of his disease.  Tumor Lysis Prophylaxis Patient is at risk of tumor lysis Last LDH on 3/18 was 277 with Uric Acid on3/18 at 5.3 Potassium and Calcium are unremarkable. Will continue IV hydration and allopurinol for tumor lysis  Anemia in neoplastic disease This is likely due to recent chemotherapy No transfusion is indicated at this time Monitor counts closely Transfuse blood to maintain a Hb of 8 g or if the patient is acutely bleeding  DVT prophylaxis On Lovenox  Full Code  **Disclaimer: This note was dictated with voice recognition software. Similar sounding words can inadvertently be transcribed and this note may contain transcription errors which may not have been corrected upon publication of note.Sharene Butters E, PA-C 04/05/2014  8:05 AM  ADDENDUM: Hematology/Oncology Attending: The patient is seen and examined. I agree with the above note. He is day +3 of the second cycle of chemotherapy with ICE for relapsed Hodgkin lymphoma. The patient is feeling fine today and tolerating his treatment fairly well with no significant adverse effects. We will proceed with the Saturday of his treatment as scheduled. The patient is expected to be discharged from the hospital tomorrow after completion of his chemotherapy. He will have Neulasta injection scheduled on 04/07/2014. The patient will continue on allopurinol for tumor  lysis prophylaxis.  He has a follow-up appointment with me after repeating CT scan of the neck, chest, abdomen and pelvis for restaging of his disease in around 3 weeks.

## 2014-04-06 DIAGNOSIS — D6481 Anemia due to antineoplastic chemotherapy: Secondary | ICD-10-CM

## 2014-04-06 DIAGNOSIS — Z5112 Encounter for antineoplastic immunotherapy: Secondary | ICD-10-CM

## 2014-04-06 DIAGNOSIS — C8128 Mixed cellularity classical Hodgkin lymphoma, lymph nodes of multiple sites: Secondary | ICD-10-CM

## 2014-04-06 LAB — GLUCOSE, CAPILLARY: GLUCOSE-CAPILLARY: 105 mg/dL — AB (ref 70–99)

## 2014-04-06 MED ORDER — HEPARIN SOD (PORK) LOCK FLUSH 100 UNIT/ML IV SOLN
500.0000 [IU] | Freq: Once | INTRAVENOUS | Status: AC
  Administered 2014-04-06: 500 [IU] via INTRAVENOUS
  Filled 2014-04-06: qty 5

## 2014-04-06 NOTE — Progress Notes (Signed)
Patient discharged to home, discharge instructions reviewed with patient who verbalized understanding. No new RX's for patient.

## 2014-04-06 NOTE — Discharge Summary (Signed)
Patient ID: [name] [name] MRN: [id number] DOB/AGE: 48/09/1965 48 y.o.  Admit date: 04/03/2014 Discharge date: 04/06/2014  Patient Care Team: Pcp Not In System as PCP - General Jacelyn Pi, MD as Consulting Physician (Endocrinology)  Brief History of Present Illness: For complete details please refer to admission H and P, but in brief, [name] [name] is an 49 y.o. male with a history of stage III Hodgkin's lymphoma admitted today for cycle 2 of ICE on an inpatient basis.  Discharge Diagnoses/Hospital Course:  Stage III Hodgkin's lymphoma  He was admitted on 3/21 for cycle 2 of salvage chemotherapy with ICE He tolerated cycle 2 well without any side effects.  The patient will receive Neulasta injection on 04/07/2014. He would come back for follow-up visit on 4/8 for evaluation after repeating CT scan of the neck, chest, abdomen and pelvis for restaging of his disease.  Tumor Lysis Prophylaxis Patient is at risk of tumor lysis Last LDH on 3/18 was 277 with Uric Acid on3/18 at 5.3 Potassium and Calcium are unremarkable. Will continue allopurinol as outpatient for tumor lysis  Anemia in neoplastic disease This is likely due to recent chemotherapy No transfusion is indicated at this time Monitor counts closely as outpatient  DVT prophylaxis He received Lovenox prophylaxis  Full Code  Procedures: S/P  Cycle 2 Salvage chemotherapy with ICE from 3/21-3/23  Consultations: None   Past Medical History  Diagnosis Date  . Hypertension   . Diabetes mellitus without complication     Pre-diabetes  . Depression   . Headache(784.0)   . Thyroid disease     Hx: of  . Cancer 2015    cervical lymphadenopathy  . Hodgkin lymphoma 2015  . Hepatic artery aneurysm 2015    Discharge Medications:    Medication List    TAKE these medications        allopurinol 100 MG tablet  Commonly known as:  ZYLOPRIM  Take 1 tablet (100 mg total) by mouth 2 (two) times daily.      Azelastine-Fluticasone 137-50 MCG/ACT Susp  Place 1 spray into the nose 2 (two) times daily.     carvedilol 6.25 MG tablet  Commonly known as:  COREG  TAKE ONE TABLET BY MOUTH TWICE DAILY     cephALEXin 500 MG capsule  Commonly known as:  KEFLEX  Take 1 capsule (500 mg total) by mouth 4 (four) times daily.     eszopiclone 2 MG Tabs tablet  Commonly known as:  LUNESTA  Take 2 mg by mouth at bedtime as needed for sleep. Take immediately before bedtime     fenofibrate 145 MG tablet  Commonly known as:  TRICOR  TAKE ONE TABLET BY MOUTH ONCE DAILY     fexofenadine-pseudoephedrine 180-240 MG per 24 hr tablet  Commonly known as:  ALLEGRA-D 24  Take 1 tablet by mouth daily.     levothyroxine 200 MCG tablet  Commonly known as:  SYNTHROID, LEVOTHROID  Take 200 mcg by mouth daily before breakfast.     lisinopril 10 MG tablet  Commonly known as:  PRINIVIL,ZESTRIL  Take 10 mg by mouth daily with breakfast.     lovastatin 40 MG 24 hr tablet  Commonly known as:  ALTOPREV  Take 40 mg by mouth at bedtime.     metFORMIN 500 MG tablet  Commonly known as:  GLUCOPHAGE  Take 1,000 mg by mouth 2 (two) times daily.     montelukast 10 MG tablet  Commonly known as:  SINGULAIR  Take 10 mg by mouth at bedtime.     OVER THE COUNTER MEDICATION  - Take 2.5 mLs by mouth daily. *Isotonix Antioxidant Powder*   - Mixes with water     oxyCODONE 5 MG immediate release tablet  Commonly known as:  Oxy IR/ROXICODONE  Take 1 tablet (5 mg total) by mouth every 6 (six) hours as needed for severe pain.     potassium chloride 10 MEQ tablet  Commonly known as:  K-DUR,KLOR-CON  Take 10 mEq by mouth daily.     prochlorperazine 10 MG tablet  Commonly known as:  COMPAZINE  Take 1 tablet (10 mg total) by mouth every 6 (six) hours as needed for nausea or vomiting.     sertraline 100 MG tablet  Commonly known as:  ZOLOFT  Take 100 mg by mouth daily with breakfast.        Discharge Condition:  Improved.  Diet recommendation:  Carbohydrate-modified.   Disposition and Follow-up:  The patient will receive Neulasta injection on 04/07/2014.                                                  Dr. Julien Nordmann on 4/8    ECOG PERFORMANCE STATUS: 0  Physical Exam at Discharge:  Subjective:  Tolerated cycle 2 chemo well. Denies fevers, chills, night sweats, vision changes, or mucositis. Denies any respiratory complaints. Denies any chest pain or palpitations. Denies lower extremity swelling. Denies nausea, heartburn or change in bowel habits. Last bowel movement on 3/23 Appetite is normal. Denies any dysuria. Denies any bleeding issues such as epistaxis, hematemesis, hematuria or hematochezia. Ambulating without difficulty.  Objective:  BP 104/62 mmHg  Pulse 54  Temp(Src) 97.5 F (36.4 C) (Oral)  Resp 16  Ht 5\' 10"  (1.778 m)  Wt 221 lb 14.4 oz (100.653 kg)  BMI 31.84 kg/m2  SpO2 100%  GENERAL:alert, no distress and comfortable SKIN: skin color, texture, turgor are normal, no rashes or significant lesions EYES: normal, conjunctiva are pink and non-injected, sclera clear OROPHARYNX:no exudate, no erythema and lips, buccal mucosa, and tongue normal  NECK: supple, thyroid normal size, non-tender, without nodularity LYMPH:  no palpable lymphadenopathy in the cervical, axillary or inguinal area LUNGS: clear to auscultation and percussion with normal breathing effort HEART: regular rate & rhythm and no murmurs and no lower extremity edema ABDOMEN:abdomen soft, non-tender and normal bowel sounds Musculoskeletal:no cyanosis of digits and no clubbing  PSYCH: alert & oriented x 3 with fluent speech NEURO: no focal motor/sensory deficits    Significant Diagnostic Studies:  No results found.  Discharge Laboratory Values:  CBC  Recent Labs Lab 03/31/14 0843 04/03/14 1315  WBC 10.7* 9.2  HGB 11.8* 10.9*  HCT 34.2* 31.1*  PLT 363 329  MCV 92.4 93.1  MCH 31.9 32.6  MCHC 34.5 35.0   RDW 13.6 14.1  LYMPHSABS 4.4* 3.9  MONOABS 1.2* 0.9  EOSABS 0.0 0.1  BASOSABS 0.2* 0.2*    Anemia panel:  No results for input(s): VITAMINB12, FOLATE, FERRITIN, TIBC, IRON, RETICCTPCT in the last 72 hours.   Chemistries   Recent Labs Lab 03/31/14 0843 04/03/14 1315  NA 135* 138  K 4.2 3.7  CL  --  102  CO2 25 27  GLUCOSE 196* 225*  BUN 13.4 15  CREATININE 1.1 1.06  CALCIUM 9.5 9.4     Signed:  Sharene Butters, PA-C 04/06/2014, 7:40 AM  Mr. Mollett was interviewed and examined. No apparent acute toxicity from the ICE chemotherapy. He appears stable for discharge today. He will return to the [facility] 04/07/2014 for Neulasta.

## 2014-04-06 NOTE — Discharge Instructions (Signed)
Call for a fever or spontaneous bruising/bleeding

## 2014-04-07 ENCOUNTER — Ambulatory Visit (HOSPITAL_BASED_OUTPATIENT_CLINIC_OR_DEPARTMENT_OTHER): Payer: Commercial Managed Care - PPO

## 2014-04-07 DIAGNOSIS — C8128 Mixed cellularity classical Hodgkin lymphoma, lymph nodes of multiple sites: Secondary | ICD-10-CM | POA: Diagnosis not present

## 2014-04-07 DIAGNOSIS — Z5189 Encounter for other specified aftercare: Secondary | ICD-10-CM

## 2014-04-07 DIAGNOSIS — C8120 Mixed cellularity classical Hodgkin lymphoma, unspecified site: Secondary | ICD-10-CM

## 2014-04-07 MED ORDER — PEGFILGRASTIM INJECTION 6 MG/0.6ML ~~LOC~~
6.0000 mg | PREFILLED_SYRINGE | Freq: Once | SUBCUTANEOUS | Status: AC
  Administered 2014-04-07: 6 mg via SUBCUTANEOUS
  Filled 2014-04-07: qty 0.6

## 2014-04-10 ENCOUNTER — Other Ambulatory Visit (HOSPITAL_BASED_OUTPATIENT_CLINIC_OR_DEPARTMENT_OTHER): Payer: Commercial Managed Care - PPO

## 2014-04-10 DIAGNOSIS — C8128 Mixed cellularity classical Hodgkin lymphoma, lymph nodes of multiple sites: Secondary | ICD-10-CM | POA: Diagnosis not present

## 2014-04-10 DIAGNOSIS — C8190 Hodgkin lymphoma, unspecified, unspecified site: Secondary | ICD-10-CM

## 2014-04-10 LAB — CBC WITH DIFFERENTIAL/PLATELET
BASO%: 0.4 % (ref 0.0–2.0)
Basophils Absolute: 0 10*3/uL (ref 0.0–0.1)
EOS%: 2 % (ref 0.0–7.0)
Eosinophils Absolute: 0.2 10*3/uL (ref 0.0–0.5)
HCT: 34.1 % — ABNORMAL LOW (ref 38.4–49.9)
HEMOGLOBIN: 12 g/dL — AB (ref 13.0–17.1)
LYMPH%: 18.3 % (ref 14.0–49.0)
MCH: 32.3 pg (ref 27.2–33.4)
MCHC: 35.2 g/dL (ref 32.0–36.0)
MCV: 91.7 fL (ref 79.3–98.0)
MONO#: 0.2 10*3/uL (ref 0.1–0.9)
MONO%: 1.6 % (ref 0.0–14.0)
NEUT#: 7.8 10*3/uL — ABNORMAL HIGH (ref 1.5–6.5)
NEUT%: 77.7 % — ABNORMAL HIGH (ref 39.0–75.0)
PLATELETS: 271 10*3/uL (ref 140–400)
RBC: 3.72 10*6/uL — ABNORMAL LOW (ref 4.20–5.82)
RDW: 13.8 % (ref 11.0–14.6)
WBC: 10 10*3/uL (ref 4.0–10.3)
lymph#: 1.8 10*3/uL (ref 0.9–3.3)

## 2014-04-10 LAB — COMPREHENSIVE METABOLIC PANEL (CC13)
ALK PHOS: 106 U/L (ref 40–150)
ALT: 47 U/L (ref 0–55)
AST: 30 U/L (ref 5–34)
Albumin: 3.8 g/dL (ref 3.5–5.0)
Anion Gap: 11 mEq/L (ref 3–11)
BUN: 16.4 mg/dL (ref 7.0–26.0)
CO2: 22 mEq/L (ref 22–29)
CREATININE: 1.2 mg/dL (ref 0.7–1.3)
Calcium: 9.3 mg/dL (ref 8.4–10.4)
Chloride: 101 mEq/L (ref 98–109)
EGFR: 73 mL/min/{1.73_m2} — ABNORMAL LOW (ref >90)
Glucose: 271 mg/dl — ABNORMAL HIGH (ref 70–140)
Potassium: 4.4 mEq/L (ref 3.5–5.1)
Sodium: 134 mEq/L — ABNORMAL LOW (ref 136–145)
Total Bilirubin: 0.6 mg/dL (ref 0.20–1.20)
Total Protein: 7.9 g/dL (ref 6.4–8.3)

## 2014-04-10 LAB — URIC ACID (CC13): Uric Acid, Serum: 3.7 mg/dl (ref 2.6–7.4)

## 2014-04-14 ENCOUNTER — Telehealth: Payer: Self-pay | Admitting: Medical Oncology

## 2014-04-14 ENCOUNTER — Telehealth (HOSPITAL_COMMUNITY): Payer: Self-pay | Admitting: Radiology

## 2014-04-14 ENCOUNTER — Telehealth: Payer: Self-pay

## 2014-04-14 ENCOUNTER — Encounter (HOSPITAL_COMMUNITY): Payer: Self-pay | Admitting: Radiology

## 2014-04-14 NOTE — Telephone Encounter (Signed)
Letter not sent, pt scheduled scans today for next week.

## 2014-04-14 NOTE — Telephone Encounter (Signed)
Levada Dy from [facility] called stating she has left messages for pt to schedule CT. She has been trying since 3/18 with no call backs. She will try amber blount ([place]) or sister Madaline Savage.

## 2014-04-14 NOTE — Telephone Encounter (Signed)
Pt spoke to radiology and scan scheduled.

## 2014-04-14 NOTE — Telephone Encounter (Signed)
Scan scheduled and pt confirmed.

## 2014-04-14 NOTE — Telephone Encounter (Signed)
We have tried multiple times to schedule rad exams for this pt.  We have left messages with no results.  I am sending him a letter today to call us to schedule his exams.

## 2014-04-17 ENCOUNTER — Other Ambulatory Visit (HOSPITAL_BASED_OUTPATIENT_CLINIC_OR_DEPARTMENT_OTHER): Payer: Commercial Managed Care - PPO

## 2014-04-17 DIAGNOSIS — C8121 Mixed cellularity classical Hodgkin lymphoma, lymph nodes of head, face, and neck: Secondary | ICD-10-CM | POA: Diagnosis not present

## 2014-04-17 DIAGNOSIS — C8120 Mixed cellularity classical Hodgkin lymphoma, unspecified site: Secondary | ICD-10-CM

## 2014-04-17 LAB — COMPREHENSIVE METABOLIC PANEL (CC13)
ALT: 54 U/L (ref 0–55)
AST: 35 U/L — ABNORMAL HIGH (ref 5–34)
Albumin: 3.8 g/dL (ref 3.5–5.0)
Alkaline Phosphatase: 129 U/L (ref 40–150)
Anion Gap: 12 mEq/L — ABNORMAL HIGH (ref 3–11)
BUN: 10.5 mg/dL (ref 7.0–26.0)
CO2: 25 mEq/L (ref 22–29)
Calcium: 9.5 mg/dL (ref 8.4–10.4)
Chloride: 102 mEq/L (ref 98–109)
Creatinine: 1.1 mg/dL (ref 0.7–1.3)
EGFR: 79 mL/min/{1.73_m2} — ABNORMAL LOW (ref >90)
Glucose: 259 mg/dl — ABNORMAL HIGH (ref 70–140)
Potassium: 4.6 mEq/L (ref 3.5–5.1)
Sodium: 139 mEq/L (ref 136–145)
Total Bilirubin: 0.37 mg/dL (ref 0.20–1.20)
Total Protein: 7.6 g/dL (ref 6.4–8.3)

## 2014-04-17 LAB — CBC WITH DIFFERENTIAL/PLATELET
BASO%: 0.7 % (ref 0.0–2.0)
Basophils Absolute: 0.2 10*3/uL — ABNORMAL HIGH (ref 0.0–0.1)
EOS ABS: 0.1 10*3/uL (ref 0.0–0.5)
EOS%: 0.2 % (ref 0.0–7.0)
HCT: 33.3 % — ABNORMAL LOW (ref 38.4–49.9)
HGB: 11.1 g/dL — ABNORMAL LOW (ref 13.0–17.1)
LYMPH%: 13.7 % — ABNORMAL LOW (ref 14.0–49.0)
MCH: 31.2 pg (ref 27.2–33.4)
MCHC: 33.3 g/dL (ref 32.0–36.0)
MCV: 93.7 fL (ref 79.3–98.0)
MONO#: 1.5 10*3/uL — ABNORMAL HIGH (ref 0.1–0.9)
MONO%: 4.6 % (ref 0.0–14.0)
NEUT%: 80.8 % — ABNORMAL HIGH (ref 39.0–75.0)
NEUTROS ABS: 25.8 10*3/uL — AB (ref 1.5–6.5)
Platelets: 86 10*3/uL — ABNORMAL LOW (ref 140–400)
RBC: 3.55 10*6/uL — ABNORMAL LOW (ref 4.20–5.82)
RDW: 14.3 % (ref 11.0–14.6)
WBC: 31.9 10*3/uL — AB (ref 4.0–10.3)
lymph#: 4.4 10*3/uL — ABNORMAL HIGH (ref 0.9–3.3)

## 2014-04-17 LAB — LACTATE DEHYDROGENASE (CC13): LDH: 499 U/L — ABNORMAL HIGH (ref 125–245)

## 2014-04-20 ENCOUNTER — Ambulatory Visit (HOSPITAL_COMMUNITY)
Admission: RE | Admit: 2014-04-20 | Discharge: 2014-04-20 | Disposition: A | Discharge status: Home / Self Care | Payer: Commercial Managed Care - PPO | Source: Ambulatory Visit | Attending: Internal Medicine | Admitting: Internal Medicine

## 2014-04-20 ENCOUNTER — Encounter (HOSPITAL_COMMUNITY): Payer: Self-pay

## 2014-04-20 DIAGNOSIS — I2510 Atherosclerotic heart disease of native coronary artery without angina pectoris: Secondary | ICD-10-CM | POA: Diagnosis not present

## 2014-04-20 DIAGNOSIS — C8120 Mixed cellularity classical Hodgkin lymphoma, unspecified site: Secondary | ICD-10-CM | POA: Insufficient documentation

## 2014-04-20 DIAGNOSIS — K760 Fatty (change of) liver, not elsewhere classified: Secondary | ICD-10-CM | POA: Diagnosis not present

## 2014-04-20 DIAGNOSIS — I728 Aneurysm of other specified arteries: Secondary | ICD-10-CM | POA: Diagnosis not present

## 2014-04-20 MED ORDER — IOHEXOL 300 MG/ML  SOLN
100.0000 mL | Freq: Once | INTRAMUSCULAR | Status: AC | PRN
  Administered 2014-04-20: 100 mL via INTRAVENOUS

## 2014-04-21 ENCOUNTER — Other Ambulatory Visit (HOSPITAL_BASED_OUTPATIENT_CLINIC_OR_DEPARTMENT_OTHER): Payer: Commercial Managed Care - PPO

## 2014-04-21 ENCOUNTER — Encounter: Payer: Self-pay | Admitting: Internal Medicine

## 2014-04-21 ENCOUNTER — Telehealth: Payer: Self-pay | Admitting: *Deleted

## 2014-04-21 ENCOUNTER — Telehealth: Payer: Self-pay | Admitting: Internal Medicine

## 2014-04-21 ENCOUNTER — Ambulatory Visit (HOSPITAL_BASED_OUTPATIENT_CLINIC_OR_DEPARTMENT_OTHER): Payer: Commercial Managed Care - PPO | Admitting: Internal Medicine

## 2014-04-21 VITALS — BP 123/85 | HR 93 | Temp 98.0°F | Resp 19 | Ht 70.0 in | Wt 225.4 lb

## 2014-04-21 DIAGNOSIS — C8120 Mixed cellularity classical Hodgkin lymphoma, unspecified site: Secondary | ICD-10-CM | POA: Diagnosis not present

## 2014-04-21 DIAGNOSIS — C8190 Hodgkin lymphoma, unspecified, unspecified site: Secondary | ICD-10-CM

## 2014-04-21 LAB — COMPREHENSIVE METABOLIC PANEL (CC13)
ALBUMIN: 3.8 g/dL (ref 3.5–5.0)
ALK PHOS: 125 U/L (ref 40–150)
ALT: 50 U/L (ref 0–55)
AST: 33 U/L (ref 5–34)
Anion Gap: 11 mEq/L (ref 3–11)
BUN: 11.9 mg/dL (ref 7.0–26.0)
CHLORIDE: 99 meq/L (ref 98–109)
CO2: 26 meq/L (ref 22–29)
Calcium: 9.2 mg/dL (ref 8.4–10.4)
Creatinine: 1.2 mg/dL (ref 0.7–1.3)
EGFR: 72 mL/min/{1.73_m2} — ABNORMAL LOW (ref >90)
Glucose: 297 mg/dl — ABNORMAL HIGH (ref 70–140)
POTASSIUM: 4.8 meq/L (ref 3.5–5.1)
Sodium: 137 mEq/L (ref 136–145)
Total Bilirubin: 0.46 mg/dL (ref 0.20–1.20)
Total Protein: 7.7 g/dL (ref 6.4–8.3)

## 2014-04-21 LAB — CBC WITH DIFFERENTIAL/PLATELET
BASO%: 0.6 % (ref 0.0–2.0)
BASOS ABS: 0.1 10*3/uL (ref 0.0–0.1)
EOS%: 0.2 % (ref 0.0–7.0)
Eosinophils Absolute: 0 10*3/uL (ref 0.0–0.5)
HCT: 30.7 % — ABNORMAL LOW (ref 38.4–49.9)
HEMOGLOBIN: 10.6 g/dL — AB (ref 13.0–17.1)
LYMPH%: 17.5 % (ref 14.0–49.0)
MCH: 32 pg (ref 27.2–33.4)
MCHC: 34.5 g/dL (ref 32.0–36.0)
MCV: 92.7 fL (ref 79.3–98.0)
MONO#: 1.4 10*3/uL — AB (ref 0.1–0.9)
MONO%: 8.8 % (ref 0.0–14.0)
NEUT%: 72.9 % (ref 39.0–75.0)
NEUTROS ABS: 11.7 10*3/uL — AB (ref 1.5–6.5)
Platelets: 239 10*3/uL (ref 140–400)
RBC: 3.31 10*6/uL — AB (ref 4.20–5.82)
RDW: 14.8 % — AB (ref 11.0–14.6)
WBC: 16.1 10*3/uL — ABNORMAL HIGH (ref 4.0–10.3)
lymph#: 2.8 10*3/uL (ref 0.9–3.3)
nRBC: 2 % — ABNORMAL HIGH (ref 0–0)

## 2014-04-21 NOTE — Progress Notes (Signed)
[facility] [phone]   [phone]  OFFICE PROGRESS NOTE  Pcp Not In System No address on file  DIAGNOSIS: Recurrent Hodgkin's lymphoma initially diagnosed as Stage III classical Hodgkin's lymphoma, mixed cellularity subtype diagnosed in December 2014.  PRIOR THERAPY: Status post 6 cycles of systemic chemotherapy with ABVD complicated with pulmonary fibrosis, last dose was given 07/14/2013  CURRENT THERAPY: Salvage chemotherapy with ICE (ifosfamide/mesna, carboplatin and etoposide) first dose was given 03/06/2014. Status post 2 cycles.  INTERVAL HISTORY: [name] [name] 49 y.o. male returns to the clinic today for follow-up visit.  The patient is feeling fine today was no specific complaints. He tolerated the second cycle of his systemic chemotherapy fairly well with no significant adverse effects. He was treated again for abscess in the lower extremities with a course of antibiotics. These abscesses are dried at this point. He denied having any significant weight loss or night sweats. The patient denied having any chest pain, shortness of breath, cough or hemoptysis. He has no nausea or vomiting. He had repeat CT scan of the neck, chest, abdomen and pelvis performed recently and he is here for evaluation and discussion of his scan results.  MEDICAL HISTORY: Past Medical History  Diagnosis Date  . Hypertension   . Diabetes mellitus without complication     Pre-diabetes  . Depression   . Headache(784.0)   . Thyroid disease     Hx: of  . Cancer 2015    cervical lymphadenopathy  . Hodgkin lymphoma 2015  . Hepatic artery aneurysm 2015    ALLERGIES:  has No Known Allergies.  MEDICATIONS:  Current Outpatient Prescriptions  Medication Sig Dispense Refill  . allopurinol (ZYLOPRIM) 100 MG tablet Take 1 tablet (100 mg total) by mouth 2 (two) times daily. 60 tablet 3  . Azelastine-Fluticasone 137-50 MCG/ACT SUSP Place 1 spray into the nose 2  (two) times daily.    . carvedilol (COREG) 6.25 MG tablet TAKE ONE TABLET BY MOUTH TWICE DAILY 60 tablet 0  . eszopiclone (LUNESTA) 2 MG TABS tablet Take 2 mg by mouth at bedtime as needed for sleep. Take immediately before bedtime    . fenofibrate (TRICOR) 145 MG tablet TAKE ONE TABLET BY MOUTH ONCE DAILY 30 tablet 1  . fexofenadine-pseudoephedrine (ALLEGRA-D 24) 180-240 MG per 24 hr tablet Take 1 tablet by mouth daily.    Marland Kitchen levothyroxine (SYNTHROID, LEVOTHROID) 200 MCG tablet Take 200 mcg by mouth daily before breakfast.    . lisinopril (PRINIVIL,ZESTRIL) 10 MG tablet Take 10 mg by mouth daily with breakfast.     . lovastatin (ALTOPREV) 40 MG 24 hr tablet Take 40 mg by mouth at bedtime.    . metFORMIN (GLUCOPHAGE) 500 MG tablet Take 1,000 mg by mouth 2 (two) times daily.     . montelukast (SINGULAIR) 10 MG tablet Take 10 mg by mouth at bedtime.    Marland Kitchen OVER THE COUNTER MEDICATION Take 2.5 mLs by mouth daily. *Isotonix Antioxidant Powder*  Mixes with water    . oxyCODONE (OXY IR/ROXICODONE) 5 MG immediate release tablet Take 1 tablet (5 mg total) by mouth every 6 (six) hours as needed for severe pain. 60 tablet 0  . potassium chloride (K-DUR,KLOR-CON) 10 MEQ tablet Take 10 mEq by mouth daily.     . prochlorperazine (COMPAZINE) 10 MG tablet Take 1 tablet (10 mg total) by mouth every 6 (six) hours as needed for nausea or vomiting. (Patient taking differently: Take 10 mg by mouth  daily as needed for nausea or vomiting. ) 30 tablet 1  . sertraline (ZOLOFT) 100 MG tablet Take 100 mg by mouth daily with breakfast.      No current facility-administered medications for this visit.    SURGICAL HISTORY:  Past Surgical History  Procedure Laterality Date  . Tonsillectomy    . Mass excision Left 12/31/2012    Procedure: CERVICAL LYMPH NODE BIOPSY;  Surgeon: [name], MD;  Location: [facility];  Service: General;  Laterality: Left;    REVIEW OF SYSTEMS:  Constitutional: negative Eyes: negative Ears,  nose, mouth, throat, and face: negative Respiratory: negative Cardiovascular: negative Gastrointestinal: negative Genitourinary:negative Integument/breast: negative Hematologic/lymphatic: negative Musculoskeletal:negative Neurological: negative Behavioral/Psych: negative Endocrine: negative Allergic/Immunologic: negative   PHYSICAL EXAMINATION: General appearance: alert, cooperative and no distress Head: Normocephalic, without obvious abnormality, atraumatic Neck: no JVD, supple, symmetrical, trachea midline, thyroid not enlarged, symmetric, no tenderness/mass/nodules and Palpable small left lower cervical lymph node, firm to palpation Lymph nodes: Palpable small left lower cervical lymph node, firm to palpation Resp: clear to auscultation bilaterally Back: symmetric, no curvature. ROM normal. No CVA tenderness. Cardio: regular rate and rhythm, S1, S2 normal, no murmur, click, rub or gallop GI: soft, non-tender; bowel sounds normal; no masses,  no organomegaly Extremities: extremities normal, atraumatic, no cyanosis or edema Neurologic: Alert and oriented X 3, normal strength and tone. Normal symmetric reflexes. Normal coordination and gait  ECOG PERFORMANCE STATUS: 0 - Asymptomatic  There were no vitals taken for this visit.  LABORATORY DATA: Lab Results  Component Value Date   WBC 16.1* 04/21/2014   HGB 10.6* 04/21/2014   HCT 30.7* 04/21/2014   MCV 92.7 04/21/2014   PLT 239 04/21/2014      Chemistry      Component Value Date/Time   NA 137 04/21/2014 0915   NA 138 04/03/2014 1315   K 4.8 04/21/2014 0915   K 3.7 04/03/2014 1315   CL 102 04/03/2014 1315   CO2 26 04/21/2014 0915   CO2 27 04/03/2014 1315   BUN 11.9 04/21/2014 0915   BUN 15 04/03/2014 1315   CREATININE 1.2 04/21/2014 0915   CREATININE 1.06 04/03/2014 1315      Component Value Date/Time   CALCIUM 9.5 04/17/2014 0823   CALCIUM 9.4 04/03/2014 1315   ALKPHOS 129 04/17/2014 0823   ALKPHOS 70 04/03/2014  1315   AST 35* 04/17/2014 0823   AST 34 04/03/2014 1315   ALT 54 04/17/2014 0823   ALT 37 04/03/2014 1315   BILITOT 0.37 04/17/2014 0823   BILITOT 0.4 04/03/2014 1315       RADIOGRAPHIC STUDIES: Ct Soft Tissue Neck W Contrast  04/20/2014   CLINICAL DATA:  Follow-up Hodgkin's lymphoma  EXAM: CT OF THE NECK WITH CONTRAST  CT OF THE CHEST WITH CONTRAST  CT OF THE ABDOMEN AND PELVIS WITH CONTRAST  TECHNIQUE: Multidetector CT imaging of the neck was performed with intravenous contrast.; Multidetector CT imaging of the abdomen and pelvis was performed following the standard protocol during bolus administration of intravenous contrast.; Multidetector CT imaging of the chest was performed following the standard protocol during bolus administration of intravenous contrast.  CONTRAST:  113mL OMNIPAQUE IOHEXOL 300 MG/ML  SOLN  COMPARISON:  PET-CT dated 01/26/2014. CT neck, chest, and abdomen/pelvis dated 01/17/2014.  FINDINGS: CT NECK FINDINGS  Pharynx and larynx: Within normal limits.  Salivary glands: Within normal limits.  Thyroid: Within normal limits.  Lymph nodes: Mildly prominent left lower cervical nodes measuring up to 7 mm short  axis (series 8/ image 69), previously 8 mm.  Left supraclavicular lymph nodes measuring up to 10 mm short axis (series 8/ image 91), previously 16 mm.  Vascular: Within normal limits.  Limited intracranial: Within normal limits.  Visualized orbits: Visualized globes and retroconal soft tissues are within normal limits.  Mastoids and visualized paranasal sinuses: Visualized paranasal sinuses and mastoid air cells are clear.  Skeleton: Mild degenerative changes at C5-6.  CT CHEST FINDINGS  Mediastinum/Nodes: The heart is normal in size. No pericardial effusion.  Three vessel coronary atherosclerosis.  Mild thoracic lymphadenopathy, including:  --17 mm short axis left axillary node (Series 3/ image 11), previously 23 mm  --13 mm short axis right axillary node (series 3/ image 12),  unchanged  --12 mm short axis low right paratracheal node (series 3/image 20), previously 19 mm  --8 mm short axis subcarinal node (series 3/ image 24), previously 18 mm  Lungs/Pleura: 3 mm left lower lobe nodule (series 6/ image 32), unchanged.  Mild centrilobular emphysematous changes.  No focal consolidation.  No pleural effusion or pneumothorax.  Musculoskeletal: Mild degenerative changes of the thoracic spine.  CT ABDOMEN PELVIS FINDINGS  Hepatobiliary: Liver is notable for hepatic steatosis with mild focal fatty sparing in the gallbladder fossa.  Gallbladder is unremarkable. No intrahepatic or extrahepatic ductal dilatation.  Pancreas: Within normal limits.  Spleen: Within normal limits.  Adrenals/Urinary Tract: Adrenal glands are within normal limits.  Kidneys are within normal limits.  No hydronephrosis.  Bladder is within normal limits.  Stomach/Bowel: Stomach is within normal limits.  No evidence of bowel obstruction.  Normal appendix.  Mild to moderate colonic stool burden.  Vascular/Lymphatic: No evidence of abdominal aortic aneurysm.  Stable 7.6 x 5.5 cm calcified mass in the porta hepatis, compatible with known calcified hepatic artery aneurysm, with adjacent embolization coils.  Mildly enlarged retroperitoneal nodes, including:  --17 mm short axis left para-aortic node (series 3/ image 85), previously 22 mm  --10 mm short axis right common iliac node (series 3/image 91), unchanged  Reproductive: Prostate is unremarkable.  Other: Mild degenerative changes of the lumbar spine.  Musculoskeletal: Scalloping along the posterior aspect of the L1 vertebral body (sagittal image 65), unchanged.  No focal osseous lesions.  IMPRESSION: Left supraclavicular, axillary, mediastinal, and para-aortic lymphadenopathy, mildly improved.   Electronically Signed   By: Julian Hy M.D.   On: 04/20/2014 10:16   Ct Chest W Contrast  04/20/2014   CLINICAL DATA:  Follow-up Hodgkin's lymphoma  EXAM: CT OF THE NECK WITH  CONTRAST  CT OF THE CHEST WITH CONTRAST  CT OF THE ABDOMEN AND PELVIS WITH CONTRAST  TECHNIQUE: Multidetector CT imaging of the neck was performed with intravenous contrast.; Multidetector CT imaging of the abdomen and pelvis was performed following the standard protocol during bolus administration of intravenous contrast.; Multidetector CT imaging of the chest was performed following the standard protocol during bolus administration of intravenous contrast.  CONTRAST:  159mL OMNIPAQUE IOHEXOL 300 MG/ML  SOLN  COMPARISON:  PET-CT dated 01/26/2014. CT neck, chest, and abdomen/pelvis dated 01/17/2014.  FINDINGS: CT NECK FINDINGS  Pharynx and larynx: Within normal limits.  Salivary glands: Within normal limits.  Thyroid: Within normal limits.  Lymph nodes: Mildly prominent left lower cervical nodes measuring up to 7 mm short axis (series 8/ image 69), previously 8 mm.  Left supraclavicular lymph nodes measuring up to 10 mm short axis (series 8/ image 91), previously 16 mm.  Vascular: Within normal limits.  Limited intracranial: Within normal limits.  Visualized orbits: Visualized globes and retroconal soft tissues are within normal limits.  Mastoids and visualized paranasal sinuses: Visualized paranasal sinuses and mastoid air cells are clear.  Skeleton: Mild degenerative changes at C5-6.  CT CHEST FINDINGS  Mediastinum/Nodes: The heart is normal in size. No pericardial effusion.  Three vessel coronary atherosclerosis.  Mild thoracic lymphadenopathy, including:  --17 mm short axis left axillary node (Series 3/ image 11), previously 23 mm  --13 mm short axis right axillary node (series 3/ image 12), unchanged  --12 mm short axis low right paratracheal node (series 3/image 20), previously 19 mm  --8 mm short axis subcarinal node (series 3/ image 24), previously 18 mm  Lungs/Pleura: 3 mm left lower lobe nodule (series 6/ image 32), unchanged.  Mild centrilobular emphysematous changes.  No focal consolidation.  No pleural  effusion or pneumothorax.  Musculoskeletal: Mild degenerative changes of the thoracic spine.  CT ABDOMEN PELVIS FINDINGS  Hepatobiliary: Liver is notable for hepatic steatosis with mild focal fatty sparing in the gallbladder fossa.  Gallbladder is unremarkable. No intrahepatic or extrahepatic ductal dilatation.  Pancreas: Within normal limits.  Spleen: Within normal limits.  Adrenals/Urinary Tract: Adrenal glands are within normal limits.  Kidneys are within normal limits.  No hydronephrosis.  Bladder is within normal limits.  Stomach/Bowel: Stomach is within normal limits.  No evidence of bowel obstruction.  Normal appendix.  Mild to moderate colonic stool burden.  Vascular/Lymphatic: No evidence of abdominal aortic aneurysm.  Stable 7.6 x 5.5 cm calcified mass in the porta hepatis, compatible with known calcified hepatic artery aneurysm, with adjacent embolization coils.  Mildly enlarged retroperitoneal nodes, including:  --17 mm short axis left para-aortic node (series 3/ image 85), previously 22 mm  --10 mm short axis right common iliac node (series 3/image 91), unchanged  Reproductive: Prostate is unremarkable.  Other: Mild degenerative changes of the lumbar spine.  Musculoskeletal: Scalloping along the posterior aspect of the L1 vertebral body (sagittal image 65), unchanged.  No focal osseous lesions.  IMPRESSION: Left supraclavicular, axillary, mediastinal, and para-aortic lymphadenopathy, mildly improved.   Electronically Signed   By: Julian Hy M.D.   On: 04/20/2014 10:16   Ct Abdomen Pelvis W Contrast  04/20/2014   CLINICAL DATA:  Follow-up Hodgkin's lymphoma  EXAM: CT OF THE NECK WITH CONTRAST  CT OF THE CHEST WITH CONTRAST  CT OF THE ABDOMEN AND PELVIS WITH CONTRAST  TECHNIQUE: Multidetector CT imaging of the neck was performed with intravenous contrast.; Multidetector CT imaging of the abdomen and pelvis was performed following the standard protocol during bolus administration of intravenous  contrast.; Multidetector CT imaging of the chest was performed following the standard protocol during bolus administration of intravenous contrast.  CONTRAST:  166mL OMNIPAQUE IOHEXOL 300 MG/ML  SOLN  COMPARISON:  PET-CT dated 01/26/2014. CT neck, chest, and abdomen/pelvis dated 01/17/2014.  FINDINGS: CT NECK FINDINGS  Pharynx and larynx: Within normal limits.  Salivary glands: Within normal limits.  Thyroid: Within normal limits.  Lymph nodes: Mildly prominent left lower cervical nodes measuring up to 7 mm short axis (series 8/ image 69), previously 8 mm.  Left supraclavicular lymph nodes measuring up to 10 mm short axis (series 8/ image 91), previously 16 mm.  Vascular: Within normal limits.  Limited intracranial: Within normal limits.  Visualized orbits: Visualized globes and retroconal soft tissues are within normal limits.  Mastoids and visualized paranasal sinuses: Visualized paranasal sinuses and mastoid air cells are clear.  Skeleton: Mild degenerative changes at C5-6.  CT CHEST  FINDINGS  Mediastinum/Nodes: The heart is normal in size. No pericardial effusion.  Three vessel coronary atherosclerosis.  Mild thoracic lymphadenopathy, including:  --17 mm short axis left axillary node (Series 3/ image 11), previously 23 mm  --13 mm short axis right axillary node (series 3/ image 12), unchanged  --12 mm short axis low right paratracheal node (series 3/image 20), previously 19 mm  --8 mm short axis subcarinal node (series 3/ image 24), previously 18 mm  Lungs/Pleura: 3 mm left lower lobe nodule (series 6/ image 32), unchanged.  Mild centrilobular emphysematous changes.  No focal consolidation.  No pleural effusion or pneumothorax.  Musculoskeletal: Mild degenerative changes of the thoracic spine.  CT ABDOMEN PELVIS FINDINGS  Hepatobiliary: Liver is notable for hepatic steatosis with mild focal fatty sparing in the gallbladder fossa.  Gallbladder is unremarkable. No intrahepatic or extrahepatic ductal dilatation.   Pancreas: Within normal limits.  Spleen: Within normal limits.  Adrenals/Urinary Tract: Adrenal glands are within normal limits.  Kidneys are within normal limits.  No hydronephrosis.  Bladder is within normal limits.  Stomach/Bowel: Stomach is within normal limits.  No evidence of bowel obstruction.  Normal appendix.  Mild to moderate colonic stool burden.  Vascular/Lymphatic: No evidence of abdominal aortic aneurysm.  Stable 7.6 x 5.5 cm calcified mass in the porta hepatis, compatible with known calcified hepatic artery aneurysm, with adjacent embolization coils.  Mildly enlarged retroperitoneal nodes, including:  --17 mm short axis left para-aortic node (series 3/ image 85), previously 22 mm  --10 mm short axis right common iliac node (series 3/image 91), unchanged  Reproductive: Prostate is unremarkable.  Other: Mild degenerative changes of the lumbar spine.  Musculoskeletal: Scalloping along the posterior aspect of the L1 vertebral body (sagittal image 65), unchanged.  No focal osseous lesions.  IMPRESSION: Left supraclavicular, axillary, mediastinal, and para-aortic lymphadenopathy, mildly improved.   Electronically Signed   By: Julian Hy M.D.   On: 04/20/2014 10:16   ASSESSMENT AND PLAN: This is a very pleasant 49 years old white male with history of stage III Hodgkin's lymphoma status post 6 cycles of systemic chemotherapy with ABVD with almost complete response. He is currently undergoing salvage chemotherapy with ICE status post 2 cycles and tolerated the first cycle fairly well. The recent CT scan of the neck, chest, abdomen and pelvis showed partial improvement in his disease. I discussed the scan results with the patient today. I also faxed a copy of the report to Dr. Evette Doffing at [facility] stem cell transplant. Dr. Evette Doffing recommended proceeding with 2 more cycles of systemic chemotherapy before considering the patient for the stem cell transplant. We will arrange for the patient to  be admitted to [facility] early next week for the third cycle of his chemotherapy. He would come back for follow-up visit in 3 weeks for reevaluation before starting cycle #4.  He was advised to call immediately if he has any concerning symptoms in the interval. The patient voices understanding of current disease status and treatment options and is in agreement with the current care plan.  All questions were answered. The patient knows to call the clinic with any problems, questions or concerns. We can certainly see the patient much sooner if necessary.  Disclaimer: This note was dictated with voice recognition software. Similar sounding words can inadvertently be transcribed and may not be corrected upon review.

## 2014-04-21 NOTE — Telephone Encounter (Signed)
gv and printed appt sched and avs for pt for April  °

## 2014-04-21 NOTE — Progress Notes (Signed)
Copy of CT faxed to Dr. Evette Doffing at [facility] [phone] for review. Request if pt ready for transplant or will need additional chemotherapy. Spoke to [place] in office, Fax was received.

## 2014-04-21 NOTE — Telephone Encounter (Signed)
Call from [place] from [facility]. Per Dr. Clair Gulling pt will need 2 more cycles of chemo and restaging before possible transplant. Pt will receive chemo Monday inpatient. Bed placement called for pt admit moday 4/11. Called notified pt of above information, to expect a call regarding what time and details for Monday's treatment. Pt verbalized understanding.

## 2014-04-24 ENCOUNTER — Encounter (HOSPITAL_COMMUNITY): Payer: Self-pay

## 2014-04-24 ENCOUNTER — Other Ambulatory Visit: Payer: Commercial Managed Care - PPO

## 2014-04-24 ENCOUNTER — Encounter: Payer: Self-pay | Admitting: Medical Oncology

## 2014-04-24 ENCOUNTER — Inpatient Hospital Stay (HOSPITAL_COMMUNITY)
Admission: RE | Admit: 2014-04-24 | Discharge: 2014-04-27 | DRG: 846 | Disposition: A | Discharge status: Home / Self Care | Payer: Commercial Managed Care - PPO | Source: Ambulatory Visit | Attending: Internal Medicine | Admitting: Internal Medicine

## 2014-04-24 DIAGNOSIS — T458X5A Adverse effect of other primarily systemic and hematological agents, initial encounter: Secondary | ICD-10-CM | POA: Diagnosis present

## 2014-04-24 DIAGNOSIS — Z87891 Personal history of nicotine dependence: Secondary | ICD-10-CM

## 2014-04-24 DIAGNOSIS — D72829 Elevated white blood cell count, unspecified: Secondary | ICD-10-CM | POA: Diagnosis present

## 2014-04-24 DIAGNOSIS — F329 Major depressive disorder, single episode, unspecified: Secondary | ICD-10-CM | POA: Diagnosis present

## 2014-04-24 DIAGNOSIS — Z5111 Encounter for antineoplastic chemotherapy: Secondary | ICD-10-CM | POA: Diagnosis present

## 2014-04-24 DIAGNOSIS — E1165 Type 2 diabetes mellitus with hyperglycemia: Secondary | ICD-10-CM | POA: Diagnosis present

## 2014-04-24 DIAGNOSIS — E883 Tumor lysis syndrome: Secondary | ICD-10-CM | POA: Diagnosis present

## 2014-04-24 DIAGNOSIS — Z7901 Long term (current) use of anticoagulants: Secondary | ICD-10-CM

## 2014-04-24 DIAGNOSIS — T451X5A Adverse effect of antineoplastic and immunosuppressive drugs, initial encounter: Secondary | ICD-10-CM | POA: Diagnosis present

## 2014-04-24 DIAGNOSIS — E039 Hypothyroidism, unspecified: Secondary | ICD-10-CM

## 2014-04-24 DIAGNOSIS — Z794 Long term (current) use of insulin: Secondary | ICD-10-CM

## 2014-04-24 DIAGNOSIS — E119 Type 2 diabetes mellitus without complications: Secondary | ICD-10-CM

## 2014-04-24 DIAGNOSIS — D630 Anemia in neoplastic disease: Secondary | ICD-10-CM | POA: Diagnosis present

## 2014-04-24 DIAGNOSIS — C8190 Hodgkin lymphoma, unspecified, unspecified site: Secondary | ICD-10-CM | POA: Diagnosis present

## 2014-04-24 DIAGNOSIS — Z79899 Other long term (current) drug therapy: Secondary | ICD-10-CM

## 2014-04-24 DIAGNOSIS — C8120 Mixed cellularity classical Hodgkin lymphoma, unspecified site: Secondary | ICD-10-CM | POA: Diagnosis not present

## 2014-04-24 DIAGNOSIS — I10 Essential (primary) hypertension: Secondary | ICD-10-CM | POA: Diagnosis present

## 2014-04-24 LAB — CBC
HCT: 25.5 % — ABNORMAL LOW (ref 39.0–52.0)
Hemoglobin: 8.7 g/dL — ABNORMAL LOW (ref 13.0–17.0)
MCH: 32.3 pg (ref 26.0–34.0)
MCHC: 34.1 g/dL (ref 30.0–36.0)
MCV: 94.8 fL (ref 78.0–100.0)
PLATELETS: 289 10*3/uL (ref 150–400)
RBC: 2.69 MIL/uL — ABNORMAL LOW (ref 4.22–5.81)
RDW: 15 % (ref 11.5–15.5)
WBC: 12.4 10*3/uL — ABNORMAL HIGH (ref 4.0–10.5)

## 2014-04-24 LAB — GLUCOSE, CAPILLARY
GLUCOSE-CAPILLARY: 502 mg/dL — AB (ref 70–99)
Glucose-Capillary: 502 mg/dL — ABNORMAL HIGH (ref 70–99)
Glucose-Capillary: 424 mg/dL — ABNORMAL HIGH (ref 70–99)

## 2014-04-24 LAB — CREATININE, SERUM
Creatinine, Ser: 0.93 mg/dL (ref 0.50–1.35)
GFR calc Af Amer: >90 mL/min (ref >90)

## 2014-04-24 MED ORDER — SERTRALINE HCL 100 MG PO TABS
100.0000 mg | ORAL_TABLET | Freq: Every day | ORAL | Status: DC
  Administered 2014-04-25 – 2014-04-27 (×3): 100 mg via ORAL
  Filled 2014-04-24 (×4): qty 1

## 2014-04-24 MED ORDER — ALLOPURINOL 100 MG PO TABS
100.0000 mg | ORAL_TABLET | Freq: Two times a day (BID) | ORAL | Status: DC
  Filled 2014-04-24 (×2): qty 1

## 2014-04-24 MED ORDER — SODIUM CHLORIDE 0.9 % IV SOLN
INTRAVENOUS | Status: DC
Start: 2014-04-24 — End: 2014-04-27
  Administered 2014-04-24 – 2014-04-26 (×3): via INTRAVENOUS
  Administered 2014-04-27: 1000 mL via INTRAVENOUS

## 2014-04-24 MED ORDER — CEPHALEXIN 500 MG PO CAPS
500.0000 mg | ORAL_CAPSULE | Freq: Three times a day (TID) | ORAL | Status: DC
  Administered 2014-04-24 – 2014-04-26 (×7): 500 mg via ORAL
  Filled 2014-04-24 (×9): qty 1

## 2014-04-24 MED ORDER — LIDOCAINE-PRILOCAINE 2.5-2.5 % EX CREA
TOPICAL_CREAM | Freq: Once | CUTANEOUS | Status: AC
  Administered 2014-04-24: 11:00:00 via TOPICAL
  Filled 2014-04-24: qty 5

## 2014-04-24 MED ORDER — ALTEPLASE 2 MG IJ SOLR
2.0000 mg | Freq: Once | INTRAMUSCULAR | Status: AC | PRN
  Filled 2014-04-24: qty 2

## 2014-04-24 MED ORDER — OXYCODONE HCL 5 MG PO TABS: 5.0000 mg | ORAL_TABLET | Freq: Four times a day (QID) | ORAL | Status: DC | PRN

## 2014-04-24 MED ORDER — SODIUM CHLORIDE 0.9 % IV SOLN
INTRAVENOUS | Status: AC
  Administered 2014-04-24 – 2014-04-25 (×2): 16 mg via INTRAVENOUS
  Administered 2014-04-26: 36 mg via INTRAVENOUS
  Filled 2014-04-24 (×3): qty 8

## 2014-04-24 MED ORDER — METFORMIN HCL 500 MG PO TABS
1000.0000 mg | ORAL_TABLET | Freq: Two times a day (BID) | ORAL | Status: DC
  Administered 2014-04-24: 1000 mg via ORAL
  Filled 2014-04-24 (×2): qty 2

## 2014-04-24 MED ORDER — POTASSIUM CHLORIDE CRYS ER 10 MEQ PO TBCR
10.0000 meq | EXTENDED_RELEASE_TABLET | Freq: Every day | ORAL | Status: DC
  Administered 2014-04-24 – 2014-04-26 (×3): 10 meq via ORAL
  Filled 2014-04-24 (×4): qty 1

## 2014-04-24 MED ORDER — PROCHLORPERAZINE MALEATE 10 MG PO TABS: 10.0000 mg | ORAL_TABLET | Freq: Four times a day (QID) | ORAL | Status: DC | PRN

## 2014-04-24 MED ORDER — AZELASTINE HCL 0.1 % NA SOLN
1.0000 | Freq: Two times a day (BID) | NASAL | Status: DC
  Administered 2014-04-24 – 2014-04-26 (×5): 1 via NASAL
  Filled 2014-04-24: qty 30

## 2014-04-24 MED ORDER — INSULIN ASPART 100 UNIT/ML ~~LOC~~ SOLN
10.0000 [IU] | Freq: Once | SUBCUTANEOUS | Status: AC
  Administered 2014-04-24: 10 [IU] via SUBCUTANEOUS

## 2014-04-24 MED ORDER — SODIUM CHLORIDE 0.9 % IJ SOLN: 10.0000 mL | INTRAMUSCULAR | Status: DC | PRN

## 2014-04-24 MED ORDER — SODIUM CHLORIDE 0.9 % IV SOLN
100.0000 mg/m2 | INTRAVENOUS | Status: AC
  Administered 2014-04-24 – 2014-04-26 (×3): 230 mg via INTRAVENOUS
  Filled 2014-04-24 (×4): qty 11.5

## 2014-04-24 MED ORDER — HEPARIN SOD (PORK) LOCK FLUSH 100 UNIT/ML IV SOLN: 500.0000 [IU] | Freq: Once | INTRAVENOUS | Status: AC | PRN

## 2014-04-24 MED ORDER — HOT PACK MISC ONCOLOGY
1.0000 | Freq: Once | Status: AC | PRN
  Filled 2014-04-24: qty 1

## 2014-04-24 MED ORDER — PRAVASTATIN SODIUM 40 MG PO TABS
40.0000 mg | ORAL_TABLET | Freq: Every day | ORAL | Status: DC
  Administered 2014-04-24 – 2014-04-26 (×3): 40 mg via ORAL
  Filled 2014-04-24 (×4): qty 1

## 2014-04-24 MED ORDER — SODIUM CHLORIDE 0.9 % IV SOLN: Freq: Once | INTRAVENOUS | Status: DC

## 2014-04-24 MED ORDER — ZOLPIDEM TARTRATE 5 MG PO TABS
5.0000 mg | ORAL_TABLET | Freq: Every day | ORAL | Status: DC
Start: 2014-04-24 — End: 2014-04-27
  Administered 2014-04-24 – 2014-04-26 (×3): 5 mg via ORAL
  Filled 2014-04-24 (×3): qty 1

## 2014-04-24 MED ORDER — INSULIN ASPART 100 UNIT/ML ~~LOC~~ SOLN
0.0000 [IU] | Freq: Three times a day (TID) | SUBCUTANEOUS | Status: DC
  Administered 2014-04-25: 5 [IU] via SUBCUTANEOUS
  Administered 2014-04-25: 11 [IU] via SUBCUTANEOUS
  Administered 2014-04-25: 5 [IU] via SUBCUTANEOUS
  Administered 2014-04-26: 3 [IU] via SUBCUTANEOUS
  Administered 2014-04-26 (×2): 5 [IU] via SUBCUTANEOUS
  Administered 2014-04-27: 2 [IU] via SUBCUTANEOUS

## 2014-04-24 MED ORDER — MONTELUKAST SODIUM 10 MG PO TABS
10.0000 mg | ORAL_TABLET | Freq: Every day | ORAL | Status: DC
  Administered 2014-04-24 – 2014-04-26 (×3): 10 mg via ORAL
  Filled 2014-04-24 (×4): qty 1

## 2014-04-24 MED ORDER — ALLOPURINOL 300 MG PO TABS
300.0000 mg | ORAL_TABLET | Freq: Every day | ORAL | Status: DC
  Administered 2014-04-24 – 2014-04-27 (×4): 300 mg via ORAL
  Filled 2014-04-24 (×4): qty 1

## 2014-04-24 MED ORDER — ENOXAPARIN SODIUM 40 MG/0.4ML ~~LOC~~ SOLN
40.0000 mg | SUBCUTANEOUS | Status: DC
  Administered 2014-04-24 – 2014-04-26 (×3): 40 mg via SUBCUTANEOUS
  Filled 2014-04-24 (×4): qty 0.4

## 2014-04-24 MED ORDER — LOVASTATIN ER 40 MG PO TB24: 40.0000 mg | ORAL_TABLET | Freq: Every day | ORAL | Status: DC

## 2014-04-24 MED ORDER — FENOFIBRATE 54 MG PO TABS
54.0000 mg | ORAL_TABLET | Freq: Every day | ORAL | Status: DC
  Administered 2014-04-24 – 2014-04-26 (×3): 54 mg via ORAL
  Filled 2014-04-24 (×4): qty 1

## 2014-04-24 MED ORDER — AZELASTINE-FLUTICASONE 137-50 MCG/ACT NA SUSP: 1.0000 | Freq: Two times a day (BID) | NASAL | Status: DC

## 2014-04-24 MED ORDER — LISINOPRIL 10 MG PO TABS
10.0000 mg | ORAL_TABLET | Freq: Every day | ORAL | Status: DC
  Administered 2014-04-25 – 2014-04-27 (×3): 10 mg via ORAL
  Filled 2014-04-24 (×4): qty 1

## 2014-04-24 MED ORDER — FLUTICASONE PROPIONATE 50 MCG/ACT NA SUSP
1.0000 | Freq: Two times a day (BID) | NASAL | Status: DC
  Administered 2014-04-24 – 2014-04-26 (×5): 1 via NASAL
  Filled 2014-04-24: qty 16

## 2014-04-24 MED ORDER — LORATADINE 10 MG PO TABS
10.0000 mg | ORAL_TABLET | Freq: Every day | ORAL | Status: DC
  Administered 2014-04-24 – 2014-04-26 (×3): 10 mg via ORAL
  Filled 2014-04-24 (×4): qty 1

## 2014-04-24 MED ORDER — CARBOPLATIN CHEMO INJECTION 600 MG/60ML: 683.5000 mg | Freq: Once | INTRAVENOUS | Status: DC

## 2014-04-24 MED ORDER — CARVEDILOL 6.25 MG PO TABS
6.2500 mg | ORAL_TABLET | Freq: Two times a day (BID) | ORAL | Status: DC
  Administered 2014-04-24 – 2014-04-26 (×6): 6.25 mg via ORAL
  Filled 2014-04-24 (×8): qty 1

## 2014-04-24 MED ORDER — HEPARIN SOD (PORK) LOCK FLUSH 100 UNIT/ML IV SOLN: 250.0000 [IU] | Freq: Once | INTRAVENOUS | Status: AC | PRN

## 2014-04-24 MED ORDER — SODIUM CHLORIDE 0.9 % IV SOLN
INTRAVENOUS | Status: DC
Start: 2014-04-24 — End: 2014-04-27
  Administered 2014-04-24: 12:00:00 via INTRAVENOUS

## 2014-04-24 MED ORDER — SODIUM CHLORIDE 0.9 % IJ SOLN: 3.0000 mL | INTRAMUSCULAR | Status: DC | PRN

## 2014-04-24 MED ORDER — LEVOTHYROXINE SODIUM 200 MCG PO TABS
200.0000 ug | ORAL_TABLET | Freq: Every day | ORAL | Status: DC
  Administered 2014-04-25 – 2014-04-27 (×3): 200 ug via ORAL
  Filled 2014-04-24 (×4): qty 1

## 2014-04-24 NOTE — H&P (Signed)
[place] [phone]   [phone]  HISTORY AND PHYSICAL  REASON FOR ADMISSION:  49 years old white male with relapsed Hodgkin's disease admitted for chemotherapy.  HPI [name] [name] is a 49 y.o. male with past medical history significant for hypertension, diabetes mellitus, depression as well as relapsed Hodgkin disease currently undergoing salvage chemotherapy with ICE (ifosfamide/mesna, carboplatin and etoposide) status post 2 cycles. Restaging scan after cycle #2 showed partial response of his disease. The scan results was discussed with Dr. Evette Doffing at [facility] stem cell transplant unit. He recommended to proceed with 2 more cycles of salvage chemotherapy before considering the patient for stem cell transplant. The patient is here today to start cycle #3. He is feeling fine with no specific complaints. He denied having any significant fever or chills, no nausea or vomiting. The patient denied having any significant chest pain, shortness breath, cough or hemoptysis. HPI  Past Medical History  Diagnosis Date  . Hypertension   . Diabetes mellitus without complication     Pre-diabetes  . Depression   . Headache(784.0)   . Thyroid disease     Hx: of  . Cancer 2015    cervical lymphadenopathy  . Hodgkin lymphoma 2015  . Hepatic artery aneurysm 2015    Past Surgical History  Procedure Laterality Date  . Tonsillectomy    . Mass excision Left 12/31/2012    Procedure: CERVICAL LYMPH NODE BIOPSY;  Surgeon: [name], MD;  Location: [place];  Service: General;  Laterality: Left;    Family History  Problem Relation Age of Onset  . Aneurysm Mother 60    brain  . Obesity Mother   . Heart disease Mother   . AAA (abdominal aortic aneurysm) Mother     dx in her 3s  . Hodgkin's lymphoma Father     dx in his 28s, and again in his 107s  . Diabetes Father   . Cancer Brother     NOS dx in his 15s  . Bone cancer Maternal Uncle 40   don't know if primary or metastises    Social History History  Substance Use Topics  . Smoking status: Former Smoker -- 1.00 packs/day for 31 years    Types: Cigarettes    Quit date: 06/07/2012  . Smokeless tobacco: Never Used  . Alcohol Use: No    No Known Allergies  Current Facility-Administered Medications  Medication Dose Route Frequency Provider Last Rate Last Dose  . 0.9 %  sodium chloride infusion   Intravenous Continuous [name], MD 50 mL/hr at 04/24/14 1215    . 0.9 %  sodium chloride infusion   Intravenous Continuous [name], MD 20 mL/hr at 04/24/14 1215    . allopurinol (ZYLOPRIM) tablet 300 mg  300 mg Oral Daily [name], MD   300 mg at 04/24/14 1348  . alteplase (CATHFLO ACTIVASE) injection 2 mg  2 mg Intracatheter Once PRN [name], MD      . Azelastine-Fluticasone 137-50 MCG/ACT SUSP 1 spray  1 spray Nasal BID [name], MD      . Derrill Memo ON 04/25/2014] CARBOplatin (PARAPLATIN) 680 mg in sodium chloride 0.9 % 250 mL chemo infusion  680 mg Intravenous Once [name], MD      . carvedilol (COREG) tablet 6.25 mg  6.25 mg Oral BID [name], MD   6.25 mg at 04/24/14 1432  . enoxaparin (LOVENOX) injection 40 mg  40 mg Subcutaneous Q24H [name],  MD   40 mg at 04/24/14 1348  . etoposide (VEPESID) 230 mg in sodium chloride 0.9 % 600 mL chemo infusion  100 mg/m2 (Treatment Plan Actual) Intravenous Q24H [name], MD 612 mL/hr at 04/24/14 1430 230 mg at 04/24/14 1430  . fenofibrate tablet 54 mg  54 mg Oral Daily [name], MD   54 mg at 04/24/14 1432  . heparin lock flush 100 unit/mL  500 Units Intracatheter Once PRN [name], MD      . heparin lock flush 100 unit/mL  250 Units Intracatheter Once PRN [name], MD      . Hot Pack 1 packet  1 packet Topical Once PRN [name], MD      . Derrill Memo ON 04/25/2014] ifosfamide (IFEX) 11,400 mg, mesna (MESNEX) 11,400 mg in sodium chloride 0.9 % 1,000 mL chemo  infusion   Intravenous Once [name], MD      . Derrill Memo ON 04/25/2014] levothyroxine (SYNTHROID, LEVOTHROID) tablet 200 mcg  200 mcg Oral QAC breakfast [name], MD      . Derrill Memo ON 04/25/2014] lisinopril (PRINIVIL,ZESTRIL) tablet 10 mg  10 mg Oral Q breakfast [name], MD      . loratadine (CLARITIN) tablet 10 mg  10 mg Oral Daily [name], MD   10 mg at 04/24/14 1432  . metFORMIN (GLUCOPHAGE) tablet 1,000 mg  1,000 mg Oral BID WC [name], MD   1,000 mg at 04/24/14 1651  . montelukast (SINGULAIR) tablet 10 mg  10 mg Oral QHS [name], MD      . ondansetron (ZOFRAN) 16 mg, dexamethasone (DECADRON) 20 mg in sodium chloride 0.9 % 50 mL IVPB   Intravenous Q24H [name], MD 104 mL/hr at 04/24/14 1348 16 mg at 04/24/14 1348  . oxyCODONE (Oxy IR/ROXICODONE) immediate release tablet 5 mg  5 mg Oral Q6H PRN [name], MD      . potassium chloride (K-DUR,KLOR-CON) CR tablet 10 mEq  10 mEq Oral Daily [name], MD   10 mEq at 04/24/14 1432  . pravastatin (PRAVACHOL) tablet 40 mg  40 mg Oral QHS [name], MD      . prochlorperazine (COMPAZINE) tablet 10 mg  10 mg Oral Q6H PRN [name], MD      . Derrill Memo ON 04/25/2014] sertraline (ZOLOFT) tablet 100 mg  100 mg Oral Q breakfast [name], MD      . sodium chloride 0.9 % injection 10 mL  10 mL Intracatheter PRN [name], MD      . sodium chloride 0.9 % injection 3 mL  3 mL Intravenous PRN [name], MD      . zolpidem (AMBIEN) tablet 5 mg  5 mg Oral QHS [name], MD        Review of Systems  Constitutional: negative Eyes: negative Ears, nose, mouth, throat, and face: negative Respiratory: negative Cardiovascular: negative Gastrointestinal: negative Genitourinary:negative Integument/breast: negative Hematologic/lymphatic: negative Musculoskeletal:negative Neurological: negative Behavioral/Psych: negative Endocrine: negative Allergic/Immunologic:  negative  Physical Exam  BJY:NWGNF, healthy, no distress, well nourished and well developed SKIN: skin color, texture, turgor are normal, no rashes or significant lesions HEAD: Normocephalic, No masses, lesions, tenderness or abnormalities EYES: normal, PERRLA EARS: External ears normal, Canals clear OROPHARYNX:no exudate, no erythema and lips, buccal mucosa, and tongue normal  NECK: supple, no adenopathy, no JVD LYMPH:  no palpable lymphadenopathy, no hepatosplenomegaly LUNGS: clear to auscultation , and palpation HEART: regular rate & rhythm, no murmurs and no gallops ABDOMEN:abdomen soft, non-tender,  obese, normal bowel sounds and no masses or organomegaly BACK: Back symmetric, no curvature., No CVA tenderness EXTREMITIES:no joint deformities, effusion, or inflammation, no edema, no skin discoloration  NEURO: alert & oriented x 3 with fluent speech, no focal motor/sensory deficits  PERFORMANCE STATUS: ECOG 1  LABORATORY DATA: Lab Results  Component Value Date   WBC 12.4* 04/24/2014   HGB 8.7* 04/24/2014   HCT 25.5* 04/24/2014   MCV 94.8 04/24/2014   PLT 289 04/24/2014    @LASTCHEM @  RADIOGRAPHIC STUDIES: Ct Soft Tissue Neck W Contrast  04/20/2014   CLINICAL DATA:  Follow-up Hodgkin's lymphoma  EXAM: CT OF THE NECK WITH CONTRAST  CT OF THE CHEST WITH CONTRAST  CT OF THE ABDOMEN AND PELVIS WITH CONTRAST  TECHNIQUE: Multidetector CT imaging of the neck was performed with intravenous contrast.; Multidetector CT imaging of the abdomen and pelvis was performed following the standard protocol during bolus administration of intravenous contrast.; Multidetector CT imaging of the chest was performed following the standard protocol during bolus administration of intravenous contrast.  CONTRAST:  123mL OMNIPAQUE IOHEXOL 300 MG/ML  SOLN  COMPARISON:  PET-CT dated 01/26/2014. CT neck, chest, and abdomen/pelvis dated 01/17/2014.  FINDINGS: CT NECK FINDINGS  Pharynx and larynx: Within normal  limits.  Salivary glands: Within normal limits.  Thyroid: Within normal limits.  Lymph nodes: Mildly prominent left lower cervical nodes measuring up to 7 mm short axis (series 8/ image 69), previously 8 mm.  Left supraclavicular lymph nodes measuring up to 10 mm short axis (series 8/ image 91), previously 16 mm.  Vascular: Within normal limits.  Limited intracranial: Within normal limits.  Visualized orbits: Visualized globes and retroconal soft tissues are within normal limits.  Mastoids and visualized paranasal sinuses: Visualized paranasal sinuses and mastoid air cells are clear.  Skeleton: Mild degenerative changes at C5-6.  CT CHEST FINDINGS  Mediastinum/Nodes: The heart is normal in size. No pericardial effusion.  Three vessel coronary atherosclerosis.  Mild thoracic lymphadenopathy, including:  --17 mm short axis left axillary node (Series 3/ image 11), previously 23 mm  --13 mm short axis right axillary node (series 3/ image 12), unchanged  --12 mm short axis low right paratracheal node (series 3/image 20), previously 19 mm  --8 mm short axis subcarinal node (series 3/ image 24), previously 18 mm  Lungs/Pleura: 3 mm left lower lobe nodule (series 6/ image 32), unchanged.  Mild centrilobular emphysematous changes.  No focal consolidation.  No pleural effusion or pneumothorax.  Musculoskeletal: Mild degenerative changes of the thoracic spine.  CT ABDOMEN PELVIS FINDINGS  Hepatobiliary: Liver is notable for hepatic steatosis with mild focal fatty sparing in the gallbladder fossa.  Gallbladder is unremarkable. No intrahepatic or extrahepatic ductal dilatation.  Pancreas: Within normal limits.  Spleen: Within normal limits.  Adrenals/Urinary Tract: Adrenal glands are within normal limits.  Kidneys are within normal limits.  No hydronephrosis.  Bladder is within normal limits.  Stomach/Bowel: Stomach is within normal limits.  No evidence of bowel obstruction.  Normal appendix.  Mild to moderate colonic stool  burden.  Vascular/Lymphatic: No evidence of abdominal aortic aneurysm.  Stable 7.6 x 5.5 cm calcified mass in the porta hepatis, compatible with known calcified hepatic artery aneurysm, with adjacent embolization coils.  Mildly enlarged retroperitoneal nodes, including:  --17 mm short axis left para-aortic node (series 3/ image 85), previously 22 mm  --10 mm short axis right common iliac node (series 3/image 91), unchanged  Reproductive: Prostate is unremarkable.  Other: Mild degenerative changes of the  lumbar spine.  Musculoskeletal: Scalloping along the posterior aspect of the L1 vertebral body (sagittal image 65), unchanged.  No focal osseous lesions.  IMPRESSION: Left supraclavicular, axillary, mediastinal, and para-aortic lymphadenopathy, mildly improved.   Electronically Signed   By: Julian Hy M.D.   On: 04/20/2014 10:16   Ct Chest W Contrast  04/20/2014   CLINICAL DATA:  Follow-up Hodgkin's lymphoma  EXAM: CT OF THE NECK WITH CONTRAST  CT OF THE CHEST WITH CONTRAST  CT OF THE ABDOMEN AND PELVIS WITH CONTRAST  TECHNIQUE: Multidetector CT imaging of the neck was performed with intravenous contrast.; Multidetector CT imaging of the abdomen and pelvis was performed following the standard protocol during bolus administration of intravenous contrast.; Multidetector CT imaging of the chest was performed following the standard protocol during bolus administration of intravenous contrast.  CONTRAST:  135mL OMNIPAQUE IOHEXOL 300 MG/ML  SOLN  COMPARISON:  PET-CT dated 01/26/2014. CT neck, chest, and abdomen/pelvis dated 01/17/2014.  FINDINGS: CT NECK FINDINGS  Pharynx and larynx: Within normal limits.  Salivary glands: Within normal limits.  Thyroid: Within normal limits.  Lymph nodes: Mildly prominent left lower cervical nodes measuring up to 7 mm short axis (series 8/ image 69), previously 8 mm.  Left supraclavicular lymph nodes measuring up to 10 mm short axis (series 8/ image 91), previously 16 mm.   Vascular: Within normal limits.  Limited intracranial: Within normal limits.  Visualized orbits: Visualized globes and retroconal soft tissues are within normal limits.  Mastoids and visualized paranasal sinuses: Visualized paranasal sinuses and mastoid air cells are clear.  Skeleton: Mild degenerative changes at C5-6.  CT CHEST FINDINGS  Mediastinum/Nodes: The heart is normal in size. No pericardial effusion.  Three vessel coronary atherosclerosis.  Mild thoracic lymphadenopathy, including:  --17 mm short axis left axillary node (Series 3/ image 11), previously 23 mm  --13 mm short axis right axillary node (series 3/ image 12), unchanged  --12 mm short axis low right paratracheal node (series 3/image 20), previously 19 mm  --8 mm short axis subcarinal node (series 3/ image 24), previously 18 mm  Lungs/Pleura: 3 mm left lower lobe nodule (series 6/ image 32), unchanged.  Mild centrilobular emphysematous changes.  No focal consolidation.  No pleural effusion or pneumothorax.  Musculoskeletal: Mild degenerative changes of the thoracic spine.  CT ABDOMEN PELVIS FINDINGS  Hepatobiliary: Liver is notable for hepatic steatosis with mild focal fatty sparing in the gallbladder fossa.  Gallbladder is unremarkable. No intrahepatic or extrahepatic ductal dilatation.  Pancreas: Within normal limits.  Spleen: Within normal limits.  Adrenals/Urinary Tract: Adrenal glands are within normal limits.  Kidneys are within normal limits.  No hydronephrosis.  Bladder is within normal limits.  Stomach/Bowel: Stomach is within normal limits.  No evidence of bowel obstruction.  Normal appendix.  Mild to moderate colonic stool burden.  Vascular/Lymphatic: No evidence of abdominal aortic aneurysm.  Stable 7.6 x 5.5 cm calcified mass in the porta hepatis, compatible with known calcified hepatic artery aneurysm, with adjacent embolization coils.  Mildly enlarged retroperitoneal nodes, including:  --17 mm short axis left para-aortic node (series  3/ image 85), previously 22 mm  --10 mm short axis right common iliac node (series 3/image 91), unchanged  Reproductive: Prostate is unremarkable.  Other: Mild degenerative changes of the lumbar spine.  Musculoskeletal: Scalloping along the posterior aspect of the L1 vertebral body (sagittal image 65), unchanged.  No focal osseous lesions.  IMPRESSION: Left supraclavicular, axillary, mediastinal, and para-aortic lymphadenopathy, mildly improved.   Electronically Signed  By: Julian Hy M.D.   On: 04/20/2014 10:16   Ct Abdomen Pelvis W Contrast  04/20/2014   CLINICAL DATA:  Follow-up Hodgkin's lymphoma  EXAM: CT OF THE NECK WITH CONTRAST  CT OF THE CHEST WITH CONTRAST  CT OF THE ABDOMEN AND PELVIS WITH CONTRAST  TECHNIQUE: Multidetector CT imaging of the neck was performed with intravenous contrast.; Multidetector CT imaging of the abdomen and pelvis was performed following the standard protocol during bolus administration of intravenous contrast.; Multidetector CT imaging of the chest was performed following the standard protocol during bolus administration of intravenous contrast.  CONTRAST:  132mL OMNIPAQUE IOHEXOL 300 MG/ML  SOLN  COMPARISON:  PET-CT dated 01/26/2014. CT neck, chest, and abdomen/pelvis dated 01/17/2014.  FINDINGS: CT NECK FINDINGS  Pharynx and larynx: Within normal limits.  Salivary glands: Within normal limits.  Thyroid: Within normal limits.  Lymph nodes: Mildly prominent left lower cervical nodes measuring up to 7 mm short axis (series 8/ image 69), previously 8 mm.  Left supraclavicular lymph nodes measuring up to 10 mm short axis (series 8/ image 91), previously 16 mm.  Vascular: Within normal limits.  Limited intracranial: Within normal limits.  Visualized orbits: Visualized globes and retroconal soft tissues are within normal limits.  Mastoids and visualized paranasal sinuses: Visualized paranasal sinuses and mastoid air cells are clear.  Skeleton: Mild degenerative changes at  C5-6.  CT CHEST FINDINGS  Mediastinum/Nodes: The heart is normal in size. No pericardial effusion.  Three vessel coronary atherosclerosis.  Mild thoracic lymphadenopathy, including:  --17 mm short axis left axillary node (Series 3/ image 11), previously 23 mm  --13 mm short axis right axillary node (series 3/ image 12), unchanged  --12 mm short axis low right paratracheal node (series 3/image 20), previously 19 mm  --8 mm short axis subcarinal node (series 3/ image 24), previously 18 mm  Lungs/Pleura: 3 mm left lower lobe nodule (series 6/ image 32), unchanged.  Mild centrilobular emphysematous changes.  No focal consolidation.  No pleural effusion or pneumothorax.  Musculoskeletal: Mild degenerative changes of the thoracic spine.  CT ABDOMEN PELVIS FINDINGS  Hepatobiliary: Liver is notable for hepatic steatosis with mild focal fatty sparing in the gallbladder fossa.  Gallbladder is unremarkable. No intrahepatic or extrahepatic ductal dilatation.  Pancreas: Within normal limits.  Spleen: Within normal limits.  Adrenals/Urinary Tract: Adrenal glands are within normal limits.  Kidneys are within normal limits.  No hydronephrosis.  Bladder is within normal limits.  Stomach/Bowel: Stomach is within normal limits.  No evidence of bowel obstruction.  Normal appendix.  Mild to moderate colonic stool burden.  Vascular/Lymphatic: No evidence of abdominal aortic aneurysm.  Stable 7.6 x 5.5 cm calcified mass in the porta hepatis, compatible with known calcified hepatic artery aneurysm, with adjacent embolization coils.  Mildly enlarged retroperitoneal nodes, including:  --17 mm short axis left para-aortic node (series 3/ image 85), previously 22 mm  --10 mm short axis right common iliac node (series 3/image 91), unchanged  Reproductive: Prostate is unremarkable.  Other: Mild degenerative changes of the lumbar spine.  Musculoskeletal: Scalloping along the posterior aspect of the L1 vertebral body (sagittal image 65), unchanged.   No focal osseous lesions.  IMPRESSION: Left supraclavicular, axillary, mediastinal, and para-aortic lymphadenopathy, mildly improved.   Electronically Signed   By: Julian Hy M.D.   On: 04/20/2014 10:16    ASSESSMENT and PLAN:  1) relapsed Hodgkin's lymphoma: The patient is admitted today for cycle #3 of salvage chemotherapy with ICE. We'll start the  patient on hydration with IV normal saline. The patient will continue on allopurinol for tumor lysis prophylaxis. 2) hypertension: The patient will continue on Coreg and lisinopril. 3) diabetes mellitus: He will continue his current treatment with metformin. 4) DVT prophylaxis: We'll start the patient on Lovenox 40 mg subcutaneously daily. 5) hypothyroidism: Continue levothyroxine. I will continue to monitor his blood work on daily basis.   The patient voices understanding of current disease status and treatment options and is in agreement with the current care plan.  Disclaimer: This note was dictated with voice recognition software. Similar sounding words can inadvertently be transcribed and may not be corrected upon review.   [place] Keena K. April 24, 2014, 5:15 PM

## 2014-04-24 NOTE — Telephone Encounter (Signed)
erroneous

## 2014-04-25 DIAGNOSIS — D630 Anemia in neoplastic disease: Secondary | ICD-10-CM

## 2014-04-25 DIAGNOSIS — D72828 Other elevated white blood cell count: Secondary | ICD-10-CM

## 2014-04-25 LAB — CBC WITH DIFFERENTIAL/PLATELET
Basophils Absolute: 0 10*3/uL (ref 0.0–0.1)
Basophils Relative: 0 % (ref 0–1)
EOS ABS: 0 10*3/uL (ref 0.0–0.7)
EOS PCT: 0 % (ref 0–5)
HCT: 29 % — ABNORMAL LOW (ref 39.0–52.0)
HEMOGLOBIN: 9.9 g/dL — AB (ref 13.0–17.0)
LYMPHS ABS: 1.2 10*3/uL (ref 0.7–4.0)
Lymphocytes Relative: 8 % — ABNORMAL LOW (ref 12–46)
MCH: 31.9 pg (ref 26.0–34.0)
MCHC: 34.1 g/dL (ref 30.0–36.0)
MCV: 93.5 fL (ref 78.0–100.0)
MONO ABS: 0.4 10*3/uL (ref 0.1–1.0)
MONOS PCT: 3 % (ref 3–12)
NEUTROS PCT: 89 % — AB (ref 43–77)
Neutro Abs: 14 10*3/uL — ABNORMAL HIGH (ref 1.7–7.7)
Platelets: 298 10*3/uL (ref 150–400)
RBC: 3.1 MIL/uL — ABNORMAL LOW (ref 4.22–5.81)
RDW: 15 % (ref 11.5–15.5)
WBC: 15.7 10*3/uL — ABNORMAL HIGH (ref 4.0–10.5)

## 2014-04-25 LAB — COMPREHENSIVE METABOLIC PANEL
ALT: 44 U/L (ref 0–53)
ANION GAP: 9 (ref 5–15)
AST: 32 U/L (ref 0–37)
Albumin: 4 g/dL (ref 3.5–5.2)
Alkaline Phosphatase: 81 U/L (ref 39–117)
BILIRUBIN TOTAL: 0.9 mg/dL (ref 0.3–1.2)
BUN: 17 mg/dL (ref 6–23)
CHLORIDE: 100 mmol/L (ref 96–112)
CO2: 25 mmol/L (ref 19–32)
CREATININE: 0.97 mg/dL (ref 0.50–1.35)
Calcium: 9.4 mg/dL (ref 8.4–10.5)
GLUCOSE: 227 mg/dL — AB (ref 70–99)
Potassium: 4 mmol/L (ref 3.5–5.1)
Sodium: 134 mmol/L — ABNORMAL LOW (ref 135–145)
Total Protein: 8.1 g/dL (ref 6.0–8.3)

## 2014-04-25 LAB — GLUCOSE, CAPILLARY
GLUCOSE-CAPILLARY: 224 mg/dL — AB (ref 70–99)
GLUCOSE-CAPILLARY: 205 mg/dL — AB (ref 70–99)
Glucose-Capillary: 205 mg/dL — ABNORMAL HIGH (ref 70–99)
Glucose-Capillary: 313 mg/dL — ABNORMAL HIGH (ref 70–99)
Glucose-Capillary: 335 mg/dL — ABNORMAL HIGH (ref 70–99)

## 2014-04-25 MED ORDER — SODIUM CHLORIDE 0.9 % IV SOLN
Freq: Once | INTRAVENOUS | Status: AC
  Administered 2014-04-25: 17:00:00 via INTRAVENOUS
  Filled 2014-04-25 (×2): qty 228

## 2014-04-25 MED ORDER — SODIUM CHLORIDE 0.9 % IV SOLN
683.5000 mg | Freq: Once | INTRAVENOUS | Status: AC
  Administered 2014-04-25: 680 mg via INTRAVENOUS
  Filled 2014-04-25 (×2): qty 68

## 2014-04-25 NOTE — Progress Notes (Signed)
BSA calculated and dosages of Etoposide, carboplatin, ifosfamide, and mesna based on normal dosages. Verified with Drue Dun, RN

## 2014-04-25 NOTE — Care Management Note (Signed)
CARE MANAGEMENT NOTE 04/25/2014  Patient:  [name] [name], [name] [name]   Account Number:  [id number]  Date Initiated:  04/25/2014  Documentation initiated by:  Marney Doctor  Subjective/Objective Assessment:   49 yo admitted with Hodgkin's disease for Chemotherapy     Action/Plan:   From home with spouse   Anticipated DC Date:  04/28/2014   Anticipated DC Plan:  [place]  CM consult      Choice offered to / List presented to:             Status of service:  In process, will continue to follow Medicare Important Message given?   (If response is "NO", the following Medicare IM given date fields will be blank) Date Medicare IM given:   Medicare IM given by:   Date Additional Medicare IM given:   Additional Medicare IM given by:    Discharge Disposition:    Per UR Regulation:  Reviewed for med. necessity/level of care/duration of stay  If discussed at [place] of Stay Meetings, dates discussed:    Comments:  04/25/14 Marney Doctor, RN,BSN,NCM [phone] Chart reviewed and CM following for DC needs.

## 2014-04-25 NOTE — Progress Notes (Signed)
[name] [name]   DOB:November 30, 1965   NL#:[id number]   XTK#:[id number]  Patient Care Team: Pcp Not In System as PCP - General Jacelyn Pi, MD as Consulting Physician (Endocrinology)  Subjective: Patient seen and examined. Tolerated day 1 chemotherapy. Denies fevers, chills, night sweats, vision changes, or mucositis. Had one hyperglycemic event last night controlled with ISS. No further events. Denies any respiratory complaints. Denies any chest pain or palpitations. Denies lower extremity swelling. Denies nausea, heartburn or change in bowel habits. Last bowel movement on 4/12  Appetite is normal. Denies any dysuria. Denies abnormal skin rashes, or neuropathy. Denies any bleeding issues such as epistaxis, hematemesis, hematuria or hematochezia. Ambulating without difficulty.   Scheduled Meds: . allopurinol  300 mg Oral Daily  . azelastine  1 spray Each Nare BID   And  . fluticasone  1 spray Each Nare BID  . CARBOplatin  680 mg Intravenous Once  . carvedilol  6.25 mg Oral BID  . cephALEXin  500 mg Oral TID  . enoxaparin (LOVENOX) injection  40 mg Subcutaneous Q24H  . etoposide  100 mg/m2 (Treatment Plan Actual) Intravenous Q24H  . fenofibrate  54 mg Oral Daily  . ifosfamide/mesna (IFEX/MESNEX) CHEMO for Inpatient CI in 1000 ml   Intravenous Once  . insulin aspart  0-15 Units Subcutaneous TID WC  . levothyroxine  200 mcg Oral QAC breakfast  . lisinopril  10 mg Oral Q breakfast  . loratadine  10 mg Oral Daily  . montelukast  10 mg Oral QHS  . ondansetron (ZOFRAN) with dexamethasone (DECADRON) IV   Intravenous Q24H  . potassium chloride  10 mEq Oral Daily  . pravastatin  40 mg Oral QHS  . sertraline  100 mg Oral Q breakfast  . zolpidem  5 mg Oral QHS   Continuous Infusions: . sodium chloride 50 mL/hr at 04/24/14 1215  . sodium chloride 20 mL/hr at 04/24/14 1215   PRN Meds:oxyCODONE, prochlorperazine, sodium chloride, sodium chloride   Objective:  Filed Vitals:   04/25/14 0510  BP:  114/78  Pulse: 85  Temp: 97.2 F (36.2 C)  Resp: 16      Intake/Output Summary (Last 24 hours) at 04/25/14 0758 Last data filed at 04/25/14 0610  Gross per 24 hour  Intake 1391.83 ml  Output   2100 ml  Net -708.17 ml    ECOG PERFORMANCE STATUS:1  GENERAL:alert, no distress and comfortable SKIN: skin color, texture, turgor are normal, no rashes or significant lesions EYES: normal, conjunctiva are pink and non-injected, sclera clear OROPHARYNX:no exudate, no erythema and lips, buccal mucosa, and tongue normal  NECK: supple, thyroid normal size, non-tender, without nodularity LYMPH:  no palpable lymphadenopathy in the cervical, axillary or inguinal LUNGS: clear to auscultation and percussion with normal breathing effort HEART: regular rate & rhythm and no murmurs and no lower extremity edema ABDOMEN: soft, non-tender and normal bowel sounds Musculoskeletal:no cyanosis of digits and no clubbing  PSYCH: alert & oriented x 3 with fluent speech NEURO: no focal motor/sensory deficits    CBG (last 3)   Recent Labs  04/24/14 2308 04/25/14 0608 04/25/14 0738  GLUCAP 424* 224* 205*     Labs:   Recent Labs Lab 04/21/14 0915 04/24/14 1220 04/25/14 0610  WBC 16.1* 12.4* 15.7*  HGB 10.6* 8.7* 9.9*  HCT 30.7* 25.5* 29.0*  PLT 239 289 298  MCV 92.7 94.8 93.5  MCH 32.0 32.3 31.9  MCHC 34.5 34.1 34.1  RDW 14.8* 15.0 15.0  LYMPHSABS 2.8  --  1.2  MONOABS 1.4*  --  0.4  EOSABS 0.0  --  0.0  BASOSABS 0.1  --  0.0     Chemistries:    Recent Labs Lab 04/21/14 0915 04/24/14 1220 04/25/14 0610  NA 137  --  134*  K 4.8  --  4.0  CL  --   --  100  CO2 26  --  25  GLUCOSE 297*  --  227*  BUN 11.9  --  17  CREATININE 1.2 0.93 0.97  CALCIUM 9.2  --  9.4  AST 33  --  32  ALT 50  --  44  ALKPHOS 125  --  81  BILITOT 0.46  --  0.9    GFR Estimated Creatinine Clearance: 109.9 mL/min (by C-G formula based on Cr of 0.97).  Liver Function Tests:  Recent Labs Lab  04/21/14 0915 04/25/14 0610  AST 33 32  ALT 50 44  ALKPHOS 125 81  BILITOT 0.46 0.9  PROT 7.7 8.1  ALBUMIN 3.8 4.0    Urine Studies     Component Value Date/Time   COLORURINE YELLOW 04/03/2014 1509   APPEARANCEUR CLEAR 04/03/2014 1509   LABSPEC 1.023 04/03/2014 1509   PHURINE 5.0 04/03/2014 1509   GLUCOSEU 100* 04/03/2014 1509   HGBUR MODERATE* 04/03/2014 1509   BILIRUBINUR NEGATIVE 04/03/2014 1509   KETONESUR NEGATIVE 04/03/2014 1509   PROTEINUR NEGATIVE 04/03/2014 1509   UROBILINOGEN 0.2 04/03/2014 1509   NITRITE NEGATIVE 04/03/2014 1509   LEUKOCYTESUR NEGATIVE 04/03/2014 1509     Recent Labs Lab 04/24/14 2121 04/24/14 2123 04/24/14 2308 04/25/14 0608 04/25/14 0738  GLUCAP 502* 502* 424* 224* 205*      Imaging Studies:  No results found.  Assessment/Plan: 49 y.o.  Stage III Hodgkin's lymphoma  He was admitted on 4/11 for cycle 3 of salvage chemotherapy with ICE He tolerated cycle 2 well without any side effects.  The patient will receive Neulasta injection as outpatient  Tumor Lysis Prophylaxis Patient is at risk of tumor lysis Last LDH on 3/28 was 499 with Uric Acid on3/28 at 3.7 Potassium and Calcium are unremarkable. Will continue allopurinol as outpatient for tumor lysis  Anemia in neoplastic disease This is likely due to recent chemotherapy No transfusion is indicated at this time Monitor counts closely as outpatient  Leukocytosis Due to recent Neulasta Will continue to monitor. No intervention is indicated at this time  DVT prophylaxis He is on  Lovenox prophylaxis  Diabetes mellitus Patient had a hyperglycemic event requiring Insulin without further complications Continue to monitor  Hypothyroidism On Synthroid  Full Code  **Disclaimer: This note was dictated with voice recognition software. Similar sounding words can inadvertently be transcribed and this note may contain transcription errors which may not have been corrected  upon publication of note.** [name], PA-C 04/25/2014  7:58 AM  ADDENDUM: Hematology/Oncology Attending: The patient is seen and examined. I agree with the above note. He is day 2 of the third cycle of salvage systemic chemotherapy for relapsed Hodgkin's disease. He tolerated the first day of his treatment fairly well was no significant adverse effects. We will continue with day #2 as scheduled. The patient will continue on IV hydration and allopurinol for tumor lysis prophylaxis.

## 2014-04-25 NOTE — Progress Notes (Signed)
Patient had bedtime CBG of 502. This was checked twice.  Patient did report eating candy prior to this blood sugar check. MD on call was notified and 10 units of Novolog insulin was given. Metformin was discontinued and patient was placed on a moderate SSI with CBG's ACHS (no bedtime coverage).  Patient was checked again about 1.5 hrs later.  CBG had gone down to just 424.  MD again notified.  Was told to check patient again in am and notify MD if still this elevated.  Will continue to monitor patient overnight.[name] [name]

## 2014-04-26 LAB — COMPREHENSIVE METABOLIC PANEL
ALT: 41 U/L (ref 0–53)
AST: 33 U/L (ref 0–37)
Albumin: 3.9 g/dL (ref 3.5–5.2)
Alkaline Phosphatase: 69 U/L (ref 39–117)
Anion gap: 12 (ref 5–15)
BUN: 18 mg/dL (ref 6–23)
CALCIUM: 9.1 mg/dL (ref 8.4–10.5)
CO2: 24 mmol/L (ref 19–32)
Chloride: 98 mmol/L (ref 96–112)
Creatinine, Ser: 0.97 mg/dL (ref 0.50–1.35)
GFR calc Af Amer: >90 mL/min (ref >90)
GLUCOSE: 246 mg/dL — AB (ref 70–99)
Potassium: 4.1 mmol/L (ref 3.5–5.1)
Sodium: 134 mmol/L — ABNORMAL LOW (ref 135–145)
Total Bilirubin: 0.9 mg/dL (ref 0.3–1.2)
Total Protein: 7.9 g/dL (ref 6.0–8.3)

## 2014-04-26 LAB — CBC WITH DIFFERENTIAL/PLATELET
BASOS PCT: 0 % (ref 0–1)
Basophils Absolute: 0 10*3/uL (ref 0.0–0.1)
EOS ABS: 0 10*3/uL (ref 0.0–0.7)
EOS PCT: 0 % (ref 0–5)
HEMATOCRIT: 27.2 % — AB (ref 39.0–52.0)
HEMOGLOBIN: 9.5 g/dL — AB (ref 13.0–17.0)
LYMPHS ABS: 0.9 10*3/uL (ref 0.7–4.0)
Lymphocytes Relative: 8 % — ABNORMAL LOW (ref 12–46)
MCH: 33 pg (ref 26.0–34.0)
MCHC: 34.9 g/dL (ref 30.0–36.0)
MCV: 94.4 fL (ref 78.0–100.0)
MONO ABS: 0.5 10*3/uL (ref 0.1–1.0)
MONOS PCT: 5 % (ref 3–12)
Neutro Abs: 9.1 10*3/uL — ABNORMAL HIGH (ref 1.7–7.7)
Neutrophils Relative %: 87 % — ABNORMAL HIGH (ref 43–77)
Platelets: 299 10*3/uL (ref 150–400)
RBC: 2.88 MIL/uL — AB (ref 4.22–5.81)
RDW: 15 % (ref 11.5–15.5)
WBC: 10.6 10*3/uL — ABNORMAL HIGH (ref 4.0–10.5)

## 2014-04-26 LAB — GLUCOSE, CAPILLARY
GLUCOSE-CAPILLARY: 193 mg/dL — AB (ref 70–99)
GLUCOSE-CAPILLARY: 222 mg/dL — AB (ref 70–99)
GLUCOSE-CAPILLARY: 373 mg/dL — AB (ref 70–99)
Glucose-Capillary: 234 mg/dL — ABNORMAL HIGH (ref 70–99)

## 2014-04-26 MED ORDER — INSULIN ASPART 100 UNIT/ML ~~LOC~~ SOLN
15.0000 [IU] | Freq: Once | SUBCUTANEOUS | Status: AC
  Administered 2014-04-26: 15 [IU] via SUBCUTANEOUS

## 2014-04-26 NOTE — Progress Notes (Signed)
[name] [name]   DOB:1965-02-24   DT#:[id number]   DXI#:[id number]  Patient Care Team: Pcp Not In System as PCP - General Jacelyn Pi, MD as Consulting Physician (Endocrinology)  Subjective: Patient seen and examined. Tolerated day 2 chemotherapy. Denies fevers, chills, night sweats, vision changes, or mucositis. Had one hyperglycemic event last night controlled with ISS. No further events. Denies any respiratory complaints. Denies any chest pain or palpitations. Denies lower extremity swelling. Denies nausea, heartburn or change in bowel habits. Last bowel movement on 4/12  Appetite is normal. Denies any dysuria. Denies abnormal skin rashes, or neuropathy. Denies any bleeding issues such as epistaxis, hematemesis, hematuria or hematochezia. Ambulating without difficulty.   Scheduled Meds: . allopurinol  300 mg Oral Daily  . azelastine  1 spray Each Nare BID   And  . fluticasone  1 spray Each Nare BID  . carvedilol  6.25 mg Oral BID  . cephALEXin  500 mg Oral TID  . enoxaparin (LOVENOX) injection  40 mg Subcutaneous Q24H  . etoposide  100 mg/m2 (Treatment Plan Actual) Intravenous Q24H  . fenofibrate  54 mg Oral Daily  . ifosfamide/mesna (IFEX/MESNEX) CHEMO for Inpatient CI in 1000 ml   Intravenous Once  . insulin aspart  0-15 Units Subcutaneous TID WC  . levothyroxine  200 mcg Oral QAC breakfast  . lisinopril  10 mg Oral Q breakfast  . loratadine  10 mg Oral Daily  . montelukast  10 mg Oral QHS  . ondansetron (ZOFRAN) with dexamethasone (DECADRON) IV   Intravenous Q24H  . potassium chloride  10 mEq Oral Daily  . pravastatin  40 mg Oral QHS  . sertraline  100 mg Oral Q breakfast  . zolpidem  5 mg Oral QHS   Continuous Infusions: . sodium chloride 50 mL/hr at 04/26/14 0620  . sodium chloride 20 mL/hr at 04/24/14 1215   PRN Meds:oxyCODONE, prochlorperazine, sodium chloride, sodium chloride   Objective:  Filed Vitals:   04/26/14 0535  BP: 117/84  Pulse: 75  Temp: 97.4 F (36.3  C)  Resp: 18      Intake/Output Summary (Last 24 hours) at 04/26/14 0729 Last data filed at 04/26/14 0536  Gross per 24 hour  Intake   2526 ml  Output   3675 ml  Net  -1149 ml    ECOG PERFORMANCE STATUS:1  GENERAL:alert, no distress and comfortable SKIN: skin color, texture, turgor are normal, no rashes or significant lesions EYES: normal, conjunctiva are pink and non-injected, sclera clear OROPHARYNX:no exudate, no erythema and lips, buccal mucosa, and tongue normal  NECK: supple, thyroid normal size, non-tender, without nodularity LYMPH:  no palpable lymphadenopathy in the cervical, axillary or inguinal LUNGS: clear to auscultation and percussion with normal breathing effort HEART: regular rate & rhythm and no murmurs and no lower extremity edema ABDOMEN: soft, non-tender and normal bowel sounds Musculoskeletal:no cyanosis of digits and no clubbing  PSYCH: alert & oriented x 3 with fluent speech NEURO: no focal motor/sensory deficits    CBG (last 3)   Recent Labs  04/25/14 1218 04/25/14 1659 04/25/14 2139  GLUCAP 205* 313* 335*     Labs:   Recent Labs Lab 04/21/14 0915 04/24/14 1220 04/25/14 0610 04/26/14 0402  WBC 16.1* 12.4* 15.7* 10.6*  HGB 10.6* 8.7* 9.9* 9.5*  HCT 30.7* 25.5* 29.0* 27.2*  PLT 239 289 298 299  MCV 92.7 94.8 93.5 94.4  MCH 32.0 32.3 31.9 33.0  MCHC 34.5 34.1 34.1 34.9  RDW 14.8* 15.0 15.0 15.0  LYMPHSABS 2.8  --  1.2 0.9  MONOABS 1.4*  --  0.4 0.5  EOSABS 0.0  --  0.0 0.0  BASOSABS 0.1  --  0.0 0.0     Chemistries:    Recent Labs Lab 04/21/14 0915 04/24/14 1220 04/25/14 0610 04/26/14 0402  NA 137  --  134* 134*  K 4.8  --  4.0 4.1  CL  --   --  100 98  CO2 26  --  25 24  GLUCOSE 297*  --  227* 246*  BUN 11.9  --  17 18  CREATININE 1.2 0.93 0.97 0.97  CALCIUM 9.2  --  9.4 9.1  AST 33  --  32 33  ALT 50  --  44 41  ALKPHOS 125  --  81 69  BILITOT 0.46  --  0.9 0.9    GFR Estimated Creatinine Clearance: 110.5  mL/min (by C-G formula based on Cr of 0.97).  Liver Function Tests:  Recent Labs Lab 04/21/14 0915 04/25/14 0610 04/26/14 0402  AST 33 32 33  ALT 50 44 41  ALKPHOS 125 81 69  BILITOT 0.46 0.9 0.9  PROT 7.7 8.1 7.9  ALBUMIN 3.8 4.0 3.9    Urine Studies     Component Value Date/Time   COLORURINE YELLOW 04/03/2014 1509   APPEARANCEUR CLEAR 04/03/2014 1509   LABSPEC 1.023 04/03/2014 1509   PHURINE 5.0 04/03/2014 1509   GLUCOSEU 100* 04/03/2014 1509   HGBUR MODERATE* 04/03/2014 1509   BILIRUBINUR NEGATIVE 04/03/2014 1509   KETONESUR NEGATIVE 04/03/2014 1509   PROTEINUR NEGATIVE 04/03/2014 1509   UROBILINOGEN 0.2 04/03/2014 1509   NITRITE NEGATIVE 04/03/2014 1509   LEUKOCYTESUR NEGATIVE 04/03/2014 1509     Recent Labs Lab 04/25/14 0608 04/25/14 0738 04/25/14 1218 04/25/14 1659 04/25/14 2139  GLUCAP 224* 205* 205* 313* 335*      Imaging Studies:  No results found.  Assessment/Plan: 49 y.o.  Stage III Hodgkin's lymphoma  He was admitted on 4/11 for cycle 3 of salvage chemotherapy with ICE He tolerated day 2 chemo without any side effects. Will proceed with day 3 The patient will receive Neulasta injection as outpatient  Tumor Lysis Prophylaxis Patient is at risk of tumor lysis Last LDH on 4/4 was 499 with Uric Acid on4/4 at 3.7 Potassium and Calcium are unremarkable. Will continue allopurinol as outpatient for tumor lysis  Anemia in neoplastic disease This is likely due to recent chemotherapy No transfusion is indicated at this time Monitor counts closely as outpatient  Leukocytosis Due to recent Neulasta This is trending down to normal values Will continue to monitor. No intervention is indicated at this time  DVT prophylaxis He is on  Lovenox prophylaxis  Diabetes mellitus Patient had a hyperglycemic event on 4/12 requiring Insulin without further complications or recurence Continue to monitor  Hypothyroidism On Synthroid  Full  Code  **Disclaimer: This note was dictated with voice recognition software. Similar sounding words can inadvertently be transcribed and this note may contain transcription errors which may not have been corrected upon publication of note.** [name], PA-C 04/26/2014  7:29 AM  ADDENDUM: Hematology/Oncology Attending: The patient is seen and examined today. I agree with the above note. He is day 3 of the third cycle of salvage chemotherapy with ICE. He tolerated the last 2 days of his treatment fairly well with no significant adverse effects. We will proceed with the treatment today as scheduled. The patient is expected to be discharged from the hospital tomorrow  with Neulasta injection on Friday. He will continue IV hydration and allopurinol for tumor lysis prophylaxis.

## 2014-04-26 NOTE — Progress Notes (Signed)
Chemo dosages and dilutions verified with Adline Peals, RN

## 2014-04-27 ENCOUNTER — Other Ambulatory Visit: Payer: Self-pay | Admitting: Internal Medicine

## 2014-04-27 ENCOUNTER — Telehealth: Payer: Self-pay | Admitting: Internal Medicine

## 2014-04-27 DIAGNOSIS — C8190 Hodgkin lymphoma, unspecified, unspecified site: Secondary | ICD-10-CM

## 2014-04-27 DIAGNOSIS — D72829 Elevated white blood cell count, unspecified: Secondary | ICD-10-CM

## 2014-04-27 LAB — CBC WITH DIFFERENTIAL/PLATELET
BASOS ABS: 0 10*3/uL (ref 0.0–0.1)
Basophils Relative: 0 % (ref 0–1)
Eosinophils Absolute: 0 10*3/uL (ref 0.0–0.7)
Eosinophils Relative: 0 % (ref 0–5)
HCT: 25.8 % — ABNORMAL LOW (ref 39.0–52.0)
Hemoglobin: 8.9 g/dL — ABNORMAL LOW (ref 13.0–17.0)
Lymphocytes Relative: 22 % (ref 12–46)
Lymphs Abs: 1.1 10*3/uL (ref 0.7–4.0)
MCH: 32.2 pg (ref 26.0–34.0)
MCHC: 34.5 g/dL (ref 30.0–36.0)
MCV: 93.5 fL (ref 78.0–100.0)
Monocytes Absolute: 0.5 10*3/uL (ref 0.1–1.0)
Monocytes Relative: 10 % (ref 3–12)
NEUTROS PCT: 68 % (ref 43–77)
Neutro Abs: 3.5 10*3/uL (ref 1.7–7.7)
PLATELETS: 231 10*3/uL (ref 150–400)
RBC: 2.76 MIL/uL — ABNORMAL LOW (ref 4.22–5.81)
RDW: 15.2 % (ref 11.5–15.5)
WBC: 5.1 10*3/uL (ref 4.0–10.5)

## 2014-04-27 LAB — COMPREHENSIVE METABOLIC PANEL
ALT: 37 U/L (ref 0–53)
ANION GAP: 6 (ref 5–15)
AST: 27 U/L (ref 0–37)
Albumin: 3.7 g/dL (ref 3.5–5.2)
Alkaline Phosphatase: 62 U/L (ref 39–117)
BUN: 18 mg/dL (ref 6–23)
CO2: 26 mmol/L (ref 19–32)
CREATININE: 0.95 mg/dL (ref 0.50–1.35)
Calcium: 9.1 mg/dL (ref 8.4–10.5)
Chloride: 101 mmol/L (ref 96–112)
GFR calc non Af Amer: >90 mL/min (ref >90)
Glucose, Bld: 153 mg/dL — ABNORMAL HIGH (ref 70–99)
Potassium: 3.9 mmol/L (ref 3.5–5.1)
SODIUM: 133 mmol/L — AB (ref 135–145)
Total Bilirubin: 0.5 mg/dL (ref 0.3–1.2)
Total Protein: 7.2 g/dL (ref 6.0–8.3)

## 2014-04-27 LAB — GLUCOSE, CAPILLARY: Glucose-Capillary: 121 mg/dL — ABNORMAL HIGH (ref 70–99)

## 2014-04-27 MED ORDER — HEPARIN SOD (PORK) LOCK FLUSH 100 UNIT/ML IV SOLN
500.0000 [IU] | INTRAVENOUS | Status: DC | PRN
  Filled 2014-04-27: qty 5

## 2014-04-27 NOTE — Telephone Encounter (Signed)
Confirm appointment for 04/15 inj.

## 2014-04-27 NOTE — Progress Notes (Signed)
cbg 373 at hs. Paged MD on call. Order for 15 units of novolog x1 obtained.

## 2014-04-27 NOTE — Progress Notes (Signed)
Nursing Discharge Summary  Patient ID: [name] [name] MRN: [id number] DOB/AGE: Dec 11, 1965 49 y.o.  Admit date: 04/24/2014 Discharge date: 04/27/2014  Discharged Condition: good  Disposition: 01-Home or Self Care    Prescriptions Given: No prescriptions given. Follow up appointments and medications reviewed. Patient verbalized understanding without further questions.  Means of Discharge: Patient to ambulate downstairs to be discharged home.   Signed: Buel Ream 04/27/2014, 11:40 AM

## 2014-04-27 NOTE — Discharge Summary (Signed)
Patient ID: MARQUS MACPHEE MRN: [id number] DOB/AGE: January 25, 1965 49 y.o.  Admit date: 04/24/2014 Discharge date: 04/27/2014  Patient Care Team: Pcp Not In System as PCP - General Jacelyn Pi, MD as Consulting Physician (Endocrinology)  Brief History of Present Illness: For complete details please refer to admission H and P, but in brief, Mr Yeargan is a 49 year old man with a history of Stage III Hodgkin's Lymphoma admitted on 4/11 for cycle 3 of salvage chemotherapy with ICE   Discharge Diagnoses/Hospital Course:  Stage III Hodgkin's lymphoma  He was admitted on 4/11 for cycle 3 of salvage chemotherapy with ICE He tolerated chemo without any side effects.   The patient will receive Neulasta injection as outpatient on 04/28/14  Tumor Lysis Prophylaxis Patient is at risk of tumor lysis Last LDH on 4/4 was 499 with Uric Acid on4/4 at 3.7 Potassium and Calcium are unremarkable. Will continue allopurinol as outpatient for tumor lysis  Anemia in neoplastic disease This is likely due to recent chemotherapy No transfusion is indicated at this time Monitor counts closely as outpatient  Leukocytosis Due to recent Neulasta This is trending down to normal values Will continue to monitor. No intervention is indicated at this time  DVT prophylaxis He was on  Lovenox prophylaxis during hospitalization  Diabetes mellitus Patient had a hyperglycemic event on 4/12 requiring Insulin without further complications or recurrence Continue to monitor  Hypothyroidism On Synthroid  Full Code Procedures: S/P Chemotherapy with ICE from 4/11-4/13/2016  Consultations: None   Past Medical History  Diagnosis Date  . Hypertension   . Diabetes mellitus without complication     Pre-diabetes  . Depression   . Headache(784.0)   . Thyroid disease     Hx: of  . Cancer 2015    cervical lymphadenopathy  . Hodgkin lymphoma 2015  . Hepatic artery aneurysm 2015    Discharge  Medications:    Medication List    STOP taking these medications        PRESCRIPTION MEDICATION      TAKE these medications        allopurinol 100 MG tablet  Commonly known as:  ZYLOPRIM  Take 1 tablet (100 mg total) by mouth 2 (two) times daily.     Azelastine-Fluticasone 137-50 MCG/ACT Susp  Place 1 spray into the nose 2 (two) times daily.     carvedilol 6.25 MG tablet  Commonly known as:  COREG  TAKE ONE TABLET BY MOUTH TWICE DAILY     cephALEXin 500 MG capsule  Commonly known as:  KEFLEX  Take 500 mg by mouth 3 (three) times daily.     eszopiclone 2 MG Tabs tablet  Commonly known as:  LUNESTA  Take 2 mg by mouth at bedtime as needed for sleep. Take immediately before bedtime     fenofibrate 145 MG tablet  Commonly known as:  TRICOR  TAKE ONE TABLET BY MOUTH ONCE DAILY     fexofenadine-pseudoephedrine 180-240 MG per 24 hr tablet  Commonly known as:  ALLEGRA-D 24  Take 1 tablet by mouth daily.     levothyroxine 200 MCG tablet  Commonly known as:  SYNTHROID, LEVOTHROID  Take 200 mcg by mouth daily before breakfast.     lisinopril 10 MG tablet  Commonly known as:  PRINIVIL,ZESTRIL  Take 10 mg by mouth daily with breakfast.     lovastatin 40 MG 24 hr tablet  Commonly known as:  ALTOPREV  Take 40 mg by mouth at bedtime.  metFORMIN 500 MG tablet  Commonly known as:  GLUCOPHAGE  Take 1,000 mg by mouth 2 (two) times daily.     montelukast 10 MG tablet  Commonly known as:  SINGULAIR  Take 10 mg by mouth at bedtime.     OVER THE COUNTER MEDICATION  - Take 2.5 mLs by mouth daily. *Isotonix Antioxidant Powder*   - Mixes with water     oxyCODONE 5 MG immediate release tablet  Commonly known as:  Oxy IR/ROXICODONE  Take 1 tablet (5 mg total) by mouth every 6 (six) hours as needed for severe pain.     potassium chloride 10 MEQ tablet  Commonly known as:  K-DUR,KLOR-CON  Take 10 mEq by mouth daily.     prochlorperazine 10 MG tablet  Commonly known as:   COMPAZINE  Take 1 tablet (10 mg total) by mouth every 6 (six) hours as needed for nausea or vomiting.     sertraline 100 MG tablet  Commonly known as:  ZOLOFT  Take 100 mg by mouth daily with breakfast.        Discharge Condition: Improved.  Diet recommendation: Carbohydrate-modified.  Disposition and Follow-up: patient will return for Neulasta on 04/28/14 at the [facility].                                                 Office to call for follow up appointment    ECOG PERFORMANCE STATUS: 0  Physical Exam at Discharge:  Subjective:  Patient seen and examined. Tolerated cycle 3 chemotherapy without any complications. Denies fevers, chills, night sweats, vision changes, or mucositis. Had one hyperglycemic event last night controlled with ISS. No further events. Denies any respiratory complaints. Denies any chest pain or palpitations. Denies lower extremity swelling. Denies nausea, heartburn or change in bowel habits. Last bowel movement on 4/12 Appetite is normal. Denies any dysuria. Denies abnormal skin rashes, or neuropathy. Denies any bleeding issues such as epistaxis, hematemesis, hematuria or hematochezia. Ambulating without difficulty.  Objective:  BP 110/70 mmHg  Pulse 60  Temp(Src) 97.5 F (36.4 C) (Oral)  Resp 16  Ht 5\' 10"  (1.778 m)  Wt 220 lb (99.791 kg)  BMI 31.57 kg/m2  SpO2 100%  GENERAL:alert, no distress and comfortable SKIN: skin color, texture, turgor are normal, no rashes or significant lesions EYES: normal, conjunctiva are pink and non-injected, sclera clear OROPHARYNX:no exudate, no erythema and lips, buccal mucosa, and tongue normal  NECK: supple, thyroid normal size, non-tender, without nodularity LYMPH:  no palpable lymphadenopathy in the cervical, axillary or inguinal area LUNGS: clear to auscultation and percussion with normal breathing effort HEART: regular rate & rhythm and no murmurs and no lower extremity edema ABDOMEN:abdomen soft,  non-tender and normal bowel sounds Musculoskeletal:no cyanosis of digits and no clubbing  PSYCH: alert & oriented x 3 with fluent speech NEURO: no focal motor/sensory deficits    Significant Diagnostic Studies:  None during this admission  Discharge Laboratory Values:  CBC  Recent Labs Lab 04/21/14 0915 04/24/14 1220 04/25/14 0610 04/26/14 0402 04/27/14 0620  WBC 16.1* 12.4* 15.7* 10.6* 5.1  HGB 10.6* 8.7* 9.9* 9.5* 8.9*  HCT 30.7* 25.5* 29.0* 27.2* 25.8*  PLT 239 289 298 299 231  MCV 92.7 94.8 93.5 94.4 93.5  MCH 32.0 32.3 31.9 33.0 32.2  MCHC 34.5 34.1 34.1 34.9 34.5  RDW 14.8* 15.0 15.0  15.0 15.2  LYMPHSABS 2.8  --  1.2 0.9 1.1  MONOABS 1.4*  --  0.4 0.5 0.5  EOSABS 0.0  --  0.0 0.0 0.0  BASOSABS 0.1  --  0.0 0.0 0.0    Chemistries   Recent Labs Lab 04/21/14 0915 04/24/14 1220 04/25/14 0610 04/26/14 0402 04/27/14 0620  NA 137  --  134* 134* 133*  K 4.8  --  4.0 4.1 3.9  CL  --   --  100 98 101  CO2 26  --  25 24 26   GLUCOSE 297*  --  227* 246* 153*  BUN 11.9  --  17 18 18   CREATININE 1.2 0.93 0.97 0.97 0.95  CALCIUM 9.2  --  9.4 9.1 9.1   Signed: Sharene Butters, PA-C 04/27/2014, 8:05 AM  ADDENDUM: Hematology/Oncology Attending: The patient is seen and examined today. I agree with the above note. He completed cycle #3 of his salvage chemotherapy with ICE for relapsed Hodgkin disease. The patient is feeling fine today with no specific complaints. We will discharge him from the hospital today with follow-up visit in 2 weeks for reevaluation before admission for the fourth cycle. The patient will have Neulasta injection tomorrow at the [facility]. He will continue his treatment with allopurinol for tumor lysis prophylaxis. The patient was advised to call immediately if he has any concerning symptoms.

## 2014-04-28 ENCOUNTER — Ambulatory Visit (HOSPITAL_BASED_OUTPATIENT_CLINIC_OR_DEPARTMENT_OTHER): Payer: Commercial Managed Care - PPO

## 2014-04-28 VITALS — BP 106/76 | HR 107

## 2014-04-28 DIAGNOSIS — C8120 Mixed cellularity classical Hodgkin lymphoma, unspecified site: Secondary | ICD-10-CM | POA: Diagnosis not present

## 2014-04-28 DIAGNOSIS — Z5189 Encounter for other specified aftercare: Secondary | ICD-10-CM | POA: Diagnosis not present

## 2014-04-28 MED ORDER — PEGFILGRASTIM INJECTION 6 MG/0.6ML ~~LOC~~
6.0000 mg | PREFILLED_SYRINGE | Freq: Once | SUBCUTANEOUS | Status: AC
  Administered 2014-04-28: 6 mg via SUBCUTANEOUS
  Filled 2014-04-28: qty 0.6

## 2014-04-28 NOTE — Progress Notes (Signed)
Discharge summary sent to payer through [facility]  

## 2014-04-28 NOTE — Patient Instructions (Signed)
Pegfilgrastim injection What is this medicine? PEGFILGRASTIM (peg fil GRA stim) is a long-acting granulocyte colony-stimulating factor that stimulates the growth of neutrophils, a type of white blood cell important in the body's fight against infection. It is used to reduce the incidence of fever and infection in patients with certain types of cancer who are receiving chemotherapy that affects the bone marrow. This medicine may be used for other purposes; ask your health care provider or pharmacist if you have questions. COMMON BRAND NAME(S): Neulasta What should I tell my health care provider before I take this medicine? They need to know if you have any of these conditions: -latex allergy -ongoing radiation therapy -sickle cell disease -skin reactions to acrylic adhesives (On-Body Injector only) -an unusual or allergic reaction to pegfilgrastim, filgrastim, other medicines, foods, dyes, or preservatives -pregnant or trying to get pregnant -breast-feeding How should I use this medicine? This medicine is for injection under the skin. If you get this medicine at home, you will be taught how to prepare and give the pre-filled syringe or how to use the On-body Injector. Refer to the patient Instructions for Use for detailed instructions. Use exactly as directed. Take your medicine at regular intervals. Do not take your medicine more often than directed. It is important that you put your used needles and syringes in a special sharps container. Do not put them in a trash can. If you do not have a sharps container, call your pharmacist or healthcare provider to get one. Talk to your pediatrician regarding the use of this medicine in children. Special care may be needed. Overdosage: If you think you have taken too much of this medicine contact a poison control center or emergency room at once. NOTE: This medicine is only for you. Do not share this medicine with others. What if I miss a dose? It is  important not to miss your dose. Call your doctor or health care professional if you miss your dose. If you miss a dose due to an On-body Injector failure or leakage, a new dose should be administered as soon as possible using a single prefilled syringe for manual use. What may interact with this medicine? Interactions have not been studied. Give your health care provider a list of all the medicines, herbs, non-prescription drugs, or dietary supplements you use. Also tell them if you smoke, drink alcohol, or use illegal drugs. Some items may interact with your medicine. This list may not describe all possible interactions. Give your health care provider a list of all the medicines, herbs, non-prescription drugs, or dietary supplements you use. Also tell them if you smoke, drink alcohol, or use illegal drugs. Some items may interact with your medicine. What should I watch for while using this medicine? You may need blood work done while you are taking this medicine. If you are going to need a MRI, CT scan, or other procedure, tell your doctor that you are using this medicine (On-Body Injector only). What side effects may I notice from receiving this medicine? Side effects that you should report to your doctor or health care professional as soon as possible: -allergic reactions like skin rash, itching or hives, swelling of the face, lips, or tongue -dizziness -fever -pain, redness, or irritation at site where injected -pinpoint red spots on the skin -shortness of breath or breathing problems -stomach or side pain, or pain at the shoulder -swelling -tiredness -trouble passing urine Side effects that usually do not require medical attention (report to your doctor   or health care professional if they continue or are bothersome): -bone pain -muscle pain This list may not describe all possible side effects. Call your doctor for medical advice about side effects. You may report side effects to FDA at  1-800-FDA-1088. Where should I keep my medicine? Keep out of the reach of children. Store pre-filled syringes in a refrigerator between 2 and 8 degrees C (36 and 46 degrees F). Do not freeze. Keep in carton to protect from light. Throw away this medicine if it is left out of the refrigerator for more than 48 hours. Throw away any unused medicine after the expiration date. NOTE: This sheet is a summary. It may not cover all possible information. If you have questions about this medicine, talk to your doctor, pharmacist, or health care provider.  2015, [employer]. (2013-03-31 16:14:05)  

## 2014-05-01 ENCOUNTER — Other Ambulatory Visit (HOSPITAL_BASED_OUTPATIENT_CLINIC_OR_DEPARTMENT_OTHER): Payer: Commercial Managed Care - PPO

## 2014-05-01 ENCOUNTER — Ambulatory Visit: Payer: Commercial Managed Care - PPO

## 2014-05-01 DIAGNOSIS — C8120 Mixed cellularity classical Hodgkin lymphoma, unspecified site: Secondary | ICD-10-CM | POA: Diagnosis not present

## 2014-05-01 DIAGNOSIS — Z95828 Presence of other vascular implants and grafts: Secondary | ICD-10-CM

## 2014-05-01 LAB — COMPREHENSIVE METABOLIC PANEL (CC13)
ALT: 36 U/L (ref 0–55)
AST: 24 U/L (ref 5–34)
Albumin: 4.2 g/dL (ref 3.5–5.0)
Alkaline Phosphatase: 130 U/L (ref 40–150)
Anion Gap: 16 mEq/L — ABNORMAL HIGH (ref 3–11)
BUN: 20.3 mg/dL (ref 7.0–26.0)
CHLORIDE: 100 meq/L (ref 98–109)
CO2: 17 mEq/L — ABNORMAL LOW (ref 22–29)
CREATININE: 1.2 mg/dL (ref 0.7–1.3)
Calcium: 9.6 mg/dL (ref 8.4–10.4)
EGFR: 70 mL/min/{1.73_m2} — ABNORMAL LOW (ref >90)
Glucose: 252 mg/dl — ABNORMAL HIGH (ref 70–140)
Potassium: 4.4 mEq/L (ref 3.5–5.1)
Sodium: 133 mEq/L — ABNORMAL LOW (ref 136–145)
Total Bilirubin: 0.44 mg/dL (ref 0.20–1.20)
Total Protein: 8.1 g/dL (ref 6.4–8.3)

## 2014-05-01 MED ORDER — HEPARIN SOD (PORK) LOCK FLUSH 100 UNIT/ML IV SOLN
500.0000 [IU] | Freq: Once | INTRAVENOUS | Status: DC
  Filled 2014-05-01: qty 5

## 2014-05-01 MED ORDER — SODIUM CHLORIDE 0.9 % IJ SOLN
10.0000 mL | INTRAMUSCULAR | Status: DC | PRN
  Filled 2014-05-01: qty 10

## 2014-05-01 NOTE — Progress Notes (Signed)
Patient in for Port-A-Cath access and labs today. Patient states, "I don't need a flush because I just had my port flushed last week. I just need my labs drawn." All labs drawn by C.L. Hickerson, Sport and exercise psychologist.

## 2014-05-01 NOTE — Patient Instructions (Signed)

## 2014-05-08 ENCOUNTER — Other Ambulatory Visit: Payer: Commercial Managed Care - PPO

## 2014-05-11 ENCOUNTER — Encounter: Payer: Self-pay | Admitting: Internal Medicine

## 2014-05-11 ENCOUNTER — Ambulatory Visit: Payer: Commercial Managed Care - PPO | Admitting: Physician Assistant

## 2014-05-12 ENCOUNTER — Other Ambulatory Visit: Payer: Self-pay | Admitting: Medical Oncology

## 2014-05-12 ENCOUNTER — Telehealth: Payer: Self-pay | Admitting: Medical Oncology

## 2014-05-12 ENCOUNTER — Telehealth: Payer: Self-pay | Admitting: Internal Medicine

## 2014-05-12 ENCOUNTER — Encounter: Payer: Self-pay | Admitting: Physician Assistant

## 2014-05-12 ENCOUNTER — Other Ambulatory Visit (HOSPITAL_BASED_OUTPATIENT_CLINIC_OR_DEPARTMENT_OTHER): Payer: Commercial Managed Care - PPO

## 2014-05-12 ENCOUNTER — Ambulatory Visit (HOSPITAL_BASED_OUTPATIENT_CLINIC_OR_DEPARTMENT_OTHER): Payer: Commercial Managed Care - PPO | Admitting: Physician Assistant

## 2014-05-12 VITALS — BP 122/82 | HR 102 | Temp 97.8°F | Resp 18 | Ht 70.0 in | Wt 222.0 lb

## 2014-05-12 DIAGNOSIS — C8120 Mixed cellularity classical Hodgkin lymphoma, unspecified site: Secondary | ICD-10-CM | POA: Diagnosis not present

## 2014-05-12 LAB — CBC WITH DIFFERENTIAL/PLATELET
BASO%: 0.9 % (ref 0.0–2.0)
Basophils Absolute: 0.1 10*3/uL (ref 0.0–0.1)
EOS%: 0.3 % (ref 0.0–7.0)
Eosinophils Absolute: 0 10*3/uL (ref 0.0–0.5)
HCT: 28.4 % — ABNORMAL LOW (ref 38.4–49.9)
HGB: 9.5 g/dL — ABNORMAL LOW (ref 13.0–17.1)
LYMPH%: 14.3 % (ref 14.0–49.0)
MCH: 32.3 pg (ref 27.2–33.4)
MCHC: 33.5 g/dL (ref 32.0–36.0)
MCV: 96.6 fL (ref 79.3–98.0)
MONO#: 1.4 10*3/uL — ABNORMAL HIGH (ref 0.1–0.9)
MONO%: 9.5 % (ref 0.0–14.0)
NEUT#: 10.8 10*3/uL — ABNORMAL HIGH (ref 1.5–6.5)
NEUT%: 75 % (ref 39.0–75.0)
Platelets: 270 10*3/uL (ref 140–400)
RBC: 2.94 10*6/uL — ABNORMAL LOW (ref 4.20–5.82)
RDW: 16.6 % — ABNORMAL HIGH (ref 11.0–14.6)
WBC: 14.4 10*3/uL — ABNORMAL HIGH (ref 4.0–10.3)
lymph#: 2.1 10*3/uL (ref 0.9–3.3)

## 2014-05-12 LAB — COMPREHENSIVE METABOLIC PANEL (CC13)
ALBUMIN: 4 g/dL (ref 3.5–5.0)
ALT: 59 U/L — AB (ref 0–55)
AST: 41 U/L — ABNORMAL HIGH (ref 5–34)
Alkaline Phosphatase: 120 U/L (ref 40–150)
Anion Gap: 13 mEq/L — ABNORMAL HIGH (ref 3–11)
BUN: 9.7 mg/dL (ref 7.0–26.0)
CHLORIDE: 101 meq/L (ref 98–109)
CO2: 22 mEq/L (ref 22–29)
CREATININE: 1 mg/dL (ref 0.7–1.3)
Calcium: 9.3 mg/dL (ref 8.4–10.4)
EGFR: 85 mL/min/{1.73_m2} — AB (ref >90)
Glucose: 193 mg/dl — ABNORMAL HIGH (ref 70–140)
Potassium: 4.5 mEq/L (ref 3.5–5.1)
Sodium: 136 mEq/L (ref 136–145)
Total Bilirubin: 0.46 mg/dL (ref 0.20–1.20)
Total Protein: 7.6 g/dL (ref 6.4–8.3)

## 2014-05-12 LAB — LACTATE DEHYDROGENASE (CC13): LDH: 332 U/L — ABNORMAL HIGH (ref 125–245)

## 2014-05-12 NOTE — Progress Notes (Addendum)
[place] [phone]   [phone]  OFFICE PROGRESS NOTE  Pcp Not In System No address on file  DIAGNOSIS: Recurrent Hodgkin's lymphoma initially diagnosed as Stage III classical Hodgkin's lymphoma, mixed cellularity subtype diagnosed in December 2014.  PRIOR THERAPY: Status post 6 cycles of systemic chemotherapy with ABVD complicated with pulmonary fibrosis, last dose was given 07/14/2013  CURRENT THERAPY: Salvage chemotherapy with ICE (ifosfamide/mesna, carboplatin and etoposide) first dose was given 03/06/2014. Status post 3 cycles.  INTERVAL HISTORY: [name] [name] 49 y.o. male returns to the clinic today for follow-up visit.  The patient is feeling fine today was no specific complaints. He tolerated the third cycle of his systemic chemotherapy fairly well with no significant adverse effects.  He denied having any significant weight loss or night sweats. The patient denied having any chest pain, shortness of breath, cough or hemoptysis. He has no nausea or vomiting. He reports that he is to see Dr. Evette Doffing on 05/31/2014 and if at all possible would like for him to have his restaging imaging completed prior to that visit.   MEDICAL HISTORY: Past Medical History  Diagnosis Date  . Hypertension   . Diabetes mellitus without complication     Pre-diabetes  . Depression   . Headache(784.0)   . Thyroid disease     Hx: of  . Cancer 2015    cervical lymphadenopathy  . Hodgkin lymphoma 2015  . Hepatic artery aneurysm 2015    ALLERGIES:  has No Known Allergies.  MEDICATIONS:  Current Outpatient Prescriptions  Medication Sig Dispense Refill  . allopurinol (ZYLOPRIM) 100 MG tablet Take 1 tablet (100 mg total) by mouth 2 (two) times daily. 60 tablet 3  . Azelastine-Fluticasone 137-50 MCG/ACT SUSP Place 1 spray into the nose 2 (two) times daily.    . carvedilol (COREG) 6.25 MG tablet TAKE ONE TABLET BY MOUTH TWICE DAILY 60 tablet 0  .  cephALEXin (KEFLEX) 500 MG capsule Take 500 mg by mouth 3 (three) times daily.    . eszopiclone (LUNESTA) 2 MG TABS tablet Take 2 mg by mouth at bedtime as needed for sleep. Take immediately before bedtime    . fenofibrate (TRICOR) 145 MG tablet TAKE ONE TABLET BY MOUTH ONCE DAILY 30 tablet 1  . fexofenadine-pseudoephedrine (ALLEGRA-D 24) 180-240 MG per 24 hr tablet Take 1 tablet by mouth daily.    Marland Kitchen levothyroxine (SYNTHROID, LEVOTHROID) 200 MCG tablet Take 200 mcg by mouth daily before breakfast.    . lisinopril (PRINIVIL,ZESTRIL) 10 MG tablet Take 10 mg by mouth daily with breakfast.     . lovastatin (ALTOPREV) 40 MG 24 hr tablet Take 40 mg by mouth at bedtime.    . metFORMIN (GLUCOPHAGE) 500 MG tablet Take 1,000 mg by mouth 2 (two) times daily.     . montelukast (SINGULAIR) 10 MG tablet Take 10 mg by mouth at bedtime.    Marland Kitchen OVER THE COUNTER MEDICATION Take 2.5 mLs by mouth daily. *Isotonix Antioxidant Powder*  Mixes with water    . oxyCODONE (OXY IR/ROXICODONE) 5 MG immediate release tablet Take 1 tablet (5 mg total) by mouth every 6 (six) hours as needed for severe pain. 60 tablet 0  . potassium chloride (K-DUR,KLOR-CON) 10 MEQ tablet Take 10 mEq by mouth daily.     . prochlorperazine (COMPAZINE) 10 MG tablet Take 1 tablet (10 mg total) by mouth every 6 (six) hours as needed for nausea or vomiting. (Patient taking differently: Take 10 mg  by mouth daily as needed for nausea or vomiting. ) 30 tablet 1  . sertraline (ZOLOFT) 100 MG tablet Take 100 mg by mouth daily with breakfast.      No current facility-administered medications for this visit.    SURGICAL HISTORY:  Past Surgical History  Procedure Laterality Date  . Tonsillectomy    . Mass excision Left 12/31/2012    Procedure: CERVICAL LYMPH NODE BIOPSY;  Surgeon: [name], MD;  Location: [place];  Service: General;  Laterality: Left;    REVIEW OF SYSTEMS:  Constitutional: negative Eyes: negative Ears, nose, mouth, throat, and  face: negative Respiratory: negative Cardiovascular: negative Gastrointestinal: negative Genitourinary:negative Integument/breast: negative Hematologic/lymphatic: negative Musculoskeletal:negative Neurological: negative Behavioral/Psych: negative Endocrine: negative Allergic/Immunologic: negative   PHYSICAL EXAMINATION: General appearance: alert, cooperative and no distress Head: Normocephalic, without obvious abnormality, atraumatic Neck: no JVD, supple, symmetrical, trachea midline, thyroid not enlarged, symmetric, no tenderness/mass/nodules and Palpable small left lower cervical lymph node, firm to palpation Lymph nodes: Palpable small left lower cervical lymph node, firm to palpation Resp: clear to auscultation bilaterally Back: symmetric, no curvature. ROM normal. No CVA tenderness. Cardio: regular rate and rhythm, S1, S2 normal, no murmur, click, rub or gallop GI: soft, non-tender; bowel sounds normal; no masses,  no organomegaly Extremities: extremities normal, atraumatic, no cyanosis or edema Neurologic: Alert and oriented X 3, normal strength and tone. Normal symmetric reflexes. Normal coordination and gait  ECOG PERFORMANCE STATUS: 0 - Asymptomatic  Blood pressure 122/82, pulse 102, temperature 97.8 F (36.6 C), temperature source Oral, resp. rate 18, height 5\' 10"  (1.778 m), weight 222 lb (100.699 kg), SpO2 98 %.  LABORATORY DATA: Lab Results  Component Value Date   WBC 14.4* 05/12/2014   HGB 9.5* 05/12/2014   HCT 28.4* 05/12/2014   MCV 96.6 05/12/2014   PLT 270 05/12/2014      Chemistry      Component Value Date/Time   NA 136 05/12/2014 1131   NA 133* 04/27/2014 0620   K 4.5 05/12/2014 1131   K 3.9 04/27/2014 0620   CL 101 04/27/2014 0620   CO2 22 05/12/2014 1131   CO2 26 04/27/2014 0620   BUN 9.7 05/12/2014 1131   BUN 18 04/27/2014 0620   CREATININE 1.0 05/12/2014 1131   CREATININE 0.95 04/27/2014 0620      Component Value Date/Time   CALCIUM 9.3  05/12/2014 1131   CALCIUM 9.1 04/27/2014 0620   ALKPHOS 120 05/12/2014 1131   ALKPHOS 62 04/27/2014 0620   AST 41* 05/12/2014 1131   AST 27 04/27/2014 0620   ALT 59* 05/12/2014 1131   ALT 37 04/27/2014 0620   BILITOT 0.46 05/12/2014 1131   BILITOT 0.5 04/27/2014 0620       RADIOGRAPHIC STUDIES: Ct Soft Tissue Neck W Contrast  04/20/2014   CLINICAL DATA:  Follow-up Hodgkin's lymphoma  EXAM: CT OF THE NECK WITH CONTRAST  CT OF THE CHEST WITH CONTRAST  CT OF THE ABDOMEN AND PELVIS WITH CONTRAST  TECHNIQUE: Multidetector CT imaging of the neck was performed with intravenous contrast.; Multidetector CT imaging of the abdomen and pelvis was performed following the standard protocol during bolus administration of intravenous contrast.; Multidetector CT imaging of the chest was performed following the standard protocol during bolus administration of intravenous contrast.  CONTRAST:  165mL OMNIPAQUE IOHEXOL 300 MG/ML  SOLN  COMPARISON:  PET-CT dated 01/26/2014. CT neck, chest, and abdomen/pelvis dated 01/17/2014.  FINDINGS: CT NECK FINDINGS  Pharynx and larynx: Within normal limits.  Salivary glands:  Within normal limits.  Thyroid: Within normal limits.  Lymph nodes: Mildly prominent left lower cervical nodes measuring up to 7 mm short axis (series 8/ image 69), previously 8 mm.  Left supraclavicular lymph nodes measuring up to 10 mm short axis (series 8/ image 91), previously 16 mm.  Vascular: Within normal limits.  Limited intracranial: Within normal limits.  Visualized orbits: Visualized globes and retroconal soft tissues are within normal limits.  Mastoids and visualized paranasal sinuses: Visualized paranasal sinuses and mastoid air cells are clear.  Skeleton: Mild degenerative changes at C5-6.  CT CHEST FINDINGS  Mediastinum/Nodes: The heart is normal in size. No pericardial effusion.  Three vessel coronary atherosclerosis.  Mild thoracic lymphadenopathy, including:  --17 mm short axis left axillary node  (Series 3/ image 11), previously 23 mm  --13 mm short axis right axillary node (series 3/ image 12), unchanged  --12 mm short axis low right paratracheal node (series 3/image 20), previously 19 mm  --8 mm short axis subcarinal node (series 3/ image 24), previously 18 mm  Lungs/Pleura: 3 mm left lower lobe nodule (series 6/ image 32), unchanged.  Mild centrilobular emphysematous changes.  No focal consolidation.  No pleural effusion or pneumothorax.  Musculoskeletal: Mild degenerative changes of the thoracic spine.  CT ABDOMEN PELVIS FINDINGS  Hepatobiliary: Liver is notable for hepatic steatosis with mild focal fatty sparing in the gallbladder fossa.  Gallbladder is unremarkable. No intrahepatic or extrahepatic ductal dilatation.  Pancreas: Within normal limits.  Spleen: Within normal limits.  Adrenals/Urinary Tract: Adrenal glands are within normal limits.  Kidneys are within normal limits.  No hydronephrosis.  Bladder is within normal limits.  Stomach/Bowel: Stomach is within normal limits.  No evidence of bowel obstruction.  Normal appendix.  Mild to moderate colonic stool burden.  Vascular/Lymphatic: No evidence of abdominal aortic aneurysm.  Stable 7.6 x 5.5 cm calcified mass in the porta hepatis, compatible with known calcified hepatic artery aneurysm, with adjacent embolization coils.  Mildly enlarged retroperitoneal nodes, including:  --17 mm short axis left para-aortic node (series 3/ image 85), previously 22 mm  --10 mm short axis right common iliac node (series 3/image 91), unchanged  Reproductive: Prostate is unremarkable.  Other: Mild degenerative changes of the lumbar spine.  Musculoskeletal: Scalloping along the posterior aspect of the L1 vertebral body (sagittal image 65), unchanged.  No focal osseous lesions.  IMPRESSION: Left supraclavicular, axillary, mediastinal, and para-aortic lymphadenopathy, mildly improved.   Electronically Signed   By: Julian Hy M.D.   On: 04/20/2014 10:16   Ct  Chest W Contrast  04/20/2014   CLINICAL DATA:  Follow-up Hodgkin's lymphoma  EXAM: CT OF THE NECK WITH CONTRAST  CT OF THE CHEST WITH CONTRAST  CT OF THE ABDOMEN AND PELVIS WITH CONTRAST  TECHNIQUE: Multidetector CT imaging of the neck was performed with intravenous contrast.; Multidetector CT imaging of the abdomen and pelvis was performed following the standard protocol during bolus administration of intravenous contrast.; Multidetector CT imaging of the chest was performed following the standard protocol during bolus administration of intravenous contrast.  CONTRAST:  115mL OMNIPAQUE IOHEXOL 300 MG/ML  SOLN  COMPARISON:  PET-CT dated 01/26/2014. CT neck, chest, and abdomen/pelvis dated 01/17/2014.  FINDINGS: CT NECK FINDINGS  Pharynx and larynx: Within normal limits.  Salivary glands: Within normal limits.  Thyroid: Within normal limits.  Lymph nodes: Mildly prominent left lower cervical nodes measuring up to 7 mm short axis (series 8/ image 69), previously 8 mm.  Left supraclavicular lymph nodes measuring up  to 10 mm short axis (series 8/ image 91), previously 16 mm.  Vascular: Within normal limits.  Limited intracranial: Within normal limits.  Visualized orbits: Visualized globes and retroconal soft tissues are within normal limits.  Mastoids and visualized paranasal sinuses: Visualized paranasal sinuses and mastoid air cells are clear.  Skeleton: Mild degenerative changes at C5-6.  CT CHEST FINDINGS  Mediastinum/Nodes: The heart is normal in size. No pericardial effusion.  Three vessel coronary atherosclerosis.  Mild thoracic lymphadenopathy, including:  --17 mm short axis left axillary node (Series 3/ image 11), previously 23 mm  --13 mm short axis right axillary node (series 3/ image 12), unchanged  --12 mm short axis low right paratracheal node (series 3/image 20), previously 19 mm  --8 mm short axis subcarinal node (series 3/ image 24), previously 18 mm  Lungs/Pleura: 3 mm left lower lobe nodule (series 6/  image 32), unchanged.  Mild centrilobular emphysematous changes.  No focal consolidation.  No pleural effusion or pneumothorax.  Musculoskeletal: Mild degenerative changes of the thoracic spine.  CT ABDOMEN PELVIS FINDINGS  Hepatobiliary: Liver is notable for hepatic steatosis with mild focal fatty sparing in the gallbladder fossa.  Gallbladder is unremarkable. No intrahepatic or extrahepatic ductal dilatation.  Pancreas: Within normal limits.  Spleen: Within normal limits.  Adrenals/Urinary Tract: Adrenal glands are within normal limits.  Kidneys are within normal limits.  No hydronephrosis.  Bladder is within normal limits.  Stomach/Bowel: Stomach is within normal limits.  No evidence of bowel obstruction.  Normal appendix.  Mild to moderate colonic stool burden.  Vascular/Lymphatic: No evidence of abdominal aortic aneurysm.  Stable 7.6 x 5.5 cm calcified mass in the porta hepatis, compatible with known calcified hepatic artery aneurysm, with adjacent embolization coils.  Mildly enlarged retroperitoneal nodes, including:  --17 mm short axis left para-aortic node (series 3/ image 85), previously 22 mm  --10 mm short axis right common iliac node (series 3/image 91), unchanged  Reproductive: Prostate is unremarkable.  Other: Mild degenerative changes of the lumbar spine.  Musculoskeletal: Scalloping along the posterior aspect of the L1 vertebral body (sagittal image 65), unchanged.  No focal osseous lesions.  IMPRESSION: Left supraclavicular, axillary, mediastinal, and para-aortic lymphadenopathy, mildly improved.   Electronically Signed   By: Julian Hy M.D.   On: 04/20/2014 10:16   Ct Abdomen Pelvis W Contrast  04/20/2014   CLINICAL DATA:  Follow-up Hodgkin's lymphoma  EXAM: CT OF THE NECK WITH CONTRAST  CT OF THE CHEST WITH CONTRAST  CT OF THE ABDOMEN AND PELVIS WITH CONTRAST  TECHNIQUE: Multidetector CT imaging of the neck was performed with intravenous contrast.; Multidetector CT imaging of the abdomen  and pelvis was performed following the standard protocol during bolus administration of intravenous contrast.; Multidetector CT imaging of the chest was performed following the standard protocol during bolus administration of intravenous contrast.  CONTRAST:  170mL OMNIPAQUE IOHEXOL 300 MG/ML  SOLN  COMPARISON:  PET-CT dated 01/26/2014. CT neck, chest, and abdomen/pelvis dated 01/17/2014.  FINDINGS: CT NECK FINDINGS  Pharynx and larynx: Within normal limits.  Salivary glands: Within normal limits.  Thyroid: Within normal limits.  Lymph nodes: Mildly prominent left lower cervical nodes measuring up to 7 mm short axis (series 8/ image 69), previously 8 mm.  Left supraclavicular lymph nodes measuring up to 10 mm short axis (series 8/ image 91), previously 16 mm.  Vascular: Within normal limits.  Limited intracranial: Within normal limits.  Visualized orbits: Visualized globes and retroconal soft tissues are within normal limits.  Mastoids and visualized paranasal sinuses: Visualized paranasal sinuses and mastoid air cells are clear.  Skeleton: Mild degenerative changes at C5-6.  CT CHEST FINDINGS  Mediastinum/Nodes: The heart is normal in size. No pericardial effusion.  Three vessel coronary atherosclerosis.  Mild thoracic lymphadenopathy, including:  --17 mm short axis left axillary node (Series 3/ image 11), previously 23 mm  --13 mm short axis right axillary node (series 3/ image 12), unchanged  --12 mm short axis low right paratracheal node (series 3/image 20), previously 19 mm  --8 mm short axis subcarinal node (series 3/ image 24), previously 18 mm  Lungs/Pleura: 3 mm left lower lobe nodule (series 6/ image 32), unchanged.  Mild centrilobular emphysematous changes.  No focal consolidation.  No pleural effusion or pneumothorax.  Musculoskeletal: Mild degenerative changes of the thoracic spine.  CT ABDOMEN PELVIS FINDINGS  Hepatobiliary: Liver is notable for hepatic steatosis with mild focal fatty sparing in the  gallbladder fossa.  Gallbladder is unremarkable. No intrahepatic or extrahepatic ductal dilatation.  Pancreas: Within normal limits.  Spleen: Within normal limits.  Adrenals/Urinary Tract: Adrenal glands are within normal limits.  Kidneys are within normal limits.  No hydronephrosis.  Bladder is within normal limits.  Stomach/Bowel: Stomach is within normal limits.  No evidence of bowel obstruction.  Normal appendix.  Mild to moderate colonic stool burden.  Vascular/Lymphatic: No evidence of abdominal aortic aneurysm.  Stable 7.6 x 5.5 cm calcified mass in the porta hepatis, compatible with known calcified hepatic artery aneurysm, with adjacent embolization coils.  Mildly enlarged retroperitoneal nodes, including:  --17 mm short axis left para-aortic node (series 3/ image 85), previously 22 mm  --10 mm short axis right common iliac node (series 3/image 91), unchanged  Reproductive: Prostate is unremarkable.  Other: Mild degenerative changes of the lumbar spine.  Musculoskeletal: Scalloping along the posterior aspect of the L1 vertebral body (sagittal image 65), unchanged.  No focal osseous lesions.  IMPRESSION: Left supraclavicular, axillary, mediastinal, and para-aortic lymphadenopathy, mildly improved.   Electronically Signed   By: Julian Hy M.D.   On: 04/20/2014 10:16   ASSESSMENT AND PLAN: This is a very pleasant 49 years old white male with history of stage III Hodgkin's lymphoma status post 6 cycles of systemic chemotherapy with ABVD with almost complete response. He is currently undergoing salvage chemotherapy with ICE status post 3 cycles and tolerated the first cycle fairly well. The last CT scan of the neck, chest, abdomen and pelvis showed partial improvement in his disease. The patient was discussed with and also seen by Dr. Julien Nordmann. He will proceed with cycle #4 on 05/15/2014 as scheduled. We will schedule him to have a restaging PET scan to re-evaluate his disease on 05/29/2014 in time for  the results to be available for his appointment with Dr. Evette Doffing on 05/31/2014 at [facility] stem cell transplant.  We will arrange for the patient to be admitted to [facility] early next week for the fourth cycle of his chemotherapy.   He was advised to call immediately if he has any concerning symptoms in the interval. The patient voices understanding of current disease status and treatment options and is in agreement with the current care plan.  All questions were answered. The patient knows to call the clinic with any problems, questions or concerns. We can certainly see the patient much sooner if necessary.  Carlton Adam, PA-C 05/12/2014  ADDENDUM:  Hematology/Oncology Attending:  I had a face to face encounter with the patient.  I recommended his care plan. This is a very pleasant 49 years old white male with recurrent Hodgkin's lymphoma currently undergoing salvage chemotherapy with ICE, status post 3 cycles. The patient is tolerating his treatment fairly well with no significant adverse effects except for mild fatigue. He is here today for evaluation before admission to the hospital early next week for the fourth cycle of his treatment. We will arrange for the patient to have repeat PET scan before his visit with the stem cell transplant team at the end see [facility] on 05/31/2014. I would see him back for follow-up visit in 3 weeks for reevaluation and discussion of any further treatment options. The patient will continue to have weekly blood work after his fourth cycle of the chemotherapy. He was advised to call immediately if he has any concerning symptoms in the interval.  Disclaimer: This note was dictated with voice recognition software. Similar sounding words can inadvertently be transcribed and may be missed upon review. Eilleen Kempf., MD 05/14/2014

## 2014-05-12 NOTE — Telephone Encounter (Signed)
Abelina Bachelor s/w me. Then I called pt that Dr Julien Nordmann wants to see him today. Please come in right away to see mid-level/MD before lab appt. Apologized for the missed information. Pt was OK with coming in right away.

## 2014-05-12 NOTE — Telephone Encounter (Signed)
S/w pt and set up lab appt at 11 am today.

## 2014-05-12 NOTE — Telephone Encounter (Signed)
gave and printed appt sched and avs fo rpt for May °

## 2014-05-12 NOTE — Telephone Encounter (Signed)
s.w. pt and confirmed appt with pt .Marland KitchenMarland KitchenMarland KitchenMarland Kitchenpt ok

## 2014-05-13 NOTE — Patient Instructions (Signed)
Return for your next cycle of chemotherapy as scheduled Follow up in one month

## 2014-05-15 ENCOUNTER — Inpatient Hospital Stay (HOSPITAL_COMMUNITY)
Admission: RE | Admit: 2014-05-15 | Discharge: 2014-05-18 | DRG: 847 | Disposition: A | Discharge status: Home / Self Care | Payer: Commercial Managed Care - PPO | Source: Ambulatory Visit | Attending: Internal Medicine | Admitting: Internal Medicine

## 2014-05-15 ENCOUNTER — Encounter (HOSPITAL_COMMUNITY): Payer: Self-pay

## 2014-05-15 DIAGNOSIS — E039 Hypothyroidism, unspecified: Secondary | ICD-10-CM

## 2014-05-15 DIAGNOSIS — E669 Obesity, unspecified: Secondary | ICD-10-CM

## 2014-05-15 DIAGNOSIS — T451X5A Adverse effect of antineoplastic and immunosuppressive drugs, initial encounter: Secondary | ICD-10-CM | POA: Diagnosis present

## 2014-05-15 DIAGNOSIS — Z87891 Personal history of nicotine dependence: Secondary | ICD-10-CM

## 2014-05-15 DIAGNOSIS — R109 Unspecified abdominal pain: Secondary | ICD-10-CM

## 2014-05-15 DIAGNOSIS — Z9189 Other specified personal risk factors, not elsewhere classified: Secondary | ICD-10-CM

## 2014-05-15 DIAGNOSIS — E785 Hyperlipidemia, unspecified: Secondary | ICD-10-CM

## 2014-05-15 DIAGNOSIS — R110 Nausea: Secondary | ICD-10-CM

## 2014-05-15 DIAGNOSIS — Z807 Family history of other malignant neoplasms of lymphoid, hematopoietic and related tissues: Secondary | ICD-10-CM

## 2014-05-15 DIAGNOSIS — Z8249 Family history of ischemic heart disease and other diseases of the circulatory system: Secondary | ICD-10-CM | POA: Diagnosis not present

## 2014-05-15 DIAGNOSIS — R112 Nausea with vomiting, unspecified: Secondary | ICD-10-CM

## 2014-05-15 DIAGNOSIS — R197 Diarrhea, unspecified: Secondary | ICD-10-CM

## 2014-05-15 DIAGNOSIS — R05 Cough: Secondary | ICD-10-CM

## 2014-05-15 DIAGNOSIS — F32A Depression, unspecified: Secondary | ICD-10-CM

## 2014-05-15 DIAGNOSIS — C8590 Non-Hodgkin lymphoma, unspecified, unspecified site: Secondary | ICD-10-CM | POA: Diagnosis present

## 2014-05-15 DIAGNOSIS — K5900 Constipation, unspecified: Secondary | ICD-10-CM

## 2014-05-15 DIAGNOSIS — F329 Major depressive disorder, single episode, unspecified: Secondary | ICD-10-CM | POA: Diagnosis present

## 2014-05-15 DIAGNOSIS — E119 Type 2 diabetes mellitus without complications: Secondary | ICD-10-CM | POA: Diagnosis present

## 2014-05-15 DIAGNOSIS — Z808 Family history of malignant neoplasm of other organs or systems: Secondary | ICD-10-CM | POA: Diagnosis not present

## 2014-05-15 DIAGNOSIS — G4700 Insomnia, unspecified: Secondary | ICD-10-CM

## 2014-05-15 DIAGNOSIS — Z5111 Encounter for antineoplastic chemotherapy: Secondary | ICD-10-CM | POA: Diagnosis present

## 2014-05-15 DIAGNOSIS — C8120 Mixed cellularity classical Hodgkin lymphoma, unspecified site: Secondary | ICD-10-CM | POA: Diagnosis not present

## 2014-05-15 DIAGNOSIS — D630 Anemia in neoplastic disease: Secondary | ICD-10-CM | POA: Diagnosis present

## 2014-05-15 DIAGNOSIS — C8190 Hodgkin lymphoma, unspecified, unspecified site: Secondary | ICD-10-CM | POA: Diagnosis present

## 2014-05-15 DIAGNOSIS — I10 Essential (primary) hypertension: Secondary | ICD-10-CM | POA: Diagnosis present

## 2014-05-15 DIAGNOSIS — D6481 Anemia due to antineoplastic chemotherapy: Secondary | ICD-10-CM

## 2014-05-15 DIAGNOSIS — I427 Cardiomyopathy due to drug and external agent: Secondary | ICD-10-CM

## 2014-05-15 DIAGNOSIS — I5189 Other ill-defined heart diseases: Secondary | ICD-10-CM

## 2014-05-15 DIAGNOSIS — E876 Hypokalemia: Secondary | ICD-10-CM

## 2014-05-15 DIAGNOSIS — Z833 Family history of diabetes mellitus: Secondary | ICD-10-CM | POA: Diagnosis not present

## 2014-05-15 DIAGNOSIS — R059 Cough, unspecified: Secondary | ICD-10-CM

## 2014-05-15 DIAGNOSIS — Z9109 Other allergy status, other than to drugs and biological substances: Secondary | ICD-10-CM

## 2014-05-15 LAB — CBC WITH DIFFERENTIAL/PLATELET
Basophils Absolute: 0.1 10*3/uL (ref 0.0–0.1)
Basophils Relative: 1 % (ref 0–1)
EOS ABS: 0 10*3/uL (ref 0.0–0.7)
EOS PCT: 0 % (ref 0–5)
HEMATOCRIT: 27 % — AB (ref 39.0–52.0)
Hemoglobin: 9.2 g/dL — ABNORMAL LOW (ref 13.0–17.0)
Lymphocytes Relative: 20 % (ref 12–46)
Lymphs Abs: 2 10*3/uL (ref 0.7–4.0)
MCH: 33.3 pg (ref 26.0–34.0)
MCHC: 34.1 g/dL (ref 30.0–36.0)
MCV: 97.8 fL (ref 78.0–100.0)
MONOS PCT: 9 % (ref 3–12)
Monocytes Absolute: 0.9 10*3/uL (ref 0.1–1.0)
NEUTROS ABS: 6.9 10*3/uL (ref 1.7–7.7)
NEUTROS PCT: 70 % (ref 43–77)
Platelets: 254 10*3/uL (ref 150–400)
RBC: 2.76 MIL/uL — AB (ref 4.22–5.81)
RDW: 18 % — ABNORMAL HIGH (ref 11.5–15.5)
WBC: 9.9 10*3/uL (ref 4.0–10.5)

## 2014-05-15 LAB — COMPREHENSIVE METABOLIC PANEL
ALT: 45 U/L (ref 17–63)
ANION GAP: 10 (ref 5–15)
AST: 35 U/L (ref 15–41)
Albumin: 4.4 g/dL (ref 3.5–5.0)
Alkaline Phosphatase: 87 U/L (ref 38–126)
BUN: 21 mg/dL — ABNORMAL HIGH (ref 6–20)
CALCIUM: 9.5 mg/dL (ref 8.9–10.3)
CO2: 26 mmol/L (ref 22–32)
Chloride: 97 mmol/L — ABNORMAL LOW (ref 101–111)
Creatinine, Ser: 1.34 mg/dL — ABNORMAL HIGH (ref 0.61–1.24)
GFR calc Af Amer: >60 mL/min (ref >60)
Glucose, Bld: 187 mg/dL — ABNORMAL HIGH (ref 70–99)
POTASSIUM: 4.5 mmol/L (ref 3.5–5.1)
Sodium: 133 mmol/L — ABNORMAL LOW (ref 135–145)
Total Bilirubin: 0.8 mg/dL (ref 0.3–1.2)
Total Protein: 8.1 g/dL (ref 6.5–8.1)

## 2014-05-15 LAB — GLUCOSE, CAPILLARY: Glucose-Capillary: 323 mg/dL — ABNORMAL HIGH (ref 70–99)

## 2014-05-15 MED ORDER — ONDANSETRON HCL 4 MG PO TABS: 4.0000 mg | ORAL_TABLET | Freq: Three times a day (TID) | ORAL | Status: DC | PRN

## 2014-05-15 MED ORDER — HEPARIN SOD (PORK) LOCK FLUSH 100 UNIT/ML IV SOLN: 500.0000 [IU] | Freq: Once | INTRAVENOUS | Status: AC | PRN

## 2014-05-15 MED ORDER — ALLOPURINOL 300 MG PO TABS
300.0000 mg | ORAL_TABLET | Freq: Every day | ORAL | Status: DC
  Administered 2014-05-15 – 2014-05-18 (×4): 300 mg via ORAL
  Filled 2014-05-15 (×4): qty 1

## 2014-05-15 MED ORDER — ALUM & MAG HYDROXIDE-SIMETH 200-200-20 MG/5ML PO SUSP: 60.0000 mL | ORAL | Status: DC | PRN

## 2014-05-15 MED ORDER — SODIUM CHLORIDE 0.9 % IV SOLN
8.0000 mg | Freq: Three times a day (TID) | INTRAVENOUS | Status: DC | PRN
  Filled 2014-05-15: qty 4

## 2014-05-15 MED ORDER — SODIUM CHLORIDE 0.9 % IV SOLN
INTRAVENOUS | Status: DC
  Administered 2014-05-15: 13:00:00 via INTRAVENOUS
  Administered 2014-05-18: 250 mL via INTRAVENOUS

## 2014-05-15 MED ORDER — SODIUM CHLORIDE 0.9 % IJ SOLN: 3.0000 mL | INTRAMUSCULAR | Status: DC | PRN

## 2014-05-15 MED ORDER — HYDROCORTISONE 2.5 % RE CREA: 1.0000 "application " | TOPICAL_CREAM | Freq: Two times a day (BID) | RECTAL | Status: DC | PRN

## 2014-05-15 MED ORDER — SODIUM CHLORIDE 0.9 % IV SOLN
INTRAVENOUS | Status: AC
  Administered 2014-05-15 – 2014-05-17 (×3): 16 mg via INTRAVENOUS
  Filled 2014-05-15 (×3): qty 8

## 2014-05-15 MED ORDER — GUAIFENESIN-DM 100-10 MG/5ML PO SYRP: 10.0000 mL | ORAL_SOLUTION | ORAL | Status: DC | PRN

## 2014-05-15 MED ORDER — ACETAMINOPHEN 325 MG PO TABS: 650.0000 mg | ORAL_TABLET | ORAL | Status: DC | PRN

## 2014-05-15 MED ORDER — SENNOSIDES-DOCUSATE SODIUM 8.6-50 MG PO TABS: 1.0000 | ORAL_TABLET | Freq: Every evening | ORAL | Status: DC | PRN

## 2014-05-15 MED ORDER — ALTEPLASE 2 MG IJ SOLR: 2.0000 mg | Freq: Once | INTRAMUSCULAR | Status: AC | PRN

## 2014-05-15 MED ORDER — SERTRALINE HCL 100 MG PO TABS
100.0000 mg | ORAL_TABLET | Freq: Every day | ORAL | Status: DC
  Administered 2014-05-16 – 2014-05-18 (×3): 100 mg via ORAL
  Filled 2014-05-15 (×4): qty 1

## 2014-05-15 MED ORDER — ENOXAPARIN SODIUM 40 MG/0.4ML ~~LOC~~ SOLN
40.0000 mg | SUBCUTANEOUS | Status: DC
  Administered 2014-05-15 – 2014-05-17 (×3): 40 mg via SUBCUTANEOUS
  Filled 2014-05-15 (×4): qty 0.4

## 2014-05-15 MED ORDER — HEPARIN SOD (PORK) LOCK FLUSH 100 UNIT/ML IV SOLN: 250.0000 [IU] | Freq: Once | INTRAVENOUS | Status: AC | PRN

## 2014-05-15 MED ORDER — ONDANSETRON HCL 4 MG/2ML IJ SOLN: 4.0000 mg | Freq: Three times a day (TID) | INTRAMUSCULAR | Status: DC | PRN

## 2014-05-15 MED ORDER — SODIUM CHLORIDE 0.9 % IJ SOLN: 10.0000 mL | INTRAMUSCULAR | Status: DC | PRN

## 2014-05-15 MED ORDER — METFORMIN HCL 500 MG PO TABS
1000.0000 mg | ORAL_TABLET | Freq: Two times a day (BID) | ORAL | Status: DC
  Administered 2014-05-15 – 2014-05-18 (×6): 1000 mg via ORAL
  Filled 2014-05-15 (×8): qty 2

## 2014-05-15 MED ORDER — ZOLPIDEM TARTRATE 5 MG PO TABS
5.0000 mg | ORAL_TABLET | Freq: Every evening | ORAL | Status: DC | PRN
  Administered 2014-05-15 – 2014-05-17 (×3): 5 mg via ORAL
  Filled 2014-05-15 (×3): qty 1

## 2014-05-15 MED ORDER — ONDANSETRON 4 MG PO TBDP: 4.0000 mg | ORAL_TABLET | Freq: Three times a day (TID) | ORAL | Status: DC | PRN

## 2014-05-15 MED ORDER — HOT PACK MISC ONCOLOGY
1.0000 | Freq: Once | Status: AC | PRN
  Filled 2014-05-15: qty 1

## 2014-05-15 MED ORDER — SODIUM CHLORIDE 0.9 % IV SOLN
100.0000 mg/m2 | INTRAVENOUS | Status: AC
  Administered 2014-05-15 – 2014-05-17 (×3): 230 mg via INTRAVENOUS
  Filled 2014-05-15 (×3): qty 11.5

## 2014-05-15 NOTE — Progress Notes (Signed)
Etoposide dosage calculations checked with Marisa Sprinkles, RN.

## 2014-05-15 NOTE — H&P (Signed)
[place] [phone]   [phone]  HISTORY AND PHYSICAL  REASON FOR ADMISSION:  49 year old white male with relapsed Hodgkin's disease admitted for chemotherapy.  HPI [name] [name] is a 49 y.o. male with past medical history significant for hypertension, diabetes mellitus, depression as well as relapsed Hodgkin disease currently undergoing salvage chemotherapy with ICE (ifosfamide/mesna, carboplatin and etoposide) status post 3 cycles. Restaging scan after cycle #2 showed partial response of his disease. The scan results was discussed with Dr. Evette [name] at [facility] stem cell transplant unit. He recommended to proceed with 2 more cycles of salvage chemotherapy before considering the patient for stem cell transplant. The patient is here today to start cycle # 4. He is feeling fine with no specific complaints except mild fatigue with exertion. He denied having any significant fever or chills, no nausea or vomiting. The patient denied having any significant chest pain, shortness breath, cough or hemoptysis.  HPI  Past Medical History  Diagnosis Date  . Hypertension   . Diabetes mellitus without complication     Pre-diabetes  . Depression   . Headache(784.0)   . Thyroid disease     Hx: of  . Cancer 2015    cervical lymphadenopathy  . Hodgkin lymphoma 2015  . Hepatic artery aneurysm 2015    Past Surgical History  Procedure Laterality Date  . Tonsillectomy    . Mass excision Left 12/31/2012    Procedure: CERVICAL LYMPH NODE BIOPSY;  Surgeon: [name] Ok, MD;  Location: [facility];  Service: General;  Laterality: Left;    Family History  Problem Relation Age of Onset  . Aneurysm Mother 95    brain  . Obesity Mother   . Heart disease Mother   . AAA (abdominal aortic aneurysm) Mother     dx in her 26s  . Hodgkin's lymphoma Father     dx in his 13s, and again in his 71s  . Diabetes Father   . Cancer Brother     NOS dx in his 28s    . Bone cancer Maternal Uncle 40    don't know if primary or metastises    Social History History  Substance Use Topics  . Smoking status: Former Smoker -- 1.00 packs/day for 31 years    Types: Cigarettes    Quit date: 06/07/2012  . Smokeless tobacco: Never Used  . Alcohol Use: No    No Known Allergies  No current facility-administered medications for this encounter.    Review of Systems  Constitutional: negative Eyes: negative Ears, nose, mouth, throat, and face: negative Respiratory: negative Cardiovascular: negative Gastrointestinal: negative Genitourinary:negative Integument/breast: negative Hematologic/lymphatic: negative Musculoskeletal:negative Neurological: negative Behavioral/Psych: negative Endocrine: negative Allergic/Immunologic: negative     Physical Exam  NOI:BBCWU, healthy, no distress, well nourished and well developed SKIN: skin color, texture, turgor are normal, no rashes or significant lesions HEAD: Normocephalic, No masses, lesions, tenderness or abnormalities EYES: normal, PERRLA EARS: External ears normal, Canals clear OROPHARYNX:no exudate, no erythema and lips, buccal mucosa, and tongue normal  NECK: supple, no adenopathy, no JVD LYMPH:  no palpable lymphadenopathy, no hepatosplenomegaly LUNGS: clear to auscultation , and palpation HEART: regular rate & rhythm, no murmurs and no gallops ABDOMEN:abdomen soft, non-tender, obese, normal bowel sounds and no masses or organomegaly BACK: Back symmetric, no curvature., No CVA tenderness EXTREMITIES:no joint deformities, effusion, or inflammation, no edema, no skin discoloration  NEURO: alert & oriented x 3 with fluent speech, no focal motor/sensory  deficits  PERFORMANCE STATUS: ECOG 1  LABORATORY DATA: Lab Results  Component Value Date   WBC 14.4* 05/12/2014   HGB 9.5* 05/12/2014   HCT 28.4* 05/12/2014   MCV 96.6 05/12/2014   PLT 270 05/12/2014   Estimated Creatinine Clearance: 107.5  mL/min (by C-G formula based on Cr of 1). @LASTCHEM @  RADIOGRAPHIC STUDIES: None today  ASSESSMENT and PLAN:  1) Relapsed Hodgkin's lymphoma: The patient is admitted today 5/2  for cycle #4 of salvage chemotherapy with ICE, day 1-4 We'll start the patient on hydration with IV normal saline. The patient will continue on allopurinol for tumor lysis prophylaxis. 2) Hypertension: The patient will continue on Coreg and lisinopril. 3) Diabetes mellitus: He will continue his current treatment with metformin. 4) DVT prophylaxis: We'll start the patient on Lovenox 40 mg subcutaneously daily. 5) Hypothyroidism: Continue levothyroxine. 6. Anemia: Likely due to recent chemo. No bleeding issues noted. Hb was 9.5 on 4/29, will check CBC today. 7. Full Code Will continue to monitor his blood work on daily basis.   The patient voices understanding of current disease status and treatment options and is in agreement with the current care plan.  Disclaimer: This note was dictated with voice recognition software. Similar sounding words can inadvertently be transcribed and may not be corrected upon review.   [facility] E May 15, 2014, 8:47 AM   ADDENDUM: Hematology/Oncology Attending: The patient is seen and examined. I agree with the above note. He is a very pleasant 49 Years old white male with relapsed Hodgkin's lymphoma currently undergoing treatment with salvage chemotherapy with ICE, s/p 3 cycles. He tolerated his treatment well so far. He is admitted today for cycle# 4. This will be followed by restaging PET and evaluation by Dr. Evette [name] at [name] for Stem cell transplant. Continue with IV Hydration and Allopurinol. Continue other home medications.

## 2014-05-16 LAB — GLUCOSE, CAPILLARY: Glucose-Capillary: 176 mg/dL — ABNORMAL HIGH (ref 70–99)

## 2014-05-16 MED ORDER — FLUTICASONE PROPIONATE 50 MCG/ACT NA SUSP
1.0000 | Freq: Two times a day (BID) | NASAL | Status: DC
  Administered 2014-05-16 – 2014-05-18 (×5): 1 via NASAL
  Filled 2014-05-16: qty 16

## 2014-05-16 MED ORDER — SODIUM CHLORIDE 0.9 % IV SOLN
605.0000 mg | Freq: Once | INTRAVENOUS | Status: AC
  Administered 2014-05-16: 610 mg via INTRAVENOUS
  Filled 2014-05-16: qty 61

## 2014-05-16 MED ORDER — LISINOPRIL 10 MG PO TABS
10.0000 mg | ORAL_TABLET | Freq: Every day | ORAL | Status: DC
  Administered 2014-05-16 – 2014-05-18 (×3): 10 mg via ORAL
  Filled 2014-05-16 (×4): qty 1

## 2014-05-16 MED ORDER — SODIUM CHLORIDE 0.9 % IV SOLN
Freq: Once | INTRAVENOUS | Status: AC
  Administered 2014-05-16: 17:00:00 via INTRAVENOUS
  Filled 2014-05-16: qty 228

## 2014-05-16 MED ORDER — AZELASTINE-FLUTICASONE 137-50 MCG/ACT NA SUSP: 1.0000 | Freq: Two times a day (BID) | NASAL | Status: DC

## 2014-05-16 MED ORDER — PRAVASTATIN SODIUM 40 MG PO TABS
40.0000 mg | ORAL_TABLET | Freq: Every day | ORAL | Status: DC
  Administered 2014-05-16 – 2014-05-17 (×2): 40 mg via ORAL
  Filled 2014-05-16 (×3): qty 1

## 2014-05-16 MED ORDER — AZELASTINE HCL 0.1 % NA SOLN
1.0000 | Freq: Two times a day (BID) | NASAL | Status: DC
  Administered 2014-05-16 – 2014-05-18 (×5): 1 via NASAL
  Filled 2014-05-16: qty 30

## 2014-05-16 MED ORDER — LOVASTATIN ER 40 MG PO TB24: 40.0000 mg | ORAL_TABLET | Freq: Every day | ORAL | Status: DC

## 2014-05-16 NOTE — Progress Notes (Signed)
Pt's BSA, dosages and dilutions for Vepesid, Ifosfamide, Mesna, and Carboplatin verified with 2nd RN Reyne Dumas.

## 2014-05-16 NOTE — Progress Notes (Signed)
Tolerating chemo well.Portacath remained with good blood return. No complaints.

## 2014-05-16 NOTE — Progress Notes (Signed)
Inpatient Diabetes Program Recommendations  AACE/ADA: New Consensus Statement on Inpatient Glycemic Control (2013)  Target Ranges:  Prepandial:   less than 140 mg/dL      Peak postprandial:   less than 180 mg/dL (1-2 hours)      Critically ill patients:  140 - 180 mg/dL     Results for SABAN, HEINLEN (MRN [id number]) as of 05/16/2014 10:08  Ref. Range 05/15/2014 20:39  Glucose-Capillary Latest Ref Range: 70-99 mg/dL 323 (H)     Admit for: Chemo for Stage III Hodgkin's lymphoma   History: DM, HTN  Home DM Meds: Metformin 1000 mg bid  Current DM Orders: Metformin 1000 mg bid    **Note patient is receiving IV Zofran/ IV Decadron (20 mg) daily when he receives his chemotherapy.  **CBG last PM was 323 mg/dl.  **Note home dose of Metformin was started last PM.    MD- Please consider checking patient's CBGs tid ac + HS and Cover with Novolog Moderate SSI while patient hospitalized and receiving steroids daily.    Will follow Wyn Quaker RN, MSN, CDE Diabetes Coordinator Inpatient Diabetes Program Team Pager: [phone] (8a-5p)

## 2014-05-16 NOTE — Progress Notes (Signed)
[name] [name]   DOB:01/12/1966   NI#:[id number]   IRW#:[id number]  Patient Care Team: Pcp Not In System as PCP - General Jacelyn Pi, MD as Consulting Physician (Endocrinology)  Subjective: Patient seen and examined. Tolerated day 1 chemo well without side effects. Denies fevers, chills, night sweats, vision changes, or mucositis. Denies any respiratory complaints. Denies any chest pain or palpitations. Denies lower extremity swelling. Denies nausea, heartburn or change in bowel habits  Appetite is normal. Denies any dysuria. Denies abnormal skin rashes, or neuropathy. Denies any bleeding issues such as epistaxis, hematemesis, hematuria or hematochezia. Ambulating without difficulty.   Scheduled Meds: . allopurinol  300 mg Oral Daily  . enoxaparin (LOVENOX) injection  40 mg Subcutaneous Q24H  . etoposide  100 mg/m2 (Treatment Plan Actual) Intravenous Q24H  . metFORMIN  1,000 mg Oral BID WC  . ondansetron (ZOFRAN) with dexamethasone (DECADRON) IV   Intravenous Q24H  . sertraline  100 mg Oral Q breakfast   Continuous Infusions: . sodium chloride 20 mL/hr at 05/15/14 1246   PRN Meds:acetaminophen, alum & mag hydroxide-simeth, guaiFENesin-dextromethorphan, hydrocortisone, ondansetron **OR** ondansetron **OR** ondansetron (ZOFRAN) IV **OR** ondansetron (ZOFRAN) IV, senna-docusate, sodium chloride, sodium chloride, zolpidem   Objective:  Filed Vitals:   05/16/14 0613  BP: 119/85  Pulse: 74  Temp: 97.6 F (36.4 C)  Resp: 16      Intake/Output Summary (Last 24 hours) at 05/16/14 0807 Last data filed at 05/16/14 7619  Gross per 24 hour  Intake 584.67 ml  Output    650 ml  Net -65.33 ml    ECOG PERFORMANCE STATUS:0  GENERAL:alert, no distress and comfortable SKIN: skin color, texture, turgor are normal, no rashes or significant lesions EYES: normal, conjunctiva are pink and non-injected, sclera clear OROPHARYNX:no exudate, no erythema and lips, buccal mucosa, and tongue normal   NECK: supple, thyroid normal size, non-tender, without nodularity LYMPH:  no palpable lymphadenopathy in the cervical, axillary or inguinal LUNGS: clear to auscultation and percussion with normal breathing effort HEART: regular rate & rhythm and no murmurs and no lower extremity edema ABDOMEN: soft, non-tender and normal bowel sounds Musculoskeletal:no cyanosis of digits and no clubbing  PSYCH: alert & oriented x 3 with fluent speech NEURO: no focal motor/sensory deficits    CBG (last 3)   Recent Labs  05/15/14 2039  GLUCAP 323*     Labs:   Recent Labs Lab 05/12/14 1130 05/15/14 1043  WBC 14.4* 9.9  HGB 9.5* 9.2*  HCT 28.4* 27.0*  PLT 270 254  MCV 96.6 97.8  MCH 32.3 33.3  MCHC 33.5 34.1  RDW 16.6* 18.0*  LYMPHSABS 2.1 2.0  MONOABS 1.4* 0.9  EOSABS 0.0 0.0  BASOSABS 0.1 0.1     Chemistries:    Recent Labs Lab 05/12/14 1131 05/15/14 1043  NA 136 133*  K 4.5 4.5  CL  --  97*  CO2 22 26  GLUCOSE 193* 187*  BUN 9.7 21*  CREATININE 1.0 1.34*  CALCIUM 9.3 9.5  AST 41* 35  ALT 59* 45  ALKPHOS 120 87  BILITOT 0.46 0.8    GFR Estimated Creatinine Clearance: 80.2 mL/min (by C-G formula based on Cr of 1.34).  Liver Function Tests:  Recent Labs Lab 05/12/14 1131 05/15/14 1043  AST 41* 35  ALT 59* 45  ALKPHOS 120 87  BILITOT 0.46 0.8  PROT 7.6 8.1  ALBUMIN 4.0 4.4    Recent Labs Lab 05/15/14 2039  GLUCAP 323*     Imaging Studies:  No  results found.  Assessment/Plan: 49 y.o.  Stage III Hodgkin's lymphoma  He was admitted on 5/2 for cycle 4 of salvage chemotherapy with ICE, day 1-4 He tolerated day 1 chemo without any side effects. Will proceed with day 2 The patient will receive Neulasta injection as outpatient  Tumor Lysis Prophylaxis Patient is at risk of tumor lysis He is on allopurinol   Anemia in neoplastic disease This is likely due to recent chemotherapy No transfusion is indicated at this time Monitor counts closely    DVT prophylaxis He is onLovenox prophylaxis at 40 mg daily sq  Diabetes mellitus He is on Metformin  Hypothyroidism On Synthroid  Full Code  **Disclaimer: This note was dictated with voice recognition software. Similar sounding words can inadvertently be transcribed and this note may contain transcription errors which may not have been corrected upon publication of note.Sharene Butters E, PA-C 05/16/2014  8:07 AM  ADDENDUM:  Hematology/Oncology Attending: The patient is seen and examined today. I agree with the above note. He is very pleasant 49 years old white male who was recently had relapsed Hodgkin's lymphoma and currently undergoing salvage chemotherapy with ICE status post 3 cycles. He is day 2 of the fourth cycle. He tolerated the first day of his treatment fairly well was no significant complaints. I recommended for the patient to continue his treatment today as scheduled. For tumor lysis prophylaxis, the patient will continue on allopurinol.

## 2014-05-17 DIAGNOSIS — D630 Anemia in neoplastic disease: Secondary | ICD-10-CM

## 2014-05-17 DIAGNOSIS — C8120 Mixed cellularity classical Hodgkin lymphoma, unspecified site: Secondary | ICD-10-CM

## 2014-05-17 LAB — GLUCOSE, CAPILLARY: GLUCOSE-CAPILLARY: 163 mg/dL — AB (ref 70–99)

## 2014-05-17 NOTE — Care Management Note (Signed)
Case Management Note  Patient Details  Name: [name] [name] MRN: [id number] Date of Birth: Aug 18, 1965  Subjective/Objective:        49 yo admitted with Lymphoma for Chemotherapy            Action/Plan: From home with Spouse  Expected Discharge Date:   (UNKNOWN)    05/18/14           Expected Discharge Plan:  Home/Self Care  In-House Referral:     Discharge planning Services  CM Consult  Post Acute Care Choice:    Choice offered to:     DME Arranged:    DME Agency:     HH Arranged:    HH Agency:     Status of Service:  In process, will continue to follow  Medicare Important Message Given:    Date Medicare IM Given:    Medicare IM give by:    Date Additional Medicare IM Given:    Additional Medicare Important Message give by:     If discussed at [place] of Stay Meetings, dates discussed:    Additional Comments: Chart reviewed and CM following for DC needs. No DC needs noted at this time. Lynnell Catalan, RN 05/17/2014, 3:13 PM

## 2014-05-17 NOTE — Progress Notes (Signed)
Tolerated chemo well. Portacath remained with good blood return.

## 2014-05-17 NOTE — Progress Notes (Signed)
[name] [name]   DOB:[birth year]   XN#:[id number]   URK#:[id number]  Patient Care Team: Pcp Not In System as PCP - General Jacelyn Pi, MD as Consulting Physician (Endocrinology)  Subjective: Patient seen and examined. Tolerated day 2 chemo well without side effects. Denies fevers, chills, night sweats, vision changes, or mucositis. Denies any respiratory complaints. Denies any chest pain or palpitations. Denies lower extremity swelling. Denies nausea, heartburn or change in bowel habits.  Appetite is normal. Denies any dysuria. Denies abnormal skin rashes, or neuropathy. Denies any bleeding issues such as epistaxis, hematemesis, hematuria or hematochezia. Ambulating without difficulty.   Scheduled Meds: . allopurinol  300 mg Oral Daily  . azelastine  1 spray Each Nare BID  . enoxaparin (LOVENOX) injection  40 mg Subcutaneous Q24H  . etoposide  100 mg/m2 (Treatment Plan Actual) Intravenous Q24H  . fluticasone  1 spray Each Nare BID  . ifosfamide/mesna (IFEX/MESNEX) CHEMO for Inpatient CI in 1000 ml   Intravenous Once  . lisinopril  10 mg Oral Q breakfast  . metFORMIN  1,000 mg Oral BID WC  . ondansetron (ZOFRAN) with dexamethasone (DECADRON) IV   Intravenous Q24H  . pravastatin  40 mg Oral QHS  . sertraline  100 mg Oral Q breakfast   Continuous Infusions: . sodium chloride 20 mL/hr at 05/15/14 1246   PRN Meds:acetaminophen, alum & mag hydroxide-simeth, guaiFENesin-dextromethorphan, hydrocortisone, ondansetron **OR** ondansetron **OR** ondansetron (ZOFRAN) IV **OR** ondansetron (ZOFRAN) IV, senna-docusate, sodium chloride, sodium chloride, zolpidem   Objective:  Filed Vitals:   05/17/14 0559  BP: 104/71  Pulse: 95  Temp: 97.9 F (36.6 C)  Resp: 16      Intake/Output Summary (Last 24 hours) at 05/17/14 0815 Last data filed at 05/17/14 0600  Gross per 24 hour  Intake 2248.67 ml  Output   2525 ml  Net -276.33 ml    ECOG PERFORMANCE STATUS:0  GENERAL:alert, no distress and  comfortable SKIN: skin color, texture, turgor are normal, no rashes or significant lesions EYES: normal, conjunctiva are pink and non-injected, sclera clear OROPHARYNX:no exudate, no erythema and lips, buccal mucosa, and tongue normal  NECK: supple, thyroid normal size, non-tender, without nodularity LYMPH:  no palpable lymphadenopathy in the cervical, axillary or inguinal LUNGS: clear to auscultation and percussion with normal breathing effort HEART: regular rate & rhythm and no murmurs and no lower extremity edema ABDOMEN: soft, non-tender and normal bowel sounds Musculoskeletal:no cyanosis of digits and no clubbing  PSYCH: alert & oriented x 3 with fluent speech NEURO: no focal motor/sensory deficits    CBG (last 3)   Recent Labs  05/15/14 2039 05/16/14 1253 05/17/14 0751  GLUCAP 323* 176* 163*     Labs:   Recent Labs Lab 05/12/14 1130 05/15/14 1043  WBC 14.4* 9.9  HGB 9.5* 9.2*  HCT 28.4* 27.0*  PLT 270 254  MCV 96.6 97.8  MCH 32.3 33.3  MCHC 33.5 34.1  RDW 16.6* 18.0*  LYMPHSABS 2.1 2.0  MONOABS 1.4* 0.9  EOSABS 0.0 0.0  BASOSABS 0.1 0.1     Chemistries:    Recent Labs Lab 05/12/14 1131 05/15/14 1043  NA 136 133*  K 4.5 4.5  CL  --  97*  CO2 22 26  GLUCOSE 193* 187*  BUN 9.7 21*  CREATININE 1.0 1.34*  CALCIUM 9.3 9.5  AST 41* 35  ALT 59* 45  ALKPHOS 120 87  BILITOT 0.46 0.8    GFR Estimated Creatinine Clearance: 79.9 mL/min (by C-G formula based on Cr of 1.34).  Liver Function Tests:  Recent Labs Lab 05/12/14 1131 05/15/14 1043  AST 41* 35  ALT 59* 45  ALKPHOS 120 87  BILITOT 0.46 0.8  PROT 7.6 8.1  ALBUMIN 4.0 4.4    Recent Labs Lab 05/15/14 2039 05/16/14 1253 05/17/14 0751  GLUCAP 323* 176* 163*     Imaging Studies:  No results found.  Assessment/Plan: 49 y.o.  Stage III Hodgkin's lymphoma  He was admitted on 5/2 for cycle 4 of salvage chemotherapy with ICE, day 1-4 He tolerated day 2 chemo without any side  effects. Will proceed with day 3 The patient will receive Neulasta injection as outpatient on Friday May 6  Tumor Lysis Prophylaxis Patient is at risk of tumor lysis He is on allopurinol   Anemia in neoplastic disease This is likely due to recent chemotherapy No transfusion is indicated at this time Monitor counts closely   DVT prophylaxis He is onLovenox prophylaxis at 40 mg daily sq  Diabetes mellitus He is on Metformin  Hypothyroidism On Synthroid  Full Code  **Disclaimer: This note was dictated with voice recognition software. Similar sounding words can inadvertently be transcribed and this note may contain transcription errors which may not have been corrected upon publication of note.Sharene Butters E, PA-C 05/17/2014  8:15 AM  ADDENDUM: Hematology/Oncology Attending: The patient is seen and examined today. He agree with the above note. He is a very pleasant 49 years old white male with relapsed Hodgkin's lymphoma who is currently undergoing salvage chemotherapy with ICE status post 3 cycles and he is currently undergoing day#3 of the fourth cycle. The patient is tolerating his treatment well with no significant adverse effects. We will proceed with day 3 today as a scheduled. The patient is expected to be discharged home tomorrow with Neulasta injection on 05/19/2014.

## 2014-05-17 NOTE — Progress Notes (Signed)
Etoposide dosage and calculation checked with Aldean Baker RN.

## 2014-05-18 LAB — GLUCOSE, CAPILLARY: GLUCOSE-CAPILLARY: 104 mg/dL — AB (ref 70–99)

## 2014-05-18 MED ORDER — HEPARIN SOD (PORK) LOCK FLUSH 100 UNIT/ML IV SOLN
500.0000 [IU] | Freq: Once | INTRAVENOUS | Status: AC
  Administered 2014-05-18: 500 [IU] via INTRAVENOUS
  Filled 2014-05-18: qty 5

## 2014-05-18 NOTE — Discharge Summary (Signed)
Patient ID: [name] [name] MRN: [id number] DOB/AGE: Dec 02, 1965 49 y.o.  Admit date: 05/15/2014 Discharge date: 05/18/2014  Patient Care Team: Pcp Not In System as PCP - General Jacelyn Pi, MD as Consulting Physician (Endocrinology)  Brief History of Present Illness: For complete details please refer to admission H and P, but in brief, [name] [name] is a 49 year old white male with relapsed Hodgkin's disease admitted for Cycle 4 chemotherapy with ICE, day 1-3  Discharge Diagnoses/Hospital Course:  Stage III Hodgkin's lymphoma  He was admitted on 5/2 for cycle 4 of salvage chemotherapy with ICE, day 1-3 He tolerated chemo well without any side effects.  The patient will receive Neulasta injection as outpatient on Friday May 6  Tumor Lysis Prophylaxis Patient is at risk of tumor lysis He continued allopurinol while in hospital and is to take med after discharge as well  Anemia in neoplastic disease This is likely due to recent chemotherapy No bleeding issues were noted No transfusion was indicated at this time Monitor counts closely as outpatient   DVT prophylaxis He was placed onLovenox prophylaxis at 40 mg daily sq while hospitalized  Diabetes mellitus He continued his Metformin dose while in the hospital with close CBG monitoring  Hypothyroidism He continues his home Synthroid dose while in the hospital   Procedures: S/P Chemotherapy with cycle 4 of salvage chemotherapy with ICE, day 1-3    Consultations: none   Past Medical History  Diagnosis Date  . Hypertension   . Diabetes mellitus without complication     Pre-diabetes  . Depression   . Headache(784.0)   . Thyroid disease     Hx: of  . Cancer 2015    cervical lymphadenopathy  . Hodgkin lymphoma 2015  . Hepatic artery aneurysm 2015    Discharge Medications:    Medication List    TAKE these medications        allopurinol 100 MG tablet  Commonly known as:  ZYLOPRIM  Take 1 tablet  (100 mg total) by mouth 2 (two) times daily.     Azelastine-Fluticasone 137-50 MCG/ACT Susp  Place 1 spray into the nose 2 (two) times daily.     carvedilol 6.25 MG tablet  Commonly known as:  COREG  TAKE ONE TABLET BY MOUTH TWICE DAILY     eszopiclone 2 MG Tabs tablet  Commonly known as:  LUNESTA  Take 2 mg by mouth at bedtime as needed for sleep. Take immediately before bedtime     fenofibrate 145 MG tablet  Commonly known as:  TRICOR  TAKE ONE TABLET BY MOUTH ONCE DAILY     fexofenadine-pseudoephedrine 180-240 MG per 24 hr tablet  Commonly known as:  ALLEGRA-D 24  Take 1 tablet by mouth daily.     levothyroxine 200 MCG tablet  Commonly known as:  SYNTHROID, LEVOTHROID  Take 200 mcg by mouth daily before breakfast.     lisinopril 10 MG tablet  Commonly known as:  PRINIVIL,ZESTRIL  Take 10 mg by mouth daily with breakfast.     lovastatin 40 MG 24 hr tablet  Commonly known as:  ALTOPREV  Take 40 mg by mouth at bedtime.     metFORMIN 500 MG tablet  Commonly known as:  GLUCOPHAGE  Take 1,000 mg by mouth 2 (two) times daily.     OVER THE COUNTER MEDICATION  - Take 2.5 mLs by mouth daily. *Isotonix Antioxidant Powder*   - Mixes with water     oxyCODONE 5 MG immediate release  tablet  Commonly known as:  Oxy IR/ROXICODONE  Take 1 tablet (5 mg total) by mouth every 6 (six) hours as needed for severe pain.     potassium chloride 10 MEQ tablet  Commonly known as:  K-DUR,KLOR-CON  Take 10 mEq by mouth daily.     prochlorperazine 10 MG tablet  Commonly known as:  COMPAZINE  Take 1 tablet (10 mg total) by mouth every 6 (six) hours as needed for nausea or vomiting.     sertraline 100 MG tablet  Commonly known as:  ZOLOFT  Take 100 mg by mouth daily with breakfast.        Discharge Condition: Improved.  Diet recommendation:  Carbohydrate-modified.   Disposition and Follow-up:  Neulasta Injection Friday 5/6                                                                                                     Dr. Julien Nordmann on 5/26     ECOG PERFORMANCE STATUS: 0  Physical Exam at Discharge:  Subjective: Patient seen and examined. Tolerated chemo well without side effects. Denies fevers, chills, night sweats, vision changes, or mucositis. Denies any respiratory complaints. Denies any chest pain or palpitations. Denies lower extremity swelling. Denies nausea, heartburn or change in bowel habits.Appetite is normal. Denies any dysuria. Denies abnormal skin rashes, or neuropathy. Denies any bleeding issues such as epistaxis, hematemesis, hematuria or hematochezia. Ambulating without difficulty.   Objective:  BP 102/66 mmHg  Pulse 66  Temp(Src) 97.8 F (36.6 C) (Oral)  Resp 16  Ht 5\' 10"  (1.778 m)  Wt 208 lb 5.4 oz (94.5 kg)  BMI 29.89 kg/m2  SpO2 100%  GENERAL:alert, no distress and comfortable SKIN: skin color, texture, turgor are normal, no rashes or significant lesions EYES: normal, conjunctiva are pink and non-injected, sclera clear OROPHARYNX:no exudate, no erythema and lips, buccal mucosa, and tongue normal  NECK: supple, thyroid normal size, non-tender, without nodularity LYMPH:  no palpable lymphadenopathy in the cervical, axillary or inguinal area LUNGS: clear to auscultation and percussion with normal breathing effort HEART: regular rate & rhythm and no murmurs and no lower extremity edema ABDOMEN:abdomen soft, non-tender and normal bowel sounds Musculoskeletal:no cyanosis of digits and no clubbing  PSYCH: alert & oriented x 3 with fluent speech NEURO: no focal motor/sensory deficits    Significant Diagnostic Studies:  None during this hospitalization  Discharge Laboratory Values:  CBC  Recent Labs Lab 05/12/14 1130 05/15/14 1043  WBC 14.4* 9.9  HGB 9.5* 9.2*  HCT 28.4* 27.0*  PLT 270 254  MCV 96.6 97.8  MCH 32.3 33.3  MCHC 33.5 34.1  RDW 16.6* 18.0*  LYMPHSABS 2.1 2.0  MONOABS 1.4* 0.9  EOSABS 0.0 0.0  BASOSABS 0.1 0.1     Anemia panel:  No results for input(s): VITAMINB12, FOLATE, FERRITIN, TIBC, IRON, RETICCTPCT in the last 72 hours.   Chemistries   Recent Labs Lab 05/12/14 1131 05/15/14 1043  NA 136 133*  K 4.5 4.5  CL  --  97*  CO2 22 26  GLUCOSE 193* 187*  BUN 9.7 21*  CREATININE 1.0 1.34*  CALCIUM 9.3 9.5     Signed: Sharene Butters, PA-C 05/18/2014, 7:45 AM  ADDENDUM: Hematology/Oncology Attending: The patient is seen and examined today. He agree with the above note. He is a very pleasant 49 years old white male with relapsed Hodgkin's lymphoma who is currently undergoing salvage chemotherapy with ICE status post 4 cycles. He completed cycle #4 during this admission. We will discharge him home today with Neulasta injection at the [facility] tomorrow. He will have repeat PET scan on 05/29/2014 with evaluation by Dr. Evette Doffing at [facility] for Stem Cell Transplant on 05/31/2014. We will see him for follow up visit in 3 weeks.

## 2014-05-18 NOTE — Progress Notes (Signed)
Patient discharged to home, discharge instructions reviewed with patient who verbalized understanding. No new RX's for patient. Port a cath flushed, heparin & de accessed.

## 2014-05-19 ENCOUNTER — Ambulatory Visit (HOSPITAL_BASED_OUTPATIENT_CLINIC_OR_DEPARTMENT_OTHER): Payer: Commercial Managed Care - PPO

## 2014-05-19 ENCOUNTER — Encounter: Payer: Self-pay | Admitting: Internal Medicine

## 2014-05-19 ENCOUNTER — Other Ambulatory Visit: Payer: Self-pay | Admitting: Medical Oncology

## 2014-05-19 VITALS — BP 103/70 | HR 120 | Temp 97.7°F

## 2014-05-19 DIAGNOSIS — C8120 Mixed cellularity classical Hodgkin lymphoma, unspecified site: Secondary | ICD-10-CM | POA: Diagnosis not present

## 2014-05-19 DIAGNOSIS — Z5189 Encounter for other specified aftercare: Secondary | ICD-10-CM | POA: Diagnosis not present

## 2014-05-19 MED ORDER — PEGFILGRASTIM INJECTION 6 MG/0.6ML ~~LOC~~
6.0000 mg | PREFILLED_SYRINGE | Freq: Once | SUBCUTANEOUS | Status: AC
  Administered 2014-05-19: 6 mg via SUBCUTANEOUS
  Filled 2014-05-19: qty 0.6

## 2014-05-19 NOTE — Telephone Encounter (Signed)
Pt scheduled for neulasta today and he confirmed appt today at 1445

## 2014-05-19 NOTE — Patient Instructions (Signed)
Pegfilgrastim injection What is this medicine? PEGFILGRASTIM (peg fil GRA stim) is a long-acting granulocyte colony-stimulating factor that stimulates the growth of neutrophils, a type of white blood cell important in the body's fight against infection. It is used to reduce the incidence of fever and infection in patients with certain types of cancer who are receiving chemotherapy that affects the bone marrow. This medicine may be used for other purposes; ask your health care provider or pharmacist if you have questions. COMMON BRAND NAME(S): Neulasta What should I tell my health care provider before I take this medicine? They need to know if you have any of these conditions: -latex allergy -ongoing radiation therapy -sickle cell disease -skin reactions to acrylic adhesives (On-Body Injector only) -an unusual or allergic reaction to pegfilgrastim, filgrastim, other medicines, foods, dyes, or preservatives -pregnant or trying to get pregnant -breast-feeding How should I use this medicine? This medicine is for injection under the skin. If you get this medicine at home, you will be taught how to prepare and give the pre-filled syringe or how to use the On-body Injector. Refer to the patient Instructions for Use for detailed instructions. Use exactly as directed. Take your medicine at regular intervals. Do not take your medicine more often than directed. It is important that you put your used needles and syringes in a special sharps container. Do not put them in a trash can. If you do not have a sharps container, call your pharmacist or healthcare provider to get one. Talk to your pediatrician regarding the use of this medicine in children. Special care may be needed. Overdosage: If you think you have taken too much of this medicine contact a poison control center or emergency room at once. NOTE: This medicine is only for you. Do not share this medicine with others. What if I miss a dose? It is  important not to miss your dose. Call your doctor or health care professional if you miss your dose. If you miss a dose due to an On-body Injector failure or leakage, a new dose should be administered as soon as possible using a single prefilled syringe for manual use. What may interact with this medicine? Interactions have not been studied. Give your health care provider a list of all the medicines, herbs, non-prescription drugs, or dietary supplements you use. Also tell them if you smoke, drink alcohol, or use illegal drugs. Some items may interact with your medicine. This list may not describe all possible interactions. Give your health care provider a list of all the medicines, herbs, non-prescription drugs, or dietary supplements you use. Also tell them if you smoke, drink alcohol, or use illegal drugs. Some items may interact with your medicine. What should I watch for while using this medicine? You may need blood work done while you are taking this medicine. If you are going to need a MRI, CT scan, or other procedure, tell your doctor that you are using this medicine (On-Body Injector only). What side effects may I notice from receiving this medicine? Side effects that you should report to your doctor or health care professional as soon as possible: -allergic reactions like skin rash, itching or hives, swelling of the face, lips, or tongue -dizziness -fever -pain, redness, or irritation at site where injected -pinpoint red spots on the skin -shortness of breath or breathing problems -stomach or side pain, or pain at the shoulder -swelling -tiredness -trouble passing urine Side effects that usually do not require medical attention (report to your doctor   or health care professional if they continue or are bothersome): -bone pain -muscle pain This list may not describe all possible side effects. Call your doctor for medical advice about side effects. You may report side effects to FDA at  1-800-FDA-1088. Where should I keep my medicine? Keep out of the reach of children. Store pre-filled syringes in a refrigerator between 2 and 8 degrees C (36 and 46 degrees F). Do not freeze. Keep in carton to protect from light. Throw away this medicine if it is left out of the refrigerator for more than 48 hours. Throw away any unused medicine after the expiration date. NOTE: This sheet is a summary. It may not cover all possible information. If you have questions about this medicine, talk to your doctor, pharmacist, or health care provider.  2015, [employer]. (2013-03-31 16:14:05)  

## 2014-05-24 ENCOUNTER — Other Ambulatory Visit: Payer: Self-pay | Admitting: Cardiology

## 2014-05-24 NOTE — Telephone Encounter (Signed)
Pt is being closely monitored by oncology, refill request completed from last OV note 02/01/13 from Dr Aundra Dubin.

## 2014-05-29 ENCOUNTER — Ambulatory Visit (HOSPITAL_COMMUNITY)
Admission: RE | Admit: 2014-05-29 | Discharge: 2014-05-29 | Disposition: A | Discharge status: Home / Self Care | Payer: Commercial Managed Care - PPO | Source: Ambulatory Visit | Attending: Physician Assistant | Admitting: Physician Assistant

## 2014-05-29 DIAGNOSIS — C8190 Hodgkin lymphoma, unspecified, unspecified site: Secondary | ICD-10-CM | POA: Insufficient documentation

## 2014-05-29 DIAGNOSIS — C8120 Mixed cellularity classical Hodgkin lymphoma, unspecified site: Secondary | ICD-10-CM

## 2014-05-29 LAB — GLUCOSE, CAPILLARY: Glucose-Capillary: 206 mg/dL — ABNORMAL HIGH (ref 65–99)

## 2014-05-29 MED ORDER — FLUDEOXYGLUCOSE F - 18 (FDG) INJECTION
10.2000 | Freq: Once | INTRAVENOUS | Status: AC | PRN
  Administered 2014-05-29: 10.2 via INTRAVENOUS

## 2014-06-07 ENCOUNTER — Encounter: Payer: Self-pay | Admitting: Internal Medicine

## 2014-06-08 ENCOUNTER — Ambulatory Visit: Payer: Commercial Managed Care - PPO | Admitting: Internal Medicine

## 2014-06-08 ENCOUNTER — Other Ambulatory Visit: Payer: Commercial Managed Care - PPO

## 2014-06-08 ENCOUNTER — Encounter: Payer: Self-pay | Admitting: Internal Medicine

## 2014-06-09 ENCOUNTER — Other Ambulatory Visit: Payer: Self-pay | Admitting: Medical Oncology

## 2014-06-14 ENCOUNTER — Encounter: Payer: Self-pay | Admitting: Medical Oncology

## 2014-06-16 ENCOUNTER — Other Ambulatory Visit: Payer: Self-pay | Admitting: Medical Oncology

## 2014-06-28 ENCOUNTER — Ambulatory Visit: Payer: Commercial Managed Care - PPO | Admitting: Internal Medicine

## 2014-08-02 ENCOUNTER — Telehealth: Payer: Self-pay | Admitting: Internal Medicine

## 2014-08-02 NOTE — Telephone Encounter (Signed)
Returned call to Hamilton Capri at [place] [phone] and lvm with pt new appt

## 2014-08-03 ENCOUNTER — Telehealth: Payer: Self-pay | Admitting: Medical Oncology

## 2014-08-03 NOTE — Telephone Encounter (Signed)
Rhonda notified of next appt withMohamed.  -no labs needed . [facility] will draw them.

## 2014-08-14 ENCOUNTER — Encounter: Payer: Self-pay | Admitting: Physician Assistant

## 2014-08-14 ENCOUNTER — Ambulatory Visit (HOSPITAL_BASED_OUTPATIENT_CLINIC_OR_DEPARTMENT_OTHER): Payer: Commercial Managed Care - PPO | Admitting: Physician Assistant

## 2014-08-14 VITALS — BP 131/83 | HR 113 | Temp 98.7°F | Resp 20 | Ht 70.0 in | Wt 206.1 lb

## 2014-08-14 DIAGNOSIS — C8190 Hodgkin lymphoma, unspecified, unspecified site: Secondary | ICD-10-CM

## 2014-08-14 DIAGNOSIS — C8120 Mixed cellularity classical Hodgkin lymphoma, unspecified site: Secondary | ICD-10-CM

## 2014-08-14 NOTE — Patient Instructions (Signed)
Continue your close follow-up under the care of Dr. Marland Kitchen a [facility] as scheduled as well as the follow-up I related to your clinical research trial participation Follow-up with our office on an as-needed basis.

## 2014-08-14 NOTE — Progress Notes (Addendum)
[name] [name]   [phone]  OFFICE PROGRESS NOTE  Pcp Not In System No address on file  DIAGNOSIS: Recurrent Hodgkin's lymphoma initially diagnosed as Stage III classical Hodgkin's lymphoma, mixed cellularity subtype diagnosed in December 2014.  PRIOR THERAPY: Status post 6 cycles of systemic chemotherapy with ABVD complicated with pulmonary fibrosis, last dose was given 07/14/2013  CURRENT THERAPY:  1. Salvage chemotherapy with ICE (ifosfamide/mesna, carboplatin and etoposide) first dose was given 03/06/2014. Status post 4 cycles. 2. Status post autologous stem cell transplant on 07/18/2014 under the care of Dr. Evette Doffing at [facility]. 3. Status post CAR-T-cell infusion on 08/04/2014 as part of a clinical research trial at [place]: [name] [name] 49 y.o. male returns to the clinic today for follow-up visit.  He states that he had a rough time with this treatment but did ultimately tolerate his stem cell transplant and T-cell infusion. He has had some fatigue but this continues to improve. The patient is feeling fine today was no specific complaints. He is currently being followed closely at [facility] by Dr. Evette Doffing for his recent attire autologous stem cell transplant as well as follow-up per protocol regarding the CAR-T-cell infusion. He has a follow-up appointment related to the clinical trial on 08/15/2014. Laboratory studies drawn at [facility] on 08/09/2014 revealed a total white count 7.9, hemoglobin of 10.8, platelets of 320,000. Chemistries were within normal range with the exception of a glucose of 187 and an AST of 80. He denied having any significant weight loss or night sweats. The patient denied having any chest pain, shortness of breath, cough or hemoptysis. He has no nausea or vomiting.   MEDICAL HISTORY: Past Medical History  Diagnosis Date  . Hypertension   .  Diabetes mellitus without complication     Pre-diabetes  . Depression   . Headache(784.0)   . Thyroid disease     Hx: of  . Cancer 2015    cervical lymphadenopathy  . Hodgkin lymphoma 2015  . Hepatic artery aneurysm 2015    ALLERGIES:  has No Known Allergies.  MEDICATIONS:  Current Outpatient Prescriptions  Medication Sig Dispense Refill  . eszopiclone (LUNESTA) 2 MG TABS tablet Take 2 mg by mouth at bedtime as needed for sleep. Take immediately before bedtime    . levothyroxine (SYNTHROID, LEVOTHROID) 200 MCG tablet Take 200 mcg by mouth daily before breakfast.    . lisinopril (PRINIVIL,ZESTRIL) 10 MG tablet Take 10 mg by mouth daily with breakfast.     . loperamide (IMODIUM) 2 MG capsule Take 2 mg by mouth.    . lovastatin (ALTOPREV) 40 MG 24 hr tablet Take 40 mg by mouth at bedtime.    . magnesium oxide (MAGOX 400) 400 (241.3 MG) MG tablet Take 800 mg by mouth.    . metFORMIN (GLUCOPHAGE) 500 MG tablet Take 500 mg by mouth 2 (two) times daily.     Marland Kitchen OVER THE COUNTER MEDICATION Take 2.5 mLs by mouth daily. *Isotonix Antioxidant Powder*  Mixes with water    . oxyCODONE (OXY IR/ROXICODONE) 5 MG immediate release tablet Take 1 tablet (5 mg total) by mouth every 6 (six) hours as needed for severe pain. 60 tablet 0  . oxyCODONE (OXY IR/ROXICODONE) 5 MG immediate release tablet Take 5 mg by mouth.    . pantoprazole (PROTONIX) 40 MG tablet Take 40 mg by mouth.    . potassium chloride (  K-DUR,KLOR-CON) 10 MEQ tablet Take 10 mEq by mouth daily.     . Potassium Chloride ER 20 MEQ TBCR Take by mouth.    . prochlorperazine (COMPAZINE) 10 MG tablet Take 1 tablet (10 mg total) by mouth every 6 (six) hours as needed for nausea or vomiting. (Patient taking differently: Take 10 mg by mouth daily as needed for nausea or vomiting. ) 30 tablet 1  . prochlorperazine (COMPAZINE) 10 MG tablet Take 10 mg by mouth.    . sertraline (ZOLOFT) 100 MG tablet Take 100 mg by mouth daily with breakfast.     .  sulfamethoxazole-trimethoprim (BACTRIM DS,SEPTRA DS) 800-160 MG per tablet Take 1 tab BID Monday/Thursday only.  Do not start until told in clinic.    . valACYclovir (VALTREX) 500 MG tablet Take 500 mg by mouth.    Marland Kitchen allopurinol (ZYLOPRIM) 100 MG tablet Take 1 tablet (100 mg total) by mouth 2 (two) times daily. (Patient not taking: Reported on 08/14/2014) 60 tablet 3  . Azelastine-Fluticasone 137-50 MCG/ACT SUSP Place 1 spray into the nose 2 (two) times daily.    . carvedilol (COREG) 6.25 MG tablet TAKE ONE TABLET BY MOUTH TWICE DAILY (Patient not taking: Reported on 08/14/2014) 60 tablet 0  . carvedilol (COREG) 6.25 MG tablet TAKE ONE TABLET BY MOUTH TWICE DAILY (Patient not taking: Reported on 08/14/2014) 60 tablet 6  . fenofibrate (TRICOR) 145 MG tablet TAKE ONE TABLET BY MOUTH ONCE DAILY (Patient not taking: Reported on 08/14/2014) 30 tablet 1  . fexofenadine-pseudoephedrine (ALLEGRA-D 24) 180-240 MG per 24 hr tablet Take 1 tablet by mouth daily.     No current facility-administered medications for this visit.    SURGICAL HISTORY:  Past Surgical History  Procedure Laterality Date  . Tonsillectomy    . Mass excision Left 12/31/2012    Procedure: CERVICAL LYMPH NODE BIOPSY;  Surgeon: [name], MD;  Location: [place];  Service: General;  Laterality: Left;    REVIEW OF SYSTEMS:  Constitutional: positive for fatigue Eyes: negative Ears, nose, mouth, throat, and face: negative Respiratory: negative Cardiovascular: negative Gastrointestinal: negative Genitourinary:negative Integument/breast: negative Hematologic/lymphatic: negative Musculoskeletal:negative Neurological: negative Behavioral/Psych: negative Endocrine: negative Allergic/Immunologic: negative   PHYSICAL EXAMINATION: General appearance: alert, cooperative and no distress Head: Normocephalic, without obvious abnormality, atraumatic Neck: no JVD, supple, symmetrical, trachea midline and thyroid not enlarged, symmetric, no  tenderness/mass/nodules Lymph nodes: Cervical, supraclavicular, and axillary nodes normal. Resp: clear to auscultation bilaterally Back: symmetric, no curvature. ROM normal. No CVA tenderness. Cardio: regular rate and rhythm, S1, S2 normal, no murmur, click, rub or gallop GI: soft, non-tender; bowel sounds normal; no masses,  no organomegaly Extremities: extremities normal, atraumatic, no cyanosis or edema Neurologic: Alert and oriented X 3, normal strength and tone. Normal symmetric reflexes. Normal coordination and gait  ECOG PERFORMANCE STATUS: 0 - Asymptomatic  Blood pressure 131/83, pulse 113, temperature 98.7 F (37.1 C), temperature source Oral, resp. rate 20, height 5\' 10"  (1.778 m), weight 206 lb 1.6 oz (93.486 kg), SpO2 95 %.  LABORATORY DATA: Lab Results  Component Value Date   WBC 9.9 05/15/2014   HGB 9.2* 05/15/2014   HCT 27.0* 05/15/2014   MCV 97.8 05/15/2014   PLT 254 05/15/2014      Chemistry      Component Value Date/Time   NA 133* 05/15/2014 1043   NA 136 05/12/2014 1131   K 4.5 05/15/2014 1043   K 4.5 05/12/2014 1131   CL 97* 05/15/2014 1043   CO2 26 05/15/2014 1043  CO2 22 05/12/2014 1131   BUN 21* 05/15/2014 1043   BUN 9.7 05/12/2014 1131   CREATININE 1.34* 05/15/2014 1043   CREATININE 1.0 05/12/2014 1131      Component Value Date/Time   CALCIUM 9.5 05/15/2014 1043   CALCIUM 9.3 05/12/2014 1131   ALKPHOS 87 05/15/2014 1043   ALKPHOS 120 05/12/2014 1131   AST 35 05/15/2014 1043   AST 41* 05/12/2014 1131   ALT 45 05/15/2014 1043   ALT 59* 05/12/2014 1131   BILITOT 0.8 05/15/2014 1043   BILITOT 0.46 05/12/2014 1131       RADIOGRAPHIC STUDIES: No results found. ASSESSMENT AND PLAN: This is a very pleasant 49 years old white male with history of stage III Hodgkin's lymphoma status post 6 cycles of systemic chemotherapy with ABVD with almost complete response. He is status post 4 cycles of salvage chemotherapy with ICE.  Status post autologous  stem cell transplant on 07/18/2014 under the care of Dr. Evette Doffing at [facility].  Status post CAR-T-cell infusion on 08/04/2014 as part of a clinical research trial at [facility] The patient was discussed with and also seen by Dr. Julien Nordmann. We will plan to see him on an as-needed basis as he will be closely monitored by Dr. Evette Doffing at [facility] post autologous stem cell transplant in addition to the follow-up as outlined in the clinical research trial that he is participating in.  He was advised to call immediately if he has any concerning symptoms in the interval. The patient voices understanding of current disease status and treatment options and is in agreement with the current care plan.  All questions were answered. The patient knows to call the clinic with any problems, questions or concerns. We can certainly see the patient much sooner if necessary.  Carlton Adam, PA-C 08/14/2014  ADDENDUM: Hematology/Oncology Attending: I had a face to face encounter with the patient. I recommended his care plan. This is a very pleasant 49 years old white male with recurrent Hodgkin's lymphoma status post treatment with salvage chemotherapy with ICE for 4 cycles followed by peripheral blood autologous stem cell transplant on 07/18/2014 and the patient is also status post CAR-T cell infusion on 08/04/2014 as part of clinical trial at [facility]. He is feeling fine today with no specific complaints except for fatigue. I recommended for the patient to continue his routine follow-up visit with the stem cell transplant team at [facility]. We will see him on as-needed basis at this point until he is released completely to [place] from the transplant team. He was advised to call immediately if he has any concerning symptoms in the interval.  Disclaimer: This note was dictated with voice recognition software. Similar sounding words can inadvertently be transcribed and may  be missed upon review. Eilleen Kempf., MD 08/14/2014

## 2014-08-29 ENCOUNTER — Other Ambulatory Visit (HOSPITAL_COMMUNITY): Payer: Self-pay | Admitting: Interventional Radiology

## 2014-08-29 DIAGNOSIS — I728 Aneurysm of other specified arteries: Secondary | ICD-10-CM

## 2014-09-14 ENCOUNTER — Encounter: Payer: Self-pay | Admitting: Radiology

## 2014-09-19 ENCOUNTER — Ambulatory Visit (INDEPENDENT_AMBULATORY_CARE_PROVIDER_SITE_OTHER): Payer: Commercial Managed Care - PPO | Admitting: Cardiology

## 2014-09-19 ENCOUNTER — Encounter: Payer: Self-pay | Admitting: Cardiology

## 2014-09-19 VITALS — BP 122/90 | HR 79 | Ht 69.0 in | Wt 214.8 lb

## 2014-09-19 DIAGNOSIS — Z87898 Personal history of other specified conditions: Secondary | ICD-10-CM | POA: Diagnosis not present

## 2014-09-19 DIAGNOSIS — E785 Hyperlipidemia, unspecified: Secondary | ICD-10-CM

## 2014-09-19 DIAGNOSIS — I10 Essential (primary) hypertension: Secondary | ICD-10-CM | POA: Diagnosis not present

## 2014-09-19 DIAGNOSIS — Z9189 Other specified personal risk factors, not elsewhere classified: Secondary | ICD-10-CM

## 2014-09-19 MED ORDER — FENOFIBRATE 145 MG PO TABS: 145.0000 mg | ORAL_TABLET | Freq: Every day | ORAL | Status: DC

## 2014-09-19 NOTE — Patient Instructions (Addendum)
Medication Instructions:  Your physician recommends that you continue on your current medications as directed. Please refer to the Current Medication list given to you today.  A refill of the Fenofibrate has been sent to your local pharmacy.  Labwork: None ordered  Testing/Procedures: None ordered  Follow-Up: Your physician wants you to follow-up in: [place]. [name] [name].  You will receive a reminder letter in the mail two months in advance. If you don't receive a letter, please call our office to schedule the follow-up appointment.

## 2014-09-19 NOTE — Progress Notes (Signed)
Cardiology Office Note   Date:  09/19/2014   ID:  [name] [name], DOB 1965-03-12, MRN [id number]  PCP:  Pcp Not In System  Cardiologist:  Dr. Aundra Dubin    No chief complaint on file.     History of Present Illness: [name] [name] is a 49 y.o. male who presents for history of HTN, DM2, hyperlipidemia and with Hodgkins disease presents for cardiology evaluation today due to elevated triglycerides. Has been treated Status post 6 cycles of systemic chemotherapy with ABVD complicated with pulmonary fibrosis, last dose was given 07/14/2013.  He had recurrent Hodgkins and current therapy of Salvage chemotherapy with ICE (ifosfamide/mesna, carboplatin and etoposide) first dose was given 03/06/2014. Status post 4 cycles.    Status post autologous stem cell transplant on 07/18/2014 under the care of Dr. Evette Doffing at [facility]. Status post CAR-T-cell infusion on 08/04/2014 as part of a clinical research trial at [facility]     He had a baseline echo showing EF 50-55% with grade I diastolic dysfunction.   With stem cell transplant he came off of his coreg and lisinopril due to hypotension.  Recent labs with elevated TG to 880.  He has been out of fenofibrate and his lovastatin has just been restarted.    He had an Echo at [facility] 06/07/14 with LVH EF 55-60% . He had mild MR mild AR.   Patient reports no exertional chest pain or dyspnea. BP has been well-controlled, and diabetes has been reasonably controlled as well. He has a large left hepatic artery aneurysm.   Past Medical History  Diagnosis Date  . Hypertension   . Diabetes mellitus without complication     Pre-diabetes  . Depression   . Headache(784.0)   . Thyroid disease     Hx: of  . Cancer 2015    cervical lymphadenopathy  . Hodgkin lymphoma 2015  . Hepatic artery aneurysm 2015    Past Surgical History  Procedure Laterality Date  . Tonsillectomy    . Mass excision Left 12/31/2012    Procedure:  CERVICAL LYMPH NODE BIOPSY;  Surgeon: [name], MD;  Location: [place];  Service: General;  Laterality: Left;     Current Outpatient Prescriptions  Medication Sig Dispense Refill  . Azelastine-Fluticasone 137-50 MCG/ACT SUSP Place 1 spray into the nose 2 (two) times daily.    . eszopiclone (LUNESTA) 2 MG TABS tablet Take 2 mg by mouth at bedtime as needed for sleep. Take immediately before bedtime    . levothyroxine (SYNTHROID, LEVOTHROID) 200 MCG tablet Take 200 mcg by mouth daily before breakfast.    . loperamide (IMODIUM) 2 MG capsule Take 2 mg by mouth as needed for diarrhea or loose stools (daily).     Marland Kitchen loratadine (CLARITIN) 10 MG tablet Take 10 mg by mouth daily.    Marland Kitchen lovastatin (ALTOPREV) 40 MG 24 hr tablet Take 40 mg by mouth at bedtime.    . metFORMIN (GLUCOPHAGE) 500 MG tablet Take 500 mg by mouth 2 (two) times daily.     Marland Kitchen OVER THE COUNTER MEDICATION Take 2.5 mLs by mouth daily. *Isotonix Antioxidant Powder*  Mixes with water    . oxyCODONE (OXY IR/ROXICODONE) 5 MG immediate release tablet Take 1 tablet (5 mg total) by mouth every 6 (six) hours as needed for severe pain. 60 tablet 0  . sertraline (ZOLOFT) 100 MG tablet Take 100 mg by mouth daily with breakfast.     . valACYclovir (VALTREX) 500 MG  tablet Take 500 mg by mouth daily.     . fenofibrate (TRICOR) 145 MG tablet TAKE ONE TABLET BY MOUTH ONCE DAILY (Patient not taking: Reported on 08/14/2014) 30 tablet 1   No current facility-administered medications for this visit.    Allergies:   Review of patient's allergies indicates no known allergies.    Social History:  The patient  reports that he quit smoking about 2 years ago. His smoking use included Cigarettes. He has a 31 pack-year smoking history. He has never used smokeless tobacco. He reports that he does not drink alcohol or use illicit drugs.   Family History:  The patient's family history includes AAA (abdominal aortic aneurysm) in his mother; Aneurysm (age of  onset: 65) in his mother; Bone cancer (age of onset: 34) in his maternal uncle; Cancer in his brother; Diabetes in his father; Heart disease in his mother; Hodgkin's lymphoma in his father; Obesity in his mother.    ROS:  General:no colds or fevers, + weight increase Skin:no rashes or ulcers HEENT:no blurred vision, no congestion CV:see HPI PUL:see HPI GI:no diarrhea constipation or melena, no indigestion GU:no hematuria, no dysuria MS:no joint pain, no claudication Neuro:no syncope, no lightheadedness Endo:+ diabetes with improved glucose, no thyroid disease  Wt Readings from Last 3 Encounters:  09/19/14 214 lb 12.8 oz (97.433 kg)  08/14/14 206 lb 1.6 oz (93.486 kg)  05/18/14 208 lb 5.4 oz (94.5 kg)     PHYSICAL EXAM: VS:  BP 122/90 mmHg  Pulse 79  Ht 5\' 9"  (1.753 m)  Wt 214 lb 12.8 oz (97.433 kg)  BMI 31.71 kg/m2 , BMI Body mass index is 31.71 kg/(m^2). General:Pleasant affect, NAD Skin:Warm and dry, brisk capillary refill HEENT:normocephalic, sclera clear, mucus membranes moist Neck:supple, no JVD, no bruits  Heart:S1S2 RRR without murmur, gallup, rub or click Lungs:clear without rales, rhonchi, or wheezes FOY:DXAJ, non tender, + BS, do not palpate liver spleen or masses Ext:no lower ext edema, 2+ pedal pulses, 2+ radial pulses Neuro:alert and oriented, MAE, follows commands, + facial symmetry    EKG:  EKG is ordered today. The ekg ordered today demonstrates SR rate 79 no acute changes.    Recent Labs: 05/15/2014: ALT 45; BUN 21*; Creatinine, Ser 1.34*; Hemoglobin 9.2*; Platelets 254; Potassium 4.5; Sodium 133*    Lipid Panel No results found for: CHOL, TRIG, HDL, CHOLHDL, VLDL, LDLCALC, LDLDIRECT     Other studies Reviewed: Additional studies/ records that were reviewed today include: echo from [facility] and labs from [facility].    ASSESSMENT AND PLAN:  1.  hypertriglyceridemia at 880, resume fenofibrate, we reviewed diet.  + FH CAD.  We will recheck  lipids in 6 weeks.  He continues to follow up with [facility] and oncology  He will follow up with Dr. Aundra Dubin 01/2015.  2.  HTN currently stable.  3. Hepatic artery aneurysm with endovascular repair on February 04, 2013 .     Current medicines are reviewed with the patient today.  The patient Has no concerns regarding medicines.  The following changes have been made:  See above Labs/ tests ordered today include:see above  Disposition:   FU:  see above  Signed, Isaiah Serge, NP  09/19/2014 11:59 AM    [facility] [place], [place], [place], [place] Phone: [phone]; Fax: [phone]

## 2014-09-26 ENCOUNTER — Ambulatory Visit: Payer: Commercial Managed Care - PPO | Admitting: Pharmacist

## 2014-11-13 ENCOUNTER — Encounter: Payer: Self-pay | Admitting: Cardiology

## 2014-11-30 ENCOUNTER — Telehealth: Payer: Self-pay | Admitting: Radiology

## 2014-11-30 NOTE — Telephone Encounter (Signed)
Left message on voice mail:  Dr Laurence Ferrari is out of the office this week.  We will check with him next week to determine if imaging needs to be ordered prior to follow up appointment.  Will call patient back to schedule.  Ryszard Socarras Riki Rusk, RN 11/30/2014 9:52 AM

## 2014-12-26 ENCOUNTER — Encounter (HOSPITAL_COMMUNITY): Payer: Self-pay

## 2014-12-26 ENCOUNTER — Other Ambulatory Visit: Payer: Self-pay | Admitting: Interventional Radiology

## 2014-12-26 ENCOUNTER — Ambulatory Visit
Admission: RE | Admit: 2014-12-26 | Discharge: 2014-12-26 | Disposition: A | Discharge status: Home / Self Care | Payer: Commercial Managed Care - PPO | Source: Ambulatory Visit | Attending: Interventional Radiology | Admitting: Interventional Radiology

## 2014-12-26 ENCOUNTER — Other Ambulatory Visit (HOSPITAL_COMMUNITY): Payer: Self-pay | Admitting: Interventional Radiology

## 2014-12-26 ENCOUNTER — Ambulatory Visit (HOSPITAL_COMMUNITY)
Admission: RE | Admit: 2014-12-26 | Discharge: 2014-12-26 | Disposition: A | Discharge status: Home / Self Care | Payer: Commercial Managed Care - PPO | Source: Ambulatory Visit | Attending: Interventional Radiology | Admitting: Interventional Radiology

## 2014-12-26 DIAGNOSIS — M488X6 Other specified spondylopathies, lumbar region: Secondary | ICD-10-CM | POA: Diagnosis not present

## 2014-12-26 DIAGNOSIS — I728 Aneurysm of other specified arteries: Secondary | ICD-10-CM | POA: Diagnosis present

## 2014-12-26 DIAGNOSIS — Z09 Encounter for follow-up examination after completed treatment for conditions other than malignant neoplasm: Secondary | ICD-10-CM | POA: Diagnosis present

## 2014-12-26 DIAGNOSIS — R590 Localized enlarged lymph nodes: Secondary | ICD-10-CM | POA: Diagnosis not present

## 2014-12-26 DIAGNOSIS — K760 Fatty (change of) liver, not elsewhere classified: Secondary | ICD-10-CM | POA: Diagnosis not present

## 2014-12-26 DIAGNOSIS — C8591 Non-Hodgkin lymphoma, unspecified, lymph nodes of head, face, and neck: Secondary | ICD-10-CM

## 2014-12-26 MED ORDER — IOHEXOL 350 MG/ML SOLN
100.0000 mL | Freq: Once | INTRAVENOUS | Status: AC | PRN
Start: 2014-12-26 — End: 2014-12-26
  Administered 2014-12-26: 100 mL via INTRAVENOUS

## 2014-12-26 NOTE — Progress Notes (Signed)
Subjective: Follow up CTA of hepatic artery aneurysm Pt doing well. Has completed treatment for lymphoma in the interim, though he remains in a trial with [facility], which requires blood work only. He still has his port, which was used today for his CTA, but he would likel to talk about having it removed. He feels well otherwise.   Allergies: Review of patient's allergies indicates no known allergies.  Medications: Prior to Admission medications   Medication Sig Start Date End Date Taking? Authorizing Provider  Azelastine-Fluticasone 137-50 MCG/ACT SUSP Place 1 spray into the nose 2 (two) times daily.   Yes Historical Provider, MD  eszopiclone (LUNESTA) 2 MG TABS tablet Take 2 mg by mouth at bedtime as needed for sleep. Take immediately before bedtime   Yes Historical Provider, MD  fenofibrate (TRICOR) 145 MG tablet Take 1 tablet (145 mg total) by mouth daily. 09/19/14  Yes Isaiah Serge, NP  fexofenadine (ALLEGRA) 180 MG tablet Take 180 mg by mouth daily.   Yes Historical Provider, MD  fexofenadine-pseudoephedrine (ALLEGRA-D 24) 180-240 MG 24 hr tablet Take 1 tablet by mouth daily as needed.   Yes Historical Provider, MD  levothyroxine (SYNTHROID, LEVOTHROID) 200 MCG tablet Take 200 mcg by mouth daily before breakfast.   Yes Historical Provider, MD  loperamide (IMODIUM) 2 MG capsule Take 2 mg by mouth as needed for diarrhea or loose stools (daily).  07/28/14  Yes Historical Provider, MD  lovastatin (ALTOPREV) 40 MG 24 hr tablet Take 40 mg by mouth at bedtime.   Yes Historical Provider, MD  metFORMIN (GLUCOPHAGE) 500 MG tablet Take 500 mg by mouth 2 (two) times daily.  07/28/14  Yes Historical Provider, MD  OVER THE COUNTER MEDICATION Take 2.5 mLs by mouth daily. *Isotonix Antioxidant Powder*  Mixes with water   Yes Historical Provider, MD  sertraline (ZOLOFT) 100 MG tablet Take 100 mg by mouth daily with breakfast.    Yes Historical Provider, MD  valACYclovir (VALTREX) 500 MG tablet Take 500  mg by mouth daily.  07/28/14 07/28/15 Yes Historical Provider, MD  loratadine (CLARITIN) 10 MG tablet Take 10 mg by mouth daily.    Historical Provider, MD  oxyCODONE (OXY IR/ROXICODONE) 5 MG immediate release tablet Take 1 tablet (5 mg total) by mouth every 6 (six) hours as needed for severe pain. Patient not taking: Reported on 12/26/2014 03/16/14   [name], MD     Vital Signs: BP 149/104 mmHg  Pulse 97  Temp(Src) 98.4 F (36.9 C) (Oral)  Resp 15  SpO2 95%  Physical Exam  Constitutional: He appears well-developed and well-nourished. No distress.  HENT:  Head: Normocephalic.  Cardiovascular: Normal rate, regular rhythm and normal heart sounds.   Pulmonary/Chest: Effort normal and breath sounds normal. No respiratory distress.  (R)chest port site clean. NO signs of infection  Abdominal: Soft. He exhibits no distension and no mass. There is no tenderness.  Neurological: He is alert.  Psychiatric: He has a normal mood and affect. Judgment normal.    Imaging: No results found.  Labs:  CBC:  Recent Labs  04/26/14 0402 04/27/14 0620 05/12/14 1130 05/15/14 1043  WBC 10.6* 5.1 14.4* 9.9  HGB 9.5* 8.9* 9.5* 9.2*  HCT 27.2* 25.8* 28.4* 27.0*  PLT 299 231 270 254    COAGS: No results for input(s): INR, APTT in the last 8760 hours.  BMP:  Recent Labs  04/25/14 0610 04/26/14 0402 04/27/14 0620 05/01/14 0805 05/12/14 1131 05/15/14 1043  NA 134* 134* 133* 133*  136 133*  K 4.0 4.1 3.9 4.4 4.5 4.5  CL 100 98 101  --   --  97*  CO2 25 24 26  17* 22 26  GLUCOSE 227* 246* 153* 252* 193* 187*  BUN 17 18 18  20.3 9.7 21*  CALCIUM 9.4 9.1 9.1 9.6 9.3 9.5  CREATININE 0.97 0.97 0.95 1.2 1.0 1.34*  GFRNONAA >90 >90 >90  --   --  >60  GFRAA >90 >90 >90  --   --  >60    LIVER FUNCTION TESTS:  Recent Labs  04/27/14 0620 05/01/14 0805 05/12/14 1131 05/15/14 1043  BILITOT 0.5 0.44 0.46 0.8  AST 27 24 41* 35  ALT 37 36 59* 45  ALKPHOS 62 130 120 87  PROT 7.2 8.1  7.6 8.1  ALBUMIN 3.7 4.2 4.0 4.4    Assessment and Plan: Hx hepatic artery aneurysm s/p prior embolization in 1/15. CTA today looks good, no flow in aneurysm sac, which is also smaller in size. Lymphoma, treatment completed, good report on last PET Will scheduled port removal in next 1-2 weeks.   SignedAscencion Dike 12/26/2014, 10:18 AM   I spent a total of 15 Minutes at the the patient's bedside AND on the patient's hospital floor or unit, greater than 50% of which was counseling/coordinating care for hepatic artery aneurysm and port care.

## 2015-01-02 ENCOUNTER — Other Ambulatory Visit: Payer: Self-pay | Admitting: Radiology

## 2015-01-03 ENCOUNTER — Ambulatory Visit (HOSPITAL_COMMUNITY)
Admission: RE | Admit: 2015-01-03 | Discharge: 2015-01-03 | Disposition: A | Discharge status: Home / Self Care | Payer: Commercial Managed Care - PPO | Source: Ambulatory Visit | Attending: Interventional Radiology | Admitting: Interventional Radiology

## 2015-01-03 ENCOUNTER — Encounter (HOSPITAL_COMMUNITY): Payer: Self-pay

## 2015-01-03 DIAGNOSIS — C8591 Non-Hodgkin lymphoma, unspecified, lymph nodes of head, face, and neck: Secondary | ICD-10-CM

## 2015-01-03 DIAGNOSIS — Z452 Encounter for adjustment and management of vascular access device: Secondary | ICD-10-CM | POA: Insufficient documentation

## 2015-01-03 DIAGNOSIS — Z8572 Personal history of non-Hodgkin lymphomas: Secondary | ICD-10-CM | POA: Insufficient documentation

## 2015-01-03 LAB — CBC WITH DIFFERENTIAL/PLATELET
BASOS ABS: 0 10*3/uL (ref 0.0–0.1)
BASOS PCT: 1 %
EOS ABS: 0.1 10*3/uL (ref 0.0–0.7)
EOS PCT: 2 %
HCT: 40.2 % (ref 39.0–52.0)
Hemoglobin: 13.8 g/dL (ref 13.0–17.0)
LYMPHS PCT: 31 %
Lymphs Abs: 1.8 10*3/uL (ref 0.7–4.0)
MCH: 31.6 pg (ref 26.0–34.0)
MCHC: 34.3 g/dL (ref 30.0–36.0)
MCV: 92 fL (ref 78.0–100.0)
Monocytes Absolute: 0.4 10*3/uL (ref 0.1–1.0)
Monocytes Relative: 7 %
Neutro Abs: 3.5 10*3/uL (ref 1.7–7.7)
Neutrophils Relative %: 59 %
PLATELETS: 252 10*3/uL (ref 150–400)
RBC: 4.37 MIL/uL (ref 4.22–5.81)
RDW: 13.6 % (ref 11.5–15.5)
WBC: 5.9 10*3/uL (ref 4.0–10.5)

## 2015-01-03 LAB — PROTIME-INR
INR: 1.15 (ref 0.00–1.49)
PROTHROMBIN TIME: 14.9 s (ref 11.6–15.2)

## 2015-01-03 MED ORDER — CEFAZOLIN SODIUM-DEXTROSE 2-3 GM-% IV SOLR
INTRAVENOUS | Status: AC
  Filled 2015-01-03: qty 50

## 2015-01-03 MED ORDER — CEFAZOLIN SODIUM-DEXTROSE 2-3 GM-% IV SOLR: 2.0000 g | Freq: Once | INTRAVENOUS | Status: DC

## 2015-01-03 MED ORDER — LIDOCAINE-EPINEPHRINE 2 %-1:100000 IJ SOLN
INTRAMUSCULAR | Status: AC
  Filled 2015-01-03: qty 1

## 2015-01-03 NOTE — Procedures (Signed)
Successful removal of right anterior chest wall port-a-cath. No immediate post procedural complications.    Jay Malory Spurr, MD Pager #: [phone]   

## 2015-01-03 NOTE — Discharge Instructions (Addendum)
Tissue Adhesive Wound Care Some cuts and wounds can be closed with tissue adhesive. Adhesive is like glue. It holds the skin together and helps a wound heal faster. This adhesive goes away on its own as the wound heals.  HOME CARE   Showers are allowed. Do not soak the wound in water. Do not take baths, swim, or use hot tubs. Do not use soaps or creams on your wound.  If a bandage (dressing) was put on, change it as often as told by your doctor.  Keep the bandage dry.  Do not scratch, pick, or rub the adhesive.  Do not put tape over the adhesive. The adhesive could come off.  Protect the wound from another injury.  Protect the wound from sun and tanning beds.  Only take medicine as told by your doctor.  Keep all doctor visits as told. GET HELP RIGHT AWAY IF:   Your wound is red, puffy (swollen), hot, or tender.  You get a rash after the glue is put on.  You have more pain in the wound.  You have a red streak going away from the wound.  You have yellowish-white fluid (pus) coming from the wound.  You have more bleeding.  You have a fever.  You have chills and start to shake.  You notice a bad smell coming from the wound.  Your wound or adhesive breaks open. MAKE SURE YOU:   Understand these instructions.  Will watch your condition.  Will get help right away if you are not doing well or get worse.   This information is not intended to replace advice given to you by your health care provider. Make sure you discuss any questions you have with your health care provider.   Document Released: 10/09/2007 Document Revised: 10/20/2012 Document Reviewed: 07/21/2012 Elsevier Interactive Patient Education [employer].

## 2015-02-07 ENCOUNTER — Other Ambulatory Visit: Payer: Self-pay | Admitting: Internal Medicine

## 2015-02-07 DIAGNOSIS — C8123 Mixed cellularity classical Hodgkin lymphoma, intra-abdominal lymph nodes: Secondary | ICD-10-CM

## 2015-02-08 ENCOUNTER — Telehealth: Payer: Self-pay | Admitting: Internal Medicine

## 2015-02-08 NOTE — Telephone Encounter (Signed)
S/w pt gave appt 1/30.

## 2015-02-12 ENCOUNTER — Ambulatory Visit (HOSPITAL_BASED_OUTPATIENT_CLINIC_OR_DEPARTMENT_OTHER): Payer: Commercial Managed Care - PPO | Admitting: Internal Medicine

## 2015-02-12 ENCOUNTER — Telehealth: Payer: Self-pay | Admitting: Internal Medicine

## 2015-02-12 ENCOUNTER — Encounter: Payer: Self-pay | Admitting: Internal Medicine

## 2015-02-12 ENCOUNTER — Other Ambulatory Visit (HOSPITAL_BASED_OUTPATIENT_CLINIC_OR_DEPARTMENT_OTHER): Payer: Commercial Managed Care - PPO

## 2015-02-12 VITALS — BP 129/96 | HR 110 | Temp 98.4°F | Resp 18 | Ht 69.0 in | Wt 217.9 lb

## 2015-02-12 DIAGNOSIS — K0889 Other specified disorders of teeth and supporting structures: Secondary | ICD-10-CM

## 2015-02-12 DIAGNOSIS — C8122 Mixed cellularity classical Hodgkin lymphoma, intrathoracic lymph nodes: Secondary | ICD-10-CM

## 2015-02-12 DIAGNOSIS — C8123 Mixed cellularity classical Hodgkin lymphoma, intra-abdominal lymph nodes: Secondary | ICD-10-CM

## 2015-02-12 HISTORY — DX: Other specified disorders of teeth and supporting structures: K08.89

## 2015-02-12 LAB — CBC WITH DIFFERENTIAL/PLATELET
BASO%: 0.7 % (ref 0.0–2.0)
BASOS ABS: 0 10*3/uL (ref 0.0–0.1)
EOS ABS: 0.1 10*3/uL (ref 0.0–0.5)
EOS%: 2.1 % (ref 0.0–7.0)
HEMATOCRIT: 40.6 % (ref 38.4–49.9)
HEMOGLOBIN: 13.8 g/dL (ref 13.0–17.1)
LYMPH#: 1.5 10*3/uL (ref 0.9–3.3)
LYMPH%: 22.2 % (ref 14.0–49.0)
MCH: 31.5 pg (ref 27.2–33.4)
MCHC: 34 g/dL (ref 32.0–36.0)
MCV: 92.6 fL (ref 79.3–98.0)
MONO#: 0.6 10*3/uL (ref 0.1–0.9)
MONO%: 8.3 % (ref 0.0–14.0)
NEUT%: 66.7 % (ref 39.0–75.0)
NEUTROS ABS: 4.5 10*3/uL (ref 1.5–6.5)
Platelets: 267 10*3/uL (ref 140–400)
RBC: 4.39 10*6/uL (ref 4.20–5.82)
RDW: 14.2 % (ref 11.0–14.6)
WBC: 6.8 10*3/uL (ref 4.0–10.3)

## 2015-02-12 LAB — COMPREHENSIVE METABOLIC PANEL
ALBUMIN: 4.1 g/dL (ref 3.5–5.0)
ALK PHOS: 82 U/L (ref 40–150)
ALT: 42 U/L (ref 0–55)
AST: 30 U/L (ref 5–34)
Anion Gap: 11 mEq/L (ref 3–11)
BILIRUBIN TOTAL: 0.56 mg/dL (ref 0.20–1.20)
BUN: 13.4 mg/dL (ref 7.0–26.0)
CALCIUM: 10.1 mg/dL (ref 8.4–10.4)
CO2: 25 mEq/L (ref 22–29)
Chloride: 100 mEq/L (ref 98–109)
Creatinine: 1.4 mg/dL — ABNORMAL HIGH (ref 0.7–1.3)
EGFR: 59 mL/min/{1.73_m2} — AB (ref >90)
GLUCOSE: 247 mg/dL — AB (ref 70–140)
POTASSIUM: 4.5 meq/L (ref 3.5–5.1)
Sodium: 136 mEq/L (ref 136–145)
TOTAL PROTEIN: 8.6 g/dL — AB (ref 6.4–8.3)

## 2015-02-12 LAB — LACTATE DEHYDROGENASE: LDH: 162 U/L (ref 125–245)

## 2015-02-12 NOTE — Telephone Encounter (Signed)
Gave patient avs report and appointments for February and March.  °

## 2015-02-12 NOTE — Progress Notes (Addendum)
[place] [phone]   [phone]  OFFICE PROGRESS NOTE  Pcp Not In System No address on file  DIAGNOSIS: Recurrent Hodgkin's lymphoma initially diagnosed as Stage III classical Hodgkin's lymphoma, mixed cellularity subtype diagnosed in December 2014.  PRIOR THERAPY:  1) Status post 6 cycles of systemic chemotherapy with ABVD complicated with pulmonary fibrosis, last dose was given 07/14/2013. 2) Salvage chemotherapy with ICE (ifosfamide/mesna, carboplatin and etoposide) first dose was given 03/06/2014. Status post 4 cycles. Last cycle was given in May 2016. 3) chemotherapy embolization for stem cell transplant with etoposide completed on 06/23/2014. 4) autologous peripheral blood stem cell transplant on 07/18/2014. 5) CAR-T cell infusion on 08/04/2014 on a clinical trial at [facility]. 6) the patient has evidence for disease progression on the recent CT scan of the chest on 01/31/2015 which showed evidence for new mediastinal lymphadenopathy and follow-up PET scan on general 25th 2017 showed hypermetabolic activity of the new lymphadenopathy questionable for disease recurrence.  CURRENT THERAPY: Systemic chemotherapy with bendamustine 90 MG/M2 on days 1 and 2 and Brentuximab 1.8 mg/kg on day 1 every 3 weeks in preparation for allogenic stem cell transplant. First cycle on 02/21/2015  INTERVAL HISTORY: [name] [name] 50 y.o. male returns to the clinic today for follow-up visit.  The patient is feeling fine today was no specific complaints except for some dental pain and he needs dental evaluation. Since his last chemotherapy at [facility] in May 2016 the patient underwent several procedures including autologous peripheral blood stem cell transplant in addition to CAR-T-cell infusion. He was found recently on CT scan of the chest as well as a PET scan to have evidence for disease recurrence in the mediastinal lymph nodes. He was  considered for allogeneic stem cell transplant and currently on a search for a donor. Dr. Evette Doffing from the stem cell transplant team at [facility] who recommended for the patient treatment with bendamustine and Rituxan for few cycles until you donor becomes available. The patient preferred to receive his treatment close to home and he came today for reevaluation and consideration of starting this regimen. He denied having any significant weight loss or night sweats. The patient denied having any chest pain, shortness of breath, cough or hemoptysis. He has no nausea or vomiting.   MEDICAL HISTORY: Past Medical History  Diagnosis Date  . Hypertension   . Diabetes mellitus without complication (HCC)     Pre-diabetes  . Depression   . Headache(784.0)   . Thyroid disease     Hx: of  . Cancer ([place]) 2015    cervical lymphadenopathy  . Hodgkin lymphoma ([place]) 2015  . Hepatic artery aneurysm ([place]) 2015    ALLERGIES:  has No Known Allergies.  MEDICATIONS:  Current Outpatient Prescriptions  Medication Sig Dispense Refill  . Azelastine-Fluticasone 137-50 MCG/ACT SUSP Place 1 spray into the nose 2 (two) times daily.    . Cholecalciferol (D 1000) 1000 units capsule Take 1,000 mg by mouth daily.    . eszopiclone (LUNESTA) 2 MG TABS tablet Take 2 mg by mouth at bedtime as needed for sleep. Take immediately before bedtime    . fenofibrate (TRICOR) 145 MG tablet Take 1 tablet (145 mg total) by mouth daily. 30 tablet 6  . levothyroxine (SYNTHROID, LEVOTHROID) 200 MCG tablet Take 200 mcg by mouth daily before breakfast.    . lovastatin (ALTOPREV) 40 MG 24 hr tablet Take 40 mg by mouth at bedtime.    Marland Kitchen  metFORMIN (GLUCOPHAGE) 500 MG tablet Take 500 mg by mouth 2 (two) times daily.     Marland Kitchen OVER THE COUNTER MEDICATION Take 2.5 mLs by mouth daily. *Isotonix Antioxidant Powder*  Mixes with water    . sertraline (ZOLOFT) 100 MG tablet Take 100 mg by mouth daily with breakfast.     . valACYclovir (VALTREX)  500 MG tablet Take 500 mg by mouth daily.     Marland Kitchen oxyCODONE (OXY IR/ROXICODONE) 5 MG immediate release tablet Take 1 tablet (5 mg total) by mouth every 6 (six) hours as needed for severe pain. (Patient not taking: Reported on 12/26/2014) 60 tablet 0   No current facility-administered medications for this visit.    SURGICAL HISTORY:  Past Surgical History  Procedure Laterality Date  . Tonsillectomy    . Mass excision Left 12/31/2012    Procedure: CERVICAL LYMPH NODE BIOPSY;  Surgeon: [name], MD;  Location: [place];  Service: General;  Laterality: Left;    REVIEW OF SYSTEMS:  Constitutional: negative Eyes: negative Ears, nose, mouth, throat, and face: positive for Dental pain Respiratory: negative Cardiovascular: negative Gastrointestinal: negative Genitourinary:negative Integument/breast: negative Hematologic/lymphatic: negative Musculoskeletal:negative Neurological: negative Behavioral/Psych: negative Endocrine: negative Allergic/Immunologic: negative   PHYSICAL EXAMINATION: General appearance: alert, cooperative and no distress Head: Normocephalic, without obvious abnormality, atraumatic Neck: no JVD, supple, symmetrical, trachea midline, thyroid not enlarged, symmetric, no tenderness/mass/nodules and Palpable small left lower cervical lymph node, firm to palpation Lymph nodes: Palpable small left lower cervical lymph node, firm to palpation Resp: clear to auscultation bilaterally Back: symmetric, no curvature. ROM normal. No CVA tenderness. Cardio: regular rate and rhythm, S1, S2 normal, no murmur, click, rub or gallop GI: soft, non-tender; bowel sounds normal; no masses,  no organomegaly Extremities: extremities normal, atraumatic, no cyanosis or edema Neurologic: Alert and oriented X 3, normal strength and tone. Normal symmetric reflexes. Normal coordination and gait  ECOG PERFORMANCE STATUS: 0 - Asymptomatic  Blood pressure 129/96, pulse 110, temperature 98.4 F  (36.9 C), temperature source Oral, resp. rate 18, height 5\' 9"  (1.753 m), weight 217 lb 14.4 oz (98.839 kg), SpO2 98 %.  LABORATORY DATA: Lab Results  Component Value Date   WBC 6.8 02/12/2015   HGB 13.8 02/12/2015   HCT 40.6 02/12/2015   MCV 92.6 02/12/2015   PLT 267 02/12/2015      Chemistry      Component Value Date/Time   NA 133* 05/15/2014 1043   NA 136 05/12/2014 1131   K 4.5 05/15/2014 1043   K 4.5 05/12/2014 1131   CL 97* 05/15/2014 1043   CO2 26 05/15/2014 1043   CO2 22 05/12/2014 1131   BUN 21* 05/15/2014 1043   BUN 9.7 05/12/2014 1131   CREATININE 1.34* 05/15/2014 1043   CREATININE 1.0 05/12/2014 1131      Component Value Date/Time   CALCIUM 9.5 05/15/2014 1043   CALCIUM 9.3 05/12/2014 1131   ALKPHOS 87 05/15/2014 1043   ALKPHOS 120 05/12/2014 1131   AST 35 05/15/2014 1043   AST 41* 05/12/2014 1131   ALT 45 05/15/2014 1043   ALT 59* 05/12/2014 1131   BILITOT 0.8 05/15/2014 1043   BILITOT 0.46 05/12/2014 1131       RADIOGRAPHIC STUDIES: No results found. ASSESSMENT AND PLAN: This is a very pleasant 50 years old white male with history of stage III Hodgkin's lymphoma status post 6 cycles of systemic chemotherapy with ABVD with almost complete response. He is currently undergoing salvage chemotherapy with ICE status post 4  cycles and tolerated the first cycle fairly well. This was followed by peripheral blood stem cell transplant as well as CAR T-cell infusion. Unfortunately recent imaging studies showed evidence for disease progression and the patient is considered for allogeneic stem cell transplant and currently on a search for a donor. It was recommended by the stem cell transplant team to start the patient on treatment with bendamustine and Brentuximab for few cycles until a donor becomes available. I discussed this treatment option with the patient today. I recommended for him treatment with bendamustine 90 MG/M2 on days 1 and 2 in addition to  Brentuximab 1.8 mg/kg on days 1 every 3 weeks. I discussed with the patient adverse effect of this treatment including but not limited to alopecia, myelosuppression, nausea and vomiting, peripheral neuropathy, liver or renal dysfunction. He is expected to start the first dose of this treatment on 02/21/2015. I will order acute hepatitis panel before starting the first dose of Rituxan. He would come back for follow-up visit in one month's for reevaluation before starting cycle #2. For the dental pain, I referred the patient to Dr. Enrique Sack for dental evaluation. He was advised to call immediately if he has any concerning symptoms in the interval. The patient voices understanding of current disease status and treatment options and is in agreement with the current care plan.  All questions were answered. The patient knows to call the clinic with any problems, questions or concerns. We can certainly see the patient much sooner if necessary.  Disclaimer: This note was dictated with voice recognition software. Similar sounding words can inadvertently be transcribed and may not be corrected upon review.

## 2015-02-19 ENCOUNTER — Telehealth: Payer: Self-pay | Admitting: Medical Oncology

## 2015-02-19 NOTE — Telephone Encounter (Signed)
I left a  message at [facility] to please call me back with protocol information for brentuximab and bendamustine as referenced by Dr Evette Doffing. Need to know frequency of cycle . I  called Lattie Haw in pharmacy and she is pulling up information too and will give to [place].

## 2015-02-19 NOTE — Telephone Encounter (Signed)
I called [name] [name] back [phone] ext [phone] and left message that pt will get Brentuximmab adn bendamustine q 3 weeks . I will refax it again at [phone]. Mohamed given protocol reference

## 2015-02-20 ENCOUNTER — Encounter: Payer: Self-pay | Admitting: Pharmacist

## 2015-02-20 ENCOUNTER — Telehealth: Payer: Self-pay | Admitting: *Deleted

## 2015-02-20 NOTE — Telephone Encounter (Signed)
Juliann Pulse from [facility] called with authorization # for pt'sTreatment to start 2/8 Authorization # [id number]

## 2015-02-20 NOTE — Telephone Encounter (Signed)
Attempt to fax Protocol to [place] at [facility]. Unable to send fax. Reached out to [place], left message we are refaxing again today. Request Juliann Pulse at [facility] call if no fax is received. Pt will have chemo 02/21/15

## 2015-02-21 ENCOUNTER — Other Ambulatory Visit (HOSPITAL_BASED_OUTPATIENT_CLINIC_OR_DEPARTMENT_OTHER): Payer: Commercial Managed Care - PPO

## 2015-02-21 ENCOUNTER — Ambulatory Visit (HOSPITAL_BASED_OUTPATIENT_CLINIC_OR_DEPARTMENT_OTHER): Payer: Commercial Managed Care - PPO

## 2015-02-21 VITALS — BP 127/86 | HR 92 | Temp 98.1°F | Resp 18

## 2015-02-21 DIAGNOSIS — Z5111 Encounter for antineoplastic chemotherapy: Secondary | ICD-10-CM | POA: Diagnosis not present

## 2015-02-21 DIAGNOSIS — Z5112 Encounter for antineoplastic immunotherapy: Secondary | ICD-10-CM

## 2015-02-21 DIAGNOSIS — C8122 Mixed cellularity classical Hodgkin lymphoma, intrathoracic lymph nodes: Secondary | ICD-10-CM

## 2015-02-21 DIAGNOSIS — C8123 Mixed cellularity classical Hodgkin lymphoma, intra-abdominal lymph nodes: Secondary | ICD-10-CM

## 2015-02-21 DIAGNOSIS — K0889 Other specified disorders of teeth and supporting structures: Secondary | ICD-10-CM

## 2015-02-21 LAB — CBC WITH DIFFERENTIAL/PLATELET
BASO%: 1 % (ref 0.0–2.0)
Basophils Absolute: 0.1 10*3/uL (ref 0.0–0.1)
EOS ABS: 0.2 10*3/uL (ref 0.0–0.5)
EOS%: 4.2 % (ref 0.0–7.0)
HCT: 39.8 % (ref 38.4–49.9)
HEMOGLOBIN: 13.8 g/dL (ref 13.0–17.1)
LYMPH#: 1.4 10*3/uL (ref 0.9–3.3)
LYMPH%: 27.3 % (ref 14.0–49.0)
MCH: 32.1 pg (ref 27.2–33.4)
MCHC: 34.7 g/dL (ref 32.0–36.0)
MCV: 92.6 fL (ref 79.3–98.0)
MONO#: 0.6 10*3/uL (ref 0.1–0.9)
MONO%: 11.5 % (ref 0.0–14.0)
NEUT%: 56 % (ref 39.0–75.0)
NEUTROS ABS: 2.9 10*3/uL (ref 1.5–6.5)
PLATELETS: 222 10*3/uL (ref 140–400)
RBC: 4.3 10*6/uL (ref 4.20–5.82)
RDW: 13.6 % (ref 11.0–14.6)
WBC: 5.2 10*3/uL (ref 4.0–10.3)

## 2015-02-21 LAB — COMPREHENSIVE METABOLIC PANEL
ALBUMIN: 4 g/dL (ref 3.5–5.0)
ALK PHOS: 82 U/L (ref 40–150)
ALT: 49 U/L (ref 0–55)
ANION GAP: 11 meq/L (ref 3–11)
AST: 40 U/L — ABNORMAL HIGH (ref 5–34)
BILIRUBIN TOTAL: 0.47 mg/dL (ref 0.20–1.20)
BUN: 18.1 mg/dL (ref 7.0–26.0)
CO2: 22 meq/L (ref 22–29)
CREATININE: 1.3 mg/dL (ref 0.7–1.3)
Calcium: 9.6 mg/dL (ref 8.4–10.4)
Chloride: 104 mEq/L (ref 98–109)
EGFR: 65 mL/min/{1.73_m2} — ABNORMAL LOW (ref >90)
GLUCOSE: 209 mg/dL — AB (ref 70–140)
Potassium: 4.3 mEq/L (ref 3.5–5.1)
Sodium: 137 mEq/L (ref 136–145)
TOTAL PROTEIN: 8.4 g/dL — AB (ref 6.4–8.3)

## 2015-02-21 MED ORDER — PALONOSETRON HCL INJECTION 0.25 MG/5ML
INTRAVENOUS | Status: AC
  Filled 2015-02-21: qty 5

## 2015-02-21 MED ORDER — DIPHENHYDRAMINE HCL 50 MG/ML IJ SOLN
INTRAMUSCULAR | Status: AC
  Filled 2015-02-21: qty 1

## 2015-02-21 MED ORDER — SODIUM CHLORIDE 0.9 % IV SOLN
90.0000 mg/m2 | Freq: Once | INTRAVENOUS | Status: AC
  Administered 2015-02-21: 200 mg via INTRAVENOUS
  Filled 2015-02-21: qty 8

## 2015-02-21 MED ORDER — ONDANSETRON HCL 8 MG PO TABS: ORAL_TABLET | ORAL | Status: DC

## 2015-02-21 MED ORDER — PROCHLORPERAZINE MALEATE 10 MG PO TABS: 10.0000 mg | ORAL_TABLET | Freq: Four times a day (QID) | ORAL | Status: DC | PRN

## 2015-02-21 MED ORDER — ACETAMINOPHEN 325 MG PO TABS
650.0000 mg | ORAL_TABLET | Freq: Once | ORAL | Status: AC
  Administered 2015-02-21: 650 mg via ORAL

## 2015-02-21 MED ORDER — SODIUM CHLORIDE 0.9 % IV SOLN
1.8000 mg/kg | Freq: Once | INTRAVENOUS | Status: AC
  Administered 2015-02-21: 180 mg via INTRAVENOUS
  Filled 2015-02-21: qty 36

## 2015-02-21 MED ORDER — ACETAMINOPHEN 325 MG PO TABS
ORAL_TABLET | ORAL | Status: AC
  Filled 2015-02-21: qty 2

## 2015-02-21 MED ORDER — PALONOSETRON HCL INJECTION 0.25 MG/5ML
0.2500 mg | Freq: Once | INTRAVENOUS | Status: AC
  Administered 2015-02-21: 0.25 mg via INTRAVENOUS

## 2015-02-21 MED ORDER — DEXAMETHASONE SODIUM PHOSPHATE 100 MG/10ML IJ SOLN
10.0000 mg | Freq: Once | INTRAMUSCULAR | Status: AC
  Administered 2015-02-21: 10 mg via INTRAVENOUS
  Filled 2015-02-21: qty 1

## 2015-02-21 MED ORDER — DIPHENHYDRAMINE HCL 50 MG/ML IJ SOLN
50.0000 mg | Freq: Once | INTRAMUSCULAR | Status: AC
  Administered 2015-02-21: 50 mg via INTRAVENOUS

## 2015-02-21 MED ORDER — SODIUM CHLORIDE 0.9 % IV SOLN
Freq: Once | INTRAVENOUS | Status: AC
  Administered 2015-02-21: 09:00:00 via INTRAVENOUS

## 2015-02-21 NOTE — Patient Instructions (Signed)
[place] Discharge Instructions for Patients Receiving Chemotherapy  Today you received the following chemotherapy agents; Brentuximab and Bendamustine.    To help prevent nausea and vomiting after your treatment, we encourage you to take your nausea medication as directed.    If you develop nausea and vomiting that is not controlled by your nausea medication, call the clinic.   BELOW ARE SYMPTOMS THAT SHOULD BE REPORTED IMMEDIATELY:  *FEVER GREATER THAN 100.5 F  *CHILLS WITH OR WITHOUT FEVER  NAUSEA AND VOMITING THAT IS NOT CONTROLLED WITH YOUR NAUSEA MEDICATION  *UNUSUAL SHORTNESS OF BREATH  *UNUSUAL BRUISING OR BLEEDING  TENDERNESS IN MOUTH AND THROAT WITH OR WITHOUT PRESENCE OF ULCERS  *URINARY PROBLEMS  *BOWEL PROBLEMS  UNUSUAL RASH Items with * indicate a potential emergency and should be followed up as soon as possible.  Feel free to call the clinic you have any questions or concerns. The clinic phone number is [phone].  Please show the [place] at check-in to the Emergency Department and triage nurse.

## 2015-02-22 ENCOUNTER — Ambulatory Visit (HOSPITAL_BASED_OUTPATIENT_CLINIC_OR_DEPARTMENT_OTHER): Payer: Commercial Managed Care - PPO

## 2015-02-22 VITALS — BP 120/76 | HR 86 | Temp 97.9°F | Resp 18

## 2015-02-22 DIAGNOSIS — C8122 Mixed cellularity classical Hodgkin lymphoma, intrathoracic lymph nodes: Secondary | ICD-10-CM

## 2015-02-22 DIAGNOSIS — Z5111 Encounter for antineoplastic chemotherapy: Secondary | ICD-10-CM

## 2015-02-22 DIAGNOSIS — C8123 Mixed cellularity classical Hodgkin lymphoma, intra-abdominal lymph nodes: Secondary | ICD-10-CM

## 2015-02-22 LAB — HEPATITIS PANEL, ACUTE
HEP A IGM: NEGATIVE
HEP B S AG: NEGATIVE
Hep B Core Ab, IgM: NEGATIVE
Hep C Virus Ab: <0.1 s/co ratio (ref 0.0–0.9)

## 2015-02-22 MED ORDER — SODIUM CHLORIDE 0.9 % IV SOLN
INTRAVENOUS | Status: DC
  Administered 2015-02-22: 08:00:00 via INTRAVENOUS

## 2015-02-22 MED ORDER — DEXAMETHASONE SODIUM PHOSPHATE 100 MG/10ML IJ SOLN
10.0000 mg | Freq: Once | INTRAMUSCULAR | Status: AC
  Administered 2015-02-22: 10 mg via INTRAVENOUS
  Filled 2015-02-22: qty 1

## 2015-02-22 MED ORDER — SODIUM CHLORIDE 0.9 % IV SOLN
90.0000 mg/m2 | Freq: Once | INTRAVENOUS | Status: AC
  Administered 2015-02-22: 200 mg via INTRAVENOUS
  Filled 2015-02-22: qty 8

## 2015-02-22 NOTE — Patient Instructions (Signed)
[facility] Discharge Instructions for Patients Receiving Chemotherapy  Today you received the following chemotherapy agents Bendamustine  To help prevent nausea and vomiting after your treatment, we encourage you to take your nausea medication as directed   If you develop nausea and vomiting that is not controlled by your nausea medication, call the clinic.   BELOW ARE SYMPTOMS THAT SHOULD BE REPORTED IMMEDIATELY:  *FEVER GREATER THAN 100.5 F  *CHILLS WITH OR WITHOUT FEVER  NAUSEA AND VOMITING THAT IS NOT CONTROLLED WITH YOUR NAUSEA MEDICATION  *UNUSUAL SHORTNESS OF BREATH  *UNUSUAL BRUISING OR BLEEDING  TENDERNESS IN MOUTH AND THROAT WITH OR WITHOUT PRESENCE OF ULCERS  *URINARY PROBLEMS  *BOWEL PROBLEMS  UNUSUAL RASH Items with * indicate a potential emergency and should be followed up as soon as possible.  Feel free to call the clinic you have any questions or concerns. The clinic phone number is [phone].  Please show the CHEMO ALERT CARD at check-in to the Emergency Department and triage nurse.   

## 2015-02-23 ENCOUNTER — Ambulatory Visit (INDEPENDENT_AMBULATORY_CARE_PROVIDER_SITE_OTHER): Payer: Commercial Managed Care - PPO | Admitting: Cardiology

## 2015-02-23 ENCOUNTER — Encounter: Payer: Self-pay | Admitting: Cardiology

## 2015-02-23 VITALS — BP 146/100 | HR 96 | Ht 69.0 in | Wt 220.0 lb

## 2015-02-23 DIAGNOSIS — E785 Hyperlipidemia, unspecified: Secondary | ICD-10-CM | POA: Diagnosis not present

## 2015-02-23 DIAGNOSIS — I5189 Other ill-defined heart diseases: Secondary | ICD-10-CM

## 2015-02-23 DIAGNOSIS — I519 Heart disease, unspecified: Secondary | ICD-10-CM | POA: Diagnosis not present

## 2015-02-23 DIAGNOSIS — I2510 Atherosclerotic heart disease of native coronary artery without angina pectoris: Secondary | ICD-10-CM | POA: Diagnosis not present

## 2015-02-23 LAB — LIPID PANEL
CHOLESTEROL: 143 mg/dL (ref 125–200)
HDL: 31 mg/dL — ABNORMAL LOW (ref >=40)
LDL Cholesterol: 52 mg/dL (ref <130)
TRIGLYCERIDES: 301 mg/dL — AB (ref <150)
Total CHOL/HDL Ratio: 4.6 Ratio (ref <=5.0)
VLDL: 60 mg/dL — AB (ref <30)

## 2015-02-23 NOTE — Patient Instructions (Signed)
Medication Instructions:  Start aspirin 81mg  daily.  Labwork: Your physician recommends that you have a lipid profile today   Testing/Procedures: None   Follow-Up: Your physician wants you to follow-up in: 1 year with Dr Aundra Dubin. (February 2018).  You will receive a reminder letter in the mail two months in advance. If you don't receive a letter, please call our office to schedule the follow-up appointment.        If you need a refill on your cardiac medications before your next appointment, please call your pharmacy.

## 2015-02-25 DIAGNOSIS — I2510 Atherosclerotic heart disease of native coronary artery without angina pectoris: Secondary | ICD-10-CM | POA: Insufficient documentation

## 2015-02-25 NOTE — Progress Notes (Signed)
Patient ID: [name] [name], male   DOB: 21-Jun-1965, 50 y.o.   MRN: [id number]  50 yo with history of HTN, DM2, hyperlipidemia and Hodgkins disease presents for cardiology evaluation.  He had ABVD chemotherapy.  No fall in EF with doxorubicin.  Course complicated by chemo-related pulmonary fibrosis (resolved).  Recurrent Hodgkins with salvage chemo with ifosfamide, carboplatin, etoposide.  Autologous stem cell transplant in 7/16.  Mediastinal relapse on recent CT, now getting brentuximab and bendamustine, planned for allogeneic stem cell transplant.   Patient reports no exertional chest pain or dyspnea.  BP running high today, not usually elevated.  Last echo in 5/16 at [facility] with EF 55-60%.  CT chest showed coronary atherosclerosis.  Working full time.   Labs (1/15): K 4.2, creatinine 1.2 Labs (2/17): K 4.3, creatinine 1.3  PMH: 1. Hypothyroidism 2. HTN 3. Hyperlipidemia: very high triglycerides.  4. Type II diabetes 5. Depression 6. Hodgkins disease: Chemotherapy with ABVD initially (includes doxarubicin). Complicated by pulmonary fibrosis (resolved).  Recurrent Hodgkins with salvage chemo with ifosfamide, carboplatin, etoposide.  Autologous stem cell transplant in 7/16.  Mediastinal relapse on recent CT, now getting brentuximab and bendamustine, planned for allogeneic stem cell transplant.  7. Echo (1/15): EF 99991111, grade I diastolic dysfunction, no significant valvular abnormalities. Echo (5/16) with EF 55-60%, mild MR, mild AI.  8. 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  Endovascular repair in 1/15.  9. CAD: Atherosclerosis noted on chest CT.   SH: Works in [employer]. Divorced, has girlfriend, quit smoking 5/14.    FH: Father with MI x 3, 1st in 100s-40s; father also had Hodgkins disease,  grandmother with MI, mother AAA  ROS: All systems reviewed and negative except as per HPI.   Current Outpatient Prescriptions  Medication Sig Dispense Refill  . Azelastine-Fluticasone  137-50 MCG/ACT SUSP Place 1 spray into the nose 2 (two) times daily.    . Cholecalciferol (D 1000) 1000 units capsule Take 1,000 mg by mouth daily.    . eszopiclone (LUNESTA) 2 MG TABS tablet Take 2 mg by mouth at bedtime as needed for sleep. Take immediately before bedtime    . fenofibrate (TRICOR) 145 MG tablet Take 1 tablet (145 mg total) by mouth daily. 30 tablet 6  . levothyroxine (SYNTHROID, LEVOTHROID) 200 MCG tablet Take 200 mcg by mouth daily before breakfast.    . lovastatin (ALTOPREV) 40 MG 24 hr tablet Take 40 mg by mouth at bedtime.    . metFORMIN (GLUCOPHAGE) 500 MG tablet Take 500 mg by mouth 2 (two) times daily.     . ondansetron (ZOFRAN) 8 MG tablet Every 12 hours as needed for nausea/vomiting.  Begin on day 3 after chemo. 30 tablet 1  . OVER THE COUNTER MEDICATION Take 2.5 mLs by mouth daily. *Isotonix Antioxidant Powder*  Mixes with water    . oxyCODONE (OXY IR/ROXICODONE) 5 MG immediate release tablet Take 1 tablet (5 mg total) by mouth every 6 (six) hours as needed for severe pain. 60 tablet 0  . prochlorperazine (COMPAZINE) 10 MG tablet Take 1 tablet (10 mg total) by mouth every 6 (six) hours as needed for nausea or vomiting. 30 tablet 1  . sertraline (ZOLOFT) 100 MG tablet Take 100 mg by mouth daily with breakfast.     . valACYclovir (VALTREX) 500 MG tablet Take 500 mg by mouth daily.     Marland Kitchen aspirin EC 81 MG tablet Take 1 tablet (81 mg total) by mouth daily.  No current facility-administered medications for this visit.    BP 146/100 mmHg  Pulse 96  Ht 5\' 9"  (1.753 m)  Wt 220 lb (99.791 kg)  BMI 32.47 kg/m2 General: NAD, overweight Neck: No JVD, no thyromegaly or thyroid nodule.  Lungs: Clear to auscultation bilaterally with normal respiratory effort. CV: Nondisplaced PMI.  Heart regular S1/S2, no S3/S4, no murmur.  No peripheral edema.  No carotid bruit.  Normal pedal pulses.  Abdomen: Soft, nontender, no hepatosplenomegaly, no distention.  Skin: Intact without  lesions or rashes.  Neurologic: Alert and oriented x 3.  Psych: Normal affect. Extremities: No clubbing or cyanosis.  HEENT: Normal.   Assessment/Plan: 1. HTN: BP is high today.  I will have him check BP daily and call in readings in 2 weeks.  He may need treatment. 2. CAD: Patient's father had premature CAD.  He has HTN, DM, and hyperlipidemia.  No ischemic symptoms.  He was noted to have coronary atherosclerosis on CT chest.  - Continue lovastatin and start ASA 81 daily.   3. Hyperlipidemia: On fenofibrate and lovastatin. Will check lipids today.  Would aim for LDL < 70 if possible with coronary atherosclerosis at young age on chest CT.    4. H/o doxorubicin: Echo in 5/16 showed normal EF.   Followup in 1 year.   [name] [name] 02/25/2015 8:41 PM

## 2015-02-28 ENCOUNTER — Other Ambulatory Visit: Payer: Self-pay | Admitting: *Deleted

## 2015-02-28 ENCOUNTER — Other Ambulatory Visit (HOSPITAL_BASED_OUTPATIENT_CLINIC_OR_DEPARTMENT_OTHER): Payer: Commercial Managed Care - PPO

## 2015-02-28 ENCOUNTER — Telehealth: Payer: Self-pay | Admitting: *Deleted

## 2015-02-28 DIAGNOSIS — C8120 Mixed cellularity classical Hodgkin lymphoma, unspecified site: Secondary | ICD-10-CM

## 2015-02-28 DIAGNOSIS — C8122 Mixed cellularity classical Hodgkin lymphoma, intrathoracic lymph nodes: Secondary | ICD-10-CM | POA: Diagnosis not present

## 2015-02-28 DIAGNOSIS — E785 Hyperlipidemia, unspecified: Secondary | ICD-10-CM

## 2015-02-28 LAB — COMPREHENSIVE METABOLIC PANEL
ALK PHOS: 90 U/L (ref 40–150)
ALT: 45 U/L (ref 0–55)
AST: 40 U/L — AB (ref 5–34)
Albumin: 4.1 g/dL (ref 3.5–5.0)
Anion Gap: 12 mEq/L — ABNORMAL HIGH (ref 3–11)
BILIRUBIN TOTAL: 0.7 mg/dL (ref 0.20–1.20)
BUN: 9.4 mg/dL (ref 7.0–26.0)
CO2: 25 mEq/L (ref 22–29)
CREATININE: 1.4 mg/dL — AB (ref 0.7–1.3)
Calcium: 9.9 mg/dL (ref 8.4–10.4)
Chloride: 99 mEq/L (ref 98–109)
EGFR: 59 mL/min/{1.73_m2} — ABNORMAL LOW (ref >90)
Glucose: 293 mg/dl — ABNORMAL HIGH (ref 70–140)
Potassium: 4.1 mEq/L (ref 3.5–5.1)
SODIUM: 136 meq/L (ref 136–145)
TOTAL PROTEIN: 8.3 g/dL (ref 6.4–8.3)

## 2015-02-28 LAB — CBC WITH DIFFERENTIAL/PLATELET
BASO%: 0.9 % (ref 0.0–2.0)
Basophils Absolute: 0 10*3/uL (ref 0.0–0.1)
EOS%: 2.5 % (ref 0.0–7.0)
Eosinophils Absolute: 0.1 10*3/uL (ref 0.0–0.5)
HCT: 39 % (ref 38.4–49.9)
HGB: 13.3 g/dL (ref 13.0–17.1)
LYMPH%: 10.5 % — AB (ref 14.0–49.0)
MCH: 31.9 pg (ref 27.2–33.4)
MCHC: 34 g/dL (ref 32.0–36.0)
MCV: 93.8 fL (ref 79.3–98.0)
MONO#: 0.6 10*3/uL (ref 0.1–0.9)
MONO%: 12.9 % (ref 0.0–14.0)
NEUT%: 73.2 % (ref 39.0–75.0)
NEUTROS ABS: 3.6 10*3/uL (ref 1.5–6.5)
PLATELETS: 257 10*3/uL (ref 140–400)
RBC: 4.16 10*6/uL — AB (ref 4.20–5.82)
RDW: 13.8 % (ref 11.0–14.6)
WBC: 4.9 10*3/uL (ref 4.0–10.3)
lymph#: 0.5 10*3/uL — ABNORMAL LOW (ref 0.9–3.3)

## 2015-02-28 MED ORDER — OMEGA-3-ACID ETHYL ESTERS 1 G PO CAPS: 2.0000 g | ORAL_CAPSULE | Freq: Two times a day (BID) | ORAL | Status: DC

## 2015-02-28 NOTE — Telephone Encounter (Signed)
[name] Mans, MD  Sent: Sun February 25, 2015 8:52 PM   To: Katrine Coho, RN     Message    Forgot to discuss elevated BP with him. Have him check daily and call in numbers in 2 wks.    Spoke with pt about message above from Dr Aundra Dubin. He reports on day of visit with Dr. Aundra Dubin he initially went to wrong office and he thinks that is why blood pressure was elevated.  He will check daily and call us in 2 weeks with the readings.

## 2015-03-07 ENCOUNTER — Encounter: Payer: Self-pay | Admitting: Internal Medicine

## 2015-03-07 ENCOUNTER — Other Ambulatory Visit (HOSPITAL_BASED_OUTPATIENT_CLINIC_OR_DEPARTMENT_OTHER): Payer: Commercial Managed Care - PPO

## 2015-03-07 DIAGNOSIS — C8120 Mixed cellularity classical Hodgkin lymphoma, unspecified site: Secondary | ICD-10-CM | POA: Diagnosis not present

## 2015-03-07 LAB — CBC WITH DIFFERENTIAL/PLATELET
BASO%: 1 % (ref 0.0–2.0)
Basophils Absolute: 0 10*3/uL (ref 0.0–0.1)
EOS ABS: 0.1 10*3/uL (ref 0.0–0.5)
EOS%: 2.8 % (ref 0.0–7.0)
HEMATOCRIT: 37.7 % — AB (ref 38.4–49.9)
HEMOGLOBIN: 13 g/dL (ref 13.0–17.1)
LYMPH%: 16.3 % (ref 14.0–49.0)
MCH: 31.9 pg (ref 27.2–33.4)
MCHC: 34.5 g/dL (ref 32.0–36.0)
MCV: 92.6 fL (ref 79.3–98.0)
MONO#: 0.5 10*3/uL (ref 0.1–0.9)
MONO%: 12 % (ref 0.0–14.0)
NEUT%: 67.9 % (ref 39.0–75.0)
NEUTROS ABS: 2.7 10*3/uL (ref 1.5–6.5)
PLATELETS: 201 10*3/uL (ref 140–400)
RBC: 4.07 10*6/uL — AB (ref 4.20–5.82)
RDW: 13.6 % (ref 11.0–14.6)
WBC: 4 10*3/uL (ref 4.0–10.3)
lymph#: 0.7 10*3/uL — ABNORMAL LOW (ref 0.9–3.3)

## 2015-03-07 LAB — COMPREHENSIVE METABOLIC PANEL
ALT: 70 U/L — AB (ref 0–55)
AST: 50 U/L — AB (ref 5–34)
Albumin: 4.1 g/dL (ref 3.5–5.0)
Alkaline Phosphatase: 85 U/L (ref 40–150)
Anion Gap: 11 mEq/L (ref 3–11)
BUN: 9.7 mg/dL (ref 7.0–26.0)
CHLORIDE: 99 meq/L (ref 98–109)
CO2: 27 meq/L (ref 22–29)
CREATININE: 1.4 mg/dL — AB (ref 0.7–1.3)
Calcium: 9.7 mg/dL (ref 8.4–10.4)
EGFR: 58 mL/min/{1.73_m2} — ABNORMAL LOW (ref >90)
GLUCOSE: 228 mg/dL — AB (ref 70–140)
POTASSIUM: 4.3 meq/L (ref 3.5–5.1)
SODIUM: 137 meq/L (ref 136–145)
Total Bilirubin: 0.51 mg/dL (ref 0.20–1.20)
Total Protein: 8.1 g/dL (ref 6.4–8.3)

## 2015-03-14 ENCOUNTER — Other Ambulatory Visit (HOSPITAL_BASED_OUTPATIENT_CLINIC_OR_DEPARTMENT_OTHER): Payer: Commercial Managed Care - PPO

## 2015-03-14 DIAGNOSIS — C8122 Mixed cellularity classical Hodgkin lymphoma, intrathoracic lymph nodes: Secondary | ICD-10-CM

## 2015-03-14 DIAGNOSIS — C8120 Mixed cellularity classical Hodgkin lymphoma, unspecified site: Secondary | ICD-10-CM

## 2015-03-14 LAB — COMPREHENSIVE METABOLIC PANEL
ALT: 63 U/L — AB (ref 0–55)
ANION GAP: 9 meq/L (ref 3–11)
AST: 59 U/L — ABNORMAL HIGH (ref 5–34)
Albumin: 4.3 g/dL (ref 3.5–5.0)
Alkaline Phosphatase: 92 U/L (ref 40–150)
BUN: 9.7 mg/dL (ref 7.0–26.0)
CHLORIDE: 100 meq/L (ref 98–109)
CO2: 27 meq/L (ref 22–29)
CREATININE: 1.4 mg/dL — AB (ref 0.7–1.3)
Calcium: 9.9 mg/dL (ref 8.4–10.4)
EGFR: 58 mL/min/{1.73_m2} — AB (ref >90)
Glucose: 210 mg/dl — ABNORMAL HIGH (ref 70–140)
POTASSIUM: 4.7 meq/L (ref 3.5–5.1)
Sodium: 136 mEq/L (ref 136–145)
Total Bilirubin: 0.53 mg/dL (ref 0.20–1.20)
Total Protein: 8.3 g/dL (ref 6.4–8.3)

## 2015-03-14 LAB — CBC WITH DIFFERENTIAL/PLATELET
BASO%: 1.4 % (ref 0.0–2.0)
Basophils Absolute: 0.1 10*3/uL (ref 0.0–0.1)
EOS%: 2.2 % (ref 0.0–7.0)
Eosinophils Absolute: 0.1 10*3/uL (ref 0.0–0.5)
HCT: 40 % (ref 38.4–49.9)
HEMOGLOBIN: 13.9 g/dL (ref 13.0–17.1)
LYMPH%: 20.5 % (ref 14.0–49.0)
MCH: 32.1 pg (ref 27.2–33.4)
MCHC: 34.8 g/dL (ref 32.0–36.0)
MCV: 92.4 fL (ref 79.3–98.0)
MONO#: 0.7 10*3/uL (ref 0.1–0.9)
MONO%: 18 % — AB (ref 0.0–14.0)
NEUT%: 57.9 % (ref 39.0–75.0)
NEUTROS ABS: 2.1 10*3/uL (ref 1.5–6.5)
Platelets: 202 10*3/uL (ref 140–400)
RBC: 4.33 10*6/uL (ref 4.20–5.82)
RDW: 13.8 % (ref 11.0–14.6)
WBC: 3.6 10*3/uL — AB (ref 4.0–10.3)
lymph#: 0.7 10*3/uL — ABNORMAL LOW (ref 0.9–3.3)

## 2015-03-15 ENCOUNTER — Telehealth: Payer: Self-pay | Admitting: *Deleted

## 2015-03-15 NOTE — Telephone Encounter (Signed)
"  This is Oley Balm with [facility].  When is his next appointment and could we get a recent office note for this patient."  Denies having January 30, 20107.  This note routed to fax [phone].  Will call for next note.  Advised to ask for H.I.M.

## 2015-03-20 ENCOUNTER — Encounter: Payer: Self-pay | Admitting: Internal Medicine

## 2015-03-20 ENCOUNTER — Ambulatory Visit (HOSPITAL_BASED_OUTPATIENT_CLINIC_OR_DEPARTMENT_OTHER): Payer: Commercial Managed Care - PPO | Admitting: Internal Medicine

## 2015-03-20 ENCOUNTER — Other Ambulatory Visit (HOSPITAL_BASED_OUTPATIENT_CLINIC_OR_DEPARTMENT_OTHER): Payer: Commercial Managed Care - PPO

## 2015-03-20 ENCOUNTER — Telehealth: Payer: Self-pay | Admitting: Internal Medicine

## 2015-03-20 ENCOUNTER — Telehealth: Payer: Self-pay | Admitting: *Deleted

## 2015-03-20 VITALS — BP 123/86 | HR 95 | Temp 98.2°F | Resp 18 | Ht 69.0 in | Wt 216.4 lb

## 2015-03-20 DIAGNOSIS — C8123 Mixed cellularity classical Hodgkin lymphoma, intra-abdominal lymph nodes: Secondary | ICD-10-CM

## 2015-03-20 DIAGNOSIS — Z5111 Encounter for antineoplastic chemotherapy: Secondary | ICD-10-CM

## 2015-03-20 DIAGNOSIS — C8120 Mixed cellularity classical Hodgkin lymphoma, unspecified site: Secondary | ICD-10-CM

## 2015-03-20 HISTORY — DX: Encounter for antineoplastic chemotherapy: Z51.11

## 2015-03-20 LAB — CBC WITH DIFFERENTIAL/PLATELET
BASO%: 1.4 % (ref 0.0–2.0)
BASOS ABS: 0.1 10*3/uL (ref 0.0–0.1)
EOS ABS: 0.1 10*3/uL (ref 0.0–0.5)
EOS%: 1.2 % (ref 0.0–7.0)
HEMATOCRIT: 41.7 % (ref 38.4–49.9)
HEMOGLOBIN: 14.2 g/dL (ref 13.0–17.1)
LYMPH#: 0.8 10*3/uL — AB (ref 0.9–3.3)
LYMPH%: 12 % — ABNORMAL LOW (ref 14.0–49.0)
MCH: 31.9 pg (ref 27.2–33.4)
MCHC: 34.1 g/dL (ref 32.0–36.0)
MCV: 93.3 fL (ref 79.3–98.0)
MONO#: 0.7 10*3/uL (ref 0.1–0.9)
MONO%: 10.9 % (ref 0.0–14.0)
NEUT%: 74.5 % (ref 39.0–75.0)
NEUTROS ABS: 4.8 10*3/uL (ref 1.5–6.5)
Platelets: 226 10*3/uL (ref 140–400)
RBC: 4.47 10*6/uL (ref 4.20–5.82)
RDW: 14.5 % (ref 11.0–14.6)
WBC: 6.5 10*3/uL (ref 4.0–10.3)

## 2015-03-20 LAB — COMPREHENSIVE METABOLIC PANEL
ALBUMIN: 4.3 g/dL (ref 3.5–5.0)
ALK PHOS: 91 U/L (ref 40–150)
ALT: 61 U/L — ABNORMAL HIGH (ref 0–55)
AST: 49 U/L — AB (ref 5–34)
Anion Gap: 11 mEq/L (ref 3–11)
BUN: 12.4 mg/dL (ref 7.0–26.0)
CALCIUM: 10 mg/dL (ref 8.4–10.4)
CO2: 25 mEq/L (ref 22–29)
Chloride: 99 mEq/L (ref 98–109)
Creatinine: 1.4 mg/dL — ABNORMAL HIGH (ref 0.7–1.3)
EGFR: 59 mL/min/{1.73_m2} — AB (ref >90)
GLUCOSE: 182 mg/dL — AB (ref 70–140)
POTASSIUM: 4.2 meq/L (ref 3.5–5.1)
Sodium: 136 mEq/L (ref 136–145)
TOTAL PROTEIN: 8.3 g/dL (ref 6.4–8.3)
Total Bilirubin: 0.6 mg/dL (ref 0.20–1.20)

## 2015-03-20 MED ORDER — LIDOCAINE-PRILOCAINE 2.5-2.5 % EX CREA: 1.0000 "application " | TOPICAL_CREAM | CUTANEOUS | Status: DC | PRN

## 2015-03-20 NOTE — Telephone Encounter (Signed)
Per staff message and POF I have scheduled appts. Advised scheduler of appts. JMW  

## 2015-03-20 NOTE — Progress Notes (Signed)
[facility] [phone]   [phone]  OFFICE PROGRESS NOTE  Pcp Not In System No address on file  DIAGNOSIS: Recurrent Hodgkin's lymphoma initially diagnosed as Stage III classical Hodgkin's lymphoma, mixed cellularity subtype diagnosed in December 2014.  PRIOR THERAPY:  1) Status post 6 cycles of systemic chemotherapy with ABVD complicated with pulmonary fibrosis, last dose was given 07/14/2013. 2) Salvage chemotherapy with ICE (ifosfamide/mesna, carboplatin and etoposide) first dose was given 03/06/2014. Status post 4 cycles. Last cycle was given in May 2016. 3) chemotherapy embolization for stem cell transplant with etoposide completed on 06/23/2014. 4) autologous peripheral blood stem cell transplant on 07/18/2014. 5) CAR-T cell infusion on 08/04/2014 on a clinical trial at [facility]. 6) the patient has evidence for disease progression on the recent CT scan of the chest on 01/31/2015 which showed evidence for new mediastinal lymphadenopathy and follow-up PET scan on general 25th 2017 showed hypermetabolic activity of the new lymphadenopathy questionable for disease recurrence.  CURRENT THERAPY: Systemic chemotherapy with bendamustine 90 MG/M2 on days 1 and 2 and Brentuximab 1.8 mg/kg on day 1 every 3 weeks in preparation for allogenic stem cell transplant. First cycle on 02/21/2015  INTERVAL HISTORY: [name] [name] 50 y.o. male returns to the clinic today for follow-up visit. The patient is feeling fine today with no specific complaints. He tolerated the first cycle of his treatment with bendamustine and Brentuximab fairly well with no significant adverse effects. He denied having any significant nausea or vomiting. He has no fever or chills. He denied having any significant weight loss or night sweats. The patient has no significant chest pain, shortness of breath, cough or hemoptysis. He is interested in having a Port-A-Cath placed for IV  infusion. He is here today to start cycle #2 of his treatment.   MEDICAL HISTORY: Past Medical History  Diagnosis Date  . Hypertension   . Diabetes mellitus without complication (HCC)     Pre-diabetes  . Depression   . Headache(784.0)   . Thyroid disease     Hx: of  . Cancer ([place]) 2015    cervical lymphadenopathy  . Hodgkin lymphoma ([place]) 2015  . Hepatic artery aneurysm ([place]) 2015  . Pain, dental 02/12/2015    ALLERGIES:  has No Known Allergies.  MEDICATIONS:  Current Outpatient Prescriptions  Medication Sig Dispense Refill  . aspirin EC 81 MG tablet Take 1 tablet (81 mg total) by mouth daily.    . Azelastine-Fluticasone 137-50 MCG/ACT SUSP Place 1 spray into the nose 2 (two) times daily.    . Cholecalciferol (D 1000) 1000 units capsule Take 1,000 mg by mouth daily.    . eszopiclone (LUNESTA) 2 MG TABS tablet Take 2 mg by mouth at bedtime as needed for sleep. Take immediately before bedtime    . fenofibrate (TRICOR) 145 MG tablet Take 1 tablet (145 mg total) by mouth daily. 30 tablet 6  . levothyroxine (SYNTHROID, LEVOTHROID) 200 MCG tablet Take 200 mcg by mouth daily before breakfast.    . lovastatin (ALTOPREV) 40 MG 24 hr tablet Take 40 mg by mouth at bedtime.    . metFORMIN (GLUCOPHAGE) 500 MG tablet Take 500 mg by mouth 2 (two) times daily.     Marland Kitchen omega-3 acid ethyl esters (LOVAZA) 1 g capsule Take 2 capsules (2 g total) by mouth 2 (two) times daily. 120 capsule 11  . ondansetron (ZOFRAN) 8 MG tablet Every 12 hours as needed for nausea/vomiting.  Begin on  day 3 after chemo. 30 tablet 1  . OVER THE COUNTER MEDICATION Take 2.5 mLs by mouth daily. *Isotonix Antioxidant Powder*  Mixes with water    . oxyCODONE (OXY IR/ROXICODONE) 5 MG immediate release tablet Take 1 tablet (5 mg total) by mouth every 6 (six) hours as needed for severe pain. 60 tablet 0  . prochlorperazine (COMPAZINE) 10 MG tablet Take 1 tablet (10 mg total) by mouth every 6 (six) hours as needed for nausea or  vomiting. 30 tablet 1  . sertraline (ZOLOFT) 100 MG tablet Take 100 mg by mouth daily with breakfast.     . valACYclovir (VALTREX) 500 MG tablet Take 500 mg by mouth daily.      No current facility-administered medications for this visit.    SURGICAL HISTORY:  Past Surgical History  Procedure Laterality Date  . Tonsillectomy    . Mass excision Left 12/31/2012    Procedure: CERVICAL LYMPH NODE BIOPSY;  Surgeon: [name], MD;  Location: [facility];  Service: General;  Laterality: Left;    REVIEW OF SYSTEMS:  A comprehensive review of systems was negative.   PHYSICAL EXAMINATION: General appearance: alert, cooperative and no distress Head: Normocephalic, without obvious abnormality, atraumatic Neck: no JVD, supple, symmetrical, trachea midline, thyroid not enlarged, symmetric, no tenderness/mass/nodules and Palpable small left lower cervical lymph node, firm to palpation Lymph nodes: Palpable small left lower cervical lymph node, firm to palpation Resp: clear to auscultation bilaterally Back: symmetric, no curvature. ROM normal. No CVA tenderness. Cardio: regular rate and rhythm, S1, S2 normal, no murmur, click, rub or gallop GI: soft, non-tender; bowel sounds normal; no masses,  no organomegaly Extremities: extremities normal, atraumatic, no cyanosis or edema Neurologic: Alert and oriented X 3, normal strength and tone. Normal symmetric reflexes. Normal coordination and gait  ECOG PERFORMANCE STATUS: 0 - Asymptomatic  Blood pressure 123/86, pulse 95, temperature 98.2 F (36.8 C), temperature source Oral, resp. rate 18, height 5\' 9"  (1.753 m), weight 216 lb 6.4 oz (98.158 kg), SpO2 97 %.  LABORATORY DATA: Lab Results  Component Value Date   WBC 6.5 03/20/2015   HGB 14.2 03/20/2015   HCT 41.7 03/20/2015   MCV 93.3 03/20/2015   PLT 226 03/20/2015      Chemistry      Component Value Date/Time   NA 136 03/14/2015 1106   NA 133* 05/15/2014 1043   K 4.7 03/14/2015 1106   K  4.5 05/15/2014 1043   CL 97* 05/15/2014 1043   CO2 27 03/14/2015 1106   CO2 26 05/15/2014 1043   BUN 9.7 03/14/2015 1106   BUN 21* 05/15/2014 1043   CREATININE 1.4* 03/14/2015 1106   CREATININE 1.34* 05/15/2014 1043      Component Value Date/Time   CALCIUM 9.9 03/14/2015 1106   CALCIUM 9.5 05/15/2014 1043   ALKPHOS 92 03/14/2015 1106   ALKPHOS 87 05/15/2014 1043   AST 59* 03/14/2015 1106   AST 35 05/15/2014 1043   ALT 63* 03/14/2015 1106   ALT 45 05/15/2014 1043   BILITOT 0.53 03/14/2015 1106   BILITOT 0.8 05/15/2014 1043       RADIOGRAPHIC STUDIES: No results found. ASSESSMENT AND PLAN: This is a very pleasant 50 years old white male with history of stage III Hodgkin's lymphoma status post 6 cycles of systemic chemotherapy with ABVD with almost complete response. He is currently undergoing salvage chemotherapy with ICE status post 4 cycles and tolerated the first cycle fairly well. This was followed by peripheral blood stem  cell transplant as well as CAR T-cell infusion. Unfortunately recent imaging studies showed evidence for disease progression and the patient is considered for allogeneic stem cell transplant and currently on a search for a donor. It was recommended by the stem cell transplant team to start the patient on treatment with bendamustine and Brentuximab for few cycles until a donor becomes available. I discussed this treatment option with the patient today. I recommended for him treatment with bendamustine 90 MG/M2 on days 1 and 2 in addition to Brentuximab 1.8 mg/kg on days 1 every 3 weeks. He tolerated the first cycle of this treatment fairly well with no significant adverse effects. I recommended for him to proceed with cycle #2 today as a scheduled. I will refer the patient to interventional radiology for consideration of Port-A-Cath placement before his next treatment. He was advised to call immediately if he has any concerning symptoms in the interval. The  patient voices understanding of current disease status and treatment options and is in agreement with the current care plan.  All questions were answered. The patient knows to call the clinic with any problems, questions or concerns. We can certainly see the patient much sooner if necessary.  Disclaimer: This note was dictated with voice recognition software. Similar sounding words can inadvertently be transcribed and may not be corrected upon review.

## 2015-03-20 NOTE — Telephone Encounter (Signed)
Gave adn printed appt sched and avs fo rpt for march

## 2015-03-21 ENCOUNTER — Ambulatory Visit (HOSPITAL_BASED_OUTPATIENT_CLINIC_OR_DEPARTMENT_OTHER): Payer: Commercial Managed Care - PPO

## 2015-03-21 VITALS — BP 132/88 | HR 92 | Temp 98.1°F | Resp 20

## 2015-03-21 DIAGNOSIS — C8122 Mixed cellularity classical Hodgkin lymphoma, intrathoracic lymph nodes: Secondary | ICD-10-CM

## 2015-03-21 DIAGNOSIS — Z5111 Encounter for antineoplastic chemotherapy: Secondary | ICD-10-CM

## 2015-03-21 DIAGNOSIS — C8123 Mixed cellularity classical Hodgkin lymphoma, intra-abdominal lymph nodes: Secondary | ICD-10-CM

## 2015-03-21 DIAGNOSIS — Z5112 Encounter for antineoplastic immunotherapy: Secondary | ICD-10-CM

## 2015-03-21 MED ORDER — SODIUM CHLORIDE 0.9 % IV SOLN
10.0000 mg | Freq: Once | INTRAVENOUS | Status: AC
  Administered 2015-03-21: 10 mg via INTRAVENOUS
  Filled 2015-03-21: qty 1

## 2015-03-21 MED ORDER — SODIUM CHLORIDE 0.9 % IV SOLN
Freq: Once | INTRAVENOUS | Status: AC
  Administered 2015-03-21: 09:00:00 via INTRAVENOUS

## 2015-03-21 MED ORDER — DIPHENHYDRAMINE HCL 50 MG/ML IJ SOLN
50.0000 mg | Freq: Once | INTRAMUSCULAR | Status: AC
  Administered 2015-03-21: 50 mg via INTRAVENOUS

## 2015-03-21 MED ORDER — SODIUM CHLORIDE 0.9 % IV SOLN
90.0000 mg/m2 | Freq: Once | INTRAVENOUS | Status: AC
  Administered 2015-03-21: 200 mg via INTRAVENOUS
  Filled 2015-03-21: qty 8

## 2015-03-21 MED ORDER — DIPHENHYDRAMINE HCL 50 MG/ML IJ SOLN
INTRAMUSCULAR | Status: AC
  Filled 2015-03-21: qty 1

## 2015-03-21 MED ORDER — PALONOSETRON HCL INJECTION 0.25 MG/5ML
0.2500 mg | Freq: Once | INTRAVENOUS | Status: AC
  Administered 2015-03-21: 0.25 mg via INTRAVENOUS

## 2015-03-21 MED ORDER — PALONOSETRON HCL INJECTION 0.25 MG/5ML
INTRAVENOUS | Status: AC
  Filled 2015-03-21: qty 5

## 2015-03-21 MED ORDER — ACETAMINOPHEN 325 MG PO TABS
ORAL_TABLET | ORAL | Status: AC
  Filled 2015-03-21: qty 2

## 2015-03-21 MED ORDER — ACETAMINOPHEN 325 MG PO TABS
650.0000 mg | ORAL_TABLET | Freq: Once | ORAL | Status: AC
  Administered 2015-03-21: 650 mg via ORAL

## 2015-03-21 MED ORDER — SODIUM CHLORIDE 0.9 % IV SOLN
1.8000 mg/kg | Freq: Once | INTRAVENOUS | Status: AC
  Administered 2015-03-21: 180 mg via INTRAVENOUS
  Filled 2015-03-21: qty 36

## 2015-03-21 NOTE — Patient Instructions (Signed)
[place] Discharge Instructions for Patients Receiving Chemotherapy  Today you received the following chemotherapy agents; Brentuximab and Bendamustine.    To help prevent nausea and vomiting after your treatment, we encourage you to take your nausea medication as directed.    If you develop nausea and vomiting that is not controlled by your nausea medication, call the clinic.   BELOW ARE SYMPTOMS THAT SHOULD BE REPORTED IMMEDIATELY:  *FEVER GREATER THAN 100.5 F  *CHILLS WITH OR WITHOUT FEVER  NAUSEA AND VOMITING THAT IS NOT CONTROLLED WITH YOUR NAUSEA MEDICATION  *UNUSUAL SHORTNESS OF BREATH  *UNUSUAL BRUISING OR BLEEDING  TENDERNESS IN MOUTH AND THROAT WITH OR WITHOUT PRESENCE OF ULCERS  *URINARY PROBLEMS  *BOWEL PROBLEMS  UNUSUAL RASH Items with * indicate a potential emergency and should be followed up as soon as possible.  Feel free to call the clinic you have any questions or concerns. The clinic phone number is [phone].  Please show the [place] at check-in to the Emergency Department and triage nurse.

## 2015-03-21 NOTE — Telephone Encounter (Signed)
Left message on pt's identified voicemail to call our office with BP readings

## 2015-03-21 NOTE — Progress Notes (Signed)
Patient has experience problems with IV benadryl in past- flare reaction and pain.  Requested that it be given very slowly.  Benadryl given over 15 minutes .  Patient had some discomfort in shoulder, but otherwise said it was much better than previously. 1048:  Patient states he is fine to drive.

## 2015-03-22 ENCOUNTER — Ambulatory Visit (HOSPITAL_BASED_OUTPATIENT_CLINIC_OR_DEPARTMENT_OTHER): Payer: Commercial Managed Care - PPO

## 2015-03-22 VITALS — BP 128/89 | HR 100 | Temp 97.6°F | Resp 20

## 2015-03-22 DIAGNOSIS — C8123 Mixed cellularity classical Hodgkin lymphoma, intra-abdominal lymph nodes: Secondary | ICD-10-CM | POA: Diagnosis not present

## 2015-03-22 DIAGNOSIS — Z5111 Encounter for antineoplastic chemotherapy: Secondary | ICD-10-CM | POA: Diagnosis not present

## 2015-03-22 DIAGNOSIS — C8122 Mixed cellularity classical Hodgkin lymphoma, intrathoracic lymph nodes: Secondary | ICD-10-CM

## 2015-03-22 MED ORDER — SODIUM CHLORIDE 0.9 % IV SOLN
90.0000 mg/m2 | Freq: Once | INTRAVENOUS | Status: AC
  Administered 2015-03-22: 200 mg via INTRAVENOUS
  Filled 2015-03-22: qty 8

## 2015-03-22 MED ORDER — SODIUM CHLORIDE 0.9 % IV SOLN
10.0000 mg | Freq: Once | INTRAVENOUS | Status: AC
  Administered 2015-03-22: 10 mg via INTRAVENOUS
  Filled 2015-03-22: qty 1

## 2015-03-22 NOTE — Patient Instructions (Signed)
[facility] Discharge Instructions for Patients Receiving Chemotherapy  Today you received the following chemotherapy agents Bendamustine  To help prevent nausea and vomiting after your treatment, we encourage you to take your nausea medication as directed   If you develop nausea and vomiting that is not controlled by your nausea medication, call the clinic.   BELOW ARE SYMPTOMS THAT SHOULD BE REPORTED IMMEDIATELY:  *FEVER GREATER THAN 100.5 F  *CHILLS WITH OR WITHOUT FEVER  NAUSEA AND VOMITING THAT IS NOT CONTROLLED WITH YOUR NAUSEA MEDICATION  *UNUSUAL SHORTNESS OF BREATH  *UNUSUAL BRUISING OR BLEEDING  TENDERNESS IN MOUTH AND THROAT WITH OR WITHOUT PRESENCE OF ULCERS  *URINARY PROBLEMS  *BOWEL PROBLEMS  UNUSUAL RASH Items with * indicate a potential emergency and should be followed up as soon as possible.  Feel free to call the clinic you have any questions or concerns. The clinic phone number is [phone].  Please show the CHEMO ALERT CARD at check-in to the Emergency Department and triage nurse.   

## 2015-03-27 ENCOUNTER — Other Ambulatory Visit: Payer: Self-pay | Admitting: Radiology

## 2015-03-27 ENCOUNTER — Other Ambulatory Visit: Payer: Commercial Managed Care - PPO

## 2015-03-28 ENCOUNTER — Ambulatory Visit (HOSPITAL_COMMUNITY)
Admission: RE | Admit: 2015-03-28 | Discharge: 2015-03-28 | Disposition: A | Discharge status: Home / Self Care | Payer: Commercial Managed Care - PPO | Source: Ambulatory Visit | Attending: Internal Medicine | Admitting: Internal Medicine

## 2015-03-28 ENCOUNTER — Encounter (HOSPITAL_COMMUNITY): Payer: Self-pay

## 2015-03-28 ENCOUNTER — Other Ambulatory Visit (HOSPITAL_BASED_OUTPATIENT_CLINIC_OR_DEPARTMENT_OTHER): Payer: Commercial Managed Care - PPO

## 2015-03-28 ENCOUNTER — Other Ambulatory Visit: Payer: Self-pay | Admitting: Internal Medicine

## 2015-03-28 DIAGNOSIS — R7303 Prediabetes: Secondary | ICD-10-CM | POA: Diagnosis not present

## 2015-03-28 DIAGNOSIS — Z5111 Encounter for antineoplastic chemotherapy: Secondary | ICD-10-CM

## 2015-03-28 DIAGNOSIS — I10 Essential (primary) hypertension: Secondary | ICD-10-CM | POA: Diagnosis not present

## 2015-03-28 DIAGNOSIS — E079 Disorder of thyroid, unspecified: Secondary | ICD-10-CM | POA: Diagnosis not present

## 2015-03-28 DIAGNOSIS — Z7984 Long term (current) use of oral hypoglycemic drugs: Secondary | ICD-10-CM | POA: Diagnosis not present

## 2015-03-28 DIAGNOSIS — C8123 Mixed cellularity classical Hodgkin lymphoma, intra-abdominal lymph nodes: Secondary | ICD-10-CM | POA: Diagnosis not present

## 2015-03-28 DIAGNOSIS — Z7982 Long term (current) use of aspirin: Secondary | ICD-10-CM | POA: Diagnosis not present

## 2015-03-28 DIAGNOSIS — F329 Major depressive disorder, single episode, unspecified: Secondary | ICD-10-CM | POA: Insufficient documentation

## 2015-03-28 DIAGNOSIS — C8100 Nodular lymphocyte predominant Hodgkin lymphoma, unspecified site: Secondary | ICD-10-CM | POA: Insufficient documentation

## 2015-03-28 DIAGNOSIS — C8120 Mixed cellularity classical Hodgkin lymphoma, unspecified site: Secondary | ICD-10-CM

## 2015-03-28 DIAGNOSIS — Z8249 Family history of ischemic heart disease and other diseases of the circulatory system: Secondary | ICD-10-CM | POA: Insufficient documentation

## 2015-03-28 DIAGNOSIS — Z87891 Personal history of nicotine dependence: Secondary | ICD-10-CM | POA: Diagnosis not present

## 2015-03-28 LAB — COMPREHENSIVE METABOLIC PANEL
ALBUMIN: 4.2 g/dL (ref 3.5–5.0)
ALK PHOS: 89 U/L (ref 40–150)
ALT: 59 U/L — ABNORMAL HIGH (ref 0–55)
ANION GAP: 9 meq/L (ref 3–11)
AST: 55 U/L — ABNORMAL HIGH (ref 5–34)
BILIRUBIN TOTAL: 0.74 mg/dL (ref 0.20–1.20)
BUN: 11.3 mg/dL (ref 7.0–26.0)
CO2: 28 meq/L (ref 22–29)
Calcium: 10.1 mg/dL (ref 8.4–10.4)
Chloride: 100 mEq/L (ref 98–109)
Creatinine: 1.4 mg/dL — ABNORMAL HIGH (ref 0.7–1.3)
EGFR: 58 mL/min/{1.73_m2} — AB (ref >90)
Glucose: 254 mg/dl — ABNORMAL HIGH (ref 70–140)
Potassium: 4.4 mEq/L (ref 3.5–5.1)
Sodium: 138 mEq/L (ref 136–145)
TOTAL PROTEIN: 8.2 g/dL (ref 6.4–8.3)

## 2015-03-28 LAB — CBC WITH DIFFERENTIAL/PLATELET
BASO%: 0.9 % (ref 0.0–2.0)
BASOS ABS: 0 10*3/uL (ref 0.0–0.1)
EOS ABS: 0.1 10*3/uL (ref 0.0–0.5)
EOS%: 2.9 % (ref 0.0–7.0)
HCT: 37.5 % — ABNORMAL LOW (ref 38.4–49.9)
HGB: 13.1 g/dL (ref 13.0–17.1)
LYMPH%: 15.1 % (ref 14.0–49.0)
MCH: 32.3 pg (ref 27.2–33.4)
MCHC: 34.9 g/dL (ref 32.0–36.0)
MCV: 92.6 fL (ref 79.3–98.0)
MONO#: 0.2 10*3/uL (ref 0.1–0.9)
MONO%: 4.7 % (ref 0.0–14.0)
NEUT%: 76.4 % — AB (ref 39.0–75.0)
NEUTROS ABS: 3.4 10*3/uL (ref 1.5–6.5)
PLATELETS: 197 10*3/uL (ref 140–400)
RBC: 4.05 10*6/uL — AB (ref 4.20–5.82)
RDW: 13.2 % (ref 11.0–14.6)
WBC: 4.4 10*3/uL (ref 4.0–10.3)
lymph#: 0.7 10*3/uL — ABNORMAL LOW (ref 0.9–3.3)

## 2015-03-28 LAB — PROTIME-INR
INR: 1.15 (ref 0.00–1.49)
PROTHROMBIN TIME: 14.9 s (ref 11.6–15.2)

## 2015-03-28 LAB — APTT: aPTT: 28 seconds (ref 24–37)

## 2015-03-28 MED ORDER — MIDAZOLAM HCL 2 MG/2ML IJ SOLN
INTRAMUSCULAR | Status: AC | PRN
  Administered 2015-03-28 (×4): 1 mg via INTRAVENOUS

## 2015-03-28 MED ORDER — LIDOCAINE-EPINEPHRINE 2 %-1:100000 IJ SOLN
INTRAMUSCULAR | Status: AC
  Filled 2015-03-28: qty 1

## 2015-03-28 MED ORDER — FENTANYL CITRATE (PF) 100 MCG/2ML IJ SOLN
INTRAMUSCULAR | Status: AC
  Filled 2015-03-28: qty 2

## 2015-03-28 MED ORDER — CEFAZOLIN SODIUM-DEXTROSE 2-3 GM-% IV SOLR
2.0000 g | INTRAVENOUS | Status: AC
  Administered 2015-03-28: 2 g via INTRAVENOUS

## 2015-03-28 MED ORDER — HEPARIN SOD (PORK) LOCK FLUSH 100 UNIT/ML IV SOLN
INTRAVENOUS | Status: AC
  Filled 2015-03-28: qty 5

## 2015-03-28 MED ORDER — SODIUM CHLORIDE 0.9 % IV SOLN: Freq: Once | INTRAVENOUS | Status: DC

## 2015-03-28 MED ORDER — FENTANYL CITRATE (PF) 100 MCG/2ML IJ SOLN
INTRAMUSCULAR | Status: AC | PRN
  Administered 2015-03-28: 25 ug via INTRAVENOUS
  Administered 2015-03-28: 50 ug via INTRAVENOUS
  Administered 2015-03-28: 25 ug via INTRAVENOUS

## 2015-03-28 MED ORDER — MIDAZOLAM HCL 2 MG/2ML IJ SOLN
INTRAMUSCULAR | Status: AC
  Filled 2015-03-28: qty 4

## 2015-03-28 MED ORDER — CEFAZOLIN SODIUM-DEXTROSE 2-3 GM-% IV SOLR
INTRAVENOUS | Status: AC
  Filled 2015-03-28: qty 50

## 2015-03-28 MED ORDER — HEPARIN SOD (PORK) LOCK FLUSH 100 UNIT/ML IV SOLN
INTRAVENOUS | Status: AC | PRN
  Administered 2015-03-28: 500 [IU]

## 2015-03-28 NOTE — H&P (Signed)
Chief Complaint: Recurrent Lymphoma  Referring Physician(s): Mohamed,Mohamed  Supervising Physician: Sandi Mariscal  History of Present Illness: [name] [name] is a 50 y.o. male with recurrent Hodgkin's lymphoma who is here today for placement of another Port A Cath.  He had his previous port removed in December by Dr. Pascal Lux as he had completed his chemotherapy and he c/o the port being uncomfortable when he slept.  He is well known to our service.  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 Laurence Ferrari on 12/26/2014 after CTA.  It was stable at that time and follow CTA is recommended December 2017.  He feels well today. He denies any recent illness, fever, chills.  He is NPO and is not taking blood thinners.  Past Medical History  Diagnosis Date  . Hypertension   . Diabetes mellitus without complication (HCC)     Pre-diabetes  . Depression   . Headache(784.0)   . Thyroid disease     Hx: of  . Cancer ([place]) 2015    cervical lymphadenopathy  . Hodgkin lymphoma ([place]) 2015  . Hepatic artery aneurysm ([place]) 2015  . Pain, dental 02/12/2015  . Encounter for antineoplastic chemotherapy 03/20/2015    Past Surgical History  Procedure Laterality Date  . Tonsillectomy    . Mass excision Left 12/31/2012    Procedure: CERVICAL LYMPH NODE BIOPSY;  Surgeon: [name], MD;  Location: [facility];  Service: General;  Laterality: Left;    Allergies: Review of patient's allergies indicates no known allergies.  Medications: Prior to Admission medications   Medication Sig Start Date End Date Taking? Authorizing Provider  aspirin EC 81 MG tablet Take 1 tablet (81 mg total) by mouth daily. 02/23/15  Yes Larey Dresser, MD  Azelastine-Fluticasone [id number] MCG/ACT SUSP Place 1 spray into the nose 2 (two) times daily.   Yes Historical Provider, MD  Cholecalciferol (D 1000) 1000 units capsule Take 1,000 mg by mouth daily.   Yes Historical Provider, MD    fenofibrate (TRICOR) 145 MG tablet Take 1 tablet (145 mg total) by mouth daily. 09/19/14  Yes Isaiah Serge, NP  levothyroxine (SYNTHROID, LEVOTHROID) 200 MCG tablet Take 200 mcg by mouth daily before breakfast.   Yes Historical Provider, MD  lovastatin (ALTOPREV) 40 MG 24 hr tablet Take 40 mg by mouth at bedtime.   Yes Historical Provider, MD  metFORMIN (GLUCOPHAGE) 500 MG tablet Take 500 mg by mouth 2 (two) times daily.  07/28/14  Yes Historical Provider, MD  omega-3 acid ethyl esters (LOVAZA) 1 g capsule Take 2 capsules (2 g total) by mouth 2 (two) times daily. 02/28/15  Yes Larey Dresser, MD  ondansetron (ZOFRAN) 8 MG tablet Every 12 hours as needed for nausea/vomiting.  Begin on day 3 after chemo. 02/21/15  Yes [name], MD  oxyCODONE (OXY IR/ROXICODONE) 5 MG immediate release tablet Take 1 tablet (5 mg total) by mouth every 6 (six) hours as needed for severe pain. 03/16/14  Yes [name], MD  prochlorperazine (COMPAZINE) 10 MG tablet Take 1 tablet (10 mg total) by mouth every 6 (six) hours as needed for nausea or vomiting. 02/21/15  Yes [name], MD  sertraline (ZOLOFT) 100 MG tablet Take 100 mg by mouth daily with breakfast.    Yes Historical Provider, MD  valACYclovir (VALTREX) 500 MG tablet Take 500 mg by mouth daily.  07/28/14 07/28/15 Yes Historical Provider, MD  eszopiclone (LUNESTA) 2 MG TABS tablet Take 2 mg  by mouth at bedtime as needed for sleep. Take immediately before bedtime    Historical Provider, MD  lidocaine-prilocaine (EMLA) cream Apply 1 application topically as needed. 03/20/15   [name], MD  OVER THE COUNTER MEDICATION Take 2.5 mLs by mouth daily. *Isotonix Antioxidant Powder*  Mixes with water    Historical Provider, MD     Family History  Problem Relation Age of Onset  . Aneurysm Mother 2    brain  . Obesity Mother   . Heart disease Mother   . AAA (abdominal aortic aneurysm) Mother     dx in her 12s  . Hodgkin's lymphoma Father     dx in his  57s, and again in his 91s  . Diabetes Father   . Cancer Brother     NOS dx in his 17s  . Bone cancer Maternal Uncle 40    don't know if primary or metastises    Social History   Social History  . Marital Status: Divorced    Spouse Name: N/A  . Number of Children: 0  . Years of Education: N/A   Occupational History  .     Social History Main Topics  . Smoking status: Former Smoker -- 1.00 packs/day for 31 years    Types: Cigarettes    Quit date: 06/07/2012  . Smokeless tobacco: Never Used  . Alcohol Use: No  . Drug Use: No  . Sexual Activity: Not Currently   Other Topics Concern  . None   Social History Narrative    Review of Systems: A 12 point ROS discussed Review of Systems  Constitutional: Negative for fever, chills, activity change, appetite change and fatigue.  HENT: Negative.   Respiratory: Negative for cough, chest tightness, shortness of breath and wheezing.   Cardiovascular: Negative for chest pain.  Gastrointestinal: Negative for nausea, vomiting, abdominal pain and abdominal distention.  Genitourinary: Negative.   Musculoskeletal: Negative.   Skin: Negative.   Neurological: Negative.   Psychiatric/Behavioral: Negative.     Vital Signs: 136/102 Pulse 85 Resp 18  Physical Exam  Constitutional: He is oriented to person, place, and time. He appears well-developed and well-nourished.  HENT:  Head: Normocephalic and atraumatic.  Eyes: EOM are normal.  Neck: Normal range of motion. Neck supple.  Cardiovascular: Normal rate, regular rhythm and normal heart sounds.   No murmur heard. Pulmonary/Chest: Effort normal and breath sounds normal. No respiratory distress. He has no wheezes.  Abdominal: Soft. Bowel sounds are normal. He exhibits no distension. There is no tenderness.  Musculoskeletal: Normal range of motion. He exhibits no tenderness.  Neurological: He is alert and oriented to person, place, and time.  Skin: Skin is warm and dry.    Psychiatric: He has a normal mood and affect. His behavior is normal. Judgment and thought content normal.  Vitals reviewed.   Mallampati Score:  MD Evaluation Airway: WNL Heart: WNL Abdomen: WNL Chest/ Lungs: WNL ASA  Classification: 3 Mallampati/Airway Score: Two  Imaging: No results found.  Labs:  CBC:  Recent Labs  03/07/15 1121 03/14/15 1106 03/20/15 1144 03/28/15 0858  WBC 4.0 3.6* 6.5 4.4  HGB 13.0 13.9 14.2 13.1  HCT 37.7* 40.0 41.7 37.5*  PLT 201 202 226 197    COAGS:  Recent Labs  01/03/15 1407  INR 1.15    BMP:  Recent Labs  04/25/14 0610 04/26/14 0402 04/27/14 0620  05/15/14 1043  03/07/15 1121 03/14/15 1106 03/20/15 1144 03/28/15 0858  NA 134* 134* 133*  < >  133*  < > 137 136 136 138  K 4.0 4.1 3.9  < > 4.5  < > 4.3 4.7 4.2 4.4  CL 100 98 101  --  97*  --   --   --   --   --   CO2 25 24 26   < > 26  < > 27 27 25 28   GLUCOSE 227* 246* 153*  < > 187*  < > 228* 210* 182* 254*  BUN 17 18 18   < > 21*  < > 9.7 9.7 12.4 11.3  CALCIUM 9.4 9.1 9.1  < > 9.5  < > 9.7 9.9 10.0 10.1  CREATININE 0.97 0.97 0.95  < > 1.34*  < > 1.4* 1.4* 1.4* 1.4*  GFRNONAA >90 >90 >90  --  >60  --   --   --   --   --   GFRAA >90 >90 >90  --  >60  --   --   --   --   --   < > = values in this interval not displayed.  LIVER FUNCTION TESTS:  Recent Labs  03/07/15 1121 03/14/15 1106 03/20/15 1144 03/28/15 0858  BILITOT 0.51 0.53 0.60 0.74  AST 50* 59* 49* 55*  ALT 70* 63* 61* 59*  ALKPHOS 85 92 91 89  PROT 8.1 8.3 8.3 8.2  ALBUMIN 4.1 4.3 4.3 4.2    TUMOR MARKERS: No results for input(s): AFPTM, CEA, CA199, CHROMGRNA in the last 8760 hours.  Assessment and Plan:  Recurrent Hodkin's Lymphoma.  Will proceed with placement of tunneled catheter with port today by Dr. Pascal Lux.  Risks and Benefits discussed with the patient including, but not limited to bleeding, infection, pneumothorax, or fibrin sheath development and need for additional  procedures.  All of the patient's questions were answered, patient is agreeable to proceed. Consent signed and in chart.  Thank you for this interesting consult.  I greatly enjoyed meeting [name] [name] and look forward to participating in their care.  A copy of this report was sent to the requesting provider on this date.  Electronically Signed: Murrell Redden PA-C 03/28/2015, 10:12 AM   I spent a total of  25 Minutes in face to face in clinical consultation, greater than 50% of which was counseling/coordinating care for placement of Port A Cath.

## 2015-03-28 NOTE — Sedation Documentation (Signed)
Patient denies pain and is resting comfortably.  

## 2015-03-28 NOTE — Discharge Instructions (Signed)
Implanted Port Insertion, Care After °Refer to this sheet in the next few weeks. These instructions provide you with information on caring for yourself after your procedure. Your health care provider may also give you more specific instructions. Your treatment has been planned according to current medical practices, but problems sometimes occur. Call your health care provider if you have any problems or questions after your procedure. °WHAT TO EXPECT AFTER THE PROCEDURE °After your procedure, it is typical to have the following:  °· Discomfort at the port insertion site. Ice packs to the area will help. °· Bruising on the skin over the port. This will subside in 3-4 days. °HOME CARE INSTRUCTIONS °· After your port is placed, you will get a manufacturer's information card. The card has information about your port. Keep this card with you at all times.   °· Know what kind of port you have. There are many types of ports available.   °· Wear a medical alert bracelet in case of an emergency. This can help alert health care workers that you have a port.   °· The port can stay in for as long as your health care provider believes it is necessary.   °· A home health care nurse may give medicines and take care of the port.   °· You or a family member can get special training and directions for giving medicine and taking care of the port at home.   °SEEK MEDICAL CARE IF:  °· Your port does not flush or you are unable to get a blood return.   °· You have a fever or chills. °SEEK IMMEDIATE MEDICAL CARE IF: °· You have new fluid or pus coming from your incision.   °· You notice a bad smell coming from your incision site.   °· You have swelling, pain, or more redness at the incision or port site.   °· You have chest pain or shortness of breath. °  °This information is not intended to replace advice given to you by your health care provider. Make sure you discuss any questions you have with your health care provider. °  °Document  Released: 10/20/2012 Document Revised: 01/04/2013 Document Reviewed: 10/20/2012 °Elsevier Interactive Patient Education ©2016 [employer] °Implanted Port Home Guide °An implanted port is a type of central line that is placed under the skin. Central lines are used to provide IV access when treatment or nutrition needs to be given through a person's veins. Implanted ports are used for long-term IV access. An implanted port may be placed because:  °· You need IV medicine that would be irritating to the small veins in your hands or arms.   °· You need long-term IV medicines, such as antibiotics.   °· You need IV nutrition for a long period.   °· You need frequent blood draws for lab tests.   °· You need dialysis.   °Implanted ports are usually placed in the chest area, but they can also be placed in the upper arm, the abdomen, or the leg. An implanted port has two main parts:  °· Reservoir. The reservoir is round and will appear as a small, raised area under your skin. The reservoir is the part where a needle is inserted to give medicines or draw blood.   °· Catheter. The catheter is a thin, flexible tube that extends from the reservoir. The catheter is placed into a large vein. Medicine that is inserted into the reservoir goes into the catheter and then into the vein.   °HOW WILL I CARE FOR MY INCISION SITE? °Do not get the   incision site wet. Bathe or shower as directed by your health care provider.  °HOW IS MY PORT ACCESSED? °Special steps must be taken to access the port:  °· Before the port is accessed, a numbing cream can be placed on the skin. This helps numb the skin over the port site.   °· Your health care provider uses a sterile technique to access the port. °· Your health care provider must put on a mask and sterile gloves. °· The skin over your port is cleaned carefully with an antiseptic and allowed to dry. °· The port is gently pinched between sterile gloves, and a needle is inserted into the  port. °· Only "non-coring" port needles should be used to access the port. Once the port is accessed, a blood return should be checked. This helps ensure that the port is in the vein and is not clogged.   °· If your port needs to remain accessed for a constant infusion, a clear (transparent) bandage will be placed over the needle site. The bandage and needle will need to be changed every week, or as directed by your health care provider.   °· Keep the bandage covering the needle clean and dry. Do not get it wet. Follow your health care provider's instructions on how to take a shower or bath while the port is accessed.   °· If your port does not need to stay accessed, no bandage is needed over the port.   °WHAT IS FLUSHING? °Flushing helps keep the port from getting clogged. Follow your health care provider's instructions on how and when to flush the port. Ports are usually flushed with saline solution or a medicine called heparin. The need for flushing will depend on how the port is used.  °· If the port is used for intermittent medicines or blood draws, the port will need to be flushed:   °· After medicines have been given.   °· After blood has been drawn.   °· As part of routine maintenance.   °· If a constant infusion is running, the port may not need to be flushed.   °HOW LONG WILL MY PORT STAY IMPLANTED? °The port can stay in for as long as your health care provider thinks it is needed. When it is time for the port to come out, surgery will be done to remove it. The procedure is similar to the one performed when the port was put in.  °WHEN SHOULD I SEEK IMMEDIATE MEDICAL CARE? °When you have an implanted port, you should seek immediate medical care if:  °· You notice a bad smell coming from the incision site.   °· You have swelling, redness, or drainage at the incision site.   °· You have more swelling or pain at the port site or the surrounding area.   °· You have a fever that is not controlled with  medicine. °  °This information is not intended to replace advice given to you by your health care provider. Make sure you discuss any questions you have with your health care provider. °  °Document Released: 12/30/2004 Document Revised: 10/20/2012 Document Reviewed: 09/06/2012 °Elsevier Interactive Patient Education ©2016 [employer] ° ° °Moderate Conscious Sedation, Adult, Care After °Refer to this sheet in the next few weeks. These instructions provide you with information on caring for yourself after your procedure. Your health care provider may also give you more specific instructions. Your treatment has been planned according to current medical practices, but problems sometimes occur. Call your health care provider if you have any problems or questions   after your procedure. °WHAT TO EXPECT AFTER THE PROCEDURE  °After your procedure: °· You may feel sleepy, clumsy, and have poor balance for several hours. °· Vomiting may occur if you eat too soon after the procedure. °HOME CARE INSTRUCTIONS °· Do not participate in any activities where you could become injured for at least 24 hours. Do not: °¨ Drive. °¨ Swim. °¨ Ride a bicycle. °¨ Operate heavy machinery. °¨ Cook. °¨ Use power tools. °¨ Climb ladders. °¨ Work from a high place. °· Do not make important decisions or sign legal documents until you are improved. °· If you vomit, drink water, juice, or soup when you can drink without vomiting. Make sure you have little or no nausea before eating solid foods. °· Only take over-the-counter or prescription medicines for pain, discomfort, or fever as directed by your health care provider. °· Make sure you and your family fully understand everything about the medicines given to you, including what side effects may occur. °· You should not drink alcohol, take sleeping pills, or take medicines that cause drowsiness for at least 24 hours. °· If you smoke, do not smoke without supervision. °· If you are feeling better, you  may resume normal activities 24 hours after you were sedated. °· Keep all appointments with your health care provider. °SEEK MEDICAL CARE IF: °· Your skin is pale or bluish in color. °· You continue to feel nauseous or vomit. °· Your pain is getting worse and is not helped by medicine. °· You have bleeding or swelling. °· You are still sleepy or feeling clumsy after 24 hours. °SEEK IMMEDIATE MEDICAL CARE IF: °· You develop a rash. °· You have difficulty breathing. °· You develop any type of allergic problem. °· You have a fever. °MAKE SURE YOU: °· Understand these instructions. °· Will watch your condition. °· Will get help right away if you are not doing well or get worse. °  °This information is not intended to replace advice given to you by your health care provider. Make sure you discuss any questions you have with your health care provider. °  °Document Released: 10/20/2012 Document Revised: 01/20/2014 Document Reviewed: 10/20/2012 °Elsevier Interactive Patient Education ©2016 [employer] Inc. ° °

## 2015-03-28 NOTE — Telephone Encounter (Signed)
LMTCB with BP readings

## 2015-03-28 NOTE — Procedures (Signed)
Successful placement of right IJ approach port-a-cath with tip at the superior caval atrial junction. The catheter is ready for immediate use. Estimated Blood Loss: Minimal No immediate post procedural complications.  Jay Undray Allman, MD Pager #: [phone]   

## 2015-03-29 NOTE — Telephone Encounter (Signed)
Spoke with patient who reports the following BP readings: 3/16 132/89 mmHg, pulse 116 bpm 3/15 143/102 3/14 135/90 2/15 132/82  These are the only readings the patient has.  I advised him to take his BP and pulse daily at approximately the same time and record the numbers.  He is to call back next Thursday with 7 consecutive days of readings.  He thanked me for the call.

## 2015-04-02 ENCOUNTER — Telehealth: Payer: Self-pay | Admitting: Medical Oncology

## 2015-04-02 ENCOUNTER — Encounter: Payer: Self-pay | Admitting: Internal Medicine

## 2015-04-02 NOTE — Telephone Encounter (Signed)
Colleen at [facility] called - pt to go for second transplant - this time allogen . Pt needs dental clearance first. Last dental visit was 8 years ago and pt only has 60% of his natural teeth. Pt would like to see Dr Enrique Sack.Note to [place].

## 2015-04-03 ENCOUNTER — Other Ambulatory Visit: Payer: Commercial Managed Care - PPO

## 2015-04-03 ENCOUNTER — Other Ambulatory Visit: Payer: Self-pay | Admitting: Medical Oncology

## 2015-04-03 DIAGNOSIS — C8123 Mixed cellularity classical Hodgkin lymphoma, intra-abdominal lymph nodes: Secondary | ICD-10-CM

## 2015-04-03 NOTE — Progress Notes (Signed)
Referral sent to dental clinic dr Enrique Sack

## 2015-04-04 ENCOUNTER — Other Ambulatory Visit (HOSPITAL_BASED_OUTPATIENT_CLINIC_OR_DEPARTMENT_OTHER): Payer: Commercial Managed Care - PPO

## 2015-04-04 ENCOUNTER — Telehealth: Payer: Self-pay | Admitting: Medical Oncology

## 2015-04-04 DIAGNOSIS — C8122 Mixed cellularity classical Hodgkin lymphoma, intrathoracic lymph nodes: Secondary | ICD-10-CM

## 2015-04-04 DIAGNOSIS — C8123 Mixed cellularity classical Hodgkin lymphoma, intra-abdominal lymph nodes: Secondary | ICD-10-CM | POA: Diagnosis not present

## 2015-04-04 DIAGNOSIS — K0889 Other specified disorders of teeth and supporting structures: Secondary | ICD-10-CM

## 2015-04-04 LAB — CBC WITH DIFFERENTIAL/PLATELET
BASO%: 2.1 % — ABNORMAL HIGH (ref 0.0–2.0)
Basophils Absolute: 0.1 10*3/uL (ref 0.0–0.1)
EOS ABS: 0.1 10*3/uL (ref 0.0–0.5)
EOS%: 4.3 % (ref 0.0–7.0)
HCT: 36.6 % — ABNORMAL LOW (ref 38.4–49.9)
HGB: 12.6 g/dL — ABNORMAL LOW (ref 13.0–17.1)
LYMPH%: 10.8 % — AB (ref 14.0–49.0)
MCH: 32 pg (ref 27.2–33.4)
MCHC: 34.3 g/dL (ref 32.0–36.0)
MCV: 93.2 fL (ref 79.3–98.0)
MONO#: 0.5 10*3/uL (ref 0.1–0.9)
MONO%: 16.6 % — AB (ref 0.0–14.0)
NEUT#: 2.1 10*3/uL (ref 1.5–6.5)
NEUT%: 66.2 % (ref 39.0–75.0)
PLATELETS: 223 10*3/uL (ref 140–400)
RBC: 3.93 10*6/uL — AB (ref 4.20–5.82)
RDW: 14.2 % (ref 11.0–14.6)
WBC: 3.2 10*3/uL — AB (ref 4.0–10.3)
lymph#: 0.4 10*3/uL — ABNORMAL LOW (ref 0.9–3.3)

## 2015-04-04 LAB — COMPREHENSIVE METABOLIC PANEL
ALT: 74 U/L — ABNORMAL HIGH (ref 0–55)
ANION GAP: 11 meq/L (ref 3–11)
AST: 54 U/L — ABNORMAL HIGH (ref 5–34)
Albumin: 3.9 g/dL (ref 3.5–5.0)
Alkaline Phosphatase: 88 U/L (ref 40–150)
BILIRUBIN TOTAL: 0.48 mg/dL (ref 0.20–1.20)
BUN: 12 mg/dL (ref 7.0–26.0)
CHLORIDE: 100 meq/L (ref 98–109)
CO2: 25 meq/L (ref 22–29)
Calcium: 9.6 mg/dL (ref 8.4–10.4)
Creatinine: 1.4 mg/dL — ABNORMAL HIGH (ref 0.7–1.3)
EGFR: 61 mL/min/{1.73_m2} — AB (ref >90)
GLUCOSE: 287 mg/dL — AB (ref 70–140)
POTASSIUM: 4.1 meq/L (ref 3.5–5.1)
SODIUM: 136 meq/L (ref 136–145)
Total Protein: 7.8 g/dL (ref 6.4–8.3)

## 2015-04-04 NOTE — Telephone Encounter (Signed)
Insurance received a bill for drug [id number]- Muromonab -CD-3 for DOS 02/22/15. I told her this is incorrect and will send it back to coders. Pt only received Bendamustine /dex on 02/22/15. Sent message to Engineer, building services. Claim number [id number] claims reimbursement number [id number]

## 2015-04-05 ENCOUNTER — Encounter (HOSPITAL_COMMUNITY): Payer: Self-pay | Admitting: Dentistry

## 2015-04-05 ENCOUNTER — Ambulatory Visit (HOSPITAL_COMMUNITY): Payer: Self-pay | Admitting: Dentistry

## 2015-04-05 VITALS — BP 131/92 | HR 85 | Temp 97.7°F

## 2015-04-05 DIAGNOSIS — K083 Retained dental root: Secondary | ICD-10-CM

## 2015-04-05 DIAGNOSIS — K0401 Reversible pulpitis: Secondary | ICD-10-CM

## 2015-04-05 DIAGNOSIS — K0851 Open restoration margins of tooth: Secondary | ICD-10-CM

## 2015-04-05 DIAGNOSIS — K117 Disturbances of salivary secretion: Secondary | ICD-10-CM

## 2015-04-05 DIAGNOSIS — K08409 Partial loss of teeth, unspecified cause, unspecified class: Secondary | ICD-10-CM

## 2015-04-05 DIAGNOSIS — K0530 Chronic periodontitis, unspecified: Secondary | ICD-10-CM

## 2015-04-05 DIAGNOSIS — K036 Deposits [accretions] on teeth: Secondary | ICD-10-CM

## 2015-04-05 DIAGNOSIS — IMO0002 Reserved for concepts with insufficient information to code with codable children: Secondary | ICD-10-CM

## 2015-04-05 DIAGNOSIS — K0859 Other unsatisfactory restoration of tooth: Secondary | ICD-10-CM

## 2015-04-05 DIAGNOSIS — R682 Dry mouth, unspecified: Secondary | ICD-10-CM

## 2015-04-05 DIAGNOSIS — K045 Chronic apical periodontitis: Secondary | ICD-10-CM

## 2015-04-05 DIAGNOSIS — Z01818 Encounter for other preprocedural examination: Secondary | ICD-10-CM

## 2015-04-05 DIAGNOSIS — K029 Dental caries, unspecified: Secondary | ICD-10-CM

## 2015-04-05 DIAGNOSIS — M264 Malocclusion, unspecified: Secondary | ICD-10-CM

## 2015-04-05 DIAGNOSIS — C8120 Mixed cellularity classical Hodgkin lymphoma, unspecified site: Secondary | ICD-10-CM

## 2015-04-05 NOTE — Patient Instructions (Signed)
Patient referred to an oral surgeon for evaluation for extraction of tooth numbers 3, 12, 18, and 32 and coordinated with current chemotherapy regimen under Dr. Macky Lower care. Patient also will be scheduled for periodontal therapy and dental restoration #11 with dental medicine. Patient will then be referred to primary dentist of his choice for additional dental treatment as indicated.

## 2015-04-05 NOTE — Progress Notes (Signed)
DENTAL CONSULTATION  Date of Consultation:  04/05/2015 Patient Name:   [name] [name] Date of Birth:   Aug 09, 1965 Medical Record Number: [id number]  VITALS: BP 131/92 mmHg  Pulse 85  Temp(Src) 97.7 F (36.5 C) (Oral)  CHIEF COMPLAINT: The patient was referred by Dr. [name] for a dental consultation.   HPI: [name] [name] is a 50 year old male with history of recurrent Hodgkin's lymphoma. Patient was initially diagnosed in December of 2014. Patient then underwent multiple cycles of chemotherapy under the care of Dr. [name]. Patient is status post autologous peripheral blood stem cell transplant in July 2016 at [facility]. The patient subsequently had disease progression that was noted in January of 2017.  Patient is currently undergoing active chemotherapy with bendamustine and brentuximab under the care Dr. [name]. Patient with anticipated allogenic bone marrow transplantation procedure at [facility]. Patient is now seen as part of a medically necessary pre-bone marrow transplant dental protocol examination.  Patient currently has a history of acute pulpitis symptoms involving the upper left and lower left quadrants. Patient indicates that the lower left molar #18 and upper left premolar #12 have been causing dull, achy pain over the past several months. Last episode of pain was approximately 2-3 weeks ago. Patient experienced intensity of 3-4 out of 10 and is currently 0 out of 10 today. Patient indicates that the pain last for hours to days at a time. This occurs in an intermittent fashion. Patient indicates that the pain is brought on by hot and cold alike. Patient uses over-the-counter pain medication as needed with good relief.  The patient indicates that he last saw a dentist in 2008 while he was living in [place]. Patient had a root canal therapy and crown placed on tooth #5. The patient denies having any problems with that tooth at this time.  The  patient does not have a primary dentist in [place].  The patient last had significant dental care while he lived in [place], [place] in approximately 2000 and 2001.   PROBLEM LIST: Patient Active Problem List   Diagnosis Date Noted  . Encounter for antineoplastic chemotherapy 03/20/2015  . CAD (coronary artery disease) 02/25/2015  . Pain, dental 02/12/2015  . Hepatic artery aneurysm ([place])   . Hodgkin's disease ([place]) 04/24/2014  . Abscess 03/23/2014  . Lymphoma ([place]) 03/07/2014  . Abnormal CT scan, chest 09/13/2013  . Shortness of breath 09/13/2013  . Essential hypertension 02/01/2013  . At risk for coronary artery disease 02/01/2013  . Chemotherapy-induced cardiomyopathy ([place]) 02/01/2013  . Diastolic dysfunction [id number]  . Thyroid lesion 01/30/2013  . Family history of cancer 01/28/2013  . Aneurysm artery, hepatic ([place]) 01/24/2013  . DM (diabetes mellitus) ([place]) 01/16/2013  . Hyperlipemia 01/16/2013  . Obesity 01/16/2013  . Hypothyroidism 01/16/2013  . Hodgkin's disease with mixed cellularity ([place]) 01/14/2013  . Cervical lymphadenopathy 12/22/2012    PMH: Past Medical History  Diagnosis Date  . Hypertension   . Diabetes mellitus without complication (HCC)     Pre-diabetes  . Depression   . Headache(784.0)   . Thyroid disease     Hx: of  . Cancer ([place]) 2015    cervical lymphadenopathy  . Hodgkin lymphoma ([place]) 2015  . Hepatic artery aneurysm ([place]) 2015  . Pain, dental 02/12/2015  . Encounter for antineoplastic chemotherapy 03/20/2015    PSH: Past Surgical History  Procedure Laterality Date  . Tonsillectomy    . Mass excision Left 12/31/2012    Procedure: CERVICAL LYMPH NODE  BIOPSY;  Surgeon: [name], MD;  Location: [facility];  Service: General;  Laterality: Left;    ALLERGIES: No Known Allergies  MEDICATIONS: Current Outpatient Prescriptions  Medication Sig Dispense Refill  . aspirin EC 81 MG tablet Take 1 tablet (81 mg total) by mouth daily.    .  Azelastine-Fluticasone 137-50 MCG/ACT SUSP Place 1 spray into the nose 2 (two) times daily.    . Cholecalciferol (D 1000) 1000 units capsule Take 1,000 mg by mouth daily.    . fenofibrate (TRICOR) 145 MG tablet Take 1 tablet (145 mg total) by mouth daily. 30 tablet 6  . levothyroxine (SYNTHROID, LEVOTHROID) 200 MCG tablet Take 200 mcg by mouth daily before breakfast.    . lidocaine-prilocaine (EMLA) cream Apply 1 application topically as needed. 30 g 0  . lovastatin (ALTOPREV) 40 MG 24 hr tablet Take 40 mg by mouth at bedtime.    . metFORMIN (GLUCOPHAGE) 500 MG tablet Take 500 mg by mouth 2 (two) times daily. Two tabs twice a day    . omega-3 acid ethyl esters (LOVAZA) 1 g capsule Take 2 capsules (2 g total) by mouth 2 (two) times daily. 120 capsule 11  . ondansetron (ZOFRAN) 8 MG tablet Every 12 hours as needed for nausea/vomiting.  Begin on day 3 after chemo. 30 tablet 1  . oxyCODONE (OXY IR/ROXICODONE) 5 MG immediate release tablet Take 1 tablet (5 mg total) by mouth every 6 (six) hours as needed for severe pain. 60 tablet 0  . prochlorperazine (COMPAZINE) 10 MG tablet Take 1 tablet (10 mg total) by mouth every 6 (six) hours as needed for nausea or vomiting. 30 tablet 1  . sertraline (ZOLOFT) 100 MG tablet Take 100 mg by mouth daily with breakfast.     . valACYclovir (VALTREX) 500 MG tablet Take 500 mg by mouth daily.     . eszopiclone (LUNESTA) 2 MG TABS tablet Take 2 mg by mouth at bedtime as needed for sleep. Reported on 04/05/2015    . OVER THE COUNTER MEDICATION Take 2.5 mLs by mouth daily. Reported on 04/05/2015     No current facility-administered medications for this visit.    LABS: Lab Results  Component Value Date   WBC 3.2* 04/04/2015   HGB 12.6* 04/04/2015   HCT 36.6* 04/04/2015   MCV 93.2 04/04/2015   PLT 223 04/04/2015      Component Value Date/Time   NA 136 04/04/2015 0824   NA 133* 05/15/2014 1043   K 4.1 04/04/2015 0824   K 4.5 05/15/2014 1043   CL 97* 05/15/2014  1043   CO2 25 04/04/2015 0824   CO2 26 05/15/2014 1043   GLUCOSE 287* 04/04/2015 0824   GLUCOSE 187* 05/15/2014 1043   BUN 12.0 04/04/2015 0824   BUN 21* 05/15/2014 1043   CREATININE 1.4* 04/04/2015 0824   CREATININE 1.34* 05/15/2014 1043   CALCIUM 9.6 04/04/2015 0824   CALCIUM 9.5 05/15/2014 1043   GFRNONAA >60 05/15/2014 1043   GFRAA >60 05/15/2014 1043   Lab Results  Component Value Date   INR 1.15 03/28/2015   INR 1.15 01/03/2015   INR 1.14 02/03/2013   No results found for: PTT  SOCIAL HISTORY: Social History   Social History  . Marital Status: Divorced    Spouse Name: N/A  . Number of Children: 0  . Years of Education: N/A   Occupational History  .     Social History Main Topics  . Smoking status: Former Smoker -- 1.00 packs/day  for 31 years    Types: Cigarettes    Quit date: 06/07/2012  . Smokeless tobacco: Never Used  . Alcohol Use: No  . Drug Use: No  . Sexual Activity: Not Currently   Other Topics Concern  . Not on file   Social History Narrative    FAMILY HISTORY: Family History  Problem Relation Age of Onset  . Aneurysm Mother 90    brain  . Obesity Mother   . Heart disease Mother   . AAA (abdominal aortic aneurysm) Mother     dx in her 59s  . Hodgkin's lymphoma Father     dx in his 58s, and again in his 57s  . Diabetes Father   . Cancer Brother     NOS dx in his 67s  . Bone cancer Maternal Uncle 40    don't know if primary or metastises    REVIEW OF SYSTEMS: Head and neck: As per History of present illness.  Psych: Negative for dental phobia.  DENTAL HISTORY: CHIEF COMPLAINT: The patient was referred by Dr. [name] for a dental consultation.   HPI: [name] [name] is a 50 year old male with history of recurrent Hodgkin's lymphoma. Patient was initially diagnosed in December 2014. Patient then underwent multiple cycles of chemotherapy under the care of Dr. [name]. Patient is status post autologous peripheral  blood stem cell transplant in July 2016 at [facility]. The patient subsequently had disease progression that was noted in January 2017.  Patient is currently undergoing active chemotherapy with bendamustine and brentuximab under the care Dr. [name]. Patient with anticipated allogenic bone marrow transplantation procedure at [facility]. Patient is now seen as part of a medically necessary pre-bone marrow transplant dental protocol.  Patient currently has a history of acute pulpitis symptoms involving the upper left and lower left quadrants. Patient indicates that the lower left molar #18 and upper left premolar #12 have been causing dull, achy pain over the past several months. Last episode of pain was approximately 2-3 weeks ago. Patient experienced intensity of 3-4 out of 10 and is currently 0 out of 10 today. Patient indicates that the pain last for hours to days at a time. This occurs in an intermittent fashion. Patient indicates that the pain is brought on by hot and cold alike. Patient uses over-the-counter pain medication as needed with good relief.  The patient indicates that he last saw a dentist in 2008 while he was living in [place]. Patient had a root canal therapy and crown placed on tooth #5. The patient denies having any problems with that tooth at this time.  The patient does not have a primary dentist in [place].  The patient last had significant dental care while he lived in [place], [place] in approximately 2000 and 2001.  DENTAL EXAMINATION: GENERAL: The patient is a well-developed, well-nourished male in no acute distress. HEAD AND NECK: There is no palpable submandibular lymphadenopathy. There are no thyroid masses. The patient denies acute TMJ symptoms but has left TMJ click /pop on maximum opening. INTRAORAL EXAM: The patient has xerostomia. There is no evidence of oral abscess formation. DENTITION: The patient is missing tooth numbers 1, 16, 17. Tooth #3 is  present as retained root segments. Patient has multiple malpositioned teeth. PERIODONTAL: The patient has chronic periodontitis with plaque and calculus accumulations, selective areas of gingival recession, and mandibular anterior incipient tooth mobility. Periodontal charting was not performed secondary to risk for bleeding with current chemotherapy regimen. DENTAL CARIES/SUBOPTIMAL RESTORATIONS:  There are multiple dental caries were noted as per dental charting form. Some of the caries are extensive with probable pulpal involvement including tooth numbers 12 and 18. ENDODONTIC: The patient has a history of acute pulpitis symptoms.  There is periapical pathology and radiolucency associated with tooth numbers 12 and 18. Patient has had previous root canal therapy associated with tooth numbers 3, 4, 5, 6, 8, 19. CROWN AND BRIDGE: There are multiple crown restorations noted on tooth numbers 4, 5, 6, and 19.  The crown margins on tooth numbers 5 and 19 are suboptimal. PROSTHODONTIC:  The patient has no partial dentures. OCCLUSION: The patient has a poor occlusal scheme but a stable occlusion at this time.  RADIOGRAPHIC INTERPRETATION:  An orthopantogram was taken and supplemented with a full series of dental radiographs There are multiple missing teeth. There are retained root segments in the area #3. Multiple dental caries are noted. Multiple areas of periapical radiolucency iand pathology are associated with tooth numbers 12 and 18. There are previous root canal therapies associated with tooth numbers 3, 4, 5, 6, 8, and 19. Multiple dental restorations are noted and some of these are suboptimal. There is incipient to moderate bone loss noted.   ASSESSMENTS: 1.  Recurrent Hodgkin's lymphoma 2. Active chemotherapy 3. Pre-bone marrow transplantation dental protocol 4. History of acute pulpitis 5. Chronic apical periodontitis 6. Multiple dental caries 7. Retained root segments #3 8. Chronic  periodontitis with bone loss 9. Gingival recession 10. Plaque and calculus accumulations 11. Mandibular anterior incipient tooth mobility 12. Multiple missing teeth 13. Multiple malpositioned teeth 14. Poor occlusal scheme but a stable occlusion 15. Xerostomia 16. Risk for bleeding and infection with invasive dental procedures while undergoing active chemotherapy  PLAN/RECOMMENDATIONS: 1. I discussed the risks, benefits, and complications of various treatment options with the patient in relationship to his medical and dental conditions, active chemotherapy, and anticipated bone marrow transplant.   We discussed various treatment options to include no treatment, multiple extractions with alveoloplasty, pre-prosthetic surgery as indicated, periodontal therapy, dental restorations, root canal therapy, crown and bridge therapy, implant therapy, and replacement of missing teeth as indicated. The patient currently wishes to proceed with referral to an oral surgeon for extraction of tooth numbers 3, 12, 18, and 32 with alveoloplasty as needed.  The patient has been scheduled for a consult appointment with Dr. Benson Norway for April 23, 2015. In the meantime, patient will be seen for initial periodontal therapy and restoration of indicated teeth as discussed with the patient. Patient will then proceed with bone marrow transplantation as indicated after adequate healing from the extractions. Patient will then be referred to a new primary dentist for examination and discussion of other comprehensive dental treatment needs.  2. Discussion of findings with medical team and coordination of future medical and dental care as needed.   I spent in excess of  120 minutes during the conduct of this consultation and >50% of this time involved direct face-to-face encounter for counseling and/or coordination of the patient's care.    [name], DDS

## 2015-04-09 ENCOUNTER — Encounter: Payer: Self-pay | Admitting: Cardiology

## 2015-04-09 ENCOUNTER — Ambulatory Visit (HOSPITAL_COMMUNITY): Payer: Self-pay | Admitting: Dentistry

## 2015-04-09 ENCOUNTER — Encounter (HOSPITAL_COMMUNITY): Payer: Self-pay | Admitting: Dentistry

## 2015-04-09 VITALS — BP 133/84 | HR 84 | Temp 97.7°F

## 2015-04-09 DIAGNOSIS — K029 Dental caries, unspecified: Secondary | ICD-10-CM

## 2015-04-09 DIAGNOSIS — K0851 Open restoration margins of tooth: Secondary | ICD-10-CM

## 2015-04-09 DIAGNOSIS — Z01818 Encounter for other preprocedural examination: Secondary | ICD-10-CM

## 2015-04-09 DIAGNOSIS — C8120 Mixed cellularity classical Hodgkin lymphoma, unspecified site: Secondary | ICD-10-CM

## 2015-04-09 DIAGNOSIS — K0850 Unsatisfactory restoration of tooth, unspecified: Secondary | ICD-10-CM

## 2015-04-09 DIAGNOSIS — K0530 Chronic periodontitis, unspecified: Secondary | ICD-10-CM

## 2015-04-09 DIAGNOSIS — K036 Deposits [accretions] on teeth: Secondary | ICD-10-CM

## 2015-04-09 MED ORDER — SODIUM FLUORIDE 1.1 % DT CREA: TOPICAL_CREAM | DENTAL | Status: AC

## 2015-04-09 NOTE — Patient Instructions (Addendum)
Brush after meals and at bedtime.Brush tongue daily. Floss at bedtime. Use PreviDent 5000 Plus/fluoride toothpaste at bedtime as prescribed. Follow-up with oral surgeon as scheduled. Call if problems arise before then. Dr. Enrique Sack

## 2015-04-09 NOTE — Progress Notes (Signed)
04/09/2015  Patient:            [name] [name] Date of Birth:  1965-09-27 MRN:                [id number]   BP 133/84 mmHg  Pulse 84  Temp(Src) 97.7 F (36.5 C) (Oral)   [name] [name] presents for initial periodontal therapy and dental restoration #11.  I had discussion with Dr. Julien Nordmann and no antibiotic premedication was needed for dental treatment today. Patient denies any acute medical or dental changes and agrees to treatment as planned.  Procedure: Local anesthesia to area #11 with lidocaine with epinephrine via infiltration. Utilized 36 mg of Xylocaine with 0.018 mg of epinephrine. [id number]  Tooth #11  Facial/resin  Acid etch, Primer, Bonding agent, Tetric Ceram EVO Shade A3.0 starting near gum line with A2.0 near incisal portion of restoration. [place]). [id number] Gross Debridement of remaining teeth. Utilized Cavitron to remove significant accretions and sustained. Use selective curettes as needed for additional removal of accretions. [place] ([place]). Floss. Patient tolerated the procedure well. Discussed presents of dental caries, defective restorations, multiple decalcifications, and need for additional dental treatment in the future. Patient to follow-up with oral surgery consultation as scheduled with Dr. Benson Norway.  Surgery will need to be coordinated with medical oncology team. Rx: PreviDent 5000 Plus. Apply small amount to toothbrush at bedtime. Brush teeth for 2 minutes. Spit out excess. Repeat nightly. Dispense one tube. PRN refills. Patient dismissed in stable condition.  [name], DDS

## 2015-04-10 ENCOUNTER — Other Ambulatory Visit: Payer: Commercial Managed Care - PPO

## 2015-04-10 ENCOUNTER — Ambulatory Visit: Payer: Commercial Managed Care - PPO | Admitting: Internal Medicine

## 2015-04-10 ENCOUNTER — Ambulatory Visit: Payer: Commercial Managed Care - PPO

## 2015-04-10 NOTE — Telephone Encounter (Signed)
See MyChart message 04/09/15

## 2015-04-11 ENCOUNTER — Other Ambulatory Visit: Payer: Self-pay | Admitting: Internal Medicine

## 2015-04-11 ENCOUNTER — Ambulatory Visit: Payer: Commercial Managed Care - PPO

## 2015-04-11 ENCOUNTER — Telehealth: Payer: Self-pay | Admitting: Nurse Practitioner

## 2015-04-11 ENCOUNTER — Ambulatory Visit (HOSPITAL_BASED_OUTPATIENT_CLINIC_OR_DEPARTMENT_OTHER): Payer: Commercial Managed Care - PPO

## 2015-04-11 ENCOUNTER — Telehealth: Payer: Self-pay | Admitting: *Deleted

## 2015-04-11 ENCOUNTER — Ambulatory Visit (HOSPITAL_BASED_OUTPATIENT_CLINIC_OR_DEPARTMENT_OTHER): Payer: Commercial Managed Care - PPO | Admitting: Nurse Practitioner

## 2015-04-11 ENCOUNTER — Other Ambulatory Visit (HOSPITAL_BASED_OUTPATIENT_CLINIC_OR_DEPARTMENT_OTHER): Payer: Commercial Managed Care - PPO

## 2015-04-11 VITALS — BP 146/95 | HR 91 | Temp 98.3°F | Resp 18 | Ht 68.0 in | Wt 212.7 lb

## 2015-04-11 DIAGNOSIS — C8120 Mixed cellularity classical Hodgkin lymphoma, unspecified site: Secondary | ICD-10-CM

## 2015-04-11 DIAGNOSIS — K0889 Other specified disorders of teeth and supporting structures: Secondary | ICD-10-CM

## 2015-04-11 DIAGNOSIS — Z5112 Encounter for antineoplastic immunotherapy: Secondary | ICD-10-CM | POA: Diagnosis not present

## 2015-04-11 DIAGNOSIS — C8122 Mixed cellularity classical Hodgkin lymphoma, intrathoracic lymph nodes: Secondary | ICD-10-CM

## 2015-04-11 DIAGNOSIS — Z5111 Encounter for antineoplastic chemotherapy: Secondary | ICD-10-CM | POA: Diagnosis not present

## 2015-04-11 DIAGNOSIS — C8123 Mixed cellularity classical Hodgkin lymphoma, intra-abdominal lymph nodes: Secondary | ICD-10-CM

## 2015-04-11 LAB — COMPREHENSIVE METABOLIC PANEL
ALBUMIN: 4 g/dL (ref 3.5–5.0)
ALK PHOS: 91 U/L (ref 40–150)
ALT: 63 U/L — AB (ref 0–55)
ANION GAP: 10 meq/L (ref 3–11)
AST: 62 U/L — AB (ref 5–34)
BILIRUBIN TOTAL: 0.53 mg/dL (ref 0.20–1.20)
BUN: 12.5 mg/dL (ref 7.0–26.0)
CO2: 26 meq/L (ref 22–29)
CREATININE: 1.3 mg/dL (ref 0.7–1.3)
Calcium: 9.8 mg/dL (ref 8.4–10.4)
Chloride: 102 mEq/L (ref 98–109)
EGFR: 63 mL/min/{1.73_m2} — AB (ref >90)
Glucose: 193 mg/dl — ABNORMAL HIGH (ref 70–140)
Potassium: 4.2 mEq/L (ref 3.5–5.1)
SODIUM: 138 meq/L (ref 136–145)
TOTAL PROTEIN: 8 g/dL (ref 6.4–8.3)

## 2015-04-11 LAB — CBC WITH DIFFERENTIAL/PLATELET
BASO%: 2.3 % — ABNORMAL HIGH (ref 0.0–2.0)
Basophils Absolute: 0.1 10*3/uL (ref 0.0–0.1)
EOS%: 3 % (ref 0.0–7.0)
Eosinophils Absolute: 0.1 10*3/uL (ref 0.0–0.5)
HCT: 40.2 % (ref 38.4–49.9)
HEMOGLOBIN: 13.6 g/dL (ref 13.0–17.1)
LYMPH%: 14.2 % (ref 14.0–49.0)
MCH: 31.9 pg (ref 27.2–33.4)
MCHC: 33.9 g/dL (ref 32.0–36.0)
MCV: 94 fL (ref 79.3–98.0)
MONO#: 0.8 10*3/uL (ref 0.1–0.9)
MONO%: 21.3 % — AB (ref 0.0–14.0)
NEUT%: 59.2 % (ref 39.0–75.0)
NEUTROS ABS: 2.2 10*3/uL (ref 1.5–6.5)
Platelets: 227 10*3/uL (ref 140–400)
RBC: 4.28 10*6/uL (ref 4.20–5.82)
RDW: 14.7 % — AB (ref 11.0–14.6)
WBC: 3.8 10*3/uL — AB (ref 4.0–10.3)
lymph#: 0.5 10*3/uL — ABNORMAL LOW (ref 0.9–3.3)

## 2015-04-11 MED ORDER — ACETAMINOPHEN 325 MG PO TABS
650.0000 mg | ORAL_TABLET | Freq: Once | ORAL | Status: AC
  Administered 2015-04-11: 650 mg via ORAL

## 2015-04-11 MED ORDER — HEPARIN SOD (PORK) LOCK FLUSH 100 UNIT/ML IV SOLN
500.0000 [IU] | Freq: Once | INTRAVENOUS | Status: AC | PRN
  Administered 2015-04-11: 500 [IU]
  Filled 2015-04-11: qty 5

## 2015-04-11 MED ORDER — PALONOSETRON HCL INJECTION 0.25 MG/5ML
INTRAVENOUS | Status: AC
  Filled 2015-04-11: qty 5

## 2015-04-11 MED ORDER — SODIUM CHLORIDE 0.9 % IV SOLN
1.8000 mg/kg | Freq: Once | INTRAVENOUS | Status: AC
  Administered 2015-04-11: 180 mg via INTRAVENOUS
  Filled 2015-04-11: qty 36

## 2015-04-11 MED ORDER — BENDAMUSTINE HCL CHEMO INJECTION 100 MG/4ML
90.0000 mg/m2 | Freq: Once | INTRAVENOUS | Status: AC
  Administered 2015-04-11: 200 mg via INTRAVENOUS
  Filled 2015-04-11: qty 8

## 2015-04-11 MED ORDER — SODIUM CHLORIDE 0.9 % IV SOLN
Freq: Once | INTRAVENOUS | Status: AC
  Administered 2015-04-11: 11:00:00 via INTRAVENOUS

## 2015-04-11 MED ORDER — DIPHENHYDRAMINE HCL 50 MG/ML IJ SOLN
INTRAMUSCULAR | Status: AC
  Filled 2015-04-11: qty 1

## 2015-04-11 MED ORDER — SODIUM CHLORIDE 0.9% FLUSH
10.0000 mL | INTRAVENOUS | Status: DC | PRN
Start: 2015-04-11 — End: 2015-04-11
  Administered 2015-04-11: 10 mL
  Filled 2015-04-11: qty 10

## 2015-04-11 MED ORDER — SODIUM CHLORIDE 0.9 % IV SOLN
10.0000 mg | Freq: Once | INTRAVENOUS | Status: AC
  Administered 2015-04-11: 10 mg via INTRAVENOUS
  Filled 2015-04-11: qty 1

## 2015-04-11 MED ORDER — ACETAMINOPHEN 325 MG PO TABS
ORAL_TABLET | ORAL | Status: AC
  Filled 2015-04-11: qty 2

## 2015-04-11 MED ORDER — DIPHENHYDRAMINE HCL 50 MG/ML IJ SOLN
50.0000 mg | Freq: Once | INTRAMUSCULAR | Status: AC
  Administered 2015-04-11: 50 mg via INTRAVENOUS

## 2015-04-11 MED ORDER — PALONOSETRON HCL INJECTION 0.25 MG/5ML
0.2500 mg | Freq: Once | INTRAVENOUS | Status: AC
  Administered 2015-04-11: 0.25 mg via INTRAVENOUS

## 2015-04-11 NOTE — Telephone Encounter (Signed)
per pof to sch pt appt-sent MW emailto sch trmt-pt to getupdated copy b4 leaving** °

## 2015-04-11 NOTE — Patient Instructions (Signed)
[place] Discharge Instructions for Patients Receiving Chemotherapy  Today you received the following chemotherapy agents; Brentuximab and Bendamustine.    To help prevent nausea and vomiting after your treatment, we encourage you to take your nausea medication as directed.    If you develop nausea and vomiting that is not controlled by your nausea medication, call the clinic.   BELOW ARE SYMPTOMS THAT SHOULD BE REPORTED IMMEDIATELY:  *FEVER GREATER THAN 100.5 F  *CHILLS WITH OR WITHOUT FEVER  NAUSEA AND VOMITING THAT IS NOT CONTROLLED WITH YOUR NAUSEA MEDICATION  *UNUSUAL SHORTNESS OF BREATH  *UNUSUAL BRUISING OR BLEEDING  TENDERNESS IN MOUTH AND THROAT WITH OR WITHOUT PRESENCE OF ULCERS  *URINARY PROBLEMS  *BOWEL PROBLEMS  UNUSUAL RASH Items with * indicate a potential emergency and should be followed up as soon as possible.  Feel free to call the clinic you have any questions or concerns. The clinic phone number is [phone].  Please show the [place] at check-in to the Emergency Department and triage nurse.

## 2015-04-11 NOTE — Telephone Encounter (Signed)
Per staff message and POF I have scheduled appts. Advised scheduler of appts. JMW  

## 2015-04-11 NOTE — Progress Notes (Signed)
  [place] OFFICE PROGRESS NOTE   DIAGNOSIS: Recurrent Hodgkin's lymphoma initially diagnosed as Stage III classical Hodgkin's lymphoma, mixed cellularity subtype diagnosed in December 2014.  PRIOR THERAPY:  1) Status post 6 cycles of systemic chemotherapy with ABVD complicated with pulmonary fibrosis, last dose was given 07/14/2013. 2) Salvage chemotherapy with ICE (ifosfamide/mesna, carboplatin and etoposide) first dose was given 03/06/2014. Status post 4 cycles. Last cycle was given in May 2016. 3) chemotherapy embolization for stem cell transplant with etoposide completed on 06/23/2014. 4) autologous peripheral blood stem cell transplant on 07/18/2014. 5) CAR-T cell infusion on 08/04/2014 on a clinical trial at [facility]. 6) the patient has evidence for disease progression on the recent CT scan of the chest on 01/31/2015 which showed evidence for new mediastinal lymphadenopathy and follow-up PET scan on 02/07/2015 showed hypermetabolic activity of the new lymphadenopathy questionable for disease recurrence.  CURRENT THERAPY: Systemic chemotherapy with bendamustine 90 MG/M2 on days 1 and 2 and Brentuximab 1.8 mg/kg on day 1 every 3 weeks in preparation for allogeneic stem cell transplant. First cycle on 02/21/2015. Cycle 2 03/21/2015  INTERVAL HISTORY:   [name] returns as scheduled. He completed cycle 2 bendamustine/brentuximab beginning 03/21/2015. He had a Port-A-Cath placed on 03/28/2015. He denies nausea/vomiting. No mouth sores. No changes baseline loose stools. He takes Imodium as needed with good control. No numbness or tingling in his hands or feet. He describes his appetite as "fine". Main complaint is fatigue.  Objective:  Vital signs in last 24 hours:  Blood pressure 146/95, pulse 91, temperature 98.3 F (36.8 C), temperature source Oral, resp. rate 18, height 5\' 8"  (1.727 m), weight 212 lb 11.2 oz (96.48 kg), SpO2 97 %.    HEENT: No thrush or  ulcers. Lymphatics: No palpable cervical or supraclavicular lymph nodes. Resp: Lungs clear bilaterally. Cardio: Regular rate and rhythm. GI: Abdomen soft and nontender. No organomegaly. Vascular: No leg edema. Port-A-Cath without erythema.  Lab Results:  Lab Results  Component Value Date   WBC 3.8* 04/11/2015   HGB 13.6 04/11/2015   HCT 40.2 04/11/2015   MCV 94.0 04/11/2015   PLT 227 04/11/2015   NEUTROABS 2.2 04/11/2015    Imaging:  No results found.  Medications: I have reviewed the patient's current medications.  Assessment/Plan: 1. Relapsed Hodgkin's lymphoma currently undergoing treatment with bendamustine and brentuximab, status post 2 cycles, with eventual plans for allogeneic stem cell transplant. 2. Port-A-Cath placement 03/28/2015   Disposition: [name] [name] appears stable. He has completed 2 cycles of bendamustine/brentuximab. Plan to proceed with cycle 3 today as scheduled. He reports he will be returning to [place] 04/30/2015 and 05/02/2015 for restaging studies and follow-up visits. We scheduled a return visit here 05/09/2015. He will contact the office in the interim with any problems.  Plan reviewed with Dr. Julien Nordmann.    Ned Card ANP/GNP-BC   04/11/2015  10:20 AM

## 2015-04-12 ENCOUNTER — Encounter: Payer: Self-pay | Admitting: Nurse Practitioner

## 2015-04-12 ENCOUNTER — Ambulatory Visit (HOSPITAL_BASED_OUTPATIENT_CLINIC_OR_DEPARTMENT_OTHER): Payer: Commercial Managed Care - PPO

## 2015-04-12 ENCOUNTER — Encounter (HOSPITAL_COMMUNITY): Payer: Self-pay | Admitting: Dentistry

## 2015-04-12 VITALS — BP 139/90 | HR 89 | Temp 97.8°F | Resp 20

## 2015-04-12 DIAGNOSIS — Z5111 Encounter for antineoplastic chemotherapy: Secondary | ICD-10-CM

## 2015-04-12 DIAGNOSIS — C8122 Mixed cellularity classical Hodgkin lymphoma, intrathoracic lymph nodes: Secondary | ICD-10-CM

## 2015-04-12 DIAGNOSIS — C8123 Mixed cellularity classical Hodgkin lymphoma, intra-abdominal lymph nodes: Secondary | ICD-10-CM

## 2015-04-12 MED ORDER — HEPARIN SOD (PORK) LOCK FLUSH 100 UNIT/ML IV SOLN
500.0000 [IU] | Freq: Once | INTRAVENOUS | Status: AC
  Administered 2015-04-12: 500 [IU] via INTRAVENOUS
  Filled 2015-04-12: qty 5

## 2015-04-12 MED ORDER — SODIUM CHLORIDE 0.9% FLUSH
10.0000 mL | INTRAVENOUS | Status: DC | PRN
  Administered 2015-04-12: 10 mL via INTRAVENOUS
  Filled 2015-04-12: qty 10

## 2015-04-12 MED ORDER — SODIUM CHLORIDE 0.9 % IV SOLN
10.0000 mg | Freq: Once | INTRAVENOUS | Status: AC
  Administered 2015-04-12: 10 mg via INTRAVENOUS
  Filled 2015-04-12: qty 1

## 2015-04-12 MED ORDER — SODIUM CHLORIDE 0.9 % IV SOLN
90.0000 mg/m2 | Freq: Once | INTRAVENOUS | Status: AC
  Administered 2015-04-12: 200 mg via INTRAVENOUS
  Filled 2015-04-12: qty 8

## 2015-04-12 NOTE — Patient Instructions (Signed)
[facility] Discharge Instructions for Patients Receiving Chemotherapy  Today you received the following chemotherapy agents [place]a.   To help prevent nausea and vomiting after your treatment, we encourage you to take your nausea medication as directed.    If you develop nausea and vomiting that is not controlled by your nausea medication, call the clinic.   BELOW ARE SYMPTOMS THAT SHOULD BE REPORTED IMMEDIATELY:  *FEVER GREATER THAN 100.5 F  *CHILLS WITH OR WITHOUT FEVER  NAUSEA AND VOMITING THAT IS NOT CONTROLLED WITH YOUR NAUSEA MEDICATION  *UNUSUAL SHORTNESS OF BREATH  *UNUSUAL BRUISING OR BLEEDING  TENDERNESS IN MOUTH AND THROAT WITH OR WITHOUT PRESENCE OF ULCERS  *URINARY PROBLEMS  *BOWEL PROBLEMS  UNUSUAL RASH Items with * indicate a potential emergency and should be followed up as soon as possible.  Feel free to call the clinic you have any questions or concerns. The clinic phone number is [phone].  Please show the CHEMO ALERT CARD at check-in to the Emergency Department and triage nurse.   

## 2015-04-12 NOTE — Progress Notes (Signed)
PT DECLINED AVS TODAY 

## 2015-04-18 ENCOUNTER — Other Ambulatory Visit (HOSPITAL_BASED_OUTPATIENT_CLINIC_OR_DEPARTMENT_OTHER): Payer: Commercial Managed Care - PPO

## 2015-04-18 DIAGNOSIS — C8122 Mixed cellularity classical Hodgkin lymphoma, intrathoracic lymph nodes: Secondary | ICD-10-CM | POA: Diagnosis not present

## 2015-04-18 DIAGNOSIS — K0889 Other specified disorders of teeth and supporting structures: Secondary | ICD-10-CM

## 2015-04-18 LAB — CBC WITH DIFFERENTIAL/PLATELET
BASO%: 0.7 % (ref 0.0–2.0)
BASOS ABS: 0 10*3/uL (ref 0.0–0.1)
EOS ABS: 0 10*3/uL (ref 0.0–0.5)
EOS%: 1.3 % (ref 0.0–7.0)
HCT: 36.4 % — ABNORMAL LOW (ref 38.4–49.9)
HGB: 12.9 g/dL — ABNORMAL LOW (ref 13.0–17.1)
LYMPH%: 12.3 % — AB (ref 14.0–49.0)
MCH: 32 pg (ref 27.2–33.4)
MCHC: 35.4 g/dL (ref 32.0–36.0)
MCV: 90.3 fL (ref 79.3–98.0)
MONO#: 0.4 10*3/uL (ref 0.1–0.9)
MONO%: 12.6 % (ref 0.0–14.0)
NEUT#: 2.2 10*3/uL (ref 1.5–6.5)
NEUT%: 73.1 % (ref 39.0–75.0)
PLATELETS: 153 10*3/uL (ref 140–400)
RBC: 4.03 10*6/uL — AB (ref 4.20–5.82)
RDW: 13.3 % (ref 11.0–14.6)
WBC: 3 10*3/uL — AB (ref 4.0–10.3)
lymph#: 0.4 10*3/uL — ABNORMAL LOW (ref 0.9–3.3)

## 2015-04-18 LAB — COMPREHENSIVE METABOLIC PANEL
ALT: 68 U/L — ABNORMAL HIGH (ref 0–55)
ANION GAP: 10 meq/L (ref 3–11)
AST: 68 U/L — ABNORMAL HIGH (ref 5–34)
Albumin: 4 g/dL (ref 3.5–5.0)
Alkaline Phosphatase: 103 U/L (ref 40–150)
BILIRUBIN TOTAL: 0.72 mg/dL (ref 0.20–1.20)
BUN: 11.9 mg/dL (ref 7.0–26.0)
CHLORIDE: 98 meq/L (ref 98–109)
CO2: 27 meq/L (ref 22–29)
Calcium: 9.9 mg/dL (ref 8.4–10.4)
Creatinine: 1.5 mg/dL — ABNORMAL HIGH (ref 0.7–1.3)
EGFR: 53 mL/min/{1.73_m2} — AB (ref >90)
Glucose: 438 mg/dl — ABNORMAL HIGH (ref 70–140)
Potassium: 4.3 mEq/L (ref 3.5–5.1)
Sodium: 134 mEq/L — ABNORMAL LOW (ref 136–145)
TOTAL PROTEIN: 8 g/dL (ref 6.4–8.3)

## 2015-04-25 ENCOUNTER — Other Ambulatory Visit (HOSPITAL_BASED_OUTPATIENT_CLINIC_OR_DEPARTMENT_OTHER): Payer: Commercial Managed Care - PPO

## 2015-04-25 DIAGNOSIS — C8122 Mixed cellularity classical Hodgkin lymphoma, intrathoracic lymph nodes: Secondary | ICD-10-CM

## 2015-04-25 DIAGNOSIS — K0889 Other specified disorders of teeth and supporting structures: Secondary | ICD-10-CM

## 2015-04-25 LAB — CBC WITH DIFFERENTIAL/PLATELET
BASO%: 1.2 % (ref 0.0–2.0)
Basophils Absolute: 0 10*3/uL (ref 0.0–0.1)
EOS%: 2.7 % (ref 0.0–7.0)
Eosinophils Absolute: 0.1 10*3/uL (ref 0.0–0.5)
HCT: 37.1 % — ABNORMAL LOW (ref 38.4–49.9)
HGB: 12.5 g/dL — ABNORMAL LOW (ref 13.0–17.1)
LYMPH%: 6.8 % — AB (ref 14.0–49.0)
MCH: 31.5 pg (ref 27.2–33.4)
MCHC: 33.7 g/dL (ref 32.0–36.0)
MCV: 93.5 fL (ref 79.3–98.0)
MONO#: 0.6 10*3/uL (ref 0.1–0.9)
MONO%: 16.9 % — AB (ref 0.0–14.0)
NEUT%: 72.4 % (ref 39.0–75.0)
NEUTROS ABS: 2.6 10*3/uL (ref 1.5–6.5)
Platelets: 190 10*3/uL (ref 140–400)
RBC: 3.97 10*6/uL — AB (ref 4.20–5.82)
RDW: 14.5 % (ref 11.0–14.6)
WBC: 3.5 10*3/uL — AB (ref 4.0–10.3)
lymph#: 0.2 10*3/uL — ABNORMAL LOW (ref 0.9–3.3)

## 2015-04-25 LAB — COMPREHENSIVE METABOLIC PANEL
ALT: 88 U/L — AB (ref 0–55)
ANION GAP: 10 meq/L (ref 3–11)
AST: 62 U/L — AB (ref 5–34)
Albumin: 4 g/dL (ref 3.5–5.0)
Alkaline Phosphatase: 99 U/L (ref 40–150)
BILIRUBIN TOTAL: 0.71 mg/dL (ref 0.20–1.20)
BUN: 11.7 mg/dL (ref 7.0–26.0)
CHLORIDE: 97 meq/L — AB (ref 98–109)
CO2: 26 meq/L (ref 22–29)
CREATININE: 1.6 mg/dL — AB (ref 0.7–1.3)
Calcium: 10 mg/dL (ref 8.4–10.4)
EGFR: 52 mL/min/{1.73_m2} — ABNORMAL LOW (ref >90)
GLUCOSE: 423 mg/dL — AB (ref 70–140)
Potassium: 4.4 mEq/L (ref 3.5–5.1)
SODIUM: 133 meq/L — AB (ref 136–145)
TOTAL PROTEIN: 8 g/dL (ref 6.4–8.3)

## 2015-04-26 ENCOUNTER — Other Ambulatory Visit (INDEPENDENT_AMBULATORY_CARE_PROVIDER_SITE_OTHER): Payer: Commercial Managed Care - PPO | Admitting: *Deleted

## 2015-04-26 DIAGNOSIS — E785 Hyperlipidemia, unspecified: Secondary | ICD-10-CM | POA: Diagnosis not present

## 2015-04-26 LAB — LIPID PANEL
CHOLESTEROL: 198 mg/dL (ref 125–200)
HDL: 28 mg/dL — ABNORMAL LOW (ref >=40)
TRIGLYCERIDES: 635 mg/dL — AB (ref <150)
Total CHOL/HDL Ratio: 7.1 Ratio — ABNORMAL HIGH (ref <=5.0)

## 2015-04-26 LAB — HEPATIC FUNCTION PANEL
ALK PHOS: 108 U/L (ref 40–115)
ALT: 78 U/L — AB (ref 9–46)
AST: 60 U/L — AB (ref 10–40)
Albumin: 4.4 g/dL (ref 3.6–5.1)
BILIRUBIN INDIRECT: 0.5 mg/dL (ref 0.2–1.2)
Bilirubin, Direct: 0.1 mg/dL (ref <=0.2)
TOTAL PROTEIN: 7.6 g/dL (ref 6.1–8.1)
Total Bilirubin: 0.6 mg/dL (ref 0.2–1.2)

## 2015-04-30 ENCOUNTER — Encounter (HOSPITAL_COMMUNITY): Payer: Self-pay | Admitting: Dentistry

## 2015-05-01 ENCOUNTER — Other Ambulatory Visit (HOSPITAL_BASED_OUTPATIENT_CLINIC_OR_DEPARTMENT_OTHER): Payer: Commercial Managed Care - PPO

## 2015-05-01 DIAGNOSIS — C8122 Mixed cellularity classical Hodgkin lymphoma, intrathoracic lymph nodes: Secondary | ICD-10-CM

## 2015-05-01 DIAGNOSIS — K0889 Other specified disorders of teeth and supporting structures: Secondary | ICD-10-CM

## 2015-05-01 LAB — COMPREHENSIVE METABOLIC PANEL
ALBUMIN: 4 g/dL (ref 3.5–5.0)
ALK PHOS: 92 U/L (ref 40–150)
ALT: 57 U/L — ABNORMAL HIGH (ref 0–55)
ANION GAP: 10 meq/L (ref 3–11)
AST: 61 U/L — AB (ref 5–34)
BUN: 13.7 mg/dL (ref 7.0–26.0)
CALCIUM: 10.4 mg/dL (ref 8.4–10.4)
CHLORIDE: 99 meq/L (ref 98–109)
CO2: 26 mEq/L (ref 22–29)
Creatinine: 1.6 mg/dL — ABNORMAL HIGH (ref 0.7–1.3)
EGFR: 51 mL/min/{1.73_m2} — AB (ref >90)
Glucose: 402 mg/dl — ABNORMAL HIGH (ref 70–140)
POTASSIUM: 4.9 meq/L (ref 3.5–5.1)
Sodium: 134 mEq/L — ABNORMAL LOW (ref 136–145)
Total Bilirubin: 0.7 mg/dL (ref 0.20–1.20)
Total Protein: 8 g/dL (ref 6.4–8.3)

## 2015-05-01 LAB — CBC WITH DIFFERENTIAL/PLATELET
BASO%: 2.6 % — AB (ref 0.0–2.0)
Basophils Absolute: 0.1 10*3/uL (ref 0.0–0.1)
EOS%: 3 % (ref 0.0–7.0)
Eosinophils Absolute: 0.1 10*3/uL (ref 0.0–0.5)
HCT: 38.3 % — ABNORMAL LOW (ref 38.4–49.9)
HGB: 13 g/dL (ref 13.0–17.1)
LYMPH%: 11.2 % — AB (ref 14.0–49.0)
MCH: 31.7 pg (ref 27.2–33.4)
MCHC: 33.8 g/dL (ref 32.0–36.0)
MCV: 93.8 fL (ref 79.3–98.0)
MONO#: 0.5 10*3/uL (ref 0.1–0.9)
MONO%: 17.1 % — AB (ref 0.0–14.0)
NEUT%: 66.1 % (ref 39.0–75.0)
NEUTROS ABS: 2 10*3/uL (ref 1.5–6.5)
Platelets: 225 10*3/uL (ref 140–400)
RBC: 4.09 10*6/uL — ABNORMAL LOW (ref 4.20–5.82)
RDW: 15 % — ABNORMAL HIGH (ref 11.0–14.6)
WBC: 3.1 10*3/uL — AB (ref 4.0–10.3)
lymph#: 0.3 10*3/uL — ABNORMAL LOW (ref 0.9–3.3)

## 2015-05-08 ENCOUNTER — Telehealth: Payer: Self-pay | Admitting: Internal Medicine

## 2015-05-08 ENCOUNTER — Telehealth: Payer: Self-pay | Admitting: *Deleted

## 2015-05-08 ENCOUNTER — Ambulatory Visit (HOSPITAL_BASED_OUTPATIENT_CLINIC_OR_DEPARTMENT_OTHER): Payer: Commercial Managed Care - PPO | Admitting: Internal Medicine

## 2015-05-08 ENCOUNTER — Other Ambulatory Visit (HOSPITAL_BASED_OUTPATIENT_CLINIC_OR_DEPARTMENT_OTHER): Payer: Commercial Managed Care - PPO

## 2015-05-08 ENCOUNTER — Encounter: Payer: Self-pay | Admitting: Internal Medicine

## 2015-05-08 ENCOUNTER — Ambulatory Visit (HOSPITAL_BASED_OUTPATIENT_CLINIC_OR_DEPARTMENT_OTHER): Payer: Commercial Managed Care - PPO

## 2015-05-08 VITALS — BP 121/86 | HR 84 | Temp 98.4°F | Resp 17 | Ht 68.0 in | Wt 208.1 lb

## 2015-05-08 DIAGNOSIS — C8122 Mixed cellularity classical Hodgkin lymphoma, intrathoracic lymph nodes: Secondary | ICD-10-CM

## 2015-05-08 DIAGNOSIS — Z5111 Encounter for antineoplastic chemotherapy: Secondary | ICD-10-CM

## 2015-05-08 DIAGNOSIS — Z5112 Encounter for antineoplastic immunotherapy: Secondary | ICD-10-CM | POA: Diagnosis not present

## 2015-05-08 DIAGNOSIS — C8118 Nodular sclerosis classical Hodgkin lymphoma, lymph nodes of multiple sites: Secondary | ICD-10-CM

## 2015-05-08 DIAGNOSIS — K0889 Other specified disorders of teeth and supporting structures: Secondary | ICD-10-CM

## 2015-05-08 DIAGNOSIS — C8123 Mixed cellularity classical Hodgkin lymphoma, intra-abdominal lymph nodes: Secondary | ICD-10-CM

## 2015-05-08 LAB — CBC WITH DIFFERENTIAL/PLATELET
BASO%: 2.4 % — AB (ref 0.0–2.0)
Basophils Absolute: 0.1 10*3/uL (ref 0.0–0.1)
EOS%: 2.1 % (ref 0.0–7.0)
Eosinophils Absolute: 0.1 10*3/uL (ref 0.0–0.5)
HEMATOCRIT: 39.1 % (ref 38.4–49.9)
HEMOGLOBIN: 13.3 g/dL (ref 13.0–17.1)
LYMPH#: 0.3 10*3/uL — AB (ref 0.9–3.3)
LYMPH%: 7.4 % — ABNORMAL LOW (ref 14.0–49.0)
MCH: 32.4 pg (ref 27.2–33.4)
MCHC: 34 g/dL (ref 32.0–36.0)
MCV: 95.1 fL (ref 79.3–98.0)
MONO#: 0.6 10*3/uL (ref 0.1–0.9)
MONO%: 14 % (ref 0.0–14.0)
NEUT#: 3.3 10*3/uL (ref 1.5–6.5)
NEUT%: 74.1 % (ref 39.0–75.0)
Platelets: 215 10*3/uL (ref 140–400)
RBC: 4.12 10*6/uL — ABNORMAL LOW (ref 4.20–5.82)
RDW: 15.1 % — AB (ref 11.0–14.6)
WBC: 4.4 10*3/uL (ref 4.0–10.3)

## 2015-05-08 LAB — COMPREHENSIVE METABOLIC PANEL
ALT: 51 U/L (ref 0–55)
AST: 52 U/L — AB (ref 5–34)
Albumin: 4 g/dL (ref 3.5–5.0)
Alkaline Phosphatase: 103 U/L (ref 40–150)
Anion Gap: 8 mEq/L (ref 3–11)
BUN: 13.8 mg/dL (ref 7.0–26.0)
CALCIUM: 9.9 mg/dL (ref 8.4–10.4)
CHLORIDE: 100 meq/L (ref 98–109)
CO2: 27 meq/L (ref 22–29)
CREATININE: 1.4 mg/dL — AB (ref 0.7–1.3)
EGFR: 61 mL/min/{1.73_m2} — ABNORMAL LOW (ref >90)
Glucose: 268 mg/dl — ABNORMAL HIGH (ref 70–140)
Potassium: 4.2 mEq/L (ref 3.5–5.1)
Sodium: 136 mEq/L (ref 136–145)
TOTAL PROTEIN: 8.2 g/dL (ref 6.4–8.3)
Total Bilirubin: 0.45 mg/dL (ref 0.20–1.20)

## 2015-05-08 MED ORDER — SODIUM CHLORIDE 0.9 % IV SOLN
90.0000 mg/m2 | Freq: Once | INTRAVENOUS | Status: AC
  Administered 2015-05-08: 200 mg via INTRAVENOUS
  Filled 2015-05-08: qty 8

## 2015-05-08 MED ORDER — ACETAMINOPHEN 325 MG PO TABS
650.0000 mg | ORAL_TABLET | Freq: Once | ORAL | Status: AC
  Administered 2015-05-08: 650 mg via ORAL

## 2015-05-08 MED ORDER — PALONOSETRON HCL INJECTION 0.25 MG/5ML
INTRAVENOUS | Status: AC
  Filled 2015-05-08: qty 5

## 2015-05-08 MED ORDER — ACETAMINOPHEN 325 MG PO TABS
ORAL_TABLET | ORAL | Status: AC
  Filled 2015-05-08: qty 2

## 2015-05-08 MED ORDER — HEPARIN SOD (PORK) LOCK FLUSH 100 UNIT/ML IV SOLN
500.0000 [IU] | Freq: Once | INTRAVENOUS | Status: AC | PRN
Start: 2015-05-08 — End: 2015-05-08
  Administered 2015-05-08: 500 [IU]
  Filled 2015-05-08: qty 5

## 2015-05-08 MED ORDER — PALONOSETRON HCL INJECTION 0.25 MG/5ML
0.2500 mg | Freq: Once | INTRAVENOUS | Status: AC
  Administered 2015-05-08: 0.25 mg via INTRAVENOUS

## 2015-05-08 MED ORDER — DIPHENHYDRAMINE HCL 50 MG/ML IJ SOLN
50.0000 mg | Freq: Once | INTRAMUSCULAR | Status: AC
  Administered 2015-05-08: 50 mg via INTRAVENOUS

## 2015-05-08 MED ORDER — SODIUM CHLORIDE 0.9 % IV SOLN
1.8000 mg/kg | Freq: Once | INTRAVENOUS | Status: AC
Start: 2015-05-08 — End: 2015-05-08
  Administered 2015-05-08: 180 mg via INTRAVENOUS
  Filled 2015-05-08: qty 36

## 2015-05-08 MED ORDER — DIPHENHYDRAMINE HCL 50 MG/ML IJ SOLN
INTRAMUSCULAR | Status: AC
  Filled 2015-05-08: qty 1

## 2015-05-08 MED ORDER — SODIUM CHLORIDE 0.9% FLUSH
10.0000 mL | INTRAVENOUS | Status: DC | PRN
  Administered 2015-05-08: 10 mL
  Filled 2015-05-08: qty 10

## 2015-05-08 MED ORDER — DEXAMETHASONE SODIUM PHOSPHATE 100 MG/10ML IJ SOLN
10.0000 mg | Freq: Once | INTRAMUSCULAR | Status: AC
  Administered 2015-05-08: 10 mg via INTRAVENOUS
  Filled 2015-05-08: qty 1

## 2015-05-08 MED ORDER — SODIUM CHLORIDE 0.9 % IV SOLN
Freq: Once | INTRAVENOUS | Status: AC
  Administered 2015-05-08: 11:00:00 via INTRAVENOUS

## 2015-05-08 NOTE — Progress Notes (Signed)
[facility] [phone]   [phone]  OFFICE PROGRESS NOTE  Pcp Not In System No address on file  DIAGNOSIS: Recurrent Hodgkin's lymphoma initially diagnosed as Stage III classical Hodgkin's lymphoma, mixed cellularity subtype diagnosed in December 2014.  PRIOR THERAPY:  1) Status post 6 cycles of systemic chemotherapy with ABVD complicated with pulmonary fibrosis, last dose was given 07/14/2013. 2) Salvage chemotherapy with ICE (ifosfamide/mesna, carboplatin and etoposide) first dose was given 03/06/2014. Status post 4 cycles. Last cycle was given in May 2016. 3) chemotherapy embolization for stem cell transplant with etoposide completed on 06/23/2014. 4) autologous peripheral blood stem cell transplant on 07/18/2014. 5) CAR-T cell infusion on 08/04/2014 on a clinical trial at [facility]. 6) the patient has evidence for disease progression on the recent CT scan of the chest on 01/31/2015 which showed evidence for new mediastinal lymphadenopathy and follow-up PET scan on general 25th 2017 showed hypermetabolic activity of the new lymphadenopathy questionable for disease recurrence.  CURRENT THERAPY: Systemic chemotherapy with bendamustine 90 MG/M2 on days 1 and 2 and Brentuximab 1.8 mg/kg on day 1 every 3 weeks in preparation for allogenic stem cell transplant. First cycle on 02/21/2015. Status post 3 cycles.  INTERVAL HISTORY: [name] [name] 50 y.o. male returns to the clinic today for follow-up visit. The patient is feeling fine today with no specific complaints. He tolerated the third cycle of his treatment with bendamustine and Brentuximab fairly well with no significant adverse effects. He is currently in the process of evaluation for allogeneic stem cell transplant but this procedure is on hold until the patient can find a caregiver for after the transplant care. His family lives in [place] and he is trying to see if he can get the  transplant done at [facility]. Dr. Evette Doffing recommended for the patient to proceed with 2 more cycles of systemic chemotherapy. He denied having any significant nausea or vomiting. He has no fever or chills. He denied having any significant weight loss or night sweats. The patient has no significant chest pain, shortness of breath, cough or hemoptysis. He is here today to start cycle #4 of his treatment.   MEDICAL HISTORY: Past Medical History  Diagnosis Date  . Hypertension   . Diabetes mellitus without complication (HCC)     Pre-diabetes  . Depression   . Headache(784.0)   . Thyroid disease     Hx: of  . Cancer ([place]) 2015    cervical lymphadenopathy  . Hodgkin lymphoma ([place]) 2015  . Hepatic artery aneurysm ([place]) 2015  . Pain, dental 02/12/2015  . Encounter for antineoplastic chemotherapy 03/20/2015    ALLERGIES:  has No Known Allergies.  MEDICATIONS:  Current Outpatient Prescriptions  Medication Sig Dispense Refill  . aspirin EC 81 MG tablet Take 1 tablet (81 mg total) by mouth daily.    . Azelastine-Fluticasone 137-50 MCG/ACT SUSP Place 1 spray into the nose 2 (two) times daily.    . Cholecalciferol (D 1000) 1000 units capsule Take 1,000 mg by mouth daily.    . eszopiclone (LUNESTA) 2 MG TABS tablet Take 2 mg by mouth at bedtime as needed for sleep. Reported on 04/05/2015    . fenofibrate (TRICOR) 145 MG tablet Take 1 tablet (145 mg total) by mouth daily. 30 tablet 6  . insulin glargine (LANTUS) 100 UNIT/ML injection Inject 15 Units into the skin at bedtime.    . insulin lispro (HUMALOG) 100 UNIT/ML injection Inject into the skin 2 times  daily at 12 noon and 4 pm. Sliding scale for dose    . levothyroxine (SYNTHROID, LEVOTHROID) 200 MCG tablet Take 200 mcg by mouth daily before breakfast.    . lidocaine-prilocaine (EMLA) cream Apply 1 application topically as needed. 30 g 0  . lovastatin (ALTOPREV) 40 MG 24 hr tablet Take 40 mg by mouth at bedtime.    . metFORMIN (GLUCOPHAGE) 500 MG tablet  Take 500 mg by mouth 2 (two) times daily. Reported on 04/11/2015    . omega-3 acid ethyl esters (LOVAZA) 1 g capsule Take 2 capsules (2 g total) by mouth 2 (two) times daily. 120 capsule 11  . ondansetron (ZOFRAN) 8 MG tablet Every 12 hours as needed for nausea/vomiting.  Begin on day 3 after chemo. 30 tablet 1  . OVER THE COUNTER MEDICATION Take 2.5 mLs by mouth daily. Reported on 04/05/2015    . oxyCODONE (OXY IR/ROXICODONE) 5 MG immediate release tablet Take 1 tablet (5 mg total) by mouth every 6 (six) hours as needed for severe pain. 60 tablet 0  . prochlorperazine (COMPAZINE) 10 MG tablet Take 1 tablet (10 mg total) by mouth every 6 (six) hours as needed for nausea or vomiting. 30 tablet 1  . sertraline (ZOLOFT) 100 MG tablet Take 100 mg by mouth daily with breakfast.     . sodium fluoride (PREVIDENT 5000 PLUS) 1.1 % CREA dental cream Apply fluoride to tooth brush. Brush teeth for 2 minutes. Spit out excess-DO NOT swallow. Repeat nightly. 1 Tube prn  . valACYclovir (VALTREX) 500 MG tablet Take 500 mg by mouth daily.      No current facility-administered medications for this visit.    SURGICAL HISTORY:  Past Surgical History  Procedure Laterality Date  . Tonsillectomy    . Mass excision Left 12/31/2012    Procedure: CERVICAL LYMPH NODE BIOPSY;  Surgeon: [name], MD;  Location: [facility];  Service: General;  Laterality: Left;    REVIEW OF SYSTEMS:  A comprehensive review of systems was negative.   PHYSICAL EXAMINATION: General appearance: alert, cooperative and no distress Head: Normocephalic, without obvious abnormality, atraumatic Neck: no JVD, supple, symmetrical, trachea midline, thyroid not enlarged, symmetric, no tenderness/mass/nodules and Palpable small left lower cervical lymph node, firm to palpation Lymph nodes: Palpable small left lower cervical lymph node, firm to palpation Resp: clear to auscultation bilaterally Back: symmetric, no curvature. ROM normal. No CVA  tenderness. Cardio: regular rate and rhythm, S1, S2 normal, no murmur, click, rub or gallop GI: soft, non-tender; bowel sounds normal; no masses,  no organomegaly Extremities: extremities normal, atraumatic, no cyanosis or edema Neurologic: Alert and oriented X 3, normal strength and tone. Normal symmetric reflexes. Normal coordination and gait  ECOG PERFORMANCE STATUS: 0 - Asymptomatic  Blood pressure 121/86, pulse 84, temperature 98.4 F (36.9 C), temperature source Oral, resp. rate 17, height 5\' 8"  (1.727 m), weight 208 lb 1.6 oz (94.394 kg), SpO2 97 %.  LABORATORY DATA: Lab Results  Component Value Date   WBC 4.4 05/08/2015   HGB 13.3 05/08/2015   HCT 39.1 05/08/2015   MCV 95.1 05/08/2015   PLT 215 05/08/2015      Chemistry      Component Value Date/Time   NA 134* 05/01/2015 1153   NA 133* 05/15/2014 1043   K 4.9 05/01/2015 1153   K 4.5 05/15/2014 1043   CL 97* 05/15/2014 1043   CO2 26 05/01/2015 1153   CO2 26 05/15/2014 1043   BUN 13.7 05/01/2015 1153  BUN 21* 05/15/2014 1043   CREATININE 1.6* 05/01/2015 1153   CREATININE 1.34* 05/15/2014 1043      Component Value Date/Time   CALCIUM 10.4 05/01/2015 1153   CALCIUM 9.5 05/15/2014 1043   ALKPHOS 92 05/01/2015 1153   ALKPHOS 108 04/26/2015 0838   AST 61* 05/01/2015 1153   AST 60* 04/26/2015 0838   ALT 57* 05/01/2015 1153   ALT 78* 04/26/2015 0838   BILITOT 0.70 05/01/2015 1153   BILITOT 0.6 04/26/2015 0838       RADIOGRAPHIC STUDIES: No results found. ASSESSMENT AND PLAN: This is a very pleasant 50 years old white male with history of stage III Hodgkin's lymphoma status post 6 cycles of systemic chemotherapy with ABVD with almost complete response. He is currently undergoing salvage chemotherapy with ICE status post 4 cycles and tolerated the first cycle fairly well. This was followed by peripheral blood stem cell transplant as well as CAR T-cell infusion. Unfortunately recent imaging studies showed evidence  for disease progression and the patient is considered for allogeneic stem cell transplant and currently on a search for a donor. It was recommended by the stem cell transplant team to start the patient on treatment with bendamustine and Brentuximab for few cycles until a donor becomes available. I discussed this treatment option with the patient today. I recommended for him treatment with bendamustine 90 MG/M2 on days 1 and 2 in addition to Brentuximab 1.8 mg/kg on days 1 every 3 weeks.He is status post 3 cycles. He tolerated the third cycle of this treatment fairly well with no significant adverse effects. I recommended for him to proceed with cycle #4 today as a scheduled. He would come back for follow-up visit in 3 weeks for evaluation before starting cycle #5. He was advised to call immediately if he has any concerning symptoms in the interval. The patient voices understanding of current disease status and treatment options and is in agreement with the current care plan.  All questions were answered. The patient knows to call the clinic with any problems, questions or concerns. We can certainly see the patient much sooner if necessary.  Disclaimer: This note was dictated with voice recognition software. Similar sounding words can inadvertently be transcribed and may not be corrected upon review.

## 2015-05-08 NOTE — Telephone Encounter (Signed)
Per staff message and POF I have scheduled appts. Advised scheduler of appts. JMW  

## 2015-05-08 NOTE — Telephone Encounter (Signed)
Gave and printed appt sched and avs for pt for April and May °

## 2015-05-08 NOTE — Patient Instructions (Signed)
[place] Discharge Instructions for Patients Receiving Chemotherapy  Today you received the following chemotherapy agents; Brentuximab and Bendamustine.    To help prevent nausea and vomiting after your treatment, we encourage you to take your nausea medication as directed.    If you develop nausea and vomiting that is not controlled by your nausea medication, call the clinic.   BELOW ARE SYMPTOMS THAT SHOULD BE REPORTED IMMEDIATELY:  *FEVER GREATER THAN 100.5 F  *CHILLS WITH OR WITHOUT FEVER  NAUSEA AND VOMITING THAT IS NOT CONTROLLED WITH YOUR NAUSEA MEDICATION  *UNUSUAL SHORTNESS OF BREATH  *UNUSUAL BRUISING OR BLEEDING  TENDERNESS IN MOUTH AND THROAT WITH OR WITHOUT PRESENCE OF ULCERS  *URINARY PROBLEMS  *BOWEL PROBLEMS  UNUSUAL RASH Items with * indicate a potential emergency and should be followed up as soon as possible.  Feel free to call the clinic you have any questions or concerns. The clinic phone number is [phone].  Please show the [place] at check-in to the Emergency Department and triage nurse.

## 2015-05-09 ENCOUNTER — Ambulatory Visit (HOSPITAL_BASED_OUTPATIENT_CLINIC_OR_DEPARTMENT_OTHER): Payer: Commercial Managed Care - PPO

## 2015-05-09 VITALS — BP 127/84 | HR 85 | Temp 98.4°F | Resp 20

## 2015-05-09 DIAGNOSIS — C8123 Mixed cellularity classical Hodgkin lymphoma, intra-abdominal lymph nodes: Secondary | ICD-10-CM

## 2015-05-09 DIAGNOSIS — C8122 Mixed cellularity classical Hodgkin lymphoma, intrathoracic lymph nodes: Secondary | ICD-10-CM

## 2015-05-09 DIAGNOSIS — C8118 Nodular sclerosis classical Hodgkin lymphoma, lymph nodes of multiple sites: Secondary | ICD-10-CM

## 2015-05-09 DIAGNOSIS — Z5112 Encounter for antineoplastic immunotherapy: Secondary | ICD-10-CM

## 2015-05-09 DIAGNOSIS — Z452 Encounter for adjustment and management of vascular access device: Secondary | ICD-10-CM

## 2015-05-09 MED ORDER — HEPARIN SOD (PORK) LOCK FLUSH 100 UNIT/ML IV SOLN
500.0000 [IU] | Freq: Once | INTRAVENOUS | Status: AC
  Administered 2015-05-09: 500 [IU] via INTRAVENOUS
  Filled 2015-05-09: qty 5

## 2015-05-09 MED ORDER — DEXAMETHASONE SODIUM PHOSPHATE 100 MG/10ML IJ SOLN
10.0000 mg | Freq: Once | INTRAMUSCULAR | Status: AC
  Administered 2015-05-09: 10 mg via INTRAVENOUS
  Filled 2015-05-09: qty 1

## 2015-05-09 MED ORDER — SODIUM CHLORIDE 0.9 % IV SOLN
90.0000 mg/m2 | Freq: Once | INTRAVENOUS | Status: AC
  Administered 2015-05-09: 200 mg via INTRAVENOUS
  Filled 2015-05-09: qty 8

## 2015-05-09 MED ORDER — SODIUM CHLORIDE 0.9% FLUSH
10.0000 mL | INTRAVENOUS | Status: DC | PRN
  Administered 2015-05-09: 10 mL via INTRAVENOUS
  Filled 2015-05-09: qty 10

## 2015-05-09 MED ORDER — ALTEPLASE 2 MG IJ SOLR
2.0000 mg | Freq: Once | INTRAMUSCULAR | Status: AC | PRN
  Administered 2015-05-09: 2 mg
  Filled 2015-05-09: qty 2

## 2015-05-15 ENCOUNTER — Other Ambulatory Visit (HOSPITAL_BASED_OUTPATIENT_CLINIC_OR_DEPARTMENT_OTHER): Payer: Commercial Managed Care - PPO

## 2015-05-15 DIAGNOSIS — C8122 Mixed cellularity classical Hodgkin lymphoma, intrathoracic lymph nodes: Secondary | ICD-10-CM | POA: Diagnosis not present

## 2015-05-15 DIAGNOSIS — K0889 Other specified disorders of teeth and supporting structures: Secondary | ICD-10-CM

## 2015-05-15 LAB — CBC WITH DIFFERENTIAL/PLATELET
BASO%: 1.2 % (ref 0.0–2.0)
Basophils Absolute: 0.1 10*3/uL (ref 0.0–0.1)
EOS ABS: 0.2 10*3/uL (ref 0.0–0.5)
EOS%: 4.6 % (ref 0.0–7.0)
HCT: 33.1 % — ABNORMAL LOW (ref 38.4–49.9)
HGB: 11.9 g/dL — ABNORMAL LOW (ref 13.0–17.1)
LYMPH%: 11 % — AB (ref 14.0–49.0)
MCH: 32.1 pg (ref 27.2–33.4)
MCHC: 36 g/dL (ref 32.0–36.0)
MCV: 89.2 fL (ref 79.3–98.0)
MONO#: 0.3 10*3/uL (ref 0.1–0.9)
MONO%: 6.6 % (ref 0.0–14.0)
NEUT#: 3.1 10*3/uL (ref 1.5–6.5)
NEUT%: 76.6 % — AB (ref 39.0–75.0)
PLATELETS: 171 10*3/uL (ref 140–400)
RBC: 3.71 10*6/uL — AB (ref 4.20–5.82)
RDW: 13.8 % (ref 11.0–14.6)
WBC: 4.1 10*3/uL (ref 4.0–10.3)
lymph#: 0.5 10*3/uL — ABNORMAL LOW (ref 0.9–3.3)

## 2015-05-15 LAB — COMPREHENSIVE METABOLIC PANEL
ALT: 53 U/L (ref 0–55)
ANION GAP: 10 meq/L (ref 3–11)
AST: 49 U/L — ABNORMAL HIGH (ref 5–34)
Albumin: 4 g/dL (ref 3.5–5.0)
Alkaline Phosphatase: 86 U/L (ref 40–150)
BUN: 9.8 mg/dL (ref 7.0–26.0)
CHLORIDE: 98 meq/L (ref 98–109)
CO2: 25 meq/L (ref 22–29)
Calcium: 9.8 mg/dL (ref 8.4–10.4)
Creatinine: 1.5 mg/dL — ABNORMAL HIGH (ref 0.7–1.3)
EGFR: 55 mL/min/{1.73_m2} — AB (ref >90)
GLUCOSE: 327 mg/dL — AB (ref 70–140)
POTASSIUM: 4 meq/L (ref 3.5–5.1)
SODIUM: 133 meq/L — AB (ref 136–145)
TOTAL PROTEIN: 7.8 g/dL (ref 6.4–8.3)
Total Bilirubin: 0.84 mg/dL (ref 0.20–1.20)

## 2015-05-16 ENCOUNTER — Other Ambulatory Visit: Payer: Self-pay

## 2015-05-22 ENCOUNTER — Encounter: Payer: Self-pay | Admitting: *Deleted

## 2015-05-22 ENCOUNTER — Other Ambulatory Visit (HOSPITAL_BASED_OUTPATIENT_CLINIC_OR_DEPARTMENT_OTHER): Payer: Commercial Managed Care - PPO

## 2015-05-22 DIAGNOSIS — C8122 Mixed cellularity classical Hodgkin lymphoma, intrathoracic lymph nodes: Secondary | ICD-10-CM | POA: Diagnosis not present

## 2015-05-22 DIAGNOSIS — K0889 Other specified disorders of teeth and supporting structures: Secondary | ICD-10-CM

## 2015-05-22 LAB — COMPREHENSIVE METABOLIC PANEL
ALBUMIN: 4.1 g/dL (ref 3.5–5.0)
ALK PHOS: 100 U/L (ref 40–150)
ALT: 56 U/L — ABNORMAL HIGH (ref 0–55)
ANION GAP: 10 meq/L (ref 3–11)
AST: 58 U/L — ABNORMAL HIGH (ref 5–34)
BUN: 14.4 mg/dL (ref 7.0–26.0)
CALCIUM: 10 mg/dL (ref 8.4–10.4)
CO2: 25 mEq/L (ref 22–29)
Chloride: 99 mEq/L (ref 98–109)
Creatinine: 1.5 mg/dL — ABNORMAL HIGH (ref 0.7–1.3)
EGFR: 54 mL/min/{1.73_m2} — AB (ref >90)
Glucose: 388 mg/dl — ABNORMAL HIGH (ref 70–140)
POTASSIUM: 4.3 meq/L (ref 3.5–5.1)
Sodium: 135 mEq/L — ABNORMAL LOW (ref 136–145)
Total Bilirubin: 0.57 mg/dL (ref 0.20–1.20)
Total Protein: 8 g/dL (ref 6.4–8.3)

## 2015-05-22 LAB — CBC WITH DIFFERENTIAL/PLATELET
BASO%: 1.4 % (ref 0.0–2.0)
BASOS ABS: 0.1 10*3/uL (ref 0.0–0.1)
EOS ABS: 0.2 10*3/uL (ref 0.0–0.5)
EOS%: 4.9 % (ref 0.0–7.0)
HEMATOCRIT: 36.6 % — AB (ref 38.4–49.9)
HEMOGLOBIN: 12.5 g/dL — AB (ref 13.0–17.1)
LYMPH#: 0.4 10*3/uL — AB (ref 0.9–3.3)
LYMPH%: 9.2 % — ABNORMAL LOW (ref 14.0–49.0)
MCH: 32 pg (ref 27.2–33.4)
MCHC: 34.2 g/dL (ref 32.0–36.0)
MCV: 93.5 fL (ref 79.3–98.0)
MONO#: 0.6 10*3/uL (ref 0.1–0.9)
MONO%: 14.8 % — ABNORMAL HIGH (ref 0.0–14.0)
NEUT#: 2.9 10*3/uL (ref 1.5–6.5)
NEUT%: 69.7 % (ref 39.0–75.0)
Platelets: 244 10*3/uL (ref 140–400)
RBC: 3.91 10*6/uL — ABNORMAL LOW (ref 4.20–5.82)
RDW: 15.6 % — ABNORMAL HIGH (ref 11.0–14.6)
WBC: 4.2 10*3/uL (ref 4.0–10.3)

## 2015-05-24 ENCOUNTER — Encounter: Payer: Self-pay | Admitting: *Deleted

## 2015-05-24 ENCOUNTER — Encounter: Payer: Self-pay | Admitting: Internal Medicine

## 2015-05-27 ENCOUNTER — Encounter: Payer: Self-pay | Admitting: Internal Medicine

## 2015-05-28 ENCOUNTER — Encounter: Payer: Self-pay | Admitting: Nurse Practitioner

## 2015-05-29 ENCOUNTER — Telehealth: Payer: Self-pay | Admitting: Internal Medicine

## 2015-05-29 ENCOUNTER — Ambulatory Visit: Payer: Self-pay

## 2015-05-29 ENCOUNTER — Ambulatory Visit: Payer: Self-pay | Admitting: Internal Medicine

## 2015-05-29 ENCOUNTER — Other Ambulatory Visit: Payer: Self-pay

## 2015-05-29 ENCOUNTER — Other Ambulatory Visit: Payer: Self-pay | Admitting: *Deleted

## 2015-05-29 NOTE — Progress Notes (Signed)
Per MD pt lab/Md/Chemo will need to be rescheduled for next week. POF sent Called pt lmovm to expect a call from scheduling.

## 2015-05-29 NOTE — Progress Notes (Signed)
Pt called advised his Dr who will be doing his Bone Marrow in [place] would like him to have another treatment next week.Pt states he was available any day next week for lab/MD/chemo Reviewed with MD.

## 2015-05-29 NOTE — Telephone Encounter (Signed)
per pof to sch pt appt-sent MW email to r/s missed chemo-*will call pt after reply

## 2015-05-30 ENCOUNTER — Telehealth: Payer: Self-pay | Admitting: *Deleted

## 2015-05-30 ENCOUNTER — Telehealth: Payer: Self-pay | Admitting: Internal Medicine

## 2015-05-30 ENCOUNTER — Ambulatory Visit: Payer: Self-pay

## 2015-05-30 NOTE — Telephone Encounter (Signed)
Per staff message and POF I have scheduled appts. Advised scheduler of appts. JMW  

## 2015-05-30 NOTE — Telephone Encounter (Signed)
cld pt and lef tmessage of time & date of appt for 5/22 @9 :24

## 2015-05-30 NOTE — Telephone Encounter (Signed)
First available given on 5/22

## 2015-06-04 ENCOUNTER — Encounter: Payer: Self-pay | Admitting: Oncology

## 2015-06-04 ENCOUNTER — Ambulatory Visit (HOSPITAL_BASED_OUTPATIENT_CLINIC_OR_DEPARTMENT_OTHER): Payer: Commercial Managed Care - PPO

## 2015-06-04 ENCOUNTER — Other Ambulatory Visit: Payer: Self-pay | Admitting: *Deleted

## 2015-06-04 ENCOUNTER — Other Ambulatory Visit (HOSPITAL_BASED_OUTPATIENT_CLINIC_OR_DEPARTMENT_OTHER): Payer: Commercial Managed Care - PPO

## 2015-06-04 ENCOUNTER — Other Ambulatory Visit: Payer: Self-pay

## 2015-06-04 ENCOUNTER — Ambulatory Visit (HOSPITAL_BASED_OUTPATIENT_CLINIC_OR_DEPARTMENT_OTHER): Payer: Commercial Managed Care - PPO | Admitting: Oncology

## 2015-06-04 ENCOUNTER — Ambulatory Visit: Payer: Self-pay | Admitting: Oncology

## 2015-06-04 VITALS — BP 131/86 | HR 89 | Temp 98.4°F | Resp 18 | Ht 68.0 in | Wt 205.4 lb

## 2015-06-04 DIAGNOSIS — C8120 Mixed cellularity classical Hodgkin lymphoma, unspecified site: Secondary | ICD-10-CM

## 2015-06-04 DIAGNOSIS — K0889 Other specified disorders of teeth and supporting structures: Secondary | ICD-10-CM

## 2015-06-04 DIAGNOSIS — R739 Hyperglycemia, unspecified: Secondary | ICD-10-CM | POA: Diagnosis not present

## 2015-06-04 DIAGNOSIS — C8122 Mixed cellularity classical Hodgkin lymphoma, intrathoracic lymph nodes: Secondary | ICD-10-CM | POA: Diagnosis not present

## 2015-06-04 DIAGNOSIS — C8123 Mixed cellularity classical Hodgkin lymphoma, intra-abdominal lymph nodes: Secondary | ICD-10-CM

## 2015-06-04 DIAGNOSIS — Z5112 Encounter for antineoplastic immunotherapy: Secondary | ICD-10-CM | POA: Diagnosis not present

## 2015-06-04 DIAGNOSIS — R944 Abnormal results of kidney function studies: Secondary | ICD-10-CM | POA: Diagnosis not present

## 2015-06-04 LAB — COMPREHENSIVE METABOLIC PANEL
ALBUMIN: 4 g/dL (ref 3.5–5.0)
ALK PHOS: 103 U/L (ref 40–150)
ALT: 47 U/L (ref 0–55)
ANION GAP: 10 meq/L (ref 3–11)
AST: 46 U/L — AB (ref 5–34)
BUN: 13.7 mg/dL (ref 7.0–26.0)
CALCIUM: 10.1 mg/dL (ref 8.4–10.4)
CHLORIDE: 100 meq/L (ref 98–109)
CO2: 26 mEq/L (ref 22–29)
Creatinine: 1.6 mg/dL — ABNORMAL HIGH (ref 0.7–1.3)
EGFR: 49 mL/min/{1.73_m2} — AB (ref >90)
Glucose: 405 mg/dl — ABNORMAL HIGH (ref 70–140)
POTASSIUM: 4.5 meq/L (ref 3.5–5.1)
Sodium: 135 mEq/L — ABNORMAL LOW (ref 136–145)
Total Bilirubin: 0.42 mg/dL (ref 0.20–1.20)
Total Protein: 7.9 g/dL (ref 6.4–8.3)

## 2015-06-04 LAB — CBC WITH DIFFERENTIAL/PLATELET
BASO%: 2.5 % — AB (ref 0.0–2.0)
BASOS ABS: 0.1 10*3/uL (ref 0.0–0.1)
EOS%: 2.8 % (ref 0.0–7.0)
Eosinophils Absolute: 0.1 10*3/uL (ref 0.0–0.5)
HEMATOCRIT: 36.7 % — AB (ref 38.4–49.9)
HEMOGLOBIN: 13.1 g/dL (ref 13.0–17.1)
LYMPH%: 7.3 % — AB (ref 14.0–49.0)
MCH: 33.2 pg (ref 27.2–33.4)
MCHC: 35.7 g/dL (ref 32.0–36.0)
MCV: 93.1 fL (ref 79.3–98.0)
MONO#: 0.6 10*3/uL (ref 0.1–0.9)
MONO%: 17.1 % — ABNORMAL HIGH (ref 0.0–14.0)
NEUT#: 2.5 10*3/uL (ref 1.5–6.5)
NEUT%: 70.3 % (ref 39.0–75.0)
PLATELETS: 170 10*3/uL (ref 140–400)
RBC: 3.94 10*6/uL — ABNORMAL LOW (ref 4.20–5.82)
RDW: 14.5 % (ref 11.0–14.6)
WBC: 3.6 10*3/uL — ABNORMAL LOW (ref 4.0–10.3)
lymph#: 0.3 10*3/uL — ABNORMAL LOW (ref 0.9–3.3)
nRBC: 0 % (ref 0–0)

## 2015-06-04 MED ORDER — PALONOSETRON HCL INJECTION 0.25 MG/5ML
INTRAVENOUS | Status: AC
  Filled 2015-06-04: qty 5

## 2015-06-04 MED ORDER — DIPHENHYDRAMINE HCL 50 MG/ML IJ SOLN
INTRAMUSCULAR | Status: AC
  Filled 2015-06-04: qty 1

## 2015-06-04 MED ORDER — DIPHENHYDRAMINE HCL 25 MG PO CAPS
ORAL_CAPSULE | ORAL | Status: AC
  Filled 2015-06-04: qty 2

## 2015-06-04 MED ORDER — SODIUM CHLORIDE 0.9 % IV SOLN
10.0000 mg | Freq: Once | INTRAVENOUS | Status: AC
  Administered 2015-06-04: 10 mg via INTRAVENOUS
  Filled 2015-06-04: qty 1

## 2015-06-04 MED ORDER — DIPHENHYDRAMINE HCL 50 MG/ML IJ SOLN
50.0000 mg | Freq: Once | INTRAMUSCULAR | Status: AC
  Administered 2015-06-04: 50 mg via INTRAVENOUS

## 2015-06-04 MED ORDER — ACETAMINOPHEN 325 MG PO TABS
650.0000 mg | ORAL_TABLET | Freq: Once | ORAL | Status: AC
  Administered 2015-06-04: 650 mg via ORAL

## 2015-06-04 MED ORDER — SODIUM CHLORIDE 0.9 % IV SOLN
1.8000 mg/kg | Freq: Once | INTRAVENOUS | Status: AC
  Administered 2015-06-04: 180 mg via INTRAVENOUS
  Filled 2015-06-04: qty 36

## 2015-06-04 MED ORDER — LIDOCAINE-PRILOCAINE 2.5-2.5 % EX CREA: 1.0000 "application " | TOPICAL_CREAM | CUTANEOUS | Status: AC | PRN

## 2015-06-04 MED ORDER — SODIUM CHLORIDE 0.9 % IV SOLN
90.0000 mg/m2 | Freq: Once | INTRAVENOUS | Status: AC
  Administered 2015-06-04: 200 mg via INTRAVENOUS
  Filled 2015-06-04: qty 8

## 2015-06-04 MED ORDER — SODIUM CHLORIDE 0.9% FLUSH
10.0000 mL | INTRAVENOUS | Status: DC | PRN
  Administered 2015-06-04: 10 mL
  Filled 2015-06-04: qty 10

## 2015-06-04 MED ORDER — HEPARIN SOD (PORK) LOCK FLUSH 100 UNIT/ML IV SOLN
500.0000 [IU] | Freq: Once | INTRAVENOUS | Status: AC | PRN
  Administered 2015-06-04: 500 [IU]
  Filled 2015-06-04: qty 5

## 2015-06-04 MED ORDER — ACETAMINOPHEN 325 MG PO TABS
ORAL_TABLET | ORAL | Status: AC
  Filled 2015-06-04: qty 2

## 2015-06-04 MED ORDER — SODIUM CHLORIDE 0.9 % IV SOLN
Freq: Once | INTRAVENOUS | Status: AC
  Administered 2015-06-04: 12:00:00 via INTRAVENOUS

## 2015-06-04 MED ORDER — PALONOSETRON HCL INJECTION 0.25 MG/5ML
0.2500 mg | Freq: Once | INTRAVENOUS | Status: AC
  Administered 2015-06-04: 0.25 mg via INTRAVENOUS

## 2015-06-04 NOTE — Progress Notes (Signed)
[name] [phone]   Fax:(336) [phone]  OFFICE PROGRESS NOTE  Pcp Not In System No address on file  DIAGNOSIS: Recurrent Hodgkin's lymphoma initially diagnosed as Stage III classical Hodgkin's lymphoma, mixed cellularity subtype diagnosed in December 2014.  PRIOR THERAPY:  1) Status post 6 cycles of systemic chemotherapy with ABVD complicated with pulmonary fibrosis, last dose was given 07/14/2013. 2) Salvage chemotherapy with ICE (ifosfamide/mesna, carboplatin and etoposide) first dose was given 03/06/2014. Status post 4 cycles. Last cycle was given in May 2016. 3) chemotherapy embolization for stem cell transplant with etoposide completed on 06/23/2014. 4) autologous peripheral blood stem cell transplant on 07/18/2014. 5) CAR-T cell infusion on 08/04/2014 on a clinical trial at [facility]. 6) the patient has evidence for disease progression on the recent CT scan of the chest on 01/31/2015 which showed evidence for new mediastinal lymphadenopathy and follow-up PET scan on general 25th 2017 showed hypermetabolic activity of the new lymphadenopathy questionable for disease recurrence.  CURRENT THERAPY: Systemic chemotherapy with bendamustine 90 MG/M2 on days 1 and 2 and Brentuximab 1.8 mg/kg on day 1 every 3 weeks in preparation for allogenic stem cell transplant. First cycle on 02/21/2015. Status post 4 cycles.  INTERVAL HISTORY: [name] [name] 51 y.o. male returns to the clinic today for follow-up visit. The patient is feeling fine today with no specific complaints. He tolerated the fourth cycle of his treatment with bendamustine and Brentuximab fairly well with no significant adverse effects. He is currently in the process of evaluation for allogeneic stem cell transplant. Was seen at [facility] and indicates that he will likely have his transplant within the next 4-6 weeks. Date is not currently set. He denied having any significant nausea or vomiting.  He has no fever or chills. He denied having any significant weight loss or night sweats. The patient has no significant chest pain, shortness of breath, cough or hemoptysis. He is here today to start cycle #5 of his treatment.   MEDICAL HISTORY: Past Medical History  Diagnosis Date  . Hypertension   . Diabetes mellitus without complication (HCC)     Pre-diabetes  . Depression   . Headache(784.0)   . Thyroid disease     Hx: of  . Cancer ([place]) 2015    cervical lymphadenopathy  . Hodgkin lymphoma ([place]) 2015  . Hepatic artery aneurysm ([place]) 2015  . Pain, dental 02/12/2015  . Encounter for antineoplastic chemotherapy 03/20/2015    ALLERGIES:  has No Known Allergies.  MEDICATIONS:  Current Outpatient Prescriptions  Medication Sig Dispense Refill  . aspirin EC 81 MG tablet Take 1 tablet (81 mg total) by mouth daily.    . Azelastine-Fluticasone 137-50 MCG/ACT SUSP Place 1 spray into the nose 2 (two) times daily.    . Cholecalciferol (D 1000) 1000 units capsule Take 1,000 mg by mouth daily.    . eszopiclone (LUNESTA) 2 MG TABS tablet Take 2 mg by mouth at bedtime as needed for sleep. Reported on 04/05/2015    . fenofibrate (TRICOR) 145 MG tablet Take 1 tablet (145 mg total) by mouth daily. 30 tablet 6  . insulin glargine (LANTUS) 100 UNIT/ML injection Inject 15 Units into the skin at bedtime.    . insulin lispro (HUMALOG) 100 UNIT/ML injection Inject into the skin 2 times daily at 12 noon and 4 pm. Sliding scale for dose    . levothyroxine (SYNTHROID, LEVOTHROID) 200 MCG tablet Take 200 mcg by mouth daily before breakfast.    .  lovastatin (ALTOPREV) 40 MG 24 hr tablet Take 40 mg by mouth at bedtime.    . metFORMIN (GLUCOPHAGE) 500 MG tablet Take 500 mg by mouth 2 (two) times daily. Reported on 04/11/2015    . omega-3 acid ethyl esters (LOVAZA) 1 g capsule Take 2 capsules (2 g total) by mouth 2 (two) times daily. 120 capsule 11  . ondansetron (ZOFRAN) 8 MG tablet Every 12 hours as needed for  nausea/vomiting.  Begin on day 3 after chemo. 30 tablet 1  . OVER THE COUNTER MEDICATION Take 2.5 mLs by mouth daily. Reported on 04/05/2015    . oxyCODONE (OXY IR/ROXICODONE) 5 MG immediate release tablet Take 1 tablet (5 mg total) by mouth every 6 (six) hours as needed for severe pain. 60 tablet 0  . prochlorperazine (COMPAZINE) 10 MG tablet Take 1 tablet (10 mg total) by mouth every 6 (six) hours as needed for nausea or vomiting. 30 tablet 1  . sertraline (ZOLOFT) 100 MG tablet Take 100 mg by mouth daily with breakfast.     . sodium fluoride (PREVIDENT 5000 PLUS) 1.1 % CREA dental cream Apply fluoride to tooth brush. Brush teeth for 2 minutes. Spit out excess-DO NOT swallow. Repeat nightly. 1 Tube prn  . valACYclovir (VALTREX) 500 MG tablet Take 500 mg by mouth daily.     Marland Kitchen lidocaine-prilocaine (EMLA) cream Apply 1 application topically as needed. 30 g 2   No current facility-administered medications for this visit.   Facility-Administered Medications Ordered in Other Visits  Medication Dose Route Frequency Provider Last Rate Last Dose  . 0.9 %  sodium chloride infusion   Intravenous Once [name], MD   Stopped at 06/04/15 1551  . sodium chloride flush (NS) 0.9 % injection 10 mL  10 mL Intracatheter PRN [name], MD   10 mL at 06/04/15 1354    SURGICAL HISTORY:  Past Surgical History  Procedure Laterality Date  . Tonsillectomy    . Mass excision Left 12/31/2012    Procedure: CERVICAL LYMPH NODE BIOPSY;  Surgeon: [name], MD;  Location: [facility];  Service: General;  Laterality: Left;    REVIEW OF SYSTEMS:  A comprehensive review of systems was negative.   PHYSICAL EXAMINATION: General appearance: alert, cooperative and no distress Head: Normocephalic, without obvious abnormality, atraumatic Neck: no JVD, supple, symmetrical, trachea midline, thyroid not enlarged, symmetric, no tenderness/mass/nodules and Palpable small left lower cervical lymph node, firm to  palpation Lymph nodes: Palpable small left lower cervical lymph node, firm to palpation Resp: clear to auscultation bilaterally Back: symmetric, no curvature. ROM normal. No CVA tenderness. Cardio: regular rate and rhythm, S1, S2 normal, no murmur, click, rub or gallop GI: soft, non-tender; bowel sounds normal; no masses,  no organomegaly Extremities: extremities normal, atraumatic, no cyanosis or edema Neurologic: Alert and oriented X 3, normal strength and tone. Normal symmetric reflexes. Normal coordination and gait  ECOG PERFORMANCE STATUS: 0 - Asymptomatic  Blood pressure 131/86, pulse 89, temperature 98.4 F (36.9 C), temperature source Oral, resp. rate 18, height 5\' 8"  (1.727 m), weight 205 lb 6.4 oz (93.169 kg), SpO2 96 %.  LABORATORY DATA: Lab Results  Component Value Date   WBC 3.6* 06/04/2015   HGB 13.1 06/04/2015   HCT 36.7* 06/04/2015   MCV 93.1 06/04/2015   PLT 170 06/04/2015      Chemistry      Component Value Date/Time   NA 135* 06/04/2015 0924   NA 133* 05/15/2014 1043   K 4.5 06/04/2015 [id number]  K 4.5 05/15/2014 1043   CL 97* 05/15/2014 1043   CO2 26 06/04/2015 0924   CO2 26 05/15/2014 1043   BUN 13.7 06/04/2015 0924   BUN 21* 05/15/2014 1043   CREATININE 1.6* 06/04/2015 0924   CREATININE 1.34* 05/15/2014 1043      Component Value Date/Time   CALCIUM 10.1 06/04/2015 0924   CALCIUM 9.5 05/15/2014 1043   ALKPHOS 103 06/04/2015 0924   ALKPHOS 108 04/26/2015 0838   AST 46* 06/04/2015 0924   AST 60* 04/26/2015 0838   ALT 47 06/04/2015 0924   ALT 78* 04/26/2015 0838   BILITOT 0.42 06/04/2015 0924   BILITOT 0.6 04/26/2015 0838       RADIOGRAPHIC STUDIES: No results found. ASSESSMENT AND PLAN: This is a very pleasant 50 year old white male with history of stage III Hodgkin's lymphoma status post 6 cycles of systemic chemotherapy with ABVD with almost complete response. He is currently undergoing salvage chemotherapy with ICE status post 4 cycles and  tolerated the first cycle fairly well. This was followed by peripheral blood stem cell transplant as well as CAR T-cell infusion. Unfortunately recent imaging studies showed evidence for disease progression and the patient is considered for allogeneic stem cell transplant and currently on a search for a donor. It was recommended by the stem cell transplant team to start the patient on treatment with bendamustine and Brentuximab until transplant.  The patient was seen and examined with Dr. Julien Nordmann. The patient will proceed with cycle 5 of chemotherapy with bendamustine 90 MG/M2 on days 1 and 2 in addition to Brentuximab 1.8 mg/kg on days 1 every 3 weeks. He is status post 4 cycles.  Since the patient will likely have a transplant soon, we have not scheduled him back for routine visit. He will contact us when he returns from [place] and we will get him back for follow-up at that time. He will let us know if Gertie Fey needs any lab work or imaging studies to be performed in anticipation of his transplant we can certainly help with this.  Patient's blood sugars over 400 today and he was advised to follow-up with his endocrinologist.  Creatinine is slightly elevated today at 1.6 and he will receive a liter of IV fluid along with his chemotherapy today.  He was advised to call immediately if he has any concerning symptoms in the interval. The patient voices understanding of current disease status and treatment options and is in agreement with the current care plan.  All questions were answered. The patient knows to call the clinic with any problems, questions or concerns. We can certainly see the patient much sooner if necessary.   Mikey Bussing, DNP, AGPCNP-BC, AOCNP  ADDENDUM: Hematology/Oncology Attending: I had a face to face encounter with the patient today. I recommended his care plan. This is a very pleasant 50 years old white male with recurrent Hodgkin's lymphoma who is currently undergoing  treatment with bendamustine and Brentuximab status post 4 cycles. He is currently in the evaluation process for allogeneic stem cell transplant at [facility] close to his family. They recommended for the patient to proceed with one more cycle of treatment before the transplant. He is here today to start cycle #5 of his treatment with bendamustine and Brentuximab. We will proceed with the treatment today as scheduled. For the hyperglycemia, I strongly advised the patient to take his diabetic medication as ordered by his primary care physician and to reconsult with her regarding any adjustment  of his medication. I will keep his appointment open for now until he completes the stem cell transplant at [facility]. He was advised to call immediately if he has any concerning symptoms in the interval.  Disclaimer: This note was dictated with voice recognition software. Similar sounding words can inadvertently be transcribed and may be missed upon review.  Eilleen Kempf., MD 06/04/2015

## 2015-06-04 NOTE — Patient Instructions (Signed)
[place] Discharge Instructions for Patients Receiving Chemotherapy  Today you received the following chemotherapy agents; Brentuximab and Bendamustine.    To help prevent nausea and vomiting after your treatment, we encourage you to take your nausea medication as directed.    If you develop nausea and vomiting that is not controlled by your nausea medication, call the clinic.   BELOW ARE SYMPTOMS THAT SHOULD BE REPORTED IMMEDIATELY:  *FEVER GREATER THAN 100.5 F  *CHILLS WITH OR WITHOUT FEVER  NAUSEA AND VOMITING THAT IS NOT CONTROLLED WITH YOUR NAUSEA MEDICATION  *UNUSUAL SHORTNESS OF BREATH  *UNUSUAL BRUISING OR BLEEDING  TENDERNESS IN MOUTH AND THROAT WITH OR WITHOUT PRESENCE OF ULCERS  *URINARY PROBLEMS  *BOWEL PROBLEMS  UNUSUAL RASH Items with * indicate a potential emergency and should be followed up as soon as possible.  Feel free to call the clinic you have any questions or concerns. The clinic phone number is [phone].  Please show the [place] at check-in to the Emergency Department and triage nurse.

## 2015-06-04 NOTE — Progress Notes (Signed)
OK to tx with Creatinine of 1.6 per Dr. Julien Nordmann

## 2015-06-05 ENCOUNTER — Ambulatory Visit (HOSPITAL_BASED_OUTPATIENT_CLINIC_OR_DEPARTMENT_OTHER): Payer: Commercial Managed Care - PPO

## 2015-06-05 VITALS — BP 132/92 | HR 93 | Temp 98.7°F | Resp 18

## 2015-06-05 DIAGNOSIS — Z5112 Encounter for antineoplastic immunotherapy: Secondary | ICD-10-CM | POA: Diagnosis not present

## 2015-06-05 DIAGNOSIS — C8123 Mixed cellularity classical Hodgkin lymphoma, intra-abdominal lymph nodes: Secondary | ICD-10-CM

## 2015-06-05 DIAGNOSIS — C8122 Mixed cellularity classical Hodgkin lymphoma, intrathoracic lymph nodes: Secondary | ICD-10-CM

## 2015-06-05 MED ORDER — DEXAMETHASONE SODIUM PHOSPHATE 100 MG/10ML IJ SOLN
10.0000 mg | Freq: Once | INTRAMUSCULAR | Status: AC
  Administered 2015-06-05: 10 mg via INTRAVENOUS
  Filled 2015-06-05: qty 1

## 2015-06-05 MED ORDER — SODIUM CHLORIDE 0.9 % IV SOLN
90.0000 mg/m2 | Freq: Once | INTRAVENOUS | Status: AC
  Administered 2015-06-05: 200 mg via INTRAVENOUS
  Filled 2015-06-05: qty 8

## 2015-06-05 MED ORDER — SODIUM CHLORIDE 0.9% FLUSH
10.0000 mL | INTRAVENOUS | Status: AC | PRN
  Administered 2015-06-05: 10 mL
  Filled 2015-06-05: qty 10

## 2015-06-05 MED ORDER — HEPARIN SOD (PORK) LOCK FLUSH 100 UNIT/ML IV SOLN
500.0000 [IU] | INTRAVENOUS | Status: AC | PRN
  Administered 2015-06-05: 500 [IU]
  Filled 2015-06-05: qty 5

## 2015-06-05 NOTE — Patient Instructions (Signed)
[facility] Discharge Instructions for Patients Receiving Chemotherapy  Today you received the following chemotherapy agent: Bend[place].  To help prevent nausea and vomiting after your treatment, we encourage you to take your nausea medication as directed.  If you develop nausea and vomiting that is not controlled by your nausea medication, call the clinic.   BELOW ARE SYMPTOMS THAT SHOULD BE REPORTED IMMEDIATELY:  *FEVER GREATER THAN 100.5 F  *CHILLS WITH OR WITHOUT FEVER  NAUSEA AND VOMITING THAT IS NOT CONTROLLED WITH YOUR NAUSEA MEDICATION  *UNUSUAL SHORTNESS OF BREATH  *UNUSUAL BRUISING OR BLEEDING  TENDERNESS IN MOUTH AND THROAT WITH OR WITHOUT PRESENCE OF ULCERS  *URINARY PROBLEMS  *BOWEL PROBLEMS  UNUSUAL RASH Items with * indicate a potential emergency and should be followed up as soon as possible.  Feel free to call the clinic you have any questions or concerns. The clinic phone number is [phone].  Please show the [facility] at check-in to the Emergency Department and triage nurse.

## 2015-06-12 ENCOUNTER — Other Ambulatory Visit: Payer: Self-pay

## 2015-06-12 DIAGNOSIS — I10 Essential (primary) hypertension: Secondary | ICD-10-CM

## 2015-06-12 MED ORDER — FENOFIBRATE 145 MG PO TABS: 145.0000 mg | ORAL_TABLET | Freq: Every day | ORAL | Status: DC

## 2015-06-13 ENCOUNTER — Other Ambulatory Visit: Payer: Self-pay | Admitting: *Deleted

## 2015-06-29 ENCOUNTER — Telehealth: Payer: Self-pay | Admitting: Internal Medicine

## 2015-06-29 NOTE — Telephone Encounter (Signed)
s.w. pt and advised on 6.23 appt.Marland KitchenMarland KitchenMarland KitchenMarland Kitchenpt ok and aware

## 2015-07-06 ENCOUNTER — Telehealth: Payer: Self-pay | Admitting: Internal Medicine

## 2015-07-06 ENCOUNTER — Encounter: Payer: Self-pay | Admitting: Internal Medicine

## 2015-07-06 ENCOUNTER — Other Ambulatory Visit (HOSPITAL_BASED_OUTPATIENT_CLINIC_OR_DEPARTMENT_OTHER): Payer: Commercial Managed Care - PPO

## 2015-07-06 ENCOUNTER — Ambulatory Visit (HOSPITAL_BASED_OUTPATIENT_CLINIC_OR_DEPARTMENT_OTHER): Payer: Commercial Managed Care - PPO | Admitting: Internal Medicine

## 2015-07-06 VITALS — BP 132/89 | HR 92 | Temp 98.4°F | Resp 19 | Ht 68.0 in | Wt 207.0 lb

## 2015-07-06 DIAGNOSIS — C8120 Mixed cellularity classical Hodgkin lymphoma, unspecified site: Secondary | ICD-10-CM

## 2015-07-06 DIAGNOSIS — K0889 Other specified disorders of teeth and supporting structures: Secondary | ICD-10-CM

## 2015-07-06 DIAGNOSIS — C8122 Mixed cellularity classical Hodgkin lymphoma, intrathoracic lymph nodes: Secondary | ICD-10-CM

## 2015-07-06 DIAGNOSIS — C8118 Nodular sclerosis classical Hodgkin lymphoma, lymph nodes of multiple sites: Secondary | ICD-10-CM

## 2015-07-06 DIAGNOSIS — Z5111 Encounter for antineoplastic chemotherapy: Secondary | ICD-10-CM

## 2015-07-06 LAB — CBC WITH DIFFERENTIAL/PLATELET
BASO%: 1 % (ref 0.0–2.0)
Basophils Absolute: 0.1 10*3/uL (ref 0.0–0.1)
EOS%: 3.1 % (ref 0.0–7.0)
Eosinophils Absolute: 0.2 10*3/uL (ref 0.0–0.5)
HEMATOCRIT: 37.9 % — AB (ref 38.4–49.9)
HGB: 13.3 g/dL (ref 13.0–17.1)
LYMPH#: 0.5 10*3/uL — AB (ref 0.9–3.3)
LYMPH%: 8.5 % — ABNORMAL LOW (ref 14.0–49.0)
MCH: 33 pg (ref 27.2–33.4)
MCHC: 35.1 g/dL (ref 32.0–36.0)
MCV: 94 fL (ref 79.3–98.0)
MONO#: 0.5 10*3/uL (ref 0.1–0.9)
MONO%: 9.2 % (ref 0.0–14.0)
NEUT#: 4.5 10*3/uL (ref 1.5–6.5)
NEUT%: 78.2 % — AB (ref 39.0–75.0)
PLATELETS: 186 10*3/uL (ref 140–400)
RBC: 4.03 10*6/uL — AB (ref 4.20–5.82)
RDW: 14.3 % (ref 11.0–14.6)
WBC: 5.7 10*3/uL (ref 4.0–10.3)

## 2015-07-06 LAB — COMPREHENSIVE METABOLIC PANEL
ALT: 44 U/L (ref 0–55)
ANION GAP: 10 meq/L (ref 3–11)
AST: 41 U/L — ABNORMAL HIGH (ref 5–34)
Albumin: 4.1 g/dL (ref 3.5–5.0)
Alkaline Phosphatase: 75 U/L (ref 40–150)
BUN: 18.2 mg/dL (ref 7.0–26.0)
CALCIUM: 10.2 mg/dL (ref 8.4–10.4)
CHLORIDE: 104 meq/L (ref 98–109)
CO2: 25 mEq/L (ref 22–29)
CREATININE: 1.3 mg/dL (ref 0.7–1.3)
EGFR: 62 mL/min/{1.73_m2} — AB (ref >90)
Glucose: 144 mg/dl — ABNORMAL HIGH (ref 70–140)
Potassium: 4.5 mEq/L (ref 3.5–5.1)
Sodium: 139 mEq/L (ref 136–145)
Total Bilirubin: 0.46 mg/dL (ref 0.20–1.20)
Total Protein: 8.3 g/dL (ref 6.4–8.3)

## 2015-07-06 NOTE — Telephone Encounter (Signed)
Gave and printed appt sched and avs for pt for June °

## 2015-07-06 NOTE — Progress Notes (Signed)
[place] [phone]   [phone]  OFFICE PROGRESS NOTE  Pcp Not In System No address on file  DIAGNOSIS: Recurrent Hodgkin's lymphoma initially diagnosed as Stage III classical Hodgkin's lymphoma, mixed cellularity subtype diagnosed in December 2014.  PRIOR THERAPY:  1) Status post 6 cycles of systemic chemotherapy with ABVD complicated with pulmonary fibrosis, last dose was given 07/14/2013. 2) Salvage chemotherapy with ICE (ifosfamide/mesna, carboplatin and etoposide) first dose was given 03/06/2014. Status post 4 cycles. Last cycle was given in May 2016. 3) chemotherapy embolization for stem cell transplant with etoposide completed on 06/23/2014. 4) autologous peripheral blood stem cell transplant on 07/18/2014. 5) CAR-T cell infusion on 08/04/2014 on a clinical trial at [facility]. 6) the patient has evidence for disease progression on the recent CT scan of the chest on 01/31/2015 which showed evidence for new mediastinal lymphadenopathy and follow-up PET scan on general 25th 2017 showed hypermetabolic activity of the new lymphadenopathy questionable for disease recurrence.  CURRENT THERAPY: Systemic chemotherapy with bendamustine 90 MG/M2 on days 1 and 2 and Brentuximab 1.8 mg/kg on day 1 every 3 weeks in preparation for allogenic stem cell transplant. First cycle on 02/21/2015. Status post 5 cycles.  INTERVAL HISTORY: [name] [name] 50 y.o. male returns to the clinic today for follow-up visit. The patient is feeling fine today with no specific complaints. He tolerated the last cycle of his treatment with bendamustine and Brentuximab fairly well with no significant adverse effects. He was evaluated at [facility] for the allogeneic stem cell transplant and expected to have the procedure on 08/10/2015. There are recommended for the patient to have 1 more cycles of treatment with bendamustine and Brentuximab. He denied having any  significant nausea or vomiting. He has no fever or chills. He denied having any significant weight loss or night sweats. The patient has no significant chest pain, shortness of breath, cough or hemoptysis. He is in today for evaluation before starting cycle #6 of his treatment.   MEDICAL HISTORY: Past Medical History  Diagnosis Date  . Hypertension   . Diabetes mellitus without complication (HCC)     Pre-diabetes  . Depression   . Headache(784.0)   . Thyroid disease     Hx: of  . Cancer ([place]) 2015    cervical lymphadenopathy  . Hodgkin lymphoma ([place]) 2015  . Hepatic artery aneurysm ([place]) 2015  . Pain, dental 02/12/2015  . Encounter for antineoplastic chemotherapy 03/20/2015    ALLERGIES:  has No Known Allergies.  MEDICATIONS:  Current Outpatient Prescriptions  Medication Sig Dispense Refill  . aspirin EC 81 MG tablet Take 1 tablet (81 mg total) by mouth daily.    . Azelastine-Fluticasone 137-50 MCG/ACT SUSP Place 1 spray into the nose 2 (two) times daily.    . Cholecalciferol (D 1000) 1000 units capsule Take 1,000 mg by mouth daily.    . eszopiclone (LUNESTA) 2 MG TABS tablet Take 2 mg by mouth at bedtime as needed for sleep. Reported on 04/05/2015    . fenofibrate (TRICOR) 145 MG tablet Take 1 tablet (145 mg total) by mouth daily. 30 tablet 8  . insulin glargine (LANTUS) 100 UNIT/ML injection Inject 15 Units into the skin at bedtime.    . insulin lispro (HUMALOG) 100 UNIT/ML injection Inject into the skin 2 times daily at 12 noon and 4 pm. Sliding scale for dose    . levothyroxine (SYNTHROID, LEVOTHROID) 200 MCG tablet Take 200 mcg by mouth  daily before breakfast.    . lidocaine-prilocaine (EMLA) cream Apply 1 application topically as needed. 30 g 2  . lovastatin (ALTOPREV) 40 MG 24 hr tablet Take 40 mg by mouth at bedtime.    Marland Kitchen omega-3 acid ethyl esters (LOVAZA) 1 g capsule Take 2 capsules (2 g total) by mouth 2 (two) times daily. 120 capsule 11  . oxyCODONE (OXY IR/ROXICODONE) 5 MG  immediate release tablet Take 1 tablet (5 mg total) by mouth every 6 (six) hours as needed for severe pain. 60 tablet 0  . prochlorperazine (COMPAZINE) 10 MG tablet Take 1 tablet (10 mg total) by mouth every 6 (six) hours as needed for nausea or vomiting. 30 tablet 1  . sertraline (ZOLOFT) 100 MG tablet Take 100 mg by mouth daily with breakfast.     . sodium fluoride (PREVIDENT 5000 PLUS) 1.1 % CREA dental cream Apply fluoride to tooth brush. Brush teeth for 2 minutes. Spit out excess-DO NOT swallow. Repeat nightly. 1 Tube prn  . valACYclovir (VALTREX) 500 MG tablet Take 500 mg by mouth daily.     . metFORMIN (GLUCOPHAGE) 500 MG tablet Take 500 mg by mouth 2 (two) times daily. Reported on 07/06/2015    . ondansetron (ZOFRAN) 8 MG tablet Every 12 hours as needed for nausea/vomiting.  Begin on day 3 after chemo. (Patient not taking: Reported on 07/06/2015) 30 tablet 1   No current facility-administered medications for this visit.    SURGICAL HISTORY:  Past Surgical History  Procedure Laterality Date  . Tonsillectomy    . Mass excision Left 12/31/2012    Procedure: CERVICAL LYMPH NODE BIOPSY;  Surgeon: [name], MD;  Location: [place];  Service: General;  Laterality: Left;    REVIEW OF SYSTEMS:  Constitutional: negative Eyes: negative Ears, nose, mouth, throat, and face: negative Respiratory: negative Cardiovascular: negative Gastrointestinal: negative Genitourinary:negative Integument/breast: negative Hematologic/lymphatic: negative Musculoskeletal:negative Neurological: negative Behavioral/Psych: negative Endocrine: negative Allergic/Immunologic: negative   PHYSICAL EXAMINATION: General appearance: alert, cooperative and no distress Head: Normocephalic, without obvious abnormality, atraumatic Neck: no JVD, supple, symmetrical, trachea midline, thyroid not enlarged, symmetric, no tenderness/mass/nodules and Palpable small left lower cervical lymph node, firm to palpation Lymph  nodes: Palpable small left lower cervical lymph node, firm to palpation Resp: clear to auscultation bilaterally Back: symmetric, no curvature. ROM normal. No CVA tenderness. Cardio: regular rate and rhythm, S1, S2 normal, no murmur, click, rub or gallop GI: soft, non-tender; bowel sounds normal; no masses,  no organomegaly Extremities: extremities normal, atraumatic, no cyanosis or edema Neurologic: Alert and oriented X 3, normal strength and tone. Normal symmetric reflexes. Normal coordination and gait  ECOG PERFORMANCE STATUS: 0 - Asymptomatic  Blood pressure 132/89, pulse 92, temperature 98.4 F (36.9 C), temperature source Oral, resp. rate 19, height 5\' 8"  (1.727 m), weight 207 lb (93.895 kg), SpO2 98 %.  LABORATORY DATA: Lab Results  Component Value Date   WBC 5.7 07/06/2015   HGB 13.3 07/06/2015   HCT 37.9* 07/06/2015   MCV 94.0 07/06/2015   PLT 186 07/06/2015      Chemistry      Component Value Date/Time   NA 139 07/06/2015 1034   NA 133* 05/15/2014 1043   K 4.5 07/06/2015 1034   K 4.5 05/15/2014 1043   CL 97* 05/15/2014 1043   CO2 25 07/06/2015 1034   CO2 26 05/15/2014 1043   BUN 18.2 07/06/2015 1034   BUN 21* 05/15/2014 1043   CREATININE 1.3 07/06/2015 1034   CREATININE 1.34* 05/15/2014  1043      Component Value Date/Time   CALCIUM 10.2 07/06/2015 1034   CALCIUM 9.5 05/15/2014 1043   ALKPHOS 75 07/06/2015 1034   ALKPHOS 108 04/26/2015 0838   AST 41* 07/06/2015 1034   AST 60* 04/26/2015 0838   ALT 44 07/06/2015 1034   ALT 78* 04/26/2015 0838   BILITOT 0.46 07/06/2015 1034   BILITOT 0.6 04/26/2015 0838       RADIOGRAPHIC STUDIES: No results found. ASSESSMENT AND PLAN: This is a very pleasant 50 years old white male with history of stage III Hodgkin's lymphoma status post 6 cycles of systemic chemotherapy with ABVD with almost complete response. He is currently undergoing salvage chemotherapy with ICE status post 4 cycles and tolerated the first cycle  fairly well. This was followed by peripheral blood stem cell transplant as well as CAR T-cell infusion. Unfortunately recent imaging studies showed evidence for disease progression and the patient is considered for allogeneic stem cell transplant.This is expected to be performed at [facility] close to his family on 08/10/2015. It was recommended by the stem cell transplant team to start the patient on treatment with bendamustine and Brentuximab. He is status post 5 cycles of this treatment. He is here for evaluation before starting cycle #6 before the transplant. He is expected to start this treatment on 07/10/2015. The patient will have weekly lab for the next 3 weeks. I will see him back for follow-up visit after the transplant. He was advised to call immediately if he has any concerning symptoms in the interval. The patient voices understanding of current disease status and treatment options and is in agreement with the current care plan.  All questions were answered. The patient knows to call the clinic with any problems, questions or concerns. We can certainly see the patient much sooner if necessary.  Disclaimer: This note was dictated with voice recognition software. Similar sounding words can inadvertently be transcribed and may not be corrected upon review.

## 2015-07-12 ENCOUNTER — Ambulatory Visit (HOSPITAL_BASED_OUTPATIENT_CLINIC_OR_DEPARTMENT_OTHER): Payer: Commercial Managed Care - PPO

## 2015-07-12 ENCOUNTER — Other Ambulatory Visit (HOSPITAL_BASED_OUTPATIENT_CLINIC_OR_DEPARTMENT_OTHER): Payer: Commercial Managed Care - PPO

## 2015-07-12 VITALS — BP 115/94 | HR 99 | Temp 98.8°F | Resp 20

## 2015-07-12 DIAGNOSIS — C8122 Mixed cellularity classical Hodgkin lymphoma, intrathoracic lymph nodes: Secondary | ICD-10-CM

## 2015-07-12 DIAGNOSIS — Z5112 Encounter for antineoplastic immunotherapy: Secondary | ICD-10-CM | POA: Diagnosis not present

## 2015-07-12 DIAGNOSIS — Z5111 Encounter for antineoplastic chemotherapy: Secondary | ICD-10-CM | POA: Diagnosis not present

## 2015-07-12 DIAGNOSIS — C8123 Mixed cellularity classical Hodgkin lymphoma, intra-abdominal lymph nodes: Secondary | ICD-10-CM

## 2015-07-12 DIAGNOSIS — C8118 Nodular sclerosis classical Hodgkin lymphoma, lymph nodes of multiple sites: Secondary | ICD-10-CM

## 2015-07-12 LAB — CBC WITH DIFFERENTIAL/PLATELET
BASO%: 1.1 % (ref 0.0–2.0)
Basophils Absolute: 0.1 10*3/uL (ref 0.0–0.1)
EOS ABS: 0.2 10*3/uL (ref 0.0–0.5)
EOS%: 3.7 % (ref 0.0–7.0)
HCT: 37.1 % — ABNORMAL LOW (ref 38.4–49.9)
HGB: 12.9 g/dL — ABNORMAL LOW (ref 13.0–17.1)
LYMPH%: 8.5 % — AB (ref 14.0–49.0)
MCH: 33.4 pg (ref 27.2–33.4)
MCHC: 34.9 g/dL (ref 32.0–36.0)
MCV: 95.8 fL (ref 79.3–98.0)
MONO#: 0.6 10*3/uL (ref 0.1–0.9)
MONO%: 11 % (ref 0.0–14.0)
NEUT#: 4.1 10*3/uL (ref 1.5–6.5)
NEUT%: 75.7 % — AB (ref 39.0–75.0)
PLATELETS: 235 10*3/uL (ref 140–400)
RBC: 3.87 10*6/uL — AB (ref 4.20–5.82)
RDW: 15.2 % — ABNORMAL HIGH (ref 11.0–14.6)
WBC: 5.4 10*3/uL (ref 4.0–10.3)
lymph#: 0.5 10*3/uL — ABNORMAL LOW (ref 0.9–3.3)

## 2015-07-12 LAB — COMPREHENSIVE METABOLIC PANEL
ALT: 37 U/L (ref 0–55)
ANION GAP: 9 meq/L (ref 3–11)
AST: 33 U/L (ref 5–34)
Albumin: 4.1 g/dL (ref 3.5–5.0)
Alkaline Phosphatase: 80 U/L (ref 40–150)
BUN: 15.1 mg/dL (ref 7.0–26.0)
CHLORIDE: 101 meq/L (ref 98–109)
CO2: 27 meq/L (ref 22–29)
CREATININE: 1.5 mg/dL — AB (ref 0.7–1.3)
Calcium: 10.4 mg/dL (ref 8.4–10.4)
EGFR: 56 mL/min/{1.73_m2} — AB (ref >90)
Glucose: 139 mg/dl (ref 70–140)
POTASSIUM: 4.1 meq/L (ref 3.5–5.1)
Sodium: 137 mEq/L (ref 136–145)
Total Bilirubin: 0.4 mg/dL (ref 0.20–1.20)
Total Protein: 8.2 g/dL (ref 6.4–8.3)

## 2015-07-12 MED ORDER — HEPARIN SOD (PORK) LOCK FLUSH 100 UNIT/ML IV SOLN
500.0000 [IU] | Freq: Once | INTRAVENOUS | Status: AC | PRN
  Administered 2015-07-12: 500 [IU]
  Filled 2015-07-12: qty 5

## 2015-07-12 MED ORDER — SODIUM CHLORIDE 0.9 % IV SOLN
Freq: Once | INTRAVENOUS | Status: AC
  Administered 2015-07-12: 15:00:00 via INTRAVENOUS

## 2015-07-12 MED ORDER — SODIUM CHLORIDE 0.9% FLUSH
10.0000 mL | INTRAVENOUS | Status: DC | PRN
  Administered 2015-07-12: 10 mL
  Filled 2015-07-12: qty 10

## 2015-07-12 MED ORDER — SODIUM CHLORIDE 0.9 % IV SOLN
1.8000 mg/kg | Freq: Once | INTRAVENOUS | Status: AC
  Administered 2015-07-12: 180 mg via INTRAVENOUS
  Filled 2015-07-12: qty 36

## 2015-07-12 MED ORDER — ACETAMINOPHEN 325 MG PO TABS
650.0000 mg | ORAL_TABLET | Freq: Once | ORAL | Status: AC
  Administered 2015-07-12: 650 mg via ORAL

## 2015-07-12 MED ORDER — DIPHENHYDRAMINE HCL 50 MG/ML IJ SOLN
INTRAMUSCULAR | Status: AC
  Filled 2015-07-12: qty 1

## 2015-07-12 MED ORDER — SODIUM CHLORIDE 0.9 % IV SOLN
90.0000 mg/m2 | Freq: Once | INTRAVENOUS | Status: AC
  Administered 2015-07-12: 200 mg via INTRAVENOUS
  Filled 2015-07-12: qty 8

## 2015-07-12 MED ORDER — PALONOSETRON HCL INJECTION 0.25 MG/5ML
INTRAVENOUS | Status: AC
  Filled 2015-07-12: qty 5

## 2015-07-12 MED ORDER — ACETAMINOPHEN 325 MG PO TABS
ORAL_TABLET | ORAL | Status: AC
  Filled 2015-07-12: qty 2

## 2015-07-12 MED ORDER — DIPHENHYDRAMINE HCL 50 MG/ML IJ SOLN
50.0000 mg | Freq: Once | INTRAMUSCULAR | Status: AC
  Administered 2015-07-12: 50 mg via INTRAVENOUS

## 2015-07-12 MED ORDER — SODIUM CHLORIDE 0.9 % IV SOLN
10.0000 mg | Freq: Once | INTRAVENOUS | Status: AC
  Administered 2015-07-12: 10 mg via INTRAVENOUS
  Filled 2015-07-12: qty 1

## 2015-07-12 MED ORDER — PALONOSETRON HCL INJECTION 0.25 MG/5ML
0.2500 mg | Freq: Once | INTRAVENOUS | Status: AC
  Administered 2015-07-12: 0.25 mg via INTRAVENOUS

## 2015-07-12 NOTE — Patient Instructions (Signed)
[facility] Discharge Instructions for Patients Receiving Chemotherapy  Today you received the following chemotherapy agents: Brentuximab and Bendeka.  To help prevent nausea and vomiting after your treatment, we encourage you to take your nausea medication as directed.   If you develop nausea and vomiting that is not controlled by your nausea medication, call the clinic.   BELOW ARE SYMPTOMS THAT SHOULD BE REPORTED IMMEDIATELY:  *FEVER GREATER THAN 100.5 F  *CHILLS WITH OR WITHOUT FEVER  NAUSEA AND VOMITING THAT IS NOT CONTROLLED WITH YOUR NAUSEA MEDICATION  *UNUSUAL SHORTNESS OF BREATH  *UNUSUAL BRUISING OR BLEEDING  TENDERNESS IN MOUTH AND THROAT WITH OR WITHOUT PRESENCE OF ULCERS  *URINARY PROBLEMS  *BOWEL PROBLEMS  UNUSUAL RASH Items with * indicate a potential emergency and should be followed up as soon as possible.  Feel free to call the clinic you have any questions or concerns. The clinic phone number is [phone].  Please show the [place] at check-in to the Emergency Department and triage nurse.

## 2015-07-13 ENCOUNTER — Ambulatory Visit (HOSPITAL_BASED_OUTPATIENT_CLINIC_OR_DEPARTMENT_OTHER): Payer: Commercial Managed Care - PPO

## 2015-07-13 VITALS — BP 122/82 | HR 88 | Temp 97.9°F | Resp 18

## 2015-07-13 DIAGNOSIS — Z5111 Encounter for antineoplastic chemotherapy: Secondary | ICD-10-CM | POA: Diagnosis not present

## 2015-07-13 DIAGNOSIS — C8123 Mixed cellularity classical Hodgkin lymphoma, intra-abdominal lymph nodes: Secondary | ICD-10-CM

## 2015-07-13 DIAGNOSIS — C8122 Mixed cellularity classical Hodgkin lymphoma, intrathoracic lymph nodes: Secondary | ICD-10-CM | POA: Diagnosis not present

## 2015-07-13 MED ORDER — SODIUM CHLORIDE 0.9% FLUSH
10.0000 mL | INTRAVENOUS | Status: DC | PRN
  Administered 2015-07-13: 10 mL via INTRAVENOUS
  Filled 2015-07-13: qty 10

## 2015-07-13 MED ORDER — SODIUM CHLORIDE 0.9 % IV SOLN
INTRAVENOUS | Status: DC
  Administered 2015-07-13: 08:00:00 via INTRAVENOUS

## 2015-07-13 MED ORDER — DEXAMETHASONE SODIUM PHOSPHATE 100 MG/10ML IJ SOLN
10.0000 mg | Freq: Once | INTRAMUSCULAR | Status: AC
  Administered 2015-07-13: 10 mg via INTRAVENOUS
  Filled 2015-07-13: qty 1

## 2015-07-13 MED ORDER — HEPARIN SOD (PORK) LOCK FLUSH 100 UNIT/ML IV SOLN
500.0000 [IU] | Freq: Once | INTRAVENOUS | Status: AC
  Administered 2015-07-13: 500 [IU] via INTRAVENOUS
  Filled 2015-07-13: qty 5

## 2015-07-13 MED ORDER — SODIUM CHLORIDE 0.9 % IV SOLN
90.0000 mg/m2 | Freq: Once | INTRAVENOUS | Status: AC
  Administered 2015-07-13: 200 mg via INTRAVENOUS
  Filled 2015-07-13: qty 8

## 2015-07-13 NOTE — Patient Instructions (Signed)
[place] [facility] Discharge Instructions for Patients Receiving Chemotherapy  Today you received the following chemotherapy agent: Bendeka.  To help prevent nausea and vomiting after your treatment, we encourage you to take your nausea medication as directed.  If you develop nausea and vomiting that is not controlled by your nausea medication, call the clinic.   BELOW ARE SYMPTOMS THAT SHOULD BE REPORTED IMMEDIATELY:  *FEVER GREATER THAN 100.5 F  *CHILLS WITH OR WITHOUT FEVER  NAUSEA AND VOMITING THAT IS NOT CONTROLLED WITH YOUR NAUSEA MEDICATION  *UNUSUAL SHORTNESS OF BREATH  *UNUSUAL BRUISING OR BLEEDING  TENDERNESS IN MOUTH AND THROAT WITH OR WITHOUT PRESENCE OF ULCERS  *URINARY PROBLEMS  *BOWEL PROBLEMS  UNUSUAL RASH Items with * indicate a potential emergency and should be followed up as soon as possible.  Feel free to call the clinic you have any questions or concerns. The clinic phone number is [phone].  Please show the [place] at check-in to the Emergency Department and triage nurse.

## 2015-08-08 IMAGING — CT NM PET TUM IMG RESTAG (PS) SKULL BASE T - THIGH
8 series · 25 of 25 positions shown · non-contrast
Comparison: PET of 01/26/2014. Neck chest abdomen and pelvic CTs of
04/20/2014.

CLINICAL DATA: Subsequent treatment strategy for restaging of
Hodgkin's lymphoma. Recurrent. History of hepatic artery aneurysm..

EXAM:
NUCLEAR MEDICINE PET SKULL BASE TO THIGH
TECHNIQUE: 10.2 mCi F-18 FDG was injected intravenously. Full-ring PET imaging
was performed from the skull base to thigh after the radiotracer. CT
data was obtained and used for attenuation correction and anatomic
localization.
FASTING BLOOD GLUCOSE:  Value: 206 mg/dl

[Series 3: pet sk_thigh ac · axial · 5.0mm · 4.07mm/px · z∈[-529,+435]mm · 5 of 242 slices shown]
[im 1/242]
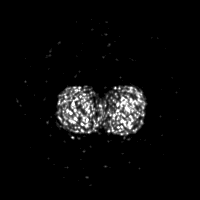
[im 61/242]
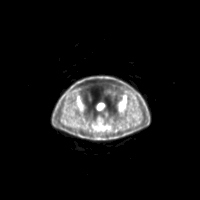
[im 121/242]
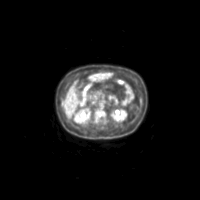
[im 181/242]
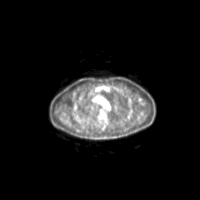
[im 242/242]
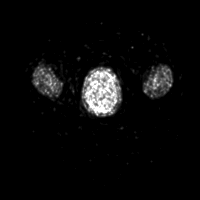

[Series 4: ct sk_thigh 5.0 b31f · axial · 5.0mm · 0.98mm/px · z∈[-529,+435]mm · 5 of 242 slices shown]
[im 1/242]
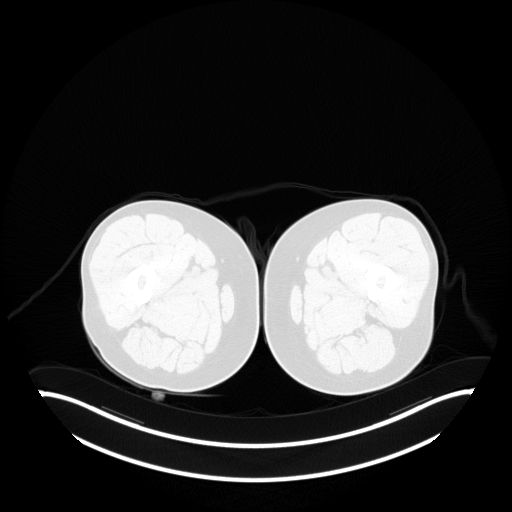
[im 61/242]
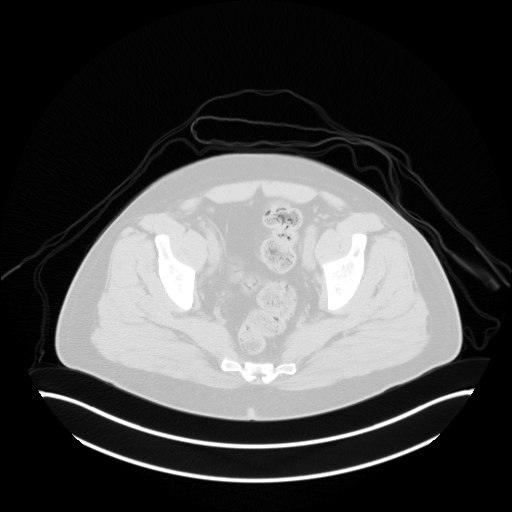
[im 121/242]
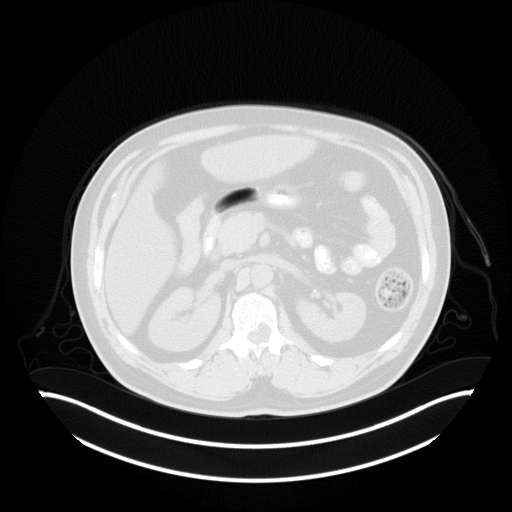
[im 181/242]
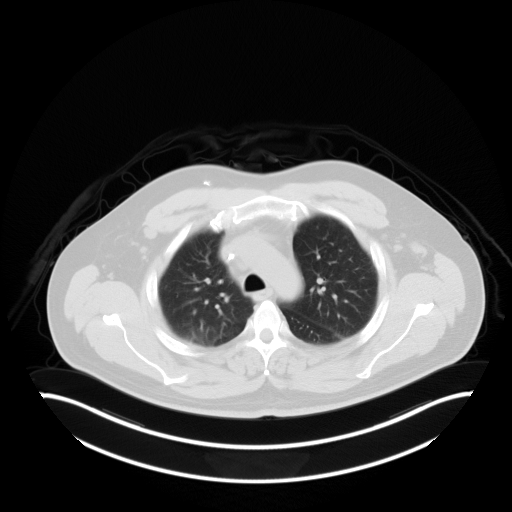
[im 242/242  brain]
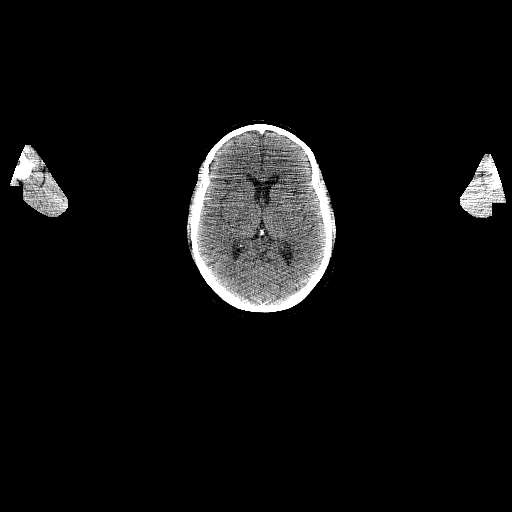

[Series 7: pet sk_thigh nac · axial · 5.0mm · 4.07mm/px · z∈[-529,+435]mm · 5 of 242 slices shown]
[im 1/242]
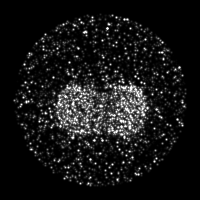
[im 61/242]
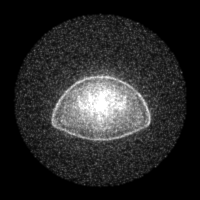
[im 121/242]
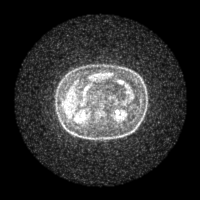
[im 181/242]
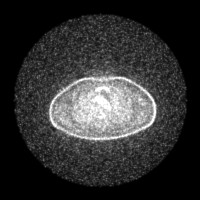
[im 242/242]
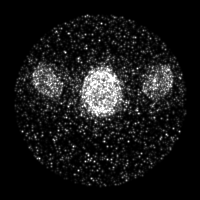

[Series 8: ct sk_thigh 5.0 b70f (id)_bone · axial · 5.0mm · 0.66mm/px · 1 of 53 slices shown]
[im 1/53  bone]
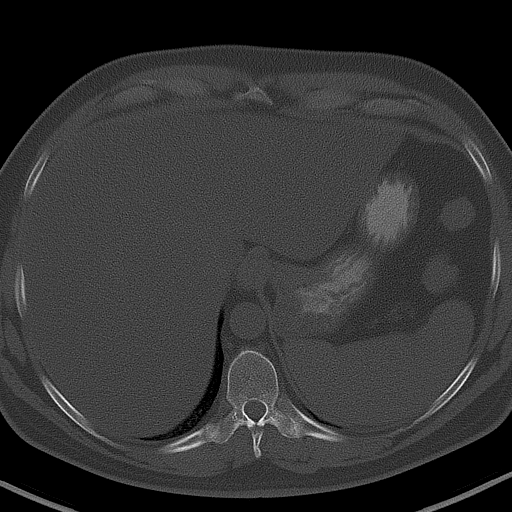

[Series 603: mip collection<mip range> · coronal · 2.00mm/px · 1 of 32 slices shown]
[im 1/32]
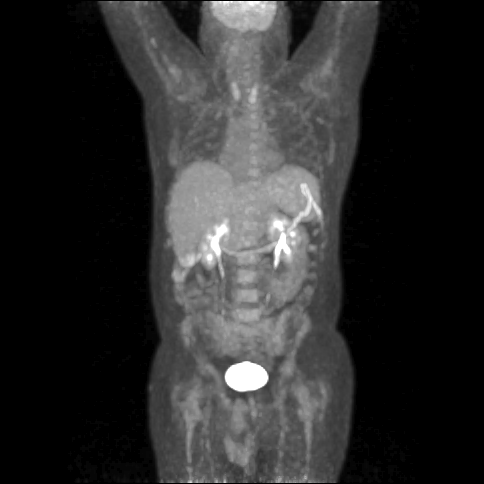

[Series 604: range-ct sk_thigh 5.0 (id)<alpha range> · 2 of 82 slices shown (1 of 2)]
[im 1/82]
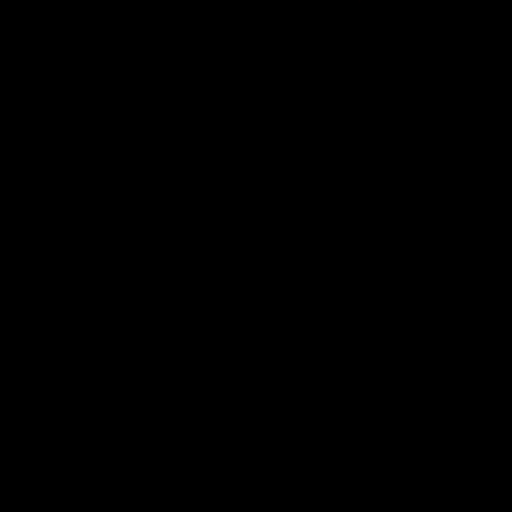
[im 82/82]
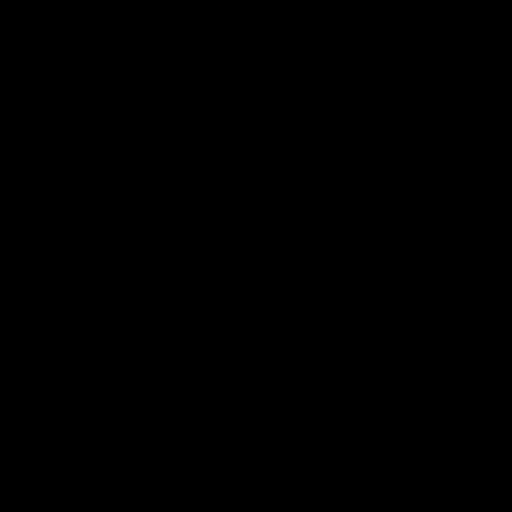

[Series 605: range-ct sk_thigh 5.0 (id)<alpha range> · 5 of 218 slices shown (2 of 2)]
[im 1/218]
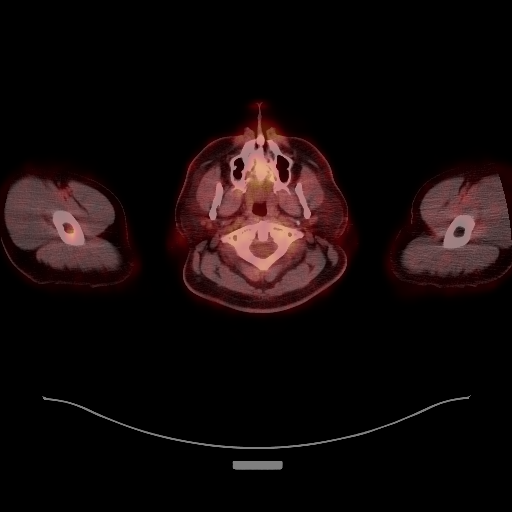
[im 55/218]
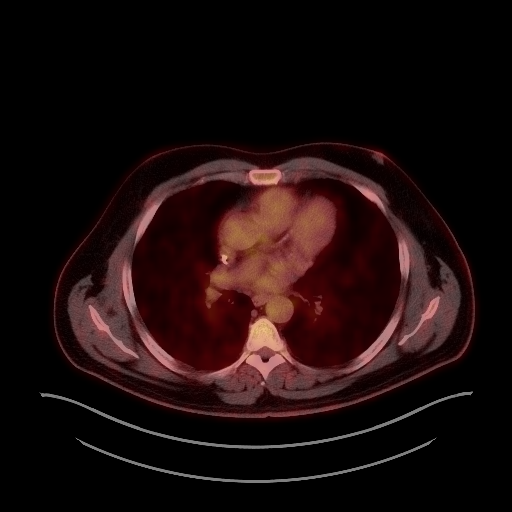
[im 109/218]
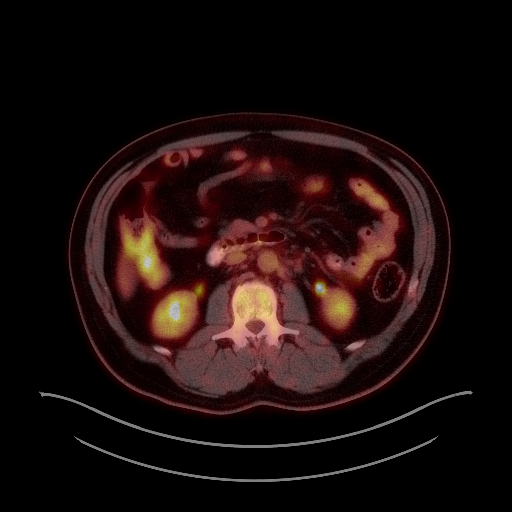
[im 163/218]
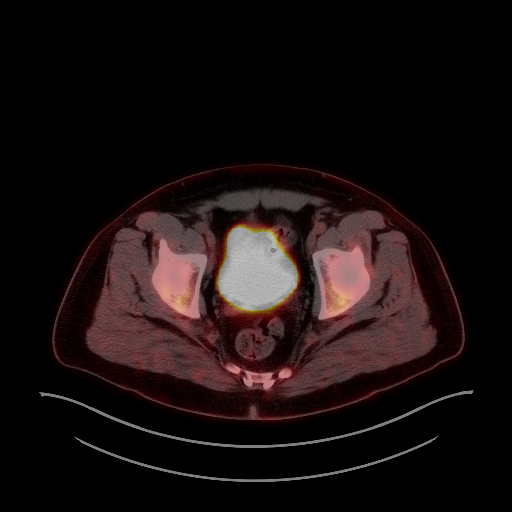
[im 218/218]
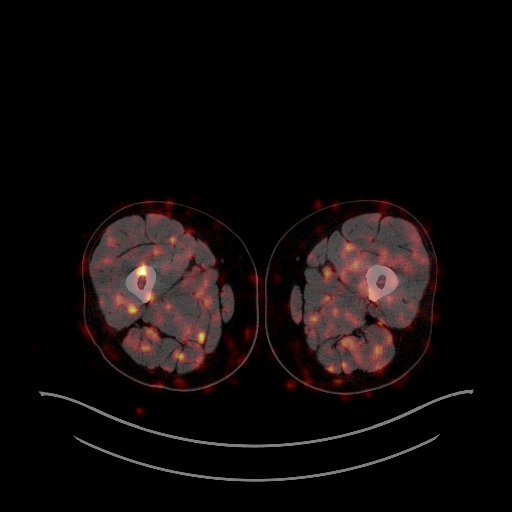

[Series 1366: results mm oncology reading · 1.20mm/px · 1 of 4 slices shown]
[im 1/4]
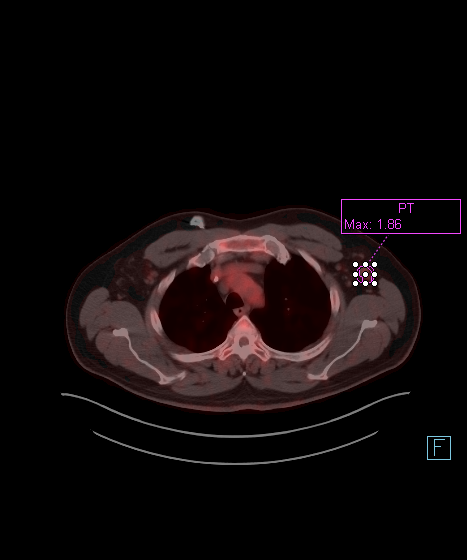

[25 of 25 positions shown; findings below may reference images not displayed]

FINDINGS: NECK

Resolution of previously described cervical nodal hypermetabolism.

CHEST

A left axillary node which measures 1.2 cm and a S.U.V. max of
on image 61. ([HOSPITAL] 1). This node measured 1.5 cm the prior exam
of 04/20/2014. On the prior PET, this measured a S.U.V. max of 8.9.
(when remeasured).

ABDOMEN/PELVIS

An index left periaortic node measures 1.5 cm and a S.U.V. max of
1.8 on image 150. ([HOSPITAL] 1). This is decreased in size from
cm on 04/20/2014 CT. Measured a S.U.V. max of 9.6 on the prior PET
(when remeasured).

SKELETON

Mildly increased marrow hypermetabolism throughout is likely due to
stimulation by chemotherapy.

CT IMAGES PERFORMED FOR ATTENUATION CORRECTION

Index left jugular node measures 1.0 cm on image 43 versus 1.1 cm on
the prior exam (when remeasured). A right-sided Port-A-Cath
terminates at the high right atrium. 1.2 cm right axillary node on
image 64 is decreased from 1.3 cm on 04/20/2014.

Multivessel coronary artery atherosclerosis.

Cystic structure in the porta hepatis is similar and secondary to
prior hepatic artery correlating. Moderate hepatic steatosis.
Cholecystectomy.
IMPRESSION: 1. Response to therapy of hypermetabolic adenopathy since 04/20/2014
CT and 01/26/2014 PET. No residual malignant range activity
identified.
2. No new sites of disease.
3. Hepatic steatosis.
4. Age advanced coronary artery atherosclerosis. Recommend
assessment of coronary risk factors and consideration of medical
therapy.

## 2015-08-22 ENCOUNTER — Other Ambulatory Visit: Payer: Self-pay | Admitting: Nurse Practitioner

## 2015-08-23 ENCOUNTER — Other Ambulatory Visit: Payer: Self-pay | Admitting: Nurse Practitioner

## 2015-12-13 ENCOUNTER — Other Ambulatory Visit (HOSPITAL_COMMUNITY): Payer: Self-pay | Admitting: Interventional Radiology

## 2015-12-13 DIAGNOSIS — I728 Aneurysm of other specified arteries: Secondary | ICD-10-CM

## 2016-01-01 ENCOUNTER — Ambulatory Visit (HOSPITAL_COMMUNITY)
Admission: RE | Admit: 2016-01-01 | Discharge: 2016-01-01 | Disposition: A | Discharge status: Home / Self Care | Payer: Commercial Managed Care - PPO | Source: Ambulatory Visit | Attending: Interventional Radiology | Admitting: Interventional Radiology

## 2016-01-01 ENCOUNTER — Ambulatory Visit
Admission: RE | Admit: 2016-01-01 | Discharge: 2016-01-01 | Disposition: A | Discharge status: Home / Self Care | Payer: Commercial Managed Care - PPO | Source: Ambulatory Visit | Attending: Interventional Radiology | Admitting: Interventional Radiology

## 2016-01-01 DIAGNOSIS — I700 Atherosclerosis of aorta: Secondary | ICD-10-CM | POA: Diagnosis not present

## 2016-01-01 DIAGNOSIS — I728 Aneurysm of other specified arteries: Secondary | ICD-10-CM

## 2016-01-01 DIAGNOSIS — R918 Other nonspecific abnormal finding of lung field: Secondary | ICD-10-CM | POA: Diagnosis not present

## 2016-01-01 HISTORY — PX: IR GENERIC HISTORICAL: IMG1180011

## 2016-01-01 NOTE — Progress Notes (Signed)
Chief Complaint: Patient was seen in follow-up today for left apart scratch that left hepatic artery aneurysm status post coil embolization at the request of [facility]  Referring Physician(s): Paitlyn Mcclatchey  History of Present Illness: [name] [name] is a 50 y.o. 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 This was discovered incidentally on imaging workup for lymphoma. Patient underwent endovascular embolization using a combination of coils and liquid embolic in January [id number].  He is currently doing well and currently in remission regarding his lymphoma. He denies abdominal pain or other active clinical symptoms. He is back to working nearly full-time. I asked him specifically if anyone at any of the other hospitals he has undergone imaging and follow-up at over the last year have made comment about negative changes in his hepatic artery aneurysm. He assures me they have not, only commented that it is stable.  Past Medical History:  Diagnosis Date  . Cancer ([place]) 2015   cervical lymphadenopathy  . Depression   . Diabetes mellitus without complication (HCC)    Pre-diabetes  . Encounter for antineoplastic chemotherapy 03/20/2015  . Headache(784.0)   . Hepatic artery aneurysm ([place]) 2015  . Hodgkin lymphoma ([place]) 2015  . Hypertension   . Pain, dental 02/12/2015  . Thyroid disease    Hx: of    Past Surgical History:  Procedure Laterality Date  . MASS EXCISION Left 12/31/2012   Procedure: CERVICAL LYMPH NODE BIOPSY;  Surgeon: [name], MD;  Location: [facility];  Service: General;  Laterality: Left;  . TONSILLECTOMY      Allergies: Patient has no known allergies.  Medications: Prior to Admission medications   Medication Sig Start Date End Date Taking? Authorizing Provider  Azelastine-Fluticasone 137-50 MCG/ACT SUSP Place 1 spray into the nose 2 (two) times daily.   Yes Historical Provider, MD    Cholecalciferol (D 1000) 1000 units capsule Take 1,000 mg by mouth daily.   Yes Historical Provider, MD  eszopiclone (LUNESTA) 2 MG TABS tablet Take 2 mg by mouth at bedtime as needed for sleep. Reported on 04/05/2015   Yes Historical Provider, MD  fenofibrate (TRICOR) 145 MG tablet Take 1 tablet (145 mg total) by mouth daily. 06/12/15  Yes Larey Dresser, MD  insulin glargine (LANTUS) 100 UNIT/ML injection Inject 15 Units into the skin at bedtime.   Yes Historical Provider, MD  insulin lispro (HUMALOG) 100 UNIT/ML injection Inject into the skin 2 times daily at 12 noon and 4 pm. Sliding scale for dose   Yes Historical Provider, MD  levothyroxine (SYNTHROID, LEVOTHROID) 200 MCG tablet Take 200 mcg by mouth daily before breakfast.   Yes Historical Provider, MD  lidocaine-prilocaine (EMLA) cream Apply 1 application topically as needed. 06/04/15  Yes [name], NP  lovastatin (ALTOPREV) 40 MG 24 hr tablet Take 40 mg by mouth at bedtime.   Yes Historical Provider, MD  omega-3 acid ethyl esters (LOVAZA) 1 g capsule Take 2 capsules (2 g total) by mouth 2 (two) times daily. 02/28/15  Yes Larey Dresser, MD  oxyCODONE (OXY IR/ROXICODONE) 5 MG immediate release tablet Take 1 tablet (5 mg total) by mouth every 6 (six) hours as needed for severe pain. 03/16/14  Yes [name], MD  sertraline (ZOLOFT) 100 MG tablet Take 100 mg by mouth daily with breakfast.    Yes Historical Provider, MD  sodium fluoride (PREVIDENT 5000 PLUS) 1.1 % CREA dental cream Apply fluoride to tooth brush.  Brush teeth for 2 minutes. Spit out excess-DO NOT swallow. Repeat nightly. 04/09/15  Yes [name], DDS  aspirin EC 81 MG tablet Take 1 tablet (81 mg total) by mouth daily. 02/23/15   Larey Dresser, MD  metFORMIN (GLUCOPHAGE) 500 MG tablet Take 500 mg by mouth 2 (two) times daily. Reported on 07/06/2015 07/28/14   Historical Provider, MD  ondansetron (ZOFRAN) 8 MG tablet Every 12 hours as needed for nausea/vomiting.  Begin  on day 3 after chemo. Patient not taking: Reported on 01/01/2016 02/21/15   [name], MD  prochlorperazine (COMPAZINE) 10 MG tablet Take 1 tablet (10 mg total) by mouth every 6 (six) hours as needed for nausea or vomiting. Patient not taking: Reported on 01/01/2016 02/21/15   [name], MD     Family History  Problem Relation Age of Onset  . Aneurysm Mother 29    brain  . Obesity Mother   . Heart disease Mother   . AAA (abdominal aortic aneurysm) Mother     dx in her 21s  . Hodgkin's lymphoma Father     dx in his 25s, and again in his 61s  . Diabetes Father   . Cancer Brother     NOS dx in his 78s  . Bone cancer Maternal Uncle 40    don't know if primary or metastises    Social History   Social History  . Marital status: Divorced    Spouse name: N/A  . Number of children: 0  . Years of education: N/A   Occupational History  .  [employer]   Social History Main Topics  . Smoking status: Former Smoker    Packs/day: 1.00    Years: 31.00    Types: Cigarettes    Quit date: 06/07/2012  . Smokeless tobacco: Never Used  . Alcohol use No  . Drug use: No  . Sexual activity: Not Currently   Other Topics Concern  . Not on file   Social History Narrative  . No narrative on file     Review of Systems: A 12 point ROS discussed and pertinent positives are indicated in the HPI above.  All other systems are negative.  Review of Systems  Vital Signs: BP 128/85 (BP Location: Left Arm, Patient Position: Sitting, Cuff Size: Normal)   Pulse (!) 102   Temp 98 F (36.7 C) (Oral)   Resp 14   Ht 5\' 9"  (1.753 m)   Wt 180 lb (81.6 kg)   SpO2 100%   BMI 26.58 kg/m   Physical Exam  Constitutional: He is oriented to person, place, and time. He appears well-developed and well-nourished.  HENT:  Head: Normocephalic and atraumatic.  Eyes: No scleral icterus.  Cardiovascular: Normal rate.   Pulmonary/Chest: Effort normal.  Abdominal: Soft. He exhibits distension.    Neurological: He is alert and oriented to person, place, and time.  Skin: Skin is warm and dry.  Psychiatric: He has a normal mood and affect. His behavior is normal.  Nursing note and vitals reviewed.    Imaging: Ct Abdomen Wo Contrast  Result Date: 01/01/2016 CLINICAL DATA:  50 year old male with a history of visceral aneurysm, treated with coil and liquid embolic [id number]. EXAM: CT ABDOMEN WITHOUT CONTRAST TECHNIQUE: Multidetector CT imaging of the abdomen was performed following the standard protocol without IV contrast. COMPARISON:  CT 12/26/2014, 04/20/2014, angiogram 02/10/2013. CT 01/24/2013 FINDINGS: Lower chest: Small, 2 mm - 3 mm nodules at the left lung base again demonstrated on  the first image. No pleural effusion or confluent airspace disease. Native coronary disease. No pericardial fluid/ thickening. Hepatobiliary: No focal liver abnormality is seen. No gallstones, gallbladder wall thickening, or biliary dilatation.Unremarkable gallbladder. Pancreas: No peripancreatic fluid or inflammatory changes. Spleen: Normal in size without focal abnormality. Adrenals/Urinary Tract: Right adrenal gland unremarkable.  Left adrenal gland unremarkable. Right kidney: No right-sided hydronephrosis. No nephrolithiasis. Left Kidney: No left-sided hydronephrosis or nephrolithiasis. There is no evidence of left or right perinephric stranding. Course of the bilateral ureters unremarkable. Unremarkable appearance of the urinary bladder. Stomach/Bowel: Unremarkable appearance of stomach and small bowel. No transition point. Normal appendix. No significant stool burden. No diverticular disease. No associated inflammatory changes. Vascular/Lymphatic: Note that the vasculature is not well evaluated due to the exclusion of intravenous contrast. Post treatment changes of prior embolization of aberrant left hepatic artery fusiform aneurysm with streak artifact secondary to the coil mass, and internal complexity due  to a combination of calcifications and liquid embolic agent. The axial dimensions are decreased in size from the comparison CT, with the longest dimensions 7.6 cm compared to 8 cm and the short axis measuring 5 cm compared to 5.6 cm. No associated inflammatory changes within the adjacent fat. Minimal aortic atherosclerotic calcifications. Re- demonstration of hazy borderline enlarged lymph nodes of the retroperitoneal nodal stations in the para-aortic/iliac nodal stations. Index lymph node of the left common iliac region is unchanged in size from the comparison measuring approximately 1.4 cm. Additional lymph nodes along the left periaortic stations are relatively unchanged. Other: No abdominal wall hernia. Musculoskeletal: Benign expansile changes of the left L1 vertebral body involving the left neural foramen and the exiting left L1 nerve root, as was demonstrated previously. Again, this is most likely representative of a nerve root cyst or a schwannoma, less likely dural ectasia. IMPRESSION: Noncontrast CT again demonstrates post treatment changes of embolization of left hepatic artery aneurysm. Although lack of contrast somewhat limits evaluation, the overall dimensions are smaller than the comparison status post treatment. Re- demonstration of retroperitoneal lymph nodes in this patient with history of prior lymphoma, similar in size and distribution to comparison studies. Unchanged appearance of small left lower lobe lung nodules. These have been unchanged dating to the comparison studies of 01/24/2013. Aortic atherosclerosis Signed, Dulcy Fanny. Earleen Newport, DO [facility] Electronically Signed   By: Corrie Mckusick D.O.   On: 01/01/2016 13:41    Labs:  CBC:  Recent Labs  05/22/15 1126 06/04/15 0924 07/06/15 1034 07/12/15 1418  WBC 4.2 3.6* 5.7 5.4  HGB 12.5* 13.1 13.3 12.9*  HCT 36.6* 36.7* 37.9* 37.1*  PLT 244 170 186 235    COAGS:  Recent  Labs  01/03/15 1407 03/28/15 1100  INR 1.15 1.15  APTT  --  28    BMP:  Recent Labs  05/22/15 1126 06/04/15 0924 07/06/15 1034 07/12/15 1418  NA 135* 135* 139 137  K 4.3 4.5 4.5 4.1  CO2 25 26 25 27   GLUCOSE 388* 405* 144* 139  BUN 14.4 13.7 18.2 15.1  CALCIUM 10.0 10.1 10.2 10.4  CREATININE 1.5* 1.6* 1.3 1.5*    LIVER FUNCTION TESTS:  Recent Labs  05/22/15 1126 06/04/15 0924 07/06/15 1034 07/12/15 1418  BILITOT 0.57 0.42 0.46 0.40  AST 58* 46* 41* 33  ALT 56* 47 44 37  ALKPHOS 100 103 75 80  PROT 8.0 7.9 8.3 8.2  ALBUMIN 4.1 4.0 4.1 4.1    TUMOR MARKERS: No results for input(s):  AFPTM, CEA, CA199, CHROMGRNA in the last 8760 hours.  Assessment and Plan:  Doing quite well now nearly 2 years status post coil and liquid embolic embolization of a giant aneurysm of the aberrant left hepatic artery. Additionally, he has undergone extensive treatments for his lymphoma and is currently in remission.  On CT today, his aneurysm sac measures slightly smaller than it did a year ago consistent with successful endovascular therapy.   1.) Return clinic visit with CTA of the abdomen (renal function permitting, otherwise noncontrast CT abdomen) in 2 years.    Electronically Signed: [name] 01/01/2016, 4:33 PM   I spent a total of  15 Minutes in face to face in clinical consultation, greater than 50% of which was counseling/coordinating care for hepatic artery aneurysm.

## 2016-01-18 ENCOUNTER — Encounter: Payer: Self-pay | Admitting: Interventional Radiology

## 2016-08-06 ENCOUNTER — Ambulatory Visit: Admission: RE | Admit: 2016-08-06 | Discharge: 2016-08-06 | Disposition: A | Discharge status: Home / Self Care

## 2016-08-06 DIAGNOSIS — Z9481 Bone marrow transplant status: Secondary | ICD-10-CM

## 2016-08-06 DIAGNOSIS — C8190 Hodgkin lymphoma, unspecified, unspecified site: Principal | ICD-10-CM

## 2016-08-06 DIAGNOSIS — Z794 Long term (current) use of insulin: Secondary | ICD-10-CM

## 2016-08-06 DIAGNOSIS — E039 Hypothyroidism, unspecified: Secondary | ICD-10-CM

## 2016-08-06 DIAGNOSIS — F329 Major depressive disorder, single episode, unspecified: Secondary | ICD-10-CM

## 2016-08-06 DIAGNOSIS — D899 Disorder involving the immune mechanism, unspecified: Secondary | ICD-10-CM

## 2016-08-06 DIAGNOSIS — E1139 Type 2 diabetes mellitus with other diabetic ophthalmic complication: Secondary | ICD-10-CM

## 2016-08-06 DIAGNOSIS — Z9484 Stem cells transplant status: Secondary | ICD-10-CM

## 2016-08-06 DIAGNOSIS — C8120 Mixed cellularity classical Hodgkin lymphoma, unspecified site: Secondary | ICD-10-CM

## 2016-08-22 MED ORDER — SERTRALINE 100 MG TABLET
ORAL_TABLET | Freq: Every day | ORAL | 3 refills | 0 days | Status: CP
Start: 2016-08-22 — End: 2017-09-16

## 2016-09-03 ENCOUNTER — Ambulatory Visit
Admission: RE | Admit: 2016-09-03 | Discharge: 2016-09-03 | Disposition: A | Discharge status: Home / Self Care | Attending: Oncology | Admitting: Oncology

## 2016-09-03 ENCOUNTER — Ambulatory Visit: Admission: RE | Admit: 2016-09-03 | Discharge: 2016-09-03 | Disposition: A | Discharge status: Home / Self Care

## 2016-09-03 ENCOUNTER — Ambulatory Visit
Admission: RE | Admit: 2016-09-03 | Discharge: 2016-09-03 | Disposition: A | Discharge status: Home / Self Care | Attending: Hematology & Oncology

## 2016-09-03 ENCOUNTER — Ambulatory Visit
Admission: RE | Admit: 2016-09-03 | Discharge: 2016-09-03 | Disposition: A | Discharge status: Home / Self Care | Payer: Commercial Managed Care - PPO

## 2016-09-03 DIAGNOSIS — C8120 Mixed cellularity classical Hodgkin lymphoma, unspecified site: Principal | ICD-10-CM

## 2016-09-03 DIAGNOSIS — D899 Disorder involving the immune mechanism, unspecified: Secondary | ICD-10-CM

## 2016-09-03 DIAGNOSIS — Z9481 Bone marrow transplant status: Principal | ICD-10-CM

## 2016-09-03 DIAGNOSIS — C8190 Hodgkin lymphoma, unspecified, unspecified site: Secondary | ICD-10-CM

## 2016-09-03 DIAGNOSIS — Z79899 Other long term (current) drug therapy: Secondary | ICD-10-CM

## 2016-09-03 DIAGNOSIS — Z9484 Stem cells transplant status: Secondary | ICD-10-CM

## 2016-09-03 MED ORDER — ESZOPICLONE 2 MG TABLET
ORAL_TABLET | Freq: Every evening | ORAL | 0 refills | 0 days | Status: CP | PRN
Start: 2016-09-03 — End: 2016-09-09

## 2016-09-08 ENCOUNTER — Other Ambulatory Visit: Payer: Self-pay | Admitting: Cardiology

## 2016-09-09 MED ORDER — ESZOPICLONE 2 MG TABLET
ORAL_TABLET | Freq: Every evening | ORAL | 0 refills | 0.00000 days | Status: CP | PRN
Start: 2016-09-09 — End: 2017-07-08

## 2016-09-29 MED ORDER — LEVOTHYROXINE 200 MCG TABLET
ORAL_TABLET | Freq: Every day | ORAL | 0 refills | 0 days | Status: CP
Start: 2016-09-29 — End: 2016-11-05

## 2016-10-01 ENCOUNTER — Ambulatory Visit: Admission: RE | Admit: 2016-10-01 | Discharge: 2016-10-01 | Disposition: A | Discharge status: Home / Self Care

## 2016-10-01 ENCOUNTER — Ambulatory Visit
Admission: RE | Admit: 2016-10-01 | Discharge: 2016-10-01 | Disposition: A | Discharge status: Home / Self Care | Attending: Hematology & Oncology | Admitting: Hematology & Oncology

## 2016-10-01 ENCOUNTER — Ambulatory Visit
Admission: RE | Admit: 2016-10-01 | Discharge: 2016-10-01 | Disposition: A | Discharge status: Home / Self Care | Payer: Commercial Managed Care - PPO

## 2016-10-01 DIAGNOSIS — C8190 Hodgkin lymphoma, unspecified, unspecified site: Principal | ICD-10-CM

## 2016-10-01 DIAGNOSIS — R591 Generalized enlarged lymph nodes: Secondary | ICD-10-CM

## 2016-10-01 DIAGNOSIS — Z9484 Stem cells transplant status: Secondary | ICD-10-CM

## 2016-10-01 DIAGNOSIS — C8120 Mixed cellularity classical Hodgkin lymphoma, unspecified site: Secondary | ICD-10-CM

## 2016-10-01 DIAGNOSIS — E039 Hypothyroidism, unspecified: Secondary | ICD-10-CM

## 2016-10-01 DIAGNOSIS — E119 Type 2 diabetes mellitus without complications: Secondary | ICD-10-CM

## 2016-10-01 DIAGNOSIS — Z9481 Bone marrow transplant status: Secondary | ICD-10-CM

## 2016-10-01 DIAGNOSIS — D899 Disorder involving the immune mechanism, unspecified: Secondary | ICD-10-CM

## 2016-10-06 ENCOUNTER — Other Ambulatory Visit: Payer: Self-pay | Admitting: Cardiology

## 2016-10-29 ENCOUNTER — Ambulatory Visit
Admission: RE | Admit: 2016-10-29 | Discharge: 2016-10-29 | Disposition: A | Discharge status: Home / Self Care | Payer: Commercial Managed Care - PPO

## 2016-10-29 DIAGNOSIS — C8190 Hodgkin lymphoma, unspecified, unspecified site: Principal | ICD-10-CM

## 2016-11-05 ENCOUNTER — Ambulatory Visit
Admission: RE | Admit: 2016-11-05 | Discharge: 2016-11-05 | Disposition: A | Discharge status: Home / Self Care | Payer: Commercial Managed Care - PPO

## 2016-11-05 ENCOUNTER — Ambulatory Visit: Admission: RE | Admit: 2016-11-05 | Discharge: 2016-11-05 | Disposition: A | Discharge status: Home / Self Care

## 2016-11-05 DIAGNOSIS — Z9484 Stem cells transplant status: Secondary | ICD-10-CM

## 2016-11-05 DIAGNOSIS — C8120 Mixed cellularity classical Hodgkin lymphoma, unspecified site: Secondary | ICD-10-CM

## 2016-11-05 DIAGNOSIS — Z9481 Bone marrow transplant status: Principal | ICD-10-CM

## 2016-11-05 DIAGNOSIS — C8190 Hodgkin lymphoma, unspecified, unspecified site: Secondary | ICD-10-CM

## 2016-11-05 MED ORDER — FOLIC ACID 1 MG TABLET
ORAL_TABLET | Freq: Every day | ORAL | 3 refills | 0.00000 days | Status: CP
Start: 2016-11-05 — End: ?

## 2016-12-03 ENCOUNTER — Ambulatory Visit: Admission: RE | Admit: 2016-12-03 | Discharge: 2016-12-03 | Disposition: A | Discharge status: Home / Self Care

## 2016-12-03 ENCOUNTER — Ambulatory Visit
Admission: RE | Admit: 2016-12-03 | Discharge: 2016-12-03 | Disposition: A | Discharge status: Home / Self Care | Attending: Hematology & Oncology

## 2016-12-03 DIAGNOSIS — C8190 Hodgkin lymphoma, unspecified, unspecified site: Principal | ICD-10-CM

## 2016-12-03 DIAGNOSIS — Z9481 Bone marrow transplant status: Principal | ICD-10-CM

## 2016-12-03 DIAGNOSIS — D899 Disorder involving the immune mechanism, unspecified: Secondary | ICD-10-CM

## 2016-12-03 DIAGNOSIS — Z9484 Stem cells transplant status: Secondary | ICD-10-CM

## 2016-12-12 ENCOUNTER — Other Ambulatory Visit: Payer: Self-pay | Admitting: Cardiology

## 2016-12-29 ENCOUNTER — Ambulatory Visit
Admission: RE | Admit: 2016-12-29 | Discharge: 2016-12-29 | Disposition: A | Discharge status: Home / Self Care | Payer: Commercial Managed Care - PPO

## 2016-12-29 ENCOUNTER — Ambulatory Visit: Admission: RE | Admit: 2016-12-29 | Discharge: 2016-12-29 | Disposition: A | Discharge status: Home / Self Care

## 2016-12-29 DIAGNOSIS — C8190 Hodgkin lymphoma, unspecified, unspecified site: Principal | ICD-10-CM

## 2016-12-31 ENCOUNTER — Ambulatory Visit: Admission: RE | Admit: 2016-12-31 | Discharge: 2016-12-31 | Disposition: A | Discharge status: Home / Self Care

## 2016-12-31 DIAGNOSIS — Z9481 Bone marrow transplant status: Secondary | ICD-10-CM

## 2016-12-31 DIAGNOSIS — D899 Disorder involving the immune mechanism, unspecified: Secondary | ICD-10-CM

## 2016-12-31 DIAGNOSIS — E119 Type 2 diabetes mellitus without complications: Secondary | ICD-10-CM

## 2016-12-31 DIAGNOSIS — C8190 Hodgkin lymphoma, unspecified, unspecified site: Principal | ICD-10-CM

## 2016-12-31 DIAGNOSIS — C8120 Mixed cellularity classical Hodgkin lymphoma, unspecified site: Secondary | ICD-10-CM

## 2016-12-31 DIAGNOSIS — Z794 Long term (current) use of insulin: Secondary | ICD-10-CM

## 2016-12-31 MED ORDER — OMEGA-3 ACID ETHYL ESTERS 1 GRAM CAPSULE
ORAL_CAPSULE | Freq: Two times a day (BID) | ORAL | 0 refills | 0.00000 days | Status: CP
Start: 2016-12-31 — End: 2017-04-13

## 2017-02-25 ENCOUNTER — Ambulatory Visit
Admit: 2017-02-25 | Discharge: 2017-02-26 | Payer: PRIVATE HEALTH INSURANCE | Attending: Hematology & Oncology | Primary: Hematology & Oncology

## 2017-02-25 ENCOUNTER — Other Ambulatory Visit: Admit: 2017-02-25 | Discharge: 2017-02-26 | Payer: PRIVATE HEALTH INSURANCE

## 2017-02-25 DIAGNOSIS — Z9484 Stem cells transplant status: Principal | ICD-10-CM

## 2017-02-25 DIAGNOSIS — C8120 Mixed cellularity classical Hodgkin lymphoma, unspecified site: Secondary | ICD-10-CM

## 2017-02-25 DIAGNOSIS — J00 Acute nasopharyngitis [common cold]: Principal | ICD-10-CM

## 2017-02-25 DIAGNOSIS — Z9481 Bone marrow transplant status: Secondary | ICD-10-CM

## 2017-03-12 IMAGING — CT CT ABDOMEN W/O CM
2 of 4 series · 15 of 46 positions shown, 17 images · non-contrast
Comparison: CT 12/26/2014, 04/20/2014, angiogram 02/10/2013. CT
01/24/2013

CLINICAL DATA: 50-year-old male with a history of visceral
aneurysm, treated with coil and liquid embolic 02/04/2013.

EXAM:
CT ABDOMEN WITHOUT CONTRAST
TECHNIQUE: Multidetector CT imaging of the abdomen was performed following the
standard protocol without IV contrast.

[Series 2: axial st · axial · 0.79mm/px · z∈[-531,-281]mm · 12 of 56 slices shown, 14 images]
[im 3/56  soft-tissue]
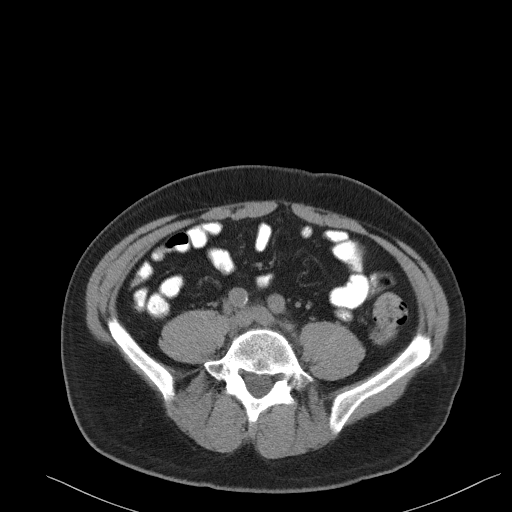
[im 3/56  bone]
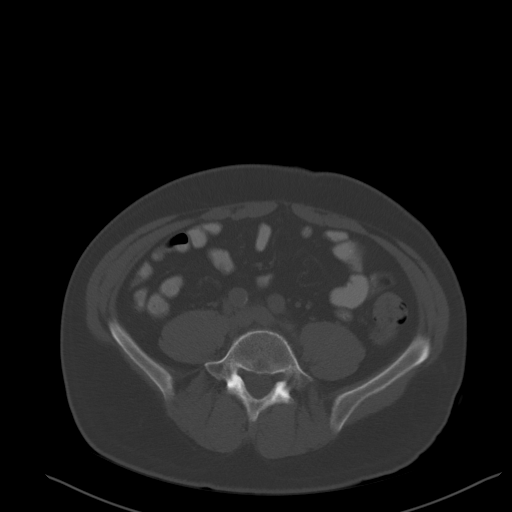
[im 8/56  soft-tissue]
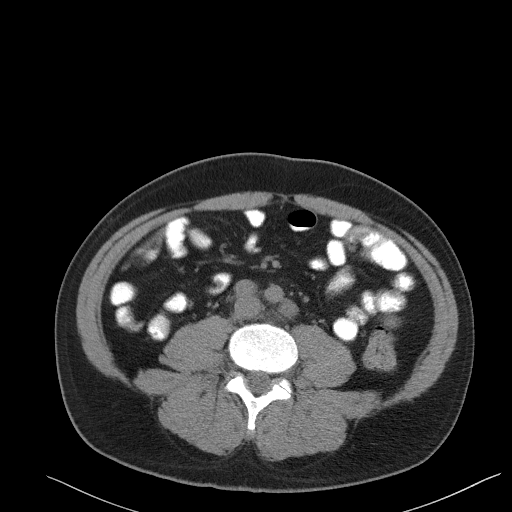
[im 13/56  soft-tissue]
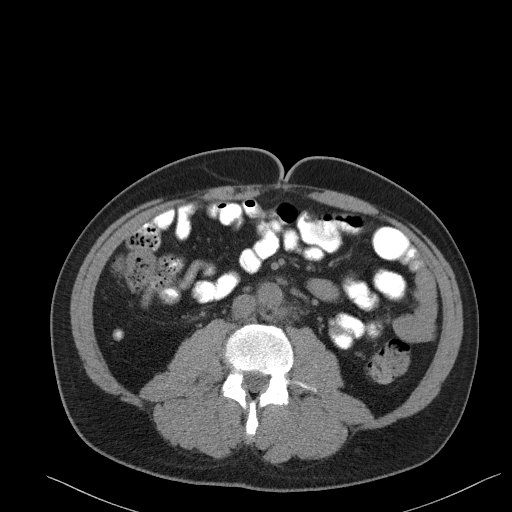
[im 18/56  soft-tissue]
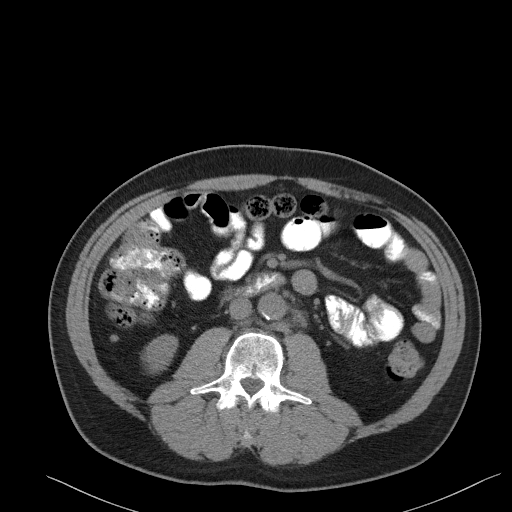
[im 21/56  soft-tissue]
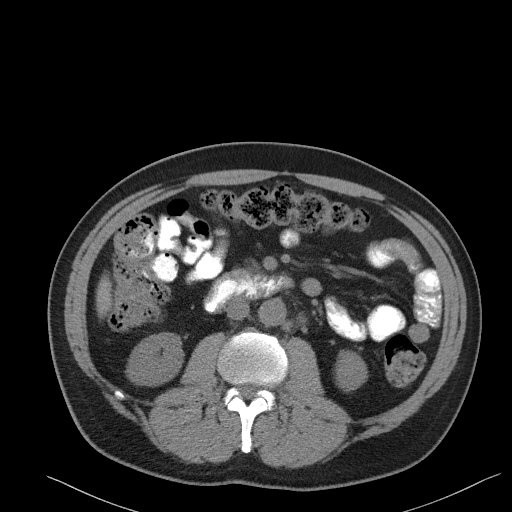
[im 26/56  soft-tissue]
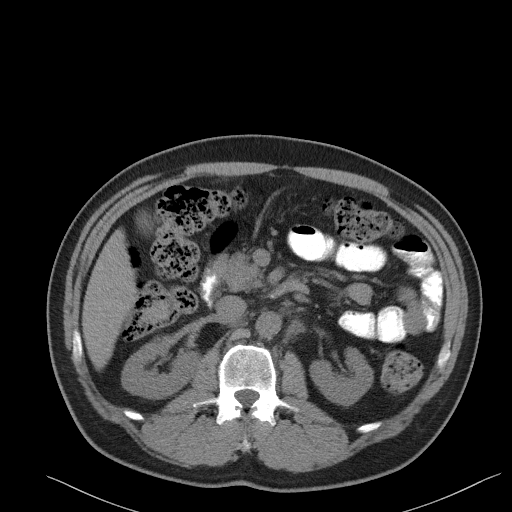
[im 31/56  soft-tissue]
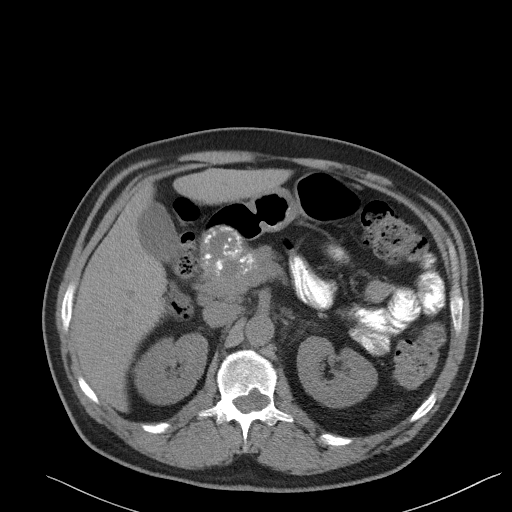
[im 36/56  soft-tissue]
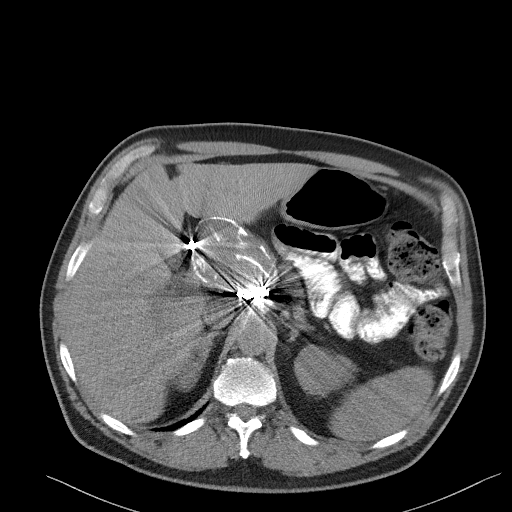
[im 38/56  soft-tissue]
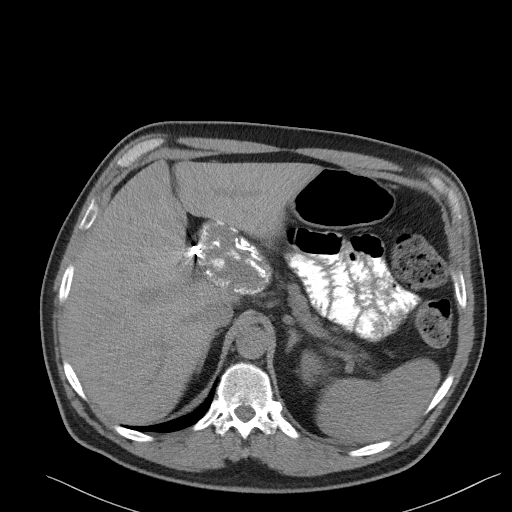
[im 38/56  bone]
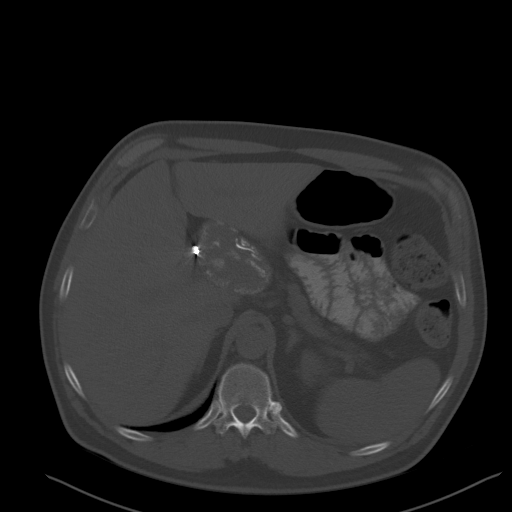
[im 43/56  soft-tissue]
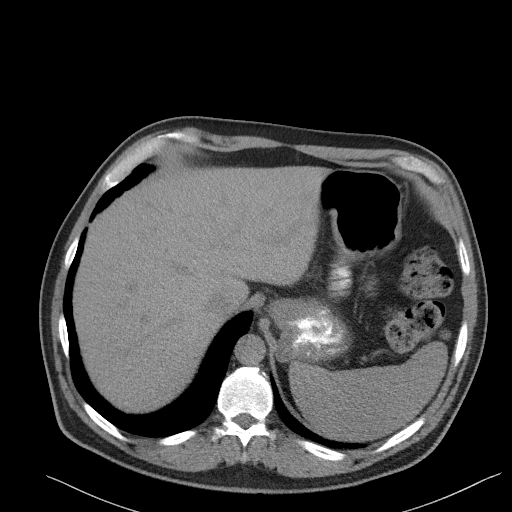
[im 48/56  soft-tissue]
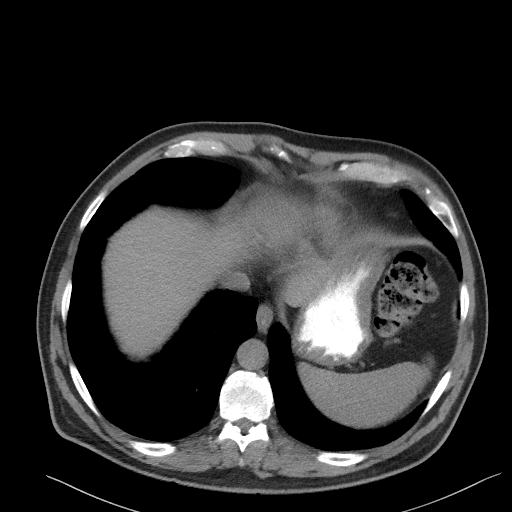
[im 53/56  soft-tissue]
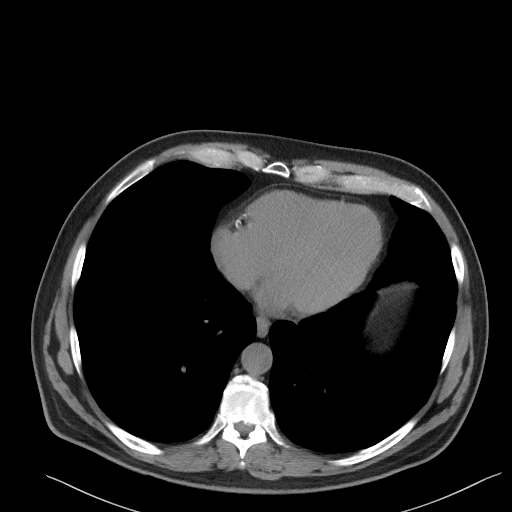

[Series 5: coronal st · coronal · 0.70mm/px · 3 of 107 slices shown]
[im 36/107  soft-tissue]
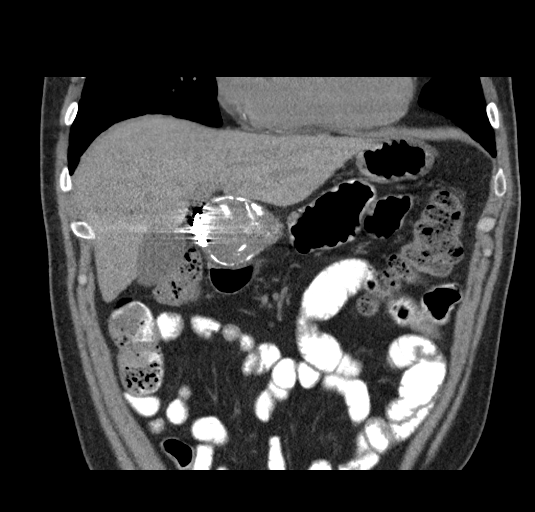
[im 48/107  soft-tissue]
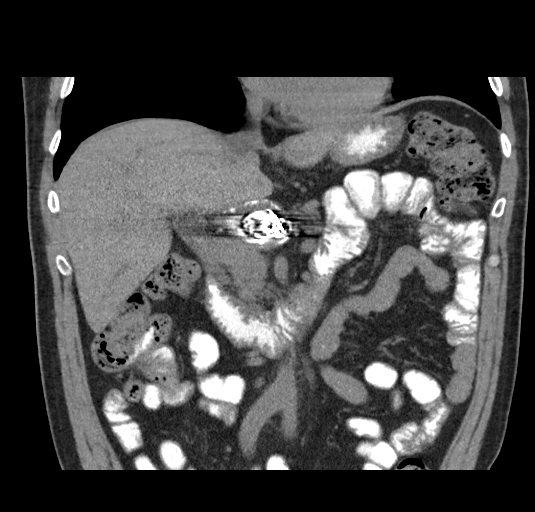
[im 59/107  soft-tissue]
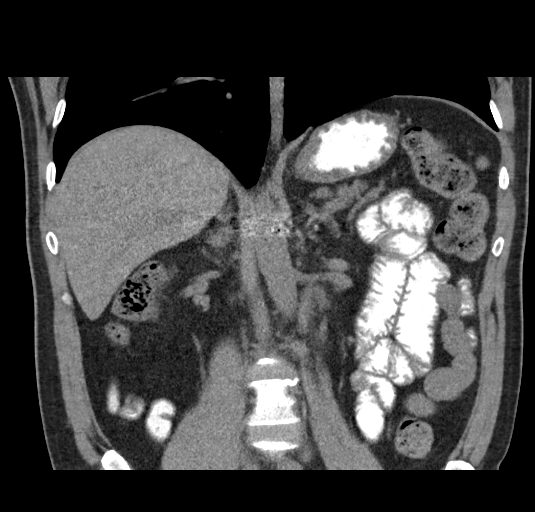

[15 of 46 positions shown; findings below may reference images not displayed]

FINDINGS: Lower chest: Small, 2 mm - 3 mm nodules at the left lung base again
demonstrated on the first image. No pleural effusion or confluent
airspace disease. Native coronary disease.

No pericardial fluid/ thickening.

Hepatobiliary: No focal liver abnormality is seen. No gallstones,
gallbladder wall thickening, or biliary dilatation.Unremarkable
gallbladder.

Pancreas: No peripancreatic fluid or inflammatory changes.

Spleen: Normal in size without focal abnormality.

Adrenals/Urinary Tract:

Right adrenal gland unremarkable.  Left adrenal gland unremarkable.

Right kidney:

No right-sided hydronephrosis. No nephrolithiasis.

Left Kidney: No left-sided hydronephrosis or nephrolithiasis.

There is no evidence of left or right perinephric stranding.

Course of the bilateral ureters unremarkable.

Unremarkable appearance of the urinary bladder.

Stomach/Bowel: Unremarkable appearance of stomach and small bowel.
No transition point. Normal appendix. No significant stool burden.
No diverticular disease. No associated inflammatory changes.

Vascular/Lymphatic: Note that the vasculature is not well evaluated
due to the exclusion of intravenous contrast.

Post treatment changes of prior embolization of aberrant left
hepatic artery fusiform aneurysm with streak artifact secondary to
the coil mass, and internal complexity due to a combination of
calcifications and liquid embolic agent. The axial dimensions are
decreased in size from the comparison CT, with the longest
dimensions 7.6 cm compared to 8 cm and the short axis measuring 5 cm
compared to 5.6 cm. No associated inflammatory changes within the
adjacent fat.

Minimal aortic atherosclerotic calcifications.

Re- demonstration of hazy borderline enlarged lymph nodes of the
retroperitoneal nodal stations in the para-aortic/iliac nodal
stations. Index lymph node of the left common iliac region is
unchanged in size from the comparison measuring approximately
cm. Additional lymph nodes along the left periaortic stations are
relatively unchanged.

Other: No abdominal wall hernia.

Musculoskeletal: Benign expansile changes of the left L1 vertebral
body involving the left neural foramen and the exiting left L1 nerve
root, as was demonstrated previously. Again, this is most likely
representative of a nerve root cyst or a schwannoma, less likely
dural ectasia.
IMPRESSION: Noncontrast CT again demonstrates post treatment changes of
embolization of left hepatic artery aneurysm. Although lack of
contrast somewhat limits evaluation, the overall dimensions are
smaller than the comparison status post treatment.

Re- demonstration of retroperitoneal lymph nodes in this patient
with history of prior lymphoma, similar in size and distribution to
comparison studies.

Unchanged appearance of small left lower lobe lung nodules. These
have been unchanged dating to the comparison studies of 01/24/2013.

Aortic atherosclerosis

## 2017-03-25 ENCOUNTER — Other Ambulatory Visit: Admit: 2017-03-25 | Discharge: 2017-03-26 | Payer: PRIVATE HEALTH INSURANCE

## 2017-03-25 ENCOUNTER — Ambulatory Visit
Admit: 2017-03-25 | Discharge: 2017-03-26 | Payer: PRIVATE HEALTH INSURANCE | Attending: Hematology & Oncology | Primary: Hematology & Oncology

## 2017-03-25 DIAGNOSIS — D899 Disorder involving the immune mechanism, unspecified: Secondary | ICD-10-CM

## 2017-03-25 DIAGNOSIS — Z9481 Bone marrow transplant status: Principal | ICD-10-CM

## 2017-03-25 DIAGNOSIS — C8120 Mixed cellularity classical Hodgkin lymphoma, unspecified site: Secondary | ICD-10-CM

## 2017-04-10 MED ORDER — LOVASTATIN 40 MG TABLET
ORAL_TABLET | Freq: Every evening | ORAL | 5 refills | 0 days | Status: CP
Start: 2017-04-10 — End: 2017-07-13

## 2017-04-14 MED ORDER — OMEGA-3 ACID ETHYL ESTERS 1 GRAM CAPSULE
ORAL_CAPSULE | Freq: Two times a day (BID) | ORAL | 0 refills | 0.00000 days | Status: CP
Start: 2017-04-14 — End: 2017-05-18

## 2017-04-22 ENCOUNTER — Ambulatory Visit: Admit: 2017-04-22 | Discharge: 2017-04-23 | Payer: PRIVATE HEALTH INSURANCE

## 2017-04-22 DIAGNOSIS — Z9481 Bone marrow transplant status: Secondary | ICD-10-CM

## 2017-04-22 DIAGNOSIS — C8190 Hodgkin lymphoma, unspecified, unspecified site: Principal | ICD-10-CM

## 2017-05-18 MED ORDER — OMEGA-3 ACID ETHYL ESTERS 1 GRAM CAPSULE
ORAL_CAPSULE | Freq: Two times a day (BID) | ORAL | 0 refills | 0 days | Status: CP
Start: 2017-05-18 — End: 2017-06-15

## 2017-06-03 ENCOUNTER — Other Ambulatory Visit: Admit: 2017-06-03 | Discharge: 2017-06-04 | Payer: PRIVATE HEALTH INSURANCE

## 2017-06-03 ENCOUNTER — Ambulatory Visit
Admit: 2017-06-03 | Discharge: 2017-06-04 | Payer: PRIVATE HEALTH INSURANCE | Attending: Hematology & Oncology | Primary: Hematology & Oncology

## 2017-06-03 DIAGNOSIS — D89811 Chronic graft-versus-host disease: Secondary | ICD-10-CM

## 2017-06-03 DIAGNOSIS — C8120 Mixed cellularity classical Hodgkin lymphoma, unspecified site: Principal | ICD-10-CM

## 2017-06-03 DIAGNOSIS — Z9481 Bone marrow transplant status: Principal | ICD-10-CM

## 2017-06-03 DIAGNOSIS — J00 Acute nasopharyngitis [common cold]: Secondary | ICD-10-CM

## 2017-06-04 MED ORDER — PREDNISOLONE ACETATE 1 % EYE DROPS,SUSPENSION
Freq: Two times a day (BID) | OPHTHALMIC | 0 refills | 0.00000 days | Status: CP
Start: 2017-06-04 — End: 2017-06-26

## 2017-06-15 MED ORDER — OMEGA-3 ACID ETHYL ESTERS 1 GRAM CAPSULE
ORAL_CAPSULE | Freq: Two times a day (BID) | ORAL | 0 refills | 0.00000 days | Status: CP
Start: 2017-06-15 — End: 2017-11-04

## 2017-06-24 ENCOUNTER — Other Ambulatory Visit: Admit: 2017-06-24 | Discharge: 2017-06-25 | Payer: PRIVATE HEALTH INSURANCE

## 2017-06-24 ENCOUNTER — Ambulatory Visit
Admit: 2017-06-24 | Discharge: 2017-06-25 | Payer: PRIVATE HEALTH INSURANCE | Attending: Hematology & Oncology | Primary: Hematology & Oncology

## 2017-06-24 DIAGNOSIS — D89811 Chronic graft-versus-host disease: Secondary | ICD-10-CM

## 2017-06-24 DIAGNOSIS — Z9481 Bone marrow transplant status: Principal | ICD-10-CM

## 2017-06-24 DIAGNOSIS — D89813 Graft-versus-host disease, unspecified: Secondary | ICD-10-CM

## 2017-06-26 ENCOUNTER — Ambulatory Visit: Admit: 2017-06-26 | Discharge: 2017-06-27 | Payer: PRIVATE HEALTH INSURANCE

## 2017-06-26 DIAGNOSIS — H16223 Keratoconjunctivitis sicca, not specified as Sjogren's, bilateral: Secondary | ICD-10-CM

## 2017-06-26 DIAGNOSIS — D89813 Graft-versus-host disease, unspecified: Principal | ICD-10-CM

## 2017-07-01 ENCOUNTER — Ambulatory Visit
Admit: 2017-07-01 | Discharge: 2017-07-01 | Payer: PRIVATE HEALTH INSURANCE | Attending: Hematology & Oncology | Primary: Hematology & Oncology

## 2017-07-01 ENCOUNTER — Other Ambulatory Visit: Admit: 2017-07-01 | Discharge: 2017-07-01 | Payer: PRIVATE HEALTH INSURANCE

## 2017-07-01 DIAGNOSIS — C8120 Mixed cellularity classical Hodgkin lymphoma, unspecified site: Principal | ICD-10-CM

## 2017-07-01 DIAGNOSIS — Z9481 Bone marrow transplant status: Secondary | ICD-10-CM

## 2017-07-01 DIAGNOSIS — D899 Disorder involving the immune mechanism, unspecified: Secondary | ICD-10-CM

## 2017-07-01 DIAGNOSIS — D89811 Chronic graft-versus-host disease: Secondary | ICD-10-CM

## 2017-07-01 MED ORDER — PREDNISONE 20 MG TABLET
ORAL_TABLET | Freq: Every day | ORAL | 0 refills | 0.00000 days | Status: CP
Start: 2017-07-01 — End: 2017-07-13

## 2017-07-08 ENCOUNTER — Ambulatory Visit: Admit: 2017-07-08 | Discharge: 2017-07-08 | Payer: PRIVATE HEALTH INSURANCE | Attending: Oncology | Primary: Oncology

## 2017-07-08 ENCOUNTER — Other Ambulatory Visit: Admit: 2017-07-08 | Discharge: 2017-07-08 | Payer: PRIVATE HEALTH INSURANCE

## 2017-07-08 ENCOUNTER — Ambulatory Visit
Admit: 2017-07-08 | Discharge: 2017-07-08 | Payer: PRIVATE HEALTH INSURANCE | Attending: Nurse Practitioner | Primary: Nurse Practitioner

## 2017-07-08 DIAGNOSIS — C8120 Mixed cellularity classical Hodgkin lymphoma, unspecified site: Secondary | ICD-10-CM

## 2017-07-08 DIAGNOSIS — Z9481 Bone marrow transplant status: Secondary | ICD-10-CM

## 2017-07-08 DIAGNOSIS — T8609 Other complications of bone marrow transplant: Principal | ICD-10-CM

## 2017-07-08 DIAGNOSIS — D89813 Graft-versus-host disease, unspecified: Secondary | ICD-10-CM

## 2017-07-08 DIAGNOSIS — E119 Type 2 diabetes mellitus without complications: Secondary | ICD-10-CM

## 2017-07-08 DIAGNOSIS — D899 Disorder involving the immune mechanism, unspecified: Secondary | ICD-10-CM

## 2017-07-08 DIAGNOSIS — Z9484 Stem cells transplant status: Principal | ICD-10-CM

## 2017-07-08 DIAGNOSIS — E781 Pure hyperglyceridemia: Secondary | ICD-10-CM

## 2017-07-08 MED ORDER — POSACONAZOLE 100 MG TABLET,DELAYED RELEASE
ORAL_TABLET | Freq: Every day | ORAL | 1 refills | 0 days | Status: CP
Start: 2017-07-08 — End: 2017-07-13

## 2017-07-08 MED ORDER — ESZOPICLONE 2 MG TABLET
ORAL_TABLET | Freq: Every evening | ORAL | 2 refills | 0.00000 days | Status: CP | PRN
Start: 2017-07-08 — End: 2017-11-04

## 2017-07-08 MED ORDER — SULFAMETHOXAZOLE 800 MG-TRIMETHOPRIM 160 MG TABLET
ORAL_TABLET | ORAL | 1 refills | 0 days | Status: CP
Start: 2017-07-08 — End: 2017-09-30

## 2017-07-08 MED ORDER — DEXAMETHASONE 0.5 MG/5 ML ORAL SOLUTION
Freq: Three times a day (TID) | ORAL | 3 refills | 0 days | Status: CP
Start: 2017-07-08 — End: 2017-08-26

## 2017-07-08 MED ORDER — SIMETHICONE 80 MG CHEWABLE TABLET
ORAL_TABLET | Freq: Four times a day (QID) | ORAL | 0 refills | 0.00000 days | Status: CP | PRN
Start: 2017-07-08 — End: 2017-08-10

## 2017-07-08 MED ORDER — LIDOCAINE-DIPHENHYD-AL-MAG-SIM 200 MG-25 MG-400 MG-40MG/30ML MOUTHWASH
Freq: Four times a day (QID) | OROMUCOSAL | 0 refills | 0 days | Status: CP | PRN
Start: 2017-07-08 — End: 2017-08-26

## 2017-07-08 MED ORDER — PANTOPRAZOLE 40 MG TABLET,DELAYED RELEASE
ORAL_TABLET | Freq: Every day | ORAL | 3 refills | 0.00000 days | Status: SS
Start: 2017-07-08 — End: 2017-07-23

## 2017-07-08 MED ORDER — PROCHLORPERAZINE MALEATE 10 MG TABLET
ORAL_TABLET | Freq: Four times a day (QID) | ORAL | 3 refills | 0.00000 days | Status: CP | PRN
Start: 2017-07-08 — End: 2017-11-04

## 2017-07-08 MED ORDER — LEVOTHYROXINE 25 MCG TABLET
ORAL_TABLET | Freq: Every day | ORAL | 5 refills | 0.00000 days
Start: 2017-07-08 — End: 2018-07-07

## 2017-07-08 MED ORDER — CEFDINIR 300 MG CAPSULE
ORAL_CAPSULE | Freq: Two times a day (BID) | ORAL | 1 refills | 0 days | Status: CP
Start: 2017-07-08 — End: 2017-10-07

## 2017-07-08 MED ORDER — VALACYCLOVIR 500 MG TABLET
ORAL_TABLET | Freq: Every day | ORAL | 1 refills | 0.00000 days | Status: CP
Start: 2017-07-08 — End: 2017-09-21

## 2017-07-13 ENCOUNTER — Ambulatory Visit: Admit: 2017-07-13 | Discharge: 2017-07-13 | Payer: PRIVATE HEALTH INSURANCE | Attending: Oncology | Primary: Oncology

## 2017-07-13 ENCOUNTER — Ambulatory Visit
Admit: 2017-07-13 | Discharge: 2017-07-13 | Payer: PRIVATE HEALTH INSURANCE | Attending: Nurse Practitioner | Primary: Nurse Practitioner

## 2017-07-13 ENCOUNTER — Other Ambulatory Visit: Admit: 2017-07-13 | Discharge: 2017-07-13 | Payer: PRIVATE HEALTH INSURANCE

## 2017-07-13 DIAGNOSIS — Z9484 Stem cells transplant status: Secondary | ICD-10-CM

## 2017-07-13 DIAGNOSIS — D89813 Graft-versus-host disease, unspecified: Secondary | ICD-10-CM

## 2017-07-13 DIAGNOSIS — T8609 Other complications of bone marrow transplant: Secondary | ICD-10-CM

## 2017-07-13 DIAGNOSIS — C8120 Mixed cellularity classical Hodgkin lymphoma, unspecified site: Secondary | ICD-10-CM

## 2017-07-13 DIAGNOSIS — E119 Type 2 diabetes mellitus without complications: Principal | ICD-10-CM

## 2017-07-13 DIAGNOSIS — D899 Disorder involving the immune mechanism, unspecified: Secondary | ICD-10-CM

## 2017-07-13 DIAGNOSIS — Z9481 Bone marrow transplant status: Principal | ICD-10-CM

## 2017-07-13 MED ORDER — PREDNISONE 20 MG TABLET: 80 mg | tablet | Freq: Every day | 3 refills | 0 days | Status: SS

## 2017-07-13 MED ORDER — INSULIN LISPRO (U-100) 100 UNIT/ML SUBCUTANEOUS SOLUTION
2 refills | 0.00000 days | Status: CP
Start: 2017-07-13 — End: 2017-07-13

## 2017-07-13 MED ORDER — LOVASTATIN 40 MG TABLET: 40 mg | tablet | Freq: Every evening | 0 refills | 0 days | Status: AC

## 2017-07-13 MED ORDER — INSULIN LISPRO (U-100) 100 UNIT/ML SUBCUTANEOUS SOLUTION: mL | 2 refills | 0 days | Status: AC

## 2017-07-13 MED ORDER — INSULIN GLARGINE (U-100) 100 UNIT/ML SUBCUTANEOUS SOLUTION
Freq: Every evening | SUBCUTANEOUS | 0 refills | 0.00000 days
Start: 2017-07-13 — End: 2017-07-27

## 2017-07-13 MED ORDER — ISAVUCONAZONIUM SULFATE 186 MG CAPSULE: capsule | 0 refills | 0 days | Status: AC

## 2017-07-13 MED ORDER — LOVASTATIN 40 MG TABLET
Freq: Every evening | ORAL | 0 refills | 0.00000 days | Status: CP
Start: 2017-07-13 — End: 2017-09-01

## 2017-07-13 MED ORDER — PREDNISONE 20 MG TABLET: 80 mg | tablet | Freq: Every day | 3 refills | 0 days | Status: AC

## 2017-07-13 MED ORDER — ISAVUCONAZONIUM SULFATE 186 MG CAPSULE: each | 0 refills | 0 days

## 2017-07-13 MED ORDER — ISAVUCONAZONIUM SULFATE 186 MG CAPSULE
ORAL_CAPSULE | 5 refills | 0.00000 days | Status: CP
Start: 2017-07-13 — End: 2017-07-13

## 2017-07-13 MED ORDER — ISAVUCONAZONIUM SULFATE 186 MG CAPSULE: capsule | 5 refills | 0 days | Status: AC

## 2017-07-13 MED ORDER — PREDNISONE 20 MG TABLET
ORAL_TABLET | Freq: Every day | ORAL | 3 refills | 0.00000 days | Status: SS
Start: 2017-07-13 — End: 2017-07-23

## 2017-07-13 MED ORDER — INSULIN LISPRO (U-100) 100 UNIT/ML SUBCUTANEOUS SOLUTION: mL | 2 refills | 0 days

## 2017-07-13 MED FILL — LOVASTATIN/40MG/TABS: LOVASTATIN/40MG/TABS | 30 days supply | Qty: 30 | Fill #0

## 2017-07-13 MED FILL — CRESEMBA/186 MG/CAPS: CRESEMBA/186 MG/CAPS | 24 days supply | Qty: 56 | Fill #0

## 2017-07-13 MED FILL — PREDNISONE/20MG/TAB: PREDNISONE/20MG/TAB | 30 days supply | Qty: 120 | Fill #0

## 2017-07-15 MED ORDER — INSULIN LISPRO (U-100) 100 UNIT/ML SUBCUTANEOUS PEN
2 refills | 0 days | Status: CP
Start: 2017-07-15 — End: 2017-07-27

## 2017-07-19 ENCOUNTER — Ambulatory Visit
Admit: 2017-07-19 | Discharge: 2017-07-23 | Disposition: A | Discharge status: Home / Self Care | Payer: PRIVATE HEALTH INSURANCE | Source: Other Acute Inpatient Hospital

## 2017-07-19 ENCOUNTER — Encounter
Admit: 2017-07-19 | Discharge: 2017-07-23 | Disposition: A | Discharge status: Home / Self Care | Payer: PRIVATE HEALTH INSURANCE | Source: Other Acute Inpatient Hospital | Attending: Certified Registered"

## 2017-07-19 ENCOUNTER — Emergency Department
Admission: EM | Admit: 2017-07-19 | Discharge: 2017-07-19 | Disposition: A | Discharge status: Other Acute Inpatient Hospital | Payer: Commercial Managed Care - PPO | Attending: Emergency Medicine | Admitting: Emergency Medicine

## 2017-07-19 ENCOUNTER — Encounter: Payer: Self-pay | Admitting: Emergency Medicine

## 2017-07-19 ENCOUNTER — Other Ambulatory Visit: Payer: Self-pay

## 2017-07-19 DIAGNOSIS — K922 Gastrointestinal hemorrhage, unspecified: Principal | ICD-10-CM

## 2017-07-19 DIAGNOSIS — I2510 Atherosclerotic heart disease of native coronary artery without angina pectoris: Secondary | ICD-10-CM | POA: Diagnosis not present

## 2017-07-19 DIAGNOSIS — Z794 Long term (current) use of insulin: Secondary | ICD-10-CM | POA: Diagnosis not present

## 2017-07-19 DIAGNOSIS — I10 Essential (primary) hypertension: Secondary | ICD-10-CM | POA: Diagnosis not present

## 2017-07-19 DIAGNOSIS — Z79899 Other long term (current) drug therapy: Secondary | ICD-10-CM | POA: Diagnosis not present

## 2017-07-19 DIAGNOSIS — Z87891 Personal history of nicotine dependence: Secondary | ICD-10-CM | POA: Diagnosis not present

## 2017-07-19 DIAGNOSIS — E1165 Type 2 diabetes mellitus with hyperglycemia: Secondary | ICD-10-CM | POA: Diagnosis not present

## 2017-07-19 DIAGNOSIS — F329 Major depressive disorder, single episode, unspecified: Secondary | ICD-10-CM | POA: Insufficient documentation

## 2017-07-19 DIAGNOSIS — N179 Acute kidney failure, unspecified: Secondary | ICD-10-CM | POA: Diagnosis not present

## 2017-07-19 DIAGNOSIS — Z85858 Personal history of malignant neoplasm of other endocrine glands: Secondary | ICD-10-CM | POA: Insufficient documentation

## 2017-07-19 DIAGNOSIS — Z8571 Personal history of Hodgkin lymphoma: Secondary | ICD-10-CM | POA: Diagnosis not present

## 2017-07-19 DIAGNOSIS — R739 Hyperglycemia, unspecified: Secondary | ICD-10-CM

## 2017-07-19 DIAGNOSIS — Z7982 Long term (current) use of aspirin: Secondary | ICD-10-CM | POA: Diagnosis not present

## 2017-07-19 DIAGNOSIS — R531 Weakness: Secondary | ICD-10-CM | POA: Diagnosis present

## 2017-07-19 HISTORY — DX: Graft-versus-host disease, unspecified: D89.813

## 2017-07-19 HISTORY — DX: Other complications of bone marrow transplant: T86.09

## 2017-07-19 LAB — CBC WITH DIFFERENTIAL/PLATELET
BASOS ABS: 0 10*3/uL (ref 0–0.1)
BASOS PCT: 0 %
Eosinophils Absolute: 0 10*3/uL (ref 0–0.7)
Eosinophils Relative: 0 %
HEMATOCRIT: 22.1 % — AB (ref 40.0–52.0)
Hemoglobin: 7.4 g/dL — ABNORMAL LOW (ref 13.0–18.0)
LYMPHS PCT: 18 %
Lymphs Abs: 3.2 10*3/uL (ref 1.0–3.6)
MCH: 35.5 pg — ABNORMAL HIGH (ref 26.0–34.0)
MCHC: 33.3 g/dL (ref 32.0–36.0)
MCV: 106.5 fL — AB (ref 80.0–100.0)
Monocytes Absolute: 1.2 10*3/uL — ABNORMAL HIGH (ref 0.2–1.0)
Monocytes Relative: 7 %
NEUTROS ABS: 13.8 10*3/uL — AB (ref 1.4–6.5)
Neutrophils Relative %: 75 %
PLATELETS: 187 10*3/uL (ref 150–440)
RBC: 2.08 MIL/uL — AB (ref 4.40–5.90)
RDW: 16.3 % — ABNORMAL HIGH (ref 11.5–14.5)
WBC: 18.2 10*3/uL — AB (ref 3.8–10.6)

## 2017-07-19 LAB — COMPREHENSIVE METABOLIC PANEL
ALBUMIN: 2.3 g/dL — AB (ref 3.5–5.0)
ALT: 192 U/L — AB (ref 0–44)
AST: 130 U/L — AB (ref 15–41)
Alkaline Phosphatase: 495 U/L — ABNORMAL HIGH (ref 38–126)
Anion gap: 12 (ref 5–15)
BILIRUBIN TOTAL: 0.8 mg/dL (ref 0.3–1.2)
BUN: 31 mg/dL — AB (ref 6–20)
CALCIUM: 7.7 mg/dL — AB (ref 8.9–10.3)
CHLORIDE: 102 mmol/L (ref 98–111)
CO2: 21 mmol/L — ABNORMAL LOW (ref 22–32)
CREATININE: 1.88 mg/dL — AB (ref 0.61–1.24)
GFR calc Af Amer: 46 mL/min — ABNORMAL LOW (ref >60)
GFR calc non Af Amer: 40 mL/min — ABNORMAL LOW (ref >60)
GLUCOSE: 786 mg/dL — AB (ref 70–99)
POTASSIUM: 5 mmol/L (ref 3.5–5.1)
Sodium: 135 mmol/L (ref 135–145)
Total Protein: 4.4 g/dL — ABNORMAL LOW (ref 6.5–8.1)

## 2017-07-19 LAB — TROPONIN I: Troponin I: <0.03 ng/mL (ref <0.03)

## 2017-07-19 LAB — ABO/RH: ABO/RH(D): O POS

## 2017-07-19 LAB — LIPASE, BLOOD: Lipase: 45 U/L (ref 11–51)

## 2017-07-19 LAB — PREPARE RBC (CROSSMATCH)

## 2017-07-19 LAB — GLUCOSE, CAPILLARY: Glucose-Capillary: >600 mg/dL (ref 70–99)

## 2017-07-19 MED ORDER — SODIUM CHLORIDE 0.9 % IV SOLN
1000.0000 mL | Freq: Once | INTRAVENOUS | Status: AC
  Administered 2017-07-19: 1000 mL via INTRAVENOUS

## 2017-07-19 MED ORDER — SODIUM CHLORIDE 0.9 % IV SOLN
10.0000 mL/h | Freq: Once | INTRAVENOUS | Status: AC
  Administered 2017-07-19: 10 mL/h via INTRAVENOUS

## 2017-07-19 MED ORDER — SODIUM CHLORIDE 0.9 % IV SOLN: 1000.0000 mL | Freq: Once | INTRAVENOUS | Status: DC

## 2017-07-19 MED ORDER — SODIUM CHLORIDE 0.9 % IV SOLN
10.0000 mL/h | Freq: Once | INTRAVENOUS | Status: AC
Start: 2017-07-19 — End: 2017-07-19
  Administered 2017-07-19: 10 mL/h via INTRAVENOUS

## 2017-07-19 MED ORDER — PROCHLORPERAZINE EDISYLATE 10 MG/2ML IJ SOLN
5.0000 mg | Freq: Once | INTRAMUSCULAR | Status: AC
Start: 2017-07-19 — End: 2017-07-19
  Administered 2017-07-19: 5 mg via INTRAVENOUS
  Filled 2017-07-19: qty 2

## 2017-07-19 NOTE — ED Notes (Signed)
PT refused urinal or bedpan, Les Medic and [facility] NT took pt to bathroom pressure lowered to 72 systolic, pt was educated that future we will will use bedpan/urinal. Pt is agreeable at this time.

## 2017-07-19 NOTE — ED Notes (Signed)
Labs sent at this time.. Pt is requesting ice chips EDP aware.

## 2017-07-19 NOTE — ED Triage Notes (Signed)
Patient presents to Emergency Department via [facility] with complaints of hypotension EMS initial BP was systolic at 078M noting pt was pale and diaphoretic, EMS gave 1026mls NS IV.  Pt was taking miralax to prep for coloscopy.    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  Pt has port in R upper chest

## 2017-07-19 NOTE — ED Notes (Signed)
Pt was given ice chips at this time.

## 2017-07-19 NOTE — ED Provider Notes (Addendum)
[facility] Emergency Department Provider Note   ____________________________________________    I have reviewed the triage vital signs and the nursing notes.   HISTORY  Chief Complaint Hyperglycemia and Hypotension     HPI [name] [name] is a 52 y.o. male with complicated past medical history significant for Hodgkin lymphoma treated by stem cell transplant, currently only on prednisone, was prepping for colonoscopy on Friday to evaluate GVHD but became too weak and lightheaded with significant diarrhea and some nausea.  Has been feeling weak since then.  Denies fevers or chills.  Oncologist is Dr. Evette Doffing at [facility].  Denies abdominal pain, no bloody stools.  Feels that sugars have gotten out of control because of prednisone   Past Medical History:  Diagnosis Date  . Cancer ([place]) 2015   cervical lymphadenopathy  . Depression   . Diabetes mellitus without complication (HCC)    Pre-diabetes  . Encounter for antineoplastic chemotherapy 03/20/2015  . 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 Headache(784.0)   . 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 Hodgkin lymphoma ([place]) 2015  . Hypertension   . Pain, dental 02/12/2015  . Thyroid disease    Hx: of    Patient Active Problem List   Diagnosis Date Noted  . Nodular sclerosis Hodgkin lymphoma of lymph nodes of multiple regions ([place]) 05/08/2015  . Encounter for antineoplastic chemotherapy 03/20/2015  . CAD (coronary artery disease) 02/25/2015  . Pain, dental 02/12/2015  . Hepatic artery aneurysm ([name])   . Hodgkin's disease ([place]) 04/24/2014  . Abscess 03/23/2014  . Lymphoma ([place]) 03/07/2014  . Abnormal CT scan, chest 09/13/2013  . Shortness of breath 09/13/2013  . Essential hypertension 02/01/2013  . At risk for coronary artery disease 02/01/2013  . Chemotherapy-induced cardiomyopathy ([place]) 02/01/2013  . Diastolic dysfunction 78/29/5621  . Thyroid lesion  01/30/2013  . Family history of cancer 01/28/2013  . Aneurysm artery, hepatic ([place]) 01/24/2013  . DM (diabetes mellitus) ([place]) 01/16/2013  . Hyperlipemia 01/16/2013  . Obesity 01/16/2013  . Hypothyroidism 01/16/2013  . Hodgkin's disease with mixed cellularity ([place]) 01/14/2013  . Cervical lymphadenopathy 12/22/2012    Past Surgical History:  Procedure Laterality Date  . IR GENERIC HISTORICAL  01/01/2016   IR RADIOLOGIST EVAL & MGMT 01/01/2016 [name], MD [facility] INTERV RAD  . MASS EXCISION Left 12/31/2012   Procedure: CERVICAL LYMPH NODE BIOPSY;  Surgeon: [name], MD;  Location: [facility];  Service: General;  Laterality: Left;  . TONSILLECTOMY      Prior to Admission medications   Medication Sig Start Date End Date Taking? Authorizing Provider  aspirin EC 81 MG tablet Take 1 tablet (81 mg total) by mouth daily. 02/23/15   Larey Dresser, MD  Azelastine-Fluticasone [phone] MCG/ACT SUSP Place 1 spray into the nose 2 (two) times daily.    [provider]  Cholecalciferol (D 1000) 1000 units capsule Take 1,000 mg by mouth daily.    [provider]  eszopiclone (LUNESTA) 2 MG TABS tablet Take 2 mg by mouth at bedtime as needed for sleep. Reported on 04/05/2015    [provider]  fenofibrate (TRICOR) 145 MG tablet Take 1 tablet (145 mg total) by mouth daily. 06/12/15   Larey Dresser, MD  insulin glargine (LANTUS) 100 UNIT/ML injection Inject 15 Units into the skin at bedtime.    [provider]  insulin lispro (HUMALOG) 100 UNIT/ML injection Inject into the skin 2 times daily at 12 noon  and 4 pm. Sliding scale for dose    [provider]  levothyroxine (SYNTHROID, LEVOTHROID) 200 MCG tablet Take 200 mcg by mouth daily before breakfast.    [provider]  lidocaine-prilocaine (EMLA) cream Apply 1 application topically as needed. 06/04/15   [name], NP  lovastatin (ALTOPREV) 40 MG 24 hr tablet Take 40 mg by mouth at  bedtime.    [provider]  metFORMIN (GLUCOPHAGE) 500 MG tablet Take 500 mg by mouth 2 (two) times daily. Reported on 07/06/2015 07/28/14   [provider]  omega-3 acid ethyl esters (LOVAZA) 1 g capsule TAKE 2 CAPSULES BY MOUTH TWICE A DAY 09/09/16   Larey Dresser, MD  ondansetron (ZOFRAN) 8 MG tablet Every 12 hours as needed for nausea/vomiting.  Begin on day 3 after chemo. Patient not taking: Reported on 01/01/2016 02/21/15   [name], MD  oxyCODONE (OXY IR/ROXICODONE) 5 MG immediate release tablet Take 1 tablet (5 mg total) by mouth every 6 (six) hours as needed for severe pain. 03/16/14   [name], MD  prochlorperazine (COMPAZINE) 10 MG tablet Take 1 tablet (10 mg total) by mouth every 6 (six) hours as needed for nausea or vomiting. Patient not taking: Reported on 01/01/2016 02/21/15   [name], MD  sertraline (ZOLOFT) 100 MG tablet Take 100 mg by mouth daily with breakfast.     [provider]  sodium fluoride (PREVIDENT 5000 PLUS) 1.1 % CREA dental cream Apply fluoride to tooth brush. Brush teeth for 2 minutes. Spit out excess-DO NOT swallow. Repeat nightly. 04/09/15   [name], DDS     Allergies Patient has no known allergies.  Family History  Problem Relation Age of Onset  . Aneurysm Mother 29       brain  . Obesity Mother   . Heart disease Mother   . AAA (abdominal aortic aneurysm) Mother        dx in her 55s  . Hodgkin's lymphoma Father        dx in his 40s, and again in his 59s  . Diabetes Father   . Cancer Brother        NOS dx in his 68s  . Bone cancer Maternal Uncle 40       don't know if primary or metastises    Social History Social History   Tobacco Use  . Smoking status: Former Smoker    Packs/day: 1.00    Years: 31.00    Pack years: 31.00    Types: Cigarettes    Last attempt to quit: 06/07/2012    Years since quitting: 5.1  . Smokeless tobacco: Never Used  Substance Use Topics  . Alcohol use: No    . Drug use: No    Review of Systems  Constitutional: No fever/chills Eyes: No visual changes.  ENT: No sore throat. Cardiovascular: Denies chest pain. Respiratory: Denies shortness of breath.  No cough Gastrointestinal: Significant diarrhea, positive nausea Genitourinary: Negative for dysuria. Musculoskeletal: Negative for joint swelling Skin: Negative for rash. Neurological: Negative for headaches    ____________________________________________   PHYSICAL EXAM:  VITAL SIGNS: ED Triage Vitals  Enc Vitals Group     BP 07/19/17 0712 119/81     Pulse Rate 07/19/17 0712 87     Resp 07/19/17 0712 18     Temp 07/19/17 0712 97.6 F (36.4 C)     Temp Source 07/19/17 0712 Oral     SpO2 07/19/17 0658 100 %  Weight 07/19/17 0712 77.1 kg (170 lb)     Height 07/19/17 0712 1.778 m (5\' 10" )     Head Circumference --      Peak Flow --      Pain Score 07/19/17 0712 0     Pain Loc --      Pain Edu? --      Excl. in [place]? --     Constitutional: Alert and oriented.   Eyes: Conjunctivae are normal.  Head: Small abrasion inferolateral to the left eye, no bony abnormalities Nose: No congestion/rhinnorhea. Mouth/Throat: Mucous membranes are dry Neck:  Painless ROM Cardiovascular: Normal rate, regular rhythm. Grossly normal heart sounds.  Good peripheral circulation. Respiratory: Normal respiratory effort.  No retractions. Lungs CTAB. GI: No abdominal tenderness to palpation, no distention Musculoskeletal: No lower extremity tenderness nor edema.  Warm and well perfused Neurologic:  Normal speech and language. No gross focal neurologic deficits are appreciated.  Skin:  Skin is warm, dry and intact.  Psychiatric: Mood and affect are normal. Speech and behavior are normal.  ____________________________________________   LABS (all labs ordered are listed, but only abnormal results are displayed)  Labs Reviewed  GLUCOSE, CAPILLARY - Abnormal; Notable for the following components:       Result Value   Glucose-Capillary >600 (*)    All other components within normal limits  GLUCOSE, CAPILLARY - Abnormal; Notable for the following components:   Glucose-Capillary >600 (*)    All other components within normal limits  CBC WITH DIFFERENTIAL/PLATELET - Abnormal; Notable for the following components:   WBC 18.2 (*)    RBC 2.08 (*)    Hemoglobin 7.4 (*)    HCT 22.1 (*)    MCV 106.5 (*)    MCH 35.5 (*)    RDW 16.3 (*)    Neutro Abs 13.8 (*)    Monocytes Absolute 1.2 (*)    All other components within normal limits  COMPREHENSIVE METABOLIC PANEL - Abnormal; Notable for the following components:   CO2 21 (*)    Glucose, Bld 786 (*)    BUN 31 (*)    Creatinine, Ser 1.88 (*)    Calcium 7.7 (*)    Total Protein 4.4 (*)    Albumin 2.3 (*)    AST 130 (*)    ALT 192 (*)    Alkaline Phosphatase 495 (*)    GFR calc non Af Amer 40 (*)    GFR calc Af Amer 46 (*)    All other components within normal limits  LIPASE, BLOOD  TROPONIN I  TYPE AND SCREEN  PREPARE RBC (CROSSMATCH)  ABO/RH  PREPARE RBC (CROSSMATCH)   ____________________________________________  EKG  ED ECG REPORT I, Lavonia Drafts, the attending physician, personally viewed and interpreted this ECG.  Date: 07/19/2017  Rhythm: normal sinus rhythm QRS Axis: normal Intervals: normal ST/T Wave abnormalities: Nonspecific changes Narrative Interpretation: no evidence of acute ischemia  ____________________________________________  RADIOLOGY  None ____________________________________________   PROCEDURES  Procedure(s) performed: No  Procedures   Critical Care performed: yes  CRITICAL CARE Performed by: Lavonia Drafts   Total critical care time:40 minutes  Critical care time was exclusive of separately billable procedures and treating other patients.  Critical care was necessary to treat or prevent imminent or life-threatening deterioration.  Critical care was time spent personally  by me on the following activities: development of treatment plan with patient and/or surrogate as well as nursing, discussions with consultants, evaluation of patient's response to treatment, examination of patient,  obtaining history from patient or surrogate, ordering and performing treatments and interventions, ordering and review of laboratory studies, ordering and review of radiographic studies, pulse oximetry and re-evaluation of patient's condition.  ____________________________________________   INITIAL IMPRESSION / ASSESSMENT AND PLAN / ED COURSE  Pertinent labs & imaging results that were available during my care of the patient were reviewed by me and considered in my medical decision making (see chart for details).  Patient presents with syncopal/weakness/hyperglycemia after colonoscopy prep in the setting of post SCT, on prednisone, prophylactic antifungals, antibiotics.  Likely dehydration caused by diarrhea from colonoscopy prep with elevated glucose from prednisone.  Will start with IV fluids, check labs including electrolytes, CBC with differential  Patient with glucose of 786 with normal anion gap.  Elevated BUN and creatinine as well.  However hemoglobin is 7.4, 6 days ago his hemoglobin was 15 at outpatient appointment.  On rectal exam dark red blood consistent with GI bleed.  Patient has consented for transfusion. Discussed with Dr. Janese Banks of oncology, recommends irradiated prbc, confirmed this with blood bank  Will [facility] as this is where patient receives his oncological care.   ----------------------------------------- 8:28 AM on 07/19/2017 -----------------------------------------  Discussed with fellow at [facility], they accept for transfer under attending Dr. Ok Edwards.  Contacted again by [facility], they have decided to place the patient in stepdown accepting doctor is Dr. Caryn Section   ----------------------------------------- 10:52 AM on  07/19/2017 -----------------------------------------  Reevaluated the patient, he is feeling significantly better.  Isolated low blood pressure appears to be positional, just finished first PRBC, will give an additional liter of IV fluid, patient will be transported by EMS as [facility] does not have any critical care transport trucks available    ____________________________________________   FINAL CLINICAL IMPRESSION(S) / ED DIAGNOSES  Final diagnoses:  AKI (acute kidney injury) ([place])  Gastrointestinal hemorrhage, unspecified gastrointestinal hemorrhage type  Hyperglycemia        Note:  This document was prepared using Dragon voice recognition software and may include unintentional dictation errors.    Lavonia Drafts, MD 07/19/17 1102    Lavonia Drafts, MD 07/19/17 1052

## 2017-07-19 NOTE — ED Notes (Signed)
EMTALA reviewed by charge RN 

## 2017-07-19 NOTE — ED Notes (Signed)
Type and screen sent at this time.  

## 2017-07-19 NOTE — ED Notes (Signed)
.   Pt is resting, Respirations even and unlabored, NAD. Stretcher lowest postion and locked. Call bell within reach. Denies any needs at this time RN will continue to monitor.  Awaiting transport

## 2017-07-20 DIAGNOSIS — K922 Gastrointestinal hemorrhage, unspecified: Principal | ICD-10-CM

## 2017-07-20 LAB — GLUCOSE, CAPILLARY: Glucose-Capillary: >600 mg/dL (ref 70–99)

## 2017-07-20 LAB — BPAM RBC
Blood Product Expiration Date: 201907162359
ISSUE DATE / TIME: 201907070919
Unit Type and Rh: 5100

## 2017-07-20 LAB — TYPE AND SCREEN
ABO/RH(D): O POS
ANTIBODY SCREEN: NEGATIVE
UNIT DIVISION: 0

## 2017-07-20 LAB — PREPARE RBC (CROSSMATCH)

## 2017-07-22 DIAGNOSIS — K922 Gastrointestinal hemorrhage, unspecified: Principal | ICD-10-CM

## 2017-07-23 MED ORDER — PANTOPRAZOLE 40 MG TABLET,DELAYED RELEASE
ORAL_TABLET | Freq: Two times a day (BID) | ORAL | 5 refills | 0.00000 days | Status: CP
Start: 2017-07-23 — End: 2018-08-30

## 2017-07-23 MED ORDER — CYCLOSPORINE 0.05 % EYE DROPS IN A DROPPERETTE
Freq: Two times a day (BID) | OPHTHALMIC | 2 refills | 0.00000 days | Status: CP
Start: 2017-07-23 — End: 2017-11-18

## 2017-07-23 MED ORDER — PREDNISONE 20 MG TABLET
ORAL_TABLET | Freq: Every day | ORAL | 3 refills | 0 days
Start: 2017-07-23 — End: 2017-08-05

## 2017-07-27 ENCOUNTER — Other Ambulatory Visit: Admit: 2017-07-27 | Discharge: 2017-07-27 | Payer: PRIVATE HEALTH INSURANCE

## 2017-07-27 ENCOUNTER — Ambulatory Visit: Admit: 2017-07-27 | Discharge: 2017-07-27 | Payer: PRIVATE HEALTH INSURANCE

## 2017-07-27 ENCOUNTER — Ambulatory Visit: Admit: 2017-07-27 | Discharge: 2017-07-27 | Payer: PRIVATE HEALTH INSURANCE | Attending: Oncology | Primary: Oncology

## 2017-07-27 DIAGNOSIS — Z9484 Stem cells transplant status: Secondary | ICD-10-CM

## 2017-07-27 DIAGNOSIS — Z9481 Bone marrow transplant status: Principal | ICD-10-CM

## 2017-07-27 MED ORDER — INSULIN LISPRO (U-100) 100 UNIT/ML SUBCUTANEOUS PEN
Freq: Three times a day (TID) | SUBCUTANEOUS | 5 refills | 0.00000 days | Status: CP
Start: 2017-07-27 — End: 2017-08-05

## 2017-07-27 MED ORDER — INSULIN LISPRO (U-100) 100 UNIT/ML SUBCUTANEOUS PEN: 6 [IU] | mL | Freq: Three times a day (TID) | 5 refills | 0 days | Status: AC

## 2017-07-27 MED ORDER — INSULIN GLARGINE (U-100) 100 UNIT/ML SUBCUTANEOUS SOLUTION
Freq: Every evening | SUBCUTANEOUS | 2 refills | 0.00000 days | Status: CP
Start: 2017-07-27 — End: 2017-08-10

## 2017-07-29 MED ORDER — INSULIN LISPRO (U-100) 100 UNIT/ML SUBCUTANEOUS PEN
5 refills | 0 days | Status: CP
Start: 2017-07-29 — End: 2017-08-12

## 2017-08-05 ENCOUNTER — Ambulatory Visit: Admit: 2017-08-05 | Discharge: 2017-08-06 | Payer: PRIVATE HEALTH INSURANCE

## 2017-08-05 ENCOUNTER — Other Ambulatory Visit: Admit: 2017-08-05 | Discharge: 2017-08-06 | Payer: PRIVATE HEALTH INSURANCE

## 2017-08-05 ENCOUNTER — Ambulatory Visit: Admit: 2017-08-05 | Discharge: 2017-08-06 | Payer: PRIVATE HEALTH INSURANCE | Attending: Oncology | Primary: Oncology

## 2017-08-05 ENCOUNTER — Ambulatory Visit
Admit: 2017-08-05 | Discharge: 2017-08-06 | Payer: PRIVATE HEALTH INSURANCE | Attending: Nurse Practitioner | Primary: Nurse Practitioner

## 2017-08-05 DIAGNOSIS — D899 Disorder involving the immune mechanism, unspecified: Secondary | ICD-10-CM

## 2017-08-05 DIAGNOSIS — C8120 Mixed cellularity classical Hodgkin lymphoma, unspecified site: Principal | ICD-10-CM

## 2017-08-05 DIAGNOSIS — R350 Frequency of micturition: Secondary | ICD-10-CM

## 2017-08-05 DIAGNOSIS — Z9481 Bone marrow transplant status: Principal | ICD-10-CM

## 2017-08-05 DIAGNOSIS — E119 Type 2 diabetes mellitus without complications: Secondary | ICD-10-CM

## 2017-08-05 DIAGNOSIS — Z9484 Stem cells transplant status: Secondary | ICD-10-CM

## 2017-08-05 DIAGNOSIS — C8190 Hodgkin lymphoma, unspecified, unspecified site: Principal | ICD-10-CM

## 2017-08-05 DIAGNOSIS — Z48298 Encounter for aftercare following other organ transplant: Principal | ICD-10-CM

## 2017-08-05 MED ORDER — INSULIN LISPRO (U-100) 100 UNIT/ML SUBCUTANEOUS PEN
Freq: Three times a day (TID) | SUBCUTANEOUS | 5 refills | 0 days
Start: 2017-08-05 — End: 2017-08-12

## 2017-08-05 MED ORDER — PREDNISONE 20 MG TABLET
ORAL_TABLET | Freq: Every day | ORAL | 3 refills | 0 days
Start: 2017-08-05 — End: 2017-09-01

## 2017-08-10 ENCOUNTER — Other Ambulatory Visit: Admit: 2017-08-10 | Discharge: 2017-08-10 | Payer: PRIVATE HEALTH INSURANCE

## 2017-08-10 ENCOUNTER — Ambulatory Visit: Admit: 2017-08-10 | Discharge: 2017-08-10 | Payer: PRIVATE HEALTH INSURANCE | Attending: Oncology | Primary: Oncology

## 2017-08-10 ENCOUNTER — Ambulatory Visit: Admit: 2017-08-10 | Discharge: 2017-08-10 | Payer: PRIVATE HEALTH INSURANCE

## 2017-08-10 DIAGNOSIS — Z9484 Stem cells transplant status: Secondary | ICD-10-CM

## 2017-08-10 DIAGNOSIS — E039 Hypothyroidism, unspecified: Principal | ICD-10-CM

## 2017-08-10 DIAGNOSIS — Z9481 Bone marrow transplant status: Principal | ICD-10-CM

## 2017-08-10 DIAGNOSIS — E119 Type 2 diabetes mellitus without complications: Secondary | ICD-10-CM

## 2017-08-10 MED ORDER — INSULIN GLARGINE (U-100) 100 UNIT/ML SUBCUTANEOUS SOLUTION: 50 [IU] | mL | Freq: Every evening | 2 refills | 0 days | Status: AC

## 2017-08-10 MED ORDER — INSULIN GLARGINE (U-100) 100 UNIT/ML SUBCUTANEOUS SOLUTION
Freq: Every evening | SUBCUTANEOUS | 6 refills | 0.00000 days | Status: CP
Start: 2017-08-10 — End: 2017-09-09

## 2017-08-10 MED ORDER — METFORMIN 500 MG TABLET
ORAL_TABLET | Freq: Two times a day (BID) | ORAL | 11 refills | 0.00000 days | Status: CP
Start: 2017-08-10 — End: 2018-08-10

## 2017-08-12 ENCOUNTER — Ambulatory Visit
Admit: 2017-08-12 | Discharge: 2017-08-12 | Payer: PRIVATE HEALTH INSURANCE | Attending: Hematology & Oncology | Primary: Hematology & Oncology

## 2017-08-12 ENCOUNTER — Other Ambulatory Visit: Admit: 2017-08-12 | Discharge: 2017-08-12 | Payer: PRIVATE HEALTH INSURANCE

## 2017-08-12 ENCOUNTER — Ambulatory Visit: Admit: 2017-08-12 | Discharge: 2017-08-12 | Payer: PRIVATE HEALTH INSURANCE | Attending: Oncology | Primary: Oncology

## 2017-08-12 ENCOUNTER — Ambulatory Visit: Admit: 2017-08-12 | Discharge: 2017-08-12 | Payer: PRIVATE HEALTH INSURANCE

## 2017-08-12 DIAGNOSIS — C8120 Mixed cellularity classical Hodgkin lymphoma, unspecified site: Principal | ICD-10-CM

## 2017-08-12 DIAGNOSIS — D89813 Graft-versus-host disease, unspecified: Secondary | ICD-10-CM

## 2017-08-12 DIAGNOSIS — Z9484 Stem cells transplant status: Secondary | ICD-10-CM

## 2017-08-12 DIAGNOSIS — Z9481 Bone marrow transplant status: Secondary | ICD-10-CM

## 2017-08-12 DIAGNOSIS — D899 Disorder involving the immune mechanism, unspecified: Secondary | ICD-10-CM

## 2017-08-12 MED ORDER — RUXOLITINIB 5 MG TABLET
ORAL_TABLET | Freq: Two times a day (BID) | ORAL | 5 refills | 0.00000 days | Status: CP
Start: 2017-08-12 — End: 2017-08-17

## 2017-08-12 MED ORDER — INSULIN LISPRO (U-100) 100 UNIT/ML SUBCUTANEOUS PEN
5 refills | 0 days
Start: 2017-08-12 — End: 2017-10-05

## 2017-08-17 ENCOUNTER — Ambulatory Visit: Admit: 2017-08-17 | Discharge: 2017-08-18 | Payer: PRIVATE HEALTH INSURANCE

## 2017-08-17 ENCOUNTER — Other Ambulatory Visit: Admit: 2017-08-17 | Discharge: 2017-08-18 | Payer: PRIVATE HEALTH INSURANCE

## 2017-08-17 ENCOUNTER — Ambulatory Visit: Admit: 2017-08-17 | Discharge: 2017-08-18 | Payer: PRIVATE HEALTH INSURANCE | Attending: Oncology | Primary: Oncology

## 2017-08-17 DIAGNOSIS — D89813 Graft-versus-host disease, unspecified: Secondary | ICD-10-CM

## 2017-08-17 DIAGNOSIS — Z9484 Stem cells transplant status: Principal | ICD-10-CM

## 2017-08-17 DIAGNOSIS — E119 Type 2 diabetes mellitus without complications: Principal | ICD-10-CM

## 2017-08-17 DIAGNOSIS — Z794 Long term (current) use of insulin: Secondary | ICD-10-CM

## 2017-08-17 DIAGNOSIS — Z9481 Bone marrow transplant status: Principal | ICD-10-CM

## 2017-08-17 DIAGNOSIS — T8609 Other complications of bone marrow transplant: Secondary | ICD-10-CM

## 2017-08-17 MED ORDER — PREDNISOLONE ACETATE 1 % EYE DROPS,SUSPENSION
1 refills | 0 days | Status: CP
Start: 2017-08-17 — End: 2018-07-07

## 2017-08-17 MED ORDER — RUXOLITINIB 5 MG TABLET
ORAL_TABLET | Freq: Two times a day (BID) | ORAL | 5 refills | 0 days | Status: CP
Start: 2017-08-17 — End: 2017-09-16

## 2017-08-18 ENCOUNTER — Ambulatory Visit: Admit: 2017-08-18 | Discharge: 2017-08-18 | Payer: PRIVATE HEALTH INSURANCE

## 2017-08-18 DIAGNOSIS — Z9484 Stem cells transplant status: Secondary | ICD-10-CM

## 2017-08-18 DIAGNOSIS — D89813 Graft-versus-host disease, unspecified: Secondary | ICD-10-CM

## 2017-08-18 DIAGNOSIS — T8609 Other complications of bone marrow transplant: Principal | ICD-10-CM

## 2017-08-26 ENCOUNTER — Ambulatory Visit: Admit: 2017-08-26 | Discharge: 2017-08-26 | Payer: PRIVATE HEALTH INSURANCE | Attending: Oncology | Primary: Oncology

## 2017-08-26 ENCOUNTER — Ambulatory Visit
Admit: 2017-08-26 | Discharge: 2017-08-26 | Payer: PRIVATE HEALTH INSURANCE | Attending: Primary Care | Primary: Primary Care

## 2017-08-26 ENCOUNTER — Other Ambulatory Visit: Admit: 2017-08-26 | Discharge: 2017-08-26 | Payer: PRIVATE HEALTH INSURANCE

## 2017-08-26 DIAGNOSIS — E119 Type 2 diabetes mellitus without complications: Secondary | ICD-10-CM

## 2017-08-26 DIAGNOSIS — Z9481 Bone marrow transplant status: Secondary | ICD-10-CM

## 2017-08-26 DIAGNOSIS — D89813 Graft-versus-host disease, unspecified: Principal | ICD-10-CM

## 2017-08-26 DIAGNOSIS — Z9484 Stem cells transplant status: Secondary | ICD-10-CM

## 2017-08-26 DIAGNOSIS — R748 Abnormal levels of other serum enzymes: Secondary | ICD-10-CM

## 2017-08-26 DIAGNOSIS — R17 Unspecified jaundice: Secondary | ICD-10-CM

## 2017-08-26 DIAGNOSIS — C8120 Mixed cellularity classical Hodgkin lymphoma, unspecified site: Secondary | ICD-10-CM

## 2017-08-26 DIAGNOSIS — Z794 Long term (current) use of insulin: Secondary | ICD-10-CM

## 2017-08-26 MED ORDER — TACROLIMUS 0.5 MG CAPSULE
ORAL_CAPSULE | Freq: Two times a day (BID) | ORAL | 0 refills | 0.00000 days | Status: CP
Start: 2017-08-26 — End: 2017-09-09

## 2017-08-26 MED ORDER — ISAVUCONAZONIUM SULFATE 186 MG CAPSULE
Freq: Every day | ORAL | 5 refills | 0.00000 days | Status: CP
Start: 2017-08-26 — End: 2018-08-30

## 2017-09-01 ENCOUNTER — Ambulatory Visit: Admit: 2017-09-01 | Discharge: 2017-09-01 | Payer: PRIVATE HEALTH INSURANCE

## 2017-09-01 ENCOUNTER — Ambulatory Visit
Admit: 2017-09-01 | Discharge: 2017-09-01 | Payer: PRIVATE HEALTH INSURANCE | Attending: Pharmacist Clinician (PhC)/ Clinical Pharmacy Specialist | Primary: Pharmacist Clinician (PhC)/ Clinical Pharmacy Specialist

## 2017-09-01 ENCOUNTER — Other Ambulatory Visit: Admit: 2017-09-01 | Discharge: 2017-09-01 | Payer: PRIVATE HEALTH INSURANCE

## 2017-09-01 DIAGNOSIS — Z9481 Bone marrow transplant status: Secondary | ICD-10-CM

## 2017-09-01 DIAGNOSIS — Z9484 Stem cells transplant status: Principal | ICD-10-CM

## 2017-09-01 DIAGNOSIS — D89813 Graft-versus-host disease, unspecified: Secondary | ICD-10-CM

## 2017-09-01 DIAGNOSIS — M8580 Other specified disorders of bone density and structure, unspecified site: Secondary | ICD-10-CM

## 2017-09-01 MED ORDER — ROSUVASTATIN 20 MG TABLET
ORAL_TABLET | Freq: Every evening | ORAL | 5 refills | 0 days | Status: CP
Start: 2017-09-01 — End: 2018-07-07

## 2017-09-02 MED ORDER — PREDNISONE 20 MG TABLET
ORAL_TABLET | Freq: Every day | ORAL | 3 refills | 0 days
Start: 2017-09-02 — End: 2017-09-09

## 2017-09-09 ENCOUNTER — Ambulatory Visit: Admit: 2017-09-09 | Discharge: 2017-09-09 | Payer: PRIVATE HEALTH INSURANCE

## 2017-09-09 ENCOUNTER — Other Ambulatory Visit: Admit: 2017-09-09 | Discharge: 2017-09-09 | Payer: PRIVATE HEALTH INSURANCE

## 2017-09-09 ENCOUNTER — Ambulatory Visit
Admit: 2017-09-09 | Discharge: 2017-09-09 | Payer: PRIVATE HEALTH INSURANCE | Attending: Primary Care | Primary: Primary Care

## 2017-09-09 DIAGNOSIS — Z9481 Bone marrow transplant status: Secondary | ICD-10-CM

## 2017-09-09 DIAGNOSIS — C8120 Mixed cellularity classical Hodgkin lymphoma, unspecified site: Principal | ICD-10-CM

## 2017-09-09 DIAGNOSIS — M8580 Other specified disorders of bone density and structure, unspecified site: Secondary | ICD-10-CM

## 2017-09-09 DIAGNOSIS — Z9484 Stem cells transplant status: Secondary | ICD-10-CM

## 2017-09-09 DIAGNOSIS — D89813 Graft-versus-host disease, unspecified: Principal | ICD-10-CM

## 2017-09-09 DIAGNOSIS — I10 Essential (primary) hypertension: Secondary | ICD-10-CM

## 2017-09-09 DIAGNOSIS — Z794 Long term (current) use of insulin: Secondary | ICD-10-CM

## 2017-09-09 DIAGNOSIS — H16223 Keratoconjunctivitis sicca, not specified as Sjogren's, bilateral: Secondary | ICD-10-CM

## 2017-09-09 DIAGNOSIS — E119 Type 2 diabetes mellitus without complications: Secondary | ICD-10-CM

## 2017-09-09 MED ORDER — INSULIN GLARGINE (U-100) 100 UNIT/ML SUBCUTANEOUS SOLUTION
Freq: Every evening | SUBCUTANEOUS | 6 refills | 0.00000 days
Start: 2017-09-09 — End: 2017-09-30

## 2017-09-09 MED ORDER — AMLODIPINE 5 MG TABLET
ORAL_TABLET | Freq: Every day | ORAL | 11 refills | 0 days | Status: CP
Start: 2017-09-09 — End: 2018-09-09

## 2017-09-09 MED ORDER — PREDNISONE 10 MG TABLET
Freq: Every day | ORAL | 0 refills | 0.00000 days
Start: 2017-09-09 — End: 2017-09-30

## 2017-09-09 MED ORDER — TACROLIMUS 0.5 MG CAPSULE
ORAL_CAPSULE | 0 refills | 0 days
Start: 2017-09-09 — End: 2017-10-16

## 2017-09-16 ENCOUNTER — Other Ambulatory Visit: Admit: 2017-09-16 | Discharge: 2017-09-17 | Payer: PRIVATE HEALTH INSURANCE

## 2017-09-16 ENCOUNTER — Ambulatory Visit
Admit: 2017-09-16 | Discharge: 2017-09-17 | Payer: PRIVATE HEALTH INSURANCE | Attending: Primary Care | Primary: Primary Care

## 2017-09-16 DIAGNOSIS — D89813 Graft-versus-host disease, unspecified: Secondary | ICD-10-CM

## 2017-09-16 DIAGNOSIS — M8580 Other specified disorders of bone density and structure, unspecified site: Secondary | ICD-10-CM

## 2017-09-16 DIAGNOSIS — Z9481 Bone marrow transplant status: Principal | ICD-10-CM

## 2017-09-16 DIAGNOSIS — C8120 Mixed cellularity classical Hodgkin lymphoma, unspecified site: Secondary | ICD-10-CM

## 2017-09-16 DIAGNOSIS — Z794 Long term (current) use of insulin: Secondary | ICD-10-CM

## 2017-09-16 DIAGNOSIS — D899 Disorder involving the immune mechanism, unspecified: Secondary | ICD-10-CM

## 2017-09-16 DIAGNOSIS — E119 Type 2 diabetes mellitus without complications: Secondary | ICD-10-CM

## 2017-09-16 DIAGNOSIS — Z9484 Stem cells transplant status: Secondary | ICD-10-CM

## 2017-09-16 MED ORDER — RUXOLITINIB 5 MG TABLET
ORAL_TABLET | Freq: Two times a day (BID) | ORAL | 6 refills | 0 days | Status: CP
Start: 2017-09-16 — End: 2017-11-04

## 2017-09-17 MED ORDER — SERTRALINE 100 MG TABLET
ORAL_TABLET | 11 refills | 0 days | Status: CP
Start: 2017-09-17 — End: ?

## 2017-09-21 MED ORDER — VALACYCLOVIR 500 MG TABLET
ORAL_TABLET | 11 refills | 0 days | Status: CP
Start: 2017-09-21 — End: ?

## 2017-09-30 ENCOUNTER — Ambulatory Visit: Admit: 2017-09-30 | Discharge: 2017-09-30 | Payer: PRIVATE HEALTH INSURANCE | Attending: Oncology | Primary: Oncology

## 2017-09-30 ENCOUNTER — Ambulatory Visit
Admit: 2017-09-30 | Discharge: 2017-09-30 | Payer: PRIVATE HEALTH INSURANCE | Attending: Internal Medicine | Primary: Internal Medicine

## 2017-09-30 ENCOUNTER — Other Ambulatory Visit: Admit: 2017-09-30 | Discharge: 2017-09-30 | Payer: PRIVATE HEALTH INSURANCE

## 2017-09-30 ENCOUNTER — Ambulatory Visit: Admit: 2017-09-30 | Discharge: 2017-09-30 | Payer: PRIVATE HEALTH INSURANCE

## 2017-09-30 DIAGNOSIS — H16223 Keratoconjunctivitis sicca, not specified as Sjogren's, bilateral: Secondary | ICD-10-CM

## 2017-09-30 DIAGNOSIS — D89813 Graft-versus-host disease, unspecified: Secondary | ICD-10-CM

## 2017-09-30 DIAGNOSIS — Z9289 Personal history of other medical treatment: Secondary | ICD-10-CM

## 2017-09-30 DIAGNOSIS — D649 Anemia, unspecified: Secondary | ICD-10-CM

## 2017-09-30 DIAGNOSIS — E039 Hypothyroidism, unspecified: Principal | ICD-10-CM

## 2017-09-30 DIAGNOSIS — C8120 Mixed cellularity classical Hodgkin lymphoma, unspecified site: Secondary | ICD-10-CM

## 2017-09-30 DIAGNOSIS — Z794 Long term (current) use of insulin: Secondary | ICD-10-CM

## 2017-09-30 DIAGNOSIS — Z9481 Bone marrow transplant status: Secondary | ICD-10-CM

## 2017-09-30 DIAGNOSIS — R632 Polyphagia: Secondary | ICD-10-CM

## 2017-09-30 DIAGNOSIS — E119 Type 2 diabetes mellitus without complications: Secondary | ICD-10-CM

## 2017-09-30 DIAGNOSIS — Z9484 Stem cells transplant status: Secondary | ICD-10-CM

## 2017-09-30 DIAGNOSIS — T8699 Other complications of unspecified transplanted organ and tissue: Secondary | ICD-10-CM

## 2017-09-30 DIAGNOSIS — F329 Major depressive disorder, single episode, unspecified: Secondary | ICD-10-CM

## 2017-09-30 DIAGNOSIS — T8609 Other complications of bone marrow transplant: Principal | ICD-10-CM

## 2017-09-30 MED ORDER — SULFAMETHOXAZOLE 800 MG-TRIMETHOPRIM 160 MG TABLET
ORAL_TABLET | ORAL | 2 refills | 0.00000 days | Status: CP
Start: 2017-09-30 — End: 2018-07-07

## 2017-09-30 MED ORDER — INSULIN GLARGINE (U-100) 100 UNIT/ML SUBCUTANEOUS SOLUTION
Freq: Every evening | SUBCUTANEOUS | 6 refills | 0.00000 days
Start: 2017-09-30 — End: 2017-10-28

## 2017-09-30 MED ORDER — INSULIN GLARGINE (U-100) 100 UNIT/ML SUBCUTANEOUS SOLUTION: 40 [IU] | mL | Freq: Every evening | 6 refills | 0 days

## 2017-09-30 MED ORDER — PREDNISONE 10 MG TABLET
Freq: Every day | ORAL | 0 refills | 0 days
Start: 2017-09-30 — End: 2017-10-07

## 2017-10-05 MED ORDER — INSULIN LISPRO (U-100) 100 UNIT/ML SUBCUTANEOUS PEN
2 refills | 0 days | Status: CP
Start: 2017-10-05 — End: 2017-10-28

## 2017-10-07 ENCOUNTER — Other Ambulatory Visit: Admit: 2017-10-07 | Discharge: 2017-10-07 | Payer: PRIVATE HEALTH INSURANCE

## 2017-10-07 ENCOUNTER — Ambulatory Visit
Admit: 2017-10-07 | Discharge: 2017-10-07 | Payer: PRIVATE HEALTH INSURANCE | Attending: Primary Care | Primary: Primary Care

## 2017-10-07 ENCOUNTER — Ambulatory Visit: Admit: 2017-10-07 | Discharge: 2017-10-07 | Payer: PRIVATE HEALTH INSURANCE

## 2017-10-07 DIAGNOSIS — T8609 Other complications of bone marrow transplant: Secondary | ICD-10-CM

## 2017-10-07 DIAGNOSIS — Z9481 Bone marrow transplant status: Principal | ICD-10-CM

## 2017-10-07 DIAGNOSIS — D89811 Chronic graft-versus-host disease: Secondary | ICD-10-CM

## 2017-10-07 DIAGNOSIS — E785 Hyperlipidemia, unspecified: Principal | ICD-10-CM

## 2017-10-07 DIAGNOSIS — D89813 Graft-versus-host disease, unspecified: Secondary | ICD-10-CM

## 2017-10-07 DIAGNOSIS — Z9484 Stem cells transplant status: Secondary | ICD-10-CM

## 2017-10-07 DIAGNOSIS — E119 Type 2 diabetes mellitus without complications: Secondary | ICD-10-CM

## 2017-10-07 DIAGNOSIS — Z794 Long term (current) use of insulin: Secondary | ICD-10-CM

## 2017-10-07 MED ORDER — VALACYCLOVIR 500 MG TABLET
ORAL_TABLET | Freq: Every day | ORAL | 11 refills | 0 days | Status: CP
Start: 2017-10-07 — End: 2017-10-29

## 2017-10-07 MED ORDER — PREDNISONE 10 MG TABLET
Freq: Every day | ORAL | 0 refills | 0 days
Start: 2017-10-07 — End: 2017-11-04

## 2017-10-07 MED ORDER — CEFDINIR 300 MG CAPSULE
ORAL_CAPSULE | Freq: Two times a day (BID) | ORAL | 3 refills | 0.00000 days | Status: CP
Start: 2017-10-07 — End: 2017-11-18

## 2017-10-14 ENCOUNTER — Other Ambulatory Visit: Admit: 2017-10-14 | Discharge: 2017-10-15 | Payer: PRIVATE HEALTH INSURANCE

## 2017-10-14 ENCOUNTER — Ambulatory Visit
Admit: 2017-10-14 | Discharge: 2017-10-15 | Payer: PRIVATE HEALTH INSURANCE | Attending: Hematology & Oncology | Primary: Hematology & Oncology

## 2017-10-14 DIAGNOSIS — Z9481 Bone marrow transplant status: Principal | ICD-10-CM

## 2017-10-14 DIAGNOSIS — D89811 Chronic graft-versus-host disease: Secondary | ICD-10-CM

## 2017-10-14 DIAGNOSIS — Z9484 Stem cells transplant status: Secondary | ICD-10-CM

## 2017-10-14 DIAGNOSIS — C8190 Hodgkin lymphoma, unspecified, unspecified site: Secondary | ICD-10-CM

## 2017-10-16 MED ORDER — TACROLIMUS 0.5 MG CAPSULE
ORAL_CAPSULE | 6 refills | 0 days | Status: CP
Start: 2017-10-16 — End: 2017-10-21

## 2017-10-25 MED ORDER — TACROLIMUS 0.5 MG CAPSULE
ORAL_CAPSULE | 6 refills | 0 days | Status: CP
Start: 2017-10-25 — End: 2017-11-18

## 2017-10-28 ENCOUNTER — Other Ambulatory Visit: Admit: 2017-10-28 | Discharge: 2017-10-29 | Payer: PRIVATE HEALTH INSURANCE

## 2017-10-28 ENCOUNTER — Ambulatory Visit
Admit: 2017-10-28 | Discharge: 2017-10-29 | Payer: PRIVATE HEALTH INSURANCE | Attending: Hematology & Oncology | Primary: Hematology & Oncology

## 2017-10-28 ENCOUNTER — Ambulatory Visit: Admit: 2017-10-28 | Discharge: 2017-10-29 | Payer: PRIVATE HEALTH INSURANCE | Attending: Oncology | Primary: Oncology

## 2017-10-28 DIAGNOSIS — Z9481 Bone marrow transplant status: Principal | ICD-10-CM

## 2017-10-28 DIAGNOSIS — Z9484 Stem cells transplant status: Secondary | ICD-10-CM

## 2017-10-28 DIAGNOSIS — Z794 Long term (current) use of insulin: Secondary | ICD-10-CM

## 2017-10-28 DIAGNOSIS — E119 Type 2 diabetes mellitus without complications: Secondary | ICD-10-CM

## 2017-10-28 DIAGNOSIS — D89811 Chronic graft-versus-host disease: Principal | ICD-10-CM

## 2017-10-28 MED ORDER — RIFAXIMIN 550 MG TABLET
ORAL_TABLET | Freq: Three times a day (TID) | ORAL | 0 refills | 0 days | Status: CP
Start: 2017-10-28 — End: 2017-11-11

## 2017-10-28 MED ORDER — INSULIN GLARGINE (U-100) 100 UNIT/ML SUBCUTANEOUS SOLUTION
6 refills | 0 days
Start: 2017-10-28 — End: 2017-10-29

## 2017-10-28 MED ORDER — INSULIN LISPRO (U-100) 100 UNIT/ML SUBCUTANEOUS PEN
2 refills | 0 days
Start: 2017-10-28 — End: 2017-11-04

## 2017-10-29 MED ORDER — INSULIN GLARGINE (U-100) 100 UNIT/ML SUBCUTANEOUS SOLUTION
6 refills | 0 days
Start: 2017-10-29 — End: 2017-11-04

## 2017-11-04 ENCOUNTER — Other Ambulatory Visit: Admit: 2017-11-04 | Discharge: 2017-11-04 | Payer: PRIVATE HEALTH INSURANCE

## 2017-11-04 ENCOUNTER — Ambulatory Visit
Admit: 2017-11-04 | Discharge: 2017-11-04 | Payer: PRIVATE HEALTH INSURANCE | Attending: Pharmacist Clinician (PhC)/ Clinical Pharmacy Specialist | Primary: Pharmacist Clinician (PhC)/ Clinical Pharmacy Specialist

## 2017-11-04 ENCOUNTER — Ambulatory Visit: Admit: 2017-11-04 | Discharge: 2017-11-04 | Payer: PRIVATE HEALTH INSURANCE

## 2017-11-04 DIAGNOSIS — Z794 Long term (current) use of insulin: Secondary | ICD-10-CM

## 2017-11-04 DIAGNOSIS — E119 Type 2 diabetes mellitus without complications: Principal | ICD-10-CM

## 2017-11-04 DIAGNOSIS — Z9484 Stem cells transplant status: Principal | ICD-10-CM

## 2017-11-04 DIAGNOSIS — D89813 Graft-versus-host disease, unspecified: Secondary | ICD-10-CM

## 2017-11-04 DIAGNOSIS — Z9481 Bone marrow transplant status: Principal | ICD-10-CM

## 2017-11-04 MED ORDER — PREDNISONE 10 MG TABLET: 15 mg | Freq: Every day | 0 refills | 0 days

## 2017-11-04 MED ORDER — RUXOLITINIB 10 MG TABLET
ORAL_TABLET | Freq: Two times a day (BID) | ORAL | 6 refills | 0.00000 days | Status: CP
Start: 2017-11-04 — End: 2017-12-04

## 2017-11-04 MED ORDER — OMEGA-3 ACID ETHYL ESTERS 1 GRAM CAPSULE
ORAL_CAPSULE | Freq: Two times a day (BID) | ORAL | 5 refills | 0.00000 days | Status: CP
Start: 2017-11-04 — End: 2018-03-17

## 2017-11-04 MED ORDER — INSULIN GLARGINE (U-100) 100 UNIT/ML SUBCUTANEOUS SOLUTION
Freq: Every day | SUBCUTANEOUS | 6 refills | 0.00000 days
Start: 2017-11-04 — End: ?

## 2017-11-04 MED ORDER — INSULIN LISPRO (U-100) 100 UNIT/ML SUBCUTANEOUS PEN
2 refills | 0 days
Start: 2017-11-04 — End: ?

## 2017-11-04 MED ORDER — PREDNISONE 10 MG TABLET
Freq: Every day | ORAL | 0 refills | 0.00000 days
Start: 2017-11-04 — End: 2017-11-18

## 2017-11-18 ENCOUNTER — Ambulatory Visit: Admit: 2017-11-18 | Discharge: 2017-11-18 | Payer: PRIVATE HEALTH INSURANCE

## 2017-11-18 ENCOUNTER — Other Ambulatory Visit: Admit: 2017-11-18 | Discharge: 2017-11-18 | Payer: PRIVATE HEALTH INSURANCE

## 2017-11-18 DIAGNOSIS — Z9484 Stem cells transplant status: Secondary | ICD-10-CM

## 2017-11-18 DIAGNOSIS — D89813 Graft-versus-host disease, unspecified: Secondary | ICD-10-CM

## 2017-11-18 DIAGNOSIS — Z9481 Bone marrow transplant status: Secondary | ICD-10-CM

## 2017-11-18 MED ORDER — TACROLIMUS 0.5 MG CAPSULE
ORAL_CAPSULE | 6 refills | 0 days | Status: CP
Start: 2017-11-18 — End: 2018-05-19

## 2017-11-18 MED ORDER — PREDNISONE 10 MG TABLET
Freq: Every day | ORAL | 0 refills | 0 days
Start: 2017-11-18 — End: 2017-12-02

## 2017-12-02 ENCOUNTER — Ambulatory Visit
Admit: 2017-12-02 | Discharge: 2017-12-03 | Payer: PRIVATE HEALTH INSURANCE | Attending: Hematology & Oncology | Primary: Hematology & Oncology

## 2017-12-02 ENCOUNTER — Other Ambulatory Visit: Admit: 2017-12-02 | Discharge: 2017-12-03 | Payer: PRIVATE HEALTH INSURANCE

## 2017-12-02 DIAGNOSIS — Z794 Long term (current) use of insulin: Secondary | ICD-10-CM

## 2017-12-02 DIAGNOSIS — D89813 Graft-versus-host disease, unspecified: Secondary | ICD-10-CM

## 2017-12-02 DIAGNOSIS — R197 Diarrhea, unspecified: Principal | ICD-10-CM

## 2017-12-02 DIAGNOSIS — Z9481 Bone marrow transplant status: Secondary | ICD-10-CM

## 2017-12-02 DIAGNOSIS — E119 Type 2 diabetes mellitus without complications: Secondary | ICD-10-CM

## 2017-12-02 DIAGNOSIS — C8120 Mixed cellularity classical Hodgkin lymphoma, unspecified site: Secondary | ICD-10-CM

## 2017-12-02 DIAGNOSIS — Z9484 Stem cells transplant status: Secondary | ICD-10-CM

## 2017-12-02 DIAGNOSIS — T8609 Other complications of bone marrow transplant: Secondary | ICD-10-CM

## 2017-12-02 MED ORDER — PREDNISONE 1 MG TABLET
ORAL_TABLET | 1 refills | 0 days
Start: 2017-12-02 — End: 2017-12-04

## 2017-12-04 MED ORDER — PREDNISONE 1 MG TABLET
ORAL_TABLET | 1 refills | 0 days
Start: 2017-12-04 — End: 2018-07-07

## 2017-12-07 ENCOUNTER — Other Ambulatory Visit (HOSPITAL_COMMUNITY): Payer: Self-pay | Admitting: Interventional Radiology

## 2017-12-07 DIAGNOSIS — I728 Aneurysm of other specified arteries: Secondary | ICD-10-CM

## 2017-12-24 ENCOUNTER — Ambulatory Visit
Admission: RE | Admit: 2017-12-24 | Discharge: 2017-12-24 | Disposition: A | Discharge status: Home / Self Care | Payer: Commercial Managed Care - PPO | Source: Ambulatory Visit | Attending: Interventional Radiology | Admitting: Interventional Radiology

## 2017-12-24 ENCOUNTER — Ambulatory Visit (HOSPITAL_COMMUNITY)
Admission: RE | Admit: 2017-12-24 | Discharge: 2017-12-24 | Disposition: A | Discharge status: Home / Self Care | Payer: Commercial Managed Care - PPO | Source: Ambulatory Visit | Attending: Interventional Radiology | Admitting: Interventional Radiology

## 2017-12-24 ENCOUNTER — Encounter (HOSPITAL_COMMUNITY): Payer: Self-pay

## 2017-12-24 DIAGNOSIS — I728 Aneurysm of other specified arteries: Secondary | ICD-10-CM | POA: Diagnosis not present

## 2017-12-24 HISTORY — PX: IR RADIOLOGIST EVAL & MGMT: IMG5224

## 2017-12-24 MED ORDER — HEPARIN SOD (PORK) LOCK FLUSH 100 UNIT/ML IV SOLN
500.0000 [IU] | Freq: Once | INTRAVENOUS | Status: AC
  Administered 2017-12-24: 500 [IU] via INTRAVENOUS

## 2017-12-24 MED ORDER — SODIUM CHLORIDE (PF) 0.9 % IJ SOLN
INTRAMUSCULAR | Status: AC
  Filled 2017-12-24: qty 50

## 2017-12-24 MED ORDER — HEPARIN SOD (PORK) LOCK FLUSH 100 UNIT/ML IV SOLN
INTRAVENOUS | Status: AC
  Administered 2017-12-24: 500 [IU] via INTRAVENOUS
  Filled 2017-12-24: qty 5

## 2017-12-24 MED ORDER — IOPAMIDOL (ISOVUE-370) INJECTION 76%
INTRAVENOUS | Status: AC
  Filled 2017-12-24: qty 100

## 2017-12-24 MED ORDER — IOPAMIDOL (ISOVUE-370) INJECTION 76%
100.0000 mL | Freq: Once | INTRAVENOUS | Status: AC | PRN
  Administered 2017-12-24: 100 mL via INTRAVENOUS

## 2017-12-24 NOTE — Progress Notes (Signed)
Chief Complaint: Patient was seen in follow-up today for  Chief Complaint  Patient presents with  . Follow-up    4 yr follow up Embo of Hepatic Artery Aneurysm   at the request of Batina Dougan  Referring Physician(s): Correna Meacham  History of Present Illness: [name] [name] is a 52 y.o. 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 This was discovered incidentally on imaging workup for lymphoma. Patient underwent endovascular embolization using a combination of coils and liquid embolic in January 0175.  He is currently doing well and currently in remission regarding his lymphoma. 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  He is currently transitioning from prednisone to Tacrolimus.  He is at his baseline today with no acute complaints.   Past Medical History:  Diagnosis Date  . Cancer ([place]) 2015   cervical lymphadenopathy  . Depression   . Diabetes mellitus without complication (HCC)    Pre-diabetes  . Encounter for antineoplastic chemotherapy 03/20/2015  . 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 Headache(784.0)   . Hepatic artery aneurysm ([place]) 2015  . Hodgkin lymphoma ([place]) 2015  . Hypertension   . Pain, dental 02/12/2015  . Thyroid disease    Hx: of    Past Surgical History:  Procedure Laterality Date  . IR GENERIC HISTORICAL  01/01/2016   IR RADIOLOGIST EVAL & MGMT 01/01/2016 [name], MD [facility] INTERV RAD  . IR RADIOLOGIST EVAL & MGMT  12/24/2017  . MASS EXCISION Left 12/31/2012   Procedure: CERVICAL LYMPH NODE BIOPSY;  Surgeon: [name], MD;  Location: [facility];  Service: General;  Laterality: Left;  . TONSILLECTOMY      Allergies: Patient has no known  allergies.  Medications: Prior to Admission medications   Medication Sig Start Date End Date Taking? Authorizing Provider  amLODipine (NORVASC) 5 MG tablet Take 5 mg by mouth daily.   Yes [provider]  Azelastine-Fluticasone 137-50 MCG/ACT SUSP Place 1 spray into the nose 2 (two) times daily.   Yes [provider]  cefdinir (OMNICEF) 300 MG capsule Take 300 mg by mouth 2 (two) times daily.   Yes [provider]  Cholecalciferol (D 1000) 1000 units capsule Take 1,000 mg by mouth daily.   Yes [provider]  eszopiclone (LUNESTA) 2 MG TABS tablet Take 2 mg by mouth at bedtime as needed for sleep. Reported on 04/05/2015   Yes [provider]  insulin glargine (LANTUS) 100 UNIT/ML injection Inject 15 Units into the skin at bedtime.   Yes [provider]  insulin lispro (HUMALOG) 100 UNIT/ML injection Inject into the skin 2 times daily at 12 noon and 4 pm. Sliding scale for dose   Yes [provider]  Isavuconazonium Sulfate (CRESEMBA) 186 MG CAPS Take 2 capsules by mouth daily.   Yes [provider]  levothyroxine (SYNTHROID, LEVOTHROID) 200 MCG tablet Take 200 mcg by mouth daily before breakfast.   Yes [provider]  metFORMIN (GLUCOPHAGE) 500 MG tablet Take 500 mg by mouth 2 (two) times daily. Reported on 07/06/2015 07/28/14  Yes [provider]  omega-3 acid ethyl esters (LOVAZA) 1 g capsule TAKE 2 CAPSULES BY MOUTH TWICE A DAY 09/09/16  Yes Larey Dresser, MD  predniSONE (DELTASONE) 10 MG tablet Take 10  mg by mouth daily with breakfast. Take 1/2 tablet by mouth daily   Yes [provider]  ruxolitinib phosphate (JAKAFI) 10 MG tablet Take 20 mg by mouth 2 (two) times daily.   Yes [provider]  sertraline (ZOLOFT) 100 MG tablet Take 100 mg by mouth daily with breakfast.    Yes [provider]  Sulfamethoxazole-Trimethoprim (SULFAMETHOXAZOLE-TMP DS PO) Take 1 tablet by mouth  daily.   Yes [provider]  valACYclovir (VALTREX) 500 MG tablet Take 500 mg by mouth daily.   Yes [provider]  aspirin EC 81 MG tablet Take 1 tablet (81 mg total) by mouth daily. 02/23/15   Larey Dresser, MD  fenofibrate (TRICOR) 145 MG tablet Take 1 tablet (145 mg total) by mouth daily. Patient not taking: Reported on 12/24/2017 06/12/15   Larey Dresser, MD  lidocaine-prilocaine (EMLA) cream Apply 1 application topically as needed. Patient not taking: Reported on 12/24/2017 06/04/15   [name], NP  lovastatin (ALTOPREV) 40 MG 24 hr tablet Take 40 mg by mouth at bedtime.    [provider]  ondansetron (ZOFRAN) 8 MG tablet Every 12 hours as needed for nausea/vomiting.  Begin on day 3 after chemo. Patient not taking: Reported on 01/01/2016 02/21/15   [name], MD  oxyCODONE (OXY IR/ROXICODONE) 5 MG immediate release tablet Take 1 tablet (5 mg total) by mouth every 6 (six) hours as needed for severe pain. Patient not taking: Reported on 12/24/2017 03/16/14   [name], MD  prochlorperazine (COMPAZINE) 10 MG tablet Take 1 tablet (10 mg total) by mouth every 6 (six) hours as needed for nausea or vomiting. Patient not taking: Reported on 01/01/2016 02/21/15   [name], MD  sodium fluoride (PREVIDENT 5000 PLUS) 1.1 % CREA dental cream Apply fluoride to tooth brush. Brush teeth for 2 minutes. Spit out excess-DO NOT swallow. Repeat nightly. Patient not taking: Reported on 12/24/2017 04/09/15   [name], DDS     Family History  Problem Relation Age of Onset  . Aneurysm Mother 76       brain  . Obesity Mother   . Heart disease Mother   . AAA (abdominal aortic aneurysm) Mother        dx in her 37s  . Hodgkin's lymphoma Father        dx in his 74s, and again in his 76s  . Diabetes Father   . Cancer Brother        NOS dx in his 22s  . Bone cancer Maternal Uncle 40       don't know if primary or metastises    Social History    Socioeconomic History  . Marital status: Divorced    Spouse name: Not on file  . Number of children: 0  . Years of education: Not on file  . Highest education level: Not on file  Occupational History    Employer: [employer]  Social Needs  . Financial resource strain: Not on file  . Food insecurity:    Worry: Not on file    Inability: Not on file  . Transportation needs:    Medical: Not on file    Non-medical: Not on file  Tobacco Use  . Smoking status: Former Smoker    Packs/day: 1.00    Years: 31.00    Pack years: 31.00    Types: Cigarettes    Last attempt to quit: 06/07/2012    Years since quitting: 5.5  .  Smokeless tobacco: Never Used  Substance and Sexual Activity  . Alcohol use: No  . Drug use: No  . Sexual activity: Not Currently  Lifestyle  . Physical activity:    Days per week: Not on file    Minutes per session: Not on file  . Stress: Not on file  Relationships  . Social connections:    Talks on phone: Not on file    Gets together: Not on file    Attends religious service: Not on file    Active member of club or organization: Not on file    Attends meetings of clubs or organizations: Not on file    Relationship status: Not on file  Other Topics Concern  . Not on file  Social History Narrative  . Not on file    Review of Systems: A 12 point ROS discussed and pertinent positives are indicated in the HPI above.  All other systems are negative.  Review of Systems  Vital Signs: BP 110/80   Pulse 80   Temp 97.7 F (36.5 C) (Oral)   Resp 15   Ht 5\' 9"  (1.753 m)   Wt 73.5 kg   SpO2 97%   BMI 23.92 kg/m   Physical Exam Vitals signs and nursing note reviewed.  Constitutional:      Appearance: Normal appearance.  HENT:     Head: Normocephalic and atraumatic.  Eyes:     Conjunctiva/sclera:     Right eye: Right conjunctiva is injected.     Left eye: Left conjunctiva is injected.  Cardiovascular:     Rate and Rhythm: Normal rate.   Pulmonary:     Effort: Pulmonary effort is normal.  Abdominal:     General: Abdomen is flat. There is no distension.     Tenderness: There is no abdominal tenderness.  Skin:    General: Skin is warm and dry.  Neurological:     Mental Status: He is alert and oriented to person, place, and time. Mental status is at baseline.  Psychiatric:        Mood and Affect: Mood normal.        Behavior: Behavior normal.      Imaging: Ct Angio Abdomen W &/or Wo Contrast  Result Date: 12/24/2017 CLINICAL DATA:  52 year old male with a history of Hodgkin's lymphoma diagnosed in 2014 now status post treatment and in remission. During workup for his lymphoma, he was also noted to have a giant hepatic artery aneurysm which was coil embolized in January of 2015. He presents for surveillance imaging. EXAM: CT ANGIOGRAPHY ABDOMEN TECHNIQUE: Multidetector CT imaging of the abdomen was performed using the standard protocol during bolus administration of intravenous contrast. Multiplanar reconstructed images and MIPs were obtained and reviewed to evaluate the vascular anatomy. CONTRAST:  120mL ISOVUE-370 IOPAMIDOL (ISOVUE-370) INJECTION 76% COMPARISON:  Most recent prior CT scan 01/01/2016 FINDINGS: VASCULAR Aorta: Minimal ectasia of the infrarenal segment measuring up to 2.4 cm. No aneurysm or significant atherosclerotic plaque. Celiac: The origin of the celiac artery is widely patent as are the left gastric artery and splenic artery. The common hepatic artery remains successfully coil embolized. There is no perfusion of the giant hepatic artery aneurysm. The aneurysm is slightly smaller in size at 7.1 by 5 point 1 cm compared to 7.6 x 5.0 cm previously. SMA: Widely patent. Replaced right hepatic artery. Intrahepatic collaterals provides supply to the left hepatic arteries. Renals: 2 renal arteries bilaterally. All for renal arteries are patent without evidence of dissection,  aneurysm or fibromuscular dysplasia. IMA:  Patent without evidence of aneurysm, dissection, vasculitis or significant stenosis. Inflow: Patent without evidence of aneurysm, dissection, vasculitis or significant stenosis. Veins: No focal venous abnormality. Review of the MIP images confirms the above findings. NON-VASCULAR Lower chest: The lung bases are clear. Visualized cardiac structures are within normal limits for size. No pericardial effusion. Unremarkable visualized distal thoracic esophagus. Hepatobiliary: Normal hepatic contour and morphology. No discrete hepatic lesion. No intra or extrahepatic biliary ductal dilatation. The gallbladder is normal. Pancreas: Unremarkable. No pancreatic ductal dilatation or surrounding inflammatory changes. Spleen: Normal in size without focal abnormality. Adrenals/Urinary Tract: Adrenal glands are unremarkable. Kidneys are normal, without renal calculi, focal lesion, or hydronephrosis. Bladder is unremarkable. Stomach/Bowel: Stomach is within normal limits. Appendix appears normal. No evidence of bowel wall thickening, distention, or inflammatory changes. Lymphatic: No suspicious lymphadenopathy. Small para-aortic lymph nodes are again noted without significant interval change. The largest visualized lymph node is in the proximal left common iliac chain and measures 0.9 cm in short axis compared to 1.5 cm previously. Other: No abdominal wall hernia or abnormality. No abdominopelvic ascites. Musculoskeletal: No acute or significant osseous findings. Stable expansion of the left neural foramen with a well-circumscribed sclerotic rim along the indentation of the posterior wall of the L1 vertebral body. 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 IMPRESSION: VASCULAR 1. Slight interval decrease in size of the previously embolized giant hepatic artery aneurysm. No evidence of recurrence. 2. Persistent opacification of the intrahepatic hepatic arteries secondary to the replaced  right hepatic artery. NON-VASCULAR 1. No acute abnormality within the abdomen or pelvis. 2. No enlarging or suspicious lymph nodes identified. Signed, [name] Peaches, MD, [facility] Radiology Electronically Signed   By: [name] M.D.   On: 12/24/2017 10:00   Ir Radiologist Eval & Mgmt  Result Date: 12/24/2017 Please refer to notes tab for details about interventional procedure. (Op Note)   Labs:  CBC: Recent Labs    07/19/17 0705  WBC 18.2*  HGB 7.4*  HCT 22.1*  PLT 187    COAGS: No results for input(s): INR, APTT in the last 8760 hours.  BMP: Recent Labs    07/19/17 0705  NA 135  K 5.0  CL 102  CO2 21*  GLUCOSE 786*  BUN 31*  CALCIUM 7.7*  CREATININE 1.88*  GFRNONAA 40*  GFRAA 46*    LIVER FUNCTION TESTS: Recent Labs    07/19/17 0705  BILITOT 0.8  AST 130*  ALT 192*  ALKPHOS 495*  PROT 4.4*  ALBUMIN 2.3*    TUMOR MARKERS: No results for input(s): AFPTM, CEA, CA199, CHROMGRNA in the last 8760 hours.  Assessment and Plan:  Hepatic artery aneurysm remains well treated and has in fact decreased in size slightly.   1.) Repeat CTA and return clinic visit in 2 years.    Electronically Signed: Laurence Ferrari, [place] 12/24/2017, 12:00 PM   I spent a total of  15 Minutes in face to face in clinical consultation, greater than 50% of which was counseling/coordinating care for hepatic artery pseudoaneurysm.

## 2017-12-30 ENCOUNTER — Ambulatory Visit
Admit: 2017-12-30 | Discharge: 2017-12-31 | Payer: PRIVATE HEALTH INSURANCE | Attending: Hematology & Oncology | Primary: Hematology & Oncology

## 2017-12-30 ENCOUNTER — Other Ambulatory Visit: Admit: 2017-12-30 | Discharge: 2017-12-31 | Payer: PRIVATE HEALTH INSURANCE

## 2017-12-30 DIAGNOSIS — Z794 Long term (current) use of insulin: Secondary | ICD-10-CM

## 2017-12-30 DIAGNOSIS — D89813 Graft-versus-host disease, unspecified: Secondary | ICD-10-CM

## 2017-12-30 DIAGNOSIS — Z9484 Stem cells transplant status: Secondary | ICD-10-CM

## 2017-12-30 DIAGNOSIS — Z9481 Bone marrow transplant status: Principal | ICD-10-CM

## 2017-12-30 DIAGNOSIS — E1169 Type 2 diabetes mellitus with other specified complication: Secondary | ICD-10-CM

## 2017-12-30 DIAGNOSIS — C8120 Mixed cellularity classical Hodgkin lymphoma, unspecified site: Principal | ICD-10-CM

## 2017-12-30 DIAGNOSIS — D899 Disorder involving the immune mechanism, unspecified: Secondary | ICD-10-CM

## 2018-01-12 ENCOUNTER — Ambulatory Visit
Admit: 2018-01-12 | Discharge: 2018-01-13 | Payer: PRIVATE HEALTH INSURANCE | Attending: Student in an Organized Health Care Education/Training Program | Primary: Student in an Organized Health Care Education/Training Program

## 2018-01-12 DIAGNOSIS — R197 Diarrhea, unspecified: Principal | ICD-10-CM

## 2018-01-26 MED ORDER — CREON 24,000-76,000-120,000 UNIT CAPSULE,DELAYED RELEASE
ORAL_CAPSULE | 11 refills | 0 days | Status: CP
Start: 2018-01-26 — End: 2018-02-19

## 2018-01-26 NOTE — Unmapped (Signed)
Per test claim for Creon at the [facility], patient needs Medication Assistance Program for High Copay.

## 2018-01-26 NOTE — Unmapped (Signed)
Left a VM for Mr. Arambula. Low elastase strongly suggestive of exocrine pancreatic insufficiency.   - will start pancreatic enzymes  - Will get MRI/MRCP to eval for obstruction of pancreatic duct.

## 2018-01-29 DIAGNOSIS — K8681 Exocrine pancreatic insufficiency: Principal | ICD-10-CM

## 2018-01-30 ENCOUNTER — Ambulatory Visit: Admit: 2018-01-30 | Discharge: 2018-01-30 | Payer: PRIVATE HEALTH INSURANCE

## 2018-02-12 ENCOUNTER — Ambulatory Visit: Admit: 2018-02-12 | Discharge: 2018-02-13 | Payer: PRIVATE HEALTH INSURANCE

## 2018-02-12 DIAGNOSIS — K8681 Exocrine pancreatic insufficiency: Principal | ICD-10-CM

## 2018-02-17 ENCOUNTER — Other Ambulatory Visit: Admit: 2018-02-17 | Discharge: 2018-02-18 | Payer: PRIVATE HEALTH INSURANCE

## 2018-02-17 ENCOUNTER — Ambulatory Visit
Admit: 2018-02-17 | Discharge: 2018-02-18 | Payer: PRIVATE HEALTH INSURANCE | Attending: Hematology & Oncology | Primary: Hematology & Oncology

## 2018-02-17 DIAGNOSIS — Z9484 Stem cells transplant status: Secondary | ICD-10-CM

## 2018-02-17 DIAGNOSIS — C8120 Mixed cellularity classical Hodgkin lymphoma, unspecified site: Principal | ICD-10-CM

## 2018-02-17 DIAGNOSIS — Z9481 Bone marrow transplant status: Secondary | ICD-10-CM

## 2018-02-17 DIAGNOSIS — K8681 Exocrine pancreatic insufficiency: Secondary | ICD-10-CM

## 2018-02-17 DIAGNOSIS — D899 Disorder involving the immune mechanism, unspecified: Secondary | ICD-10-CM

## 2018-02-17 LAB — CBC W/ AUTO DIFF
BASOPHILS ABSOLUTE COUNT: 0 10*9/L (ref 0.0–0.1)
BASOPHILS RELATIVE PERCENT: 0.3 %
EOSINOPHILS ABSOLUTE COUNT: 0.1 10*9/L (ref 0.0–0.4)
EOSINOPHILS RELATIVE PERCENT: 1.5 %
HEMATOCRIT: 44.5 % (ref 41.0–53.0)
HEMOGLOBIN: 14.3 g/dL (ref 13.5–17.5)
LARGE UNSTAINED CELLS: 3 % (ref 0–4)
LYMPHOCYTES ABSOLUTE COUNT: 1.9 10*9/L (ref 1.5–5.0)
MEAN CORPUSCULAR HEMOGLOBIN CONC: 32.1 g/dL (ref 31.0–37.0)
MEAN CORPUSCULAR HEMOGLOBIN: 34.5 pg — ABNORMAL HIGH (ref 26.0–34.0)
MEAN CORPUSCULAR VOLUME: 107.5 fL — ABNORMAL HIGH (ref 80.0–100.0)
MEAN PLATELET VOLUME: 8.3 fL (ref 7.0–10.0)
MONOCYTES ABSOLUTE COUNT: 0.6 10*9/L (ref 0.2–0.8)
MONOCYTES RELATIVE PERCENT: 8.2 %
NEUTROPHILS ABSOLUTE COUNT: 4.2 10*9/L (ref 2.0–7.5)
NEUTROPHILS RELATIVE PERCENT: 60.4 %
PLATELET COUNT: 439 10*9/L (ref 150–440)
RED BLOOD CELL COUNT: 4.14 10*12/L — ABNORMAL LOW (ref 4.50–5.90)
RED CELL DISTRIBUTION WIDTH: 16.1 % — ABNORMAL HIGH (ref 12.0–15.0)
WBC ADJUSTED: 7 10*9/L (ref 4.5–11.0)

## 2018-02-17 LAB — COMPREHENSIVE METABOLIC PANEL
ALBUMIN: 3.8 g/dL (ref 3.5–5.0)
ALKALINE PHOSPHATASE: 751 U/L — ABNORMAL HIGH (ref 38–126)
ALT (SGPT): 91 U/L — ABNORMAL HIGH (ref <50)
ANION GAP: 9 mmol/L (ref 7–15)
AST (SGOT): 123 U/L — ABNORMAL HIGH (ref 19–55)
BLOOD UREA NITROGEN: 11 mg/dL (ref 7–21)
BUN / CREAT RATIO: 12
CALCIUM: 10 mg/dL (ref 8.5–10.2)
CO2: 31 mmol/L — ABNORMAL HIGH (ref 22.0–30.0)
CREATININE: 0.94 mg/dL (ref 0.70–1.30)
EGFR CKD-EPI AA MALE: >90 mL/min/{1.73_m2} (ref >=60)
EGFR CKD-EPI NON-AA MALE: >90 mL/min/{1.73_m2} (ref >=60)
GLUCOSE RANDOM: 154 mg/dL (ref 70–179)
POTASSIUM: 4.8 mmol/L (ref 3.5–5.0)
PROTEIN TOTAL: 6.9 g/dL (ref 6.5–8.3)
SODIUM: 142 mmol/L (ref 135–145)

## 2018-02-17 LAB — CALCIUM: Calcium:MCnc:Pt:Ser/Plas:Qn:: 10

## 2018-02-17 LAB — MAGNESIUM: Magnesium:MCnc:Pt:Ser/Plas:Qn:: 1.6

## 2018-02-17 LAB — LYMPHOCYTES RELATIVE PERCENT: Lab: 26.6

## 2018-02-17 LAB — TACROLIMUS, TROUGH: Lab: 5.9

## 2018-02-17 LAB — TARGET CELLS

## 2018-02-17 LAB — SLIDE REVIEW

## 2018-02-17 NOTE — Unmapped (Addendum)
1. Good to see you today.  Our BMT Pharmacists will work on getting you the Creon pills and will call you to let you know the progress on this.  2. Given that your liver enzymes remain elevated, please STOP Crestor. We will continue to monitor this.  3. Your labs from today:   Lab on 02/17/2018   Component Date Value Ref Range Status   ??? Collection 02/17/2018 Collected   Final   ??? WBC 02/17/2018 7.0  4.5 - 11.0 10*9/L Final   ??? RBC 02/17/2018 4.14* 4.50 - 5.90 10*12/L Final   ??? HGB 02/17/2018 14.3  13.5 - 17.5 g/dL Final   ??? HCT 16/10/9602 44.5  41.0 - 53.0 % Final   ??? MCV 02/17/2018 107.5* 80.0 - 100.0 fL Final   ??? MCH 02/17/2018 34.5* 26.0 - 34.0 pg Final   ??? MCHC 02/17/2018 32.1  31.0 - 37.0 g/dL Final   ??? RDW 54/09/8117 16.1* 12.0 - 15.0 % Final   ??? MPV 02/17/2018 8.3  7.0 - 10.0 fL Final   ??? Platelet 02/17/2018 439  150 - 440 10*9/L Final   ??? Neutrophils % 02/17/2018 60.4  % Final   ??? Lymphocytes % 02/17/2018 26.6  % Final   ??? Monocytes % 02/17/2018 8.2  % Final   ??? Eosinophils % 02/17/2018 1.5  % Final   ??? Basophils % 02/17/2018 0.3  % Final   ??? Absolute Neutrophils 02/17/2018 4.2  2.0 - 7.5 10*9/L Final   ??? Absolute Lymphocytes 02/17/2018 1.9  1.5 - 5.0 10*9/L Final   ??? Absolute Monocytes 02/17/2018 0.6  0.2 - 0.8 10*9/L Final   ??? Absolute Eosinophils 02/17/2018 0.1  0.0 - 0.4 10*9/L Final   ??? Absolute Basophils 02/17/2018 0.0  0.0 - 0.1 10*9/L Final   ??? Large Unstained Cells 02/17/2018 3  0 - 4 % Final   ??? Macrocytosis 02/17/2018 Marked* Not Present Final   ??? Anisocytosis 02/17/2018 Slight* Not Present Final

## 2018-02-17 NOTE — Unmapped (Signed)
BMTCT Routine Follow-up Clinic Note:    Patient Name: Zachary Mathis  Medical Record Number:  [id number]  Encounter Date: 02/17/2018    BMT Attending MD: Alverda Skeans, MD      Allogeneic SCT: 08/29/15 [10/10, CMV+, ABO matched, male]  CAR T cell infusion [[id number]]: 08/04/14  Autologous SCT: 07/18/14    Interval History:  Zachary Mathis is a 53 y.o. male with a diagnosis of Hodgkins Lymphoma.  Zachary Mathis now presents for routine follow-up post allogeneic stem cell transplant. 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     He was last hospitalized after transfer from OSH [7/7 - 07/23/17] with DKA and an acute drop in hgb which were thought to be r/t GI bleeding. During his admission, he had colonoscopy and EGD which did not reveal an actively bleeding sites but revealed multiple non bleeding duodenal ulcers. His pathology was not c/w GHVD or viral cytopathic effect.       Zachary Mathis is here today for follow up. He has seen Dr. Mitzi Davenport and Dr. Zachery Dauer for persistent diarrhea, where workup revealed a low stool elastase and he was diagnosed with pancreatic insufficiency. He was prescribed Creon but was unable to get this due to insurance issues.  His appetite remains good and he is able to eat full meals - Weight is stable however he has not gained since his last visit. He continues to have persistent nausea that he pushes through to eat meals (unchanged from prior).  Fluid intake has been adequate.  Food tastes normal.    He denies vomiting.  C/O nausea, diarrhea or abdominal pain - diarrhea that is large volume, foul smelling, oily and greyish/yellowish in color 4-5 times weekly, not associated with abdominal pain or particular times/foods (that he can recall), also unchanged from prior.    Currently [place]ner identifies skin as dry.   There is no rash.   He is applying lotion to skin 1 times per day.      Since the last visit here [place] reports decreased energy levels, unchanged from his last visit  Typical daily activities include: going to work and spending time with his girlfriend.     Sleeping: ok most nights but uses PRN sleeping pills at times..  Nighttime interruptions include: none    Denies infectious symptoms.  Denies chest pain, SOB, cough, wheezing, palpitations.    He continues to use goggles for driving but does not use them more frequently. He thinks his eyes and skin have improved since [place].        PMH and PSH have been reviewed and there are no changes.     PSocH:  There are no pertinent changes to PSocH.  The patient is residing at home and he continues to work intermittently.     Allergies:  Patient has no known allergies.     Medications:  Current Outpatient Medications:   ???  amLODIPine (NORVASC) 5 MG tablet, Take 1 tablet (5 mg total) by mouth daily., Disp: 30 tablet, Rfl: 11  ???  azelastine-fluticasone 137-50 mcg/spray Spry, 1 spray by Each Nare route Two (2) times a day., Disp: , Rfl:   ???  calcium carbonate-vitamin D3 600 mg (1,500 mg)-800 unit Chew, Chew 1 tablet Two (2) times a day., Disp: 100 tablet, Rfl: 0  ???  carboxymethylcellulose sodium (REFRESH CELLUVISC) 1 % DpGe, 1 drop every four (4) hours as needed. , Disp: , Rfl:   ???  carboxymethylcellulose sodium (THERATEARS OPHT),  Apply 1 drop to eye every hour., Disp: , Rfl:   ???  cefdinir (OMNICEF) 300 MG capsule, TAKE 1 CAPSULE (300 MG TOTAL) BY MOUTH TWO (2) TIMES A DAY., Disp: , Rfl:   ???  eszopiclone (LUNESTA) 2 MG Tab, TAKE 1 TABLET (2 MG TOTAL) BY MOUTH NIGHTLY AS NEEDED., Disp: , Rfl:   ???  fexofenadine (ALLEGRA) 180 MG tablet, Take 180 mg by mouth daily., Disp: , Rfl:   ???  folic acid (FOLVITE) 1 MG tablet, Take 1 tablet (1 mg total) by mouth daily., Disp: 90 tablet, Rfl: 3  ???  FREESTYLE LIBRE 14 DAY SENSOR kit, USE AS DIRECTED EVERY 14 DAYS, Disp: , Rfl: 4  ???  hypromellose (SYSTANE GEL OPHT), Apply to eye., Disp: , Rfl:   ???  insulin glargine (LANTUS) 100 unit/mL injection, Inject 0.3 mL (30 Units total) under the skin daily., Disp: 15 mL, Rfl: 6  ???  insulin lispro (HUMALOG KWIKPEN INSULIN) 100 unit/mL injection pen, Take 2 units with breakfast, lunch and dinner plus sliding scale 150-200=2, 201-250=3, 251-300=4, 301-350=5, >350=6, Disp: 75 mL, Rfl: 2  ???  isavuconazonium sulfate (CRESEMBA) 186 mg cap capsule, Take 2 capsules (372 mg total) by mouth daily., Disp: 56 each, Rfl: 5  ???  levothyroxine (SYNTHROID, LEVOTHROID) 25 MCG tablet, Take 2 tablets (50 mcg total) by mouth daily. Take with 300mg  for 350mg  total dose, Disp: 90 tablet, Rfl: 5  ???  levothyroxine (SYNTHROID, LEVOTHROID) 300 MCG tablet, Take 300 mcg by mouth daily. , Disp: , Rfl: 1  ???  metFORMIN (GLUCOPHAGE) 500 MG tablet, Take 1 tablet (500 mg total) by mouth 2 (two) times a day with meals., Disp: 60 tablet, Rfl: 11  ???  multivitamin therapeutic with minerals (THERA-M) 27-0.4 mg Tab, Take 1 tablet by mouth daily., Disp: , Rfl:   ???  ocular lubricant (RESTORE PM) 57.3-42.5 % Oint, 1 application Three (3) times a day., Disp: , Rfl:   ???  omega-3 acid ethyl esters (LOVAZA) 1 gram capsule, Take 2 capsules (2 g total) by mouth Two (2) times a day., Disp: 120 capsule, Rfl: 5  ???  pancrelipase, Lip-Prot-Amyl, (CREON) 24,000-76,000 -120,000 unit CpDR delayed release capsule, Take 3 capsules by mouth 3 times a day with meals and 2 capsules 2 times a day with snacks, Disp: 360 capsule, Rfl: 11  ???  pantoprazole (PROTONIX) 40 MG tablet, Take 1 tablet (40 mg total) by mouth Two (2) times a day., Disp: 60 tablet, Rfl: 5  ???  prednisoLONE acetate (PRED FORTE) 1 % ophthalmic suspension, 1 drop every 2 hours x 2 days, then 1 drop 4x/day x 3 days, then 3x/days.  Shake first., Disp: 5 mL, Rfl: 1  ???  predniSONE (DELTASONE) 1 MG tablet, Take 4mg  once daily for one week, then 3mg  once daily for one week, then 2mg  daily for one week, then 1mg  daily for one week, then stop., Disp: 70 tablet, Rfl: 1  ???  rosuvastatin (CRESTOR) 20 MG tablet, Take 1 tablet (20 mg total) by mouth nightly., Disp: 30 tablet, Rfl: 5  ???  ruxolitinib (JAKAFI) 10 mg tablet, Take 20 mg by mouth., Disp: , Rfl:   ???  sertraline (ZOLOFT) 100 MG tablet, TAKE 1 TABLET BY MOUTH EVERY DAY, Disp: 30 tablet, Rfl: 11  ???  sulfamethoxazole-trimethoprim (BACTRIM DS) 800-160 mg per tablet, Take 1 tablet (160 mg of trimethoprim total) by mouth Every Monday, Wednesday, and Friday., Disp: 12 tablet, Rfl: 2  ???  tacrolimus (PROGRAF) 0.5 MG  capsule, 2.5 mg (5 capsules) in the AM and 2.5 mg (5 capsules) in the PM, Disp: 270 capsule, Rfl: 6  ???  valACYclovir (VALTREX) 500 MG tablet, TAKE 1 TABLET BY MOUTH EVERY DAY, Disp: 30 tablet, Rfl: 11  No current facility-administered medications for this visit.     Facility-Administered Medications Ordered in Other Visits:   ???  pneumococcal polysacchride (23-valps) (PNEUMOVAX) 25 mcg/0.5 mL vaccine, , , ,     Review of Systems:  A comprehensive review of systems was negative except for pertinent positives noted in HPI.     Objective:  Vitals:    02/17/18 1207   BP: 122/81   Pulse: 81   Resp: 18   Temp: 36.9 ??C (98.4 ??F)   TempSrc: Oral   SpO2: 98%   Weight: 73.6 kg (162 lb 4.8 oz)   Height: 172.7 cm (5' 7.99)     KPS: 80%, Normal activity with effort; some signs or symptoms of disease (ECOG equivalent 1).     Chronic GVHD Assessment 02/17/18    Skin: BSA based features None (0), % BSA involved: No rash (0), non-BSA based features No sclerotic features (0), Other features:  NONE  Mouth: Mild symptoms with disease signs but not limiting oral intake significantly (1) Lichen planus-like features present (Diagnostic) no  Eyes: Moderate dry eyes, partially affecting ADL, lubrication drops >3x per day/punctal plugs, WITHOUT new vision impairment (2) Ocular GvHD confirmed by opthalmologist yes  GI Tract: features None No symptoms (0)  Liver: Normal total bili and ALT >= 3 - 5x ULN or AP >= 3x ULN (1)  Lungs: symptom score No symptoms (0), FEV1 score No PFTs since last visit  Joints and Fascia: symptom score No symptoms (0) P-ROM shoulder Not done, Elbow Not done, Wrist/Fingers Not done, Ankles Not done      Genital tract: Not examined  Other features due to cGVHD: None    Overall cGVHD Score (current):  Moderate (eyes and liver)     Physical Exam:  General appearance: WM no distress, has lost a significant amount of weight in the peri/post transplant period   Head: Normocephalic, dry mucosa, without obvious abnormality   Eyes: Glasses, squinty eyes, erythema around eyelids, sclera white, non incteric  Neck: no adenopathy, supple, symmetrical, no tenderness/mass/nodules  Mouth: mild dryness, small ulcer (non painful) to left buccal mucosa, no mucoceles or lichenoid changes   Lungs: Clear to auscultation bilaterally  Heart: Regular rate and rhythm, S1, S2 normal, no murmur, click, rub or gallop  Abdomen: soft, non-tender; bowel sounds normal; no masses, no palpable organomegaly  Extremities: No edema  Skin: No rash     Lab Results   Component Value Date    WBC 7.0 02/17/2018    HGB 14.3 02/17/2018    HCT 44.5 02/17/2018    PLT 439 02/17/2018    NEUTROABS 4.2 02/17/2018    EOSABS 0.1 02/17/2018     Lab Results   Component Value Date    NA 142 02/17/2018    K 4.8 02/17/2018    CL 102 02/17/2018    CO2 31.0 (H) 02/17/2018    BUN 11 02/17/2018    CREATININE 0.94 02/17/2018    GLU 154 02/17/2018    CALCIUM 10.0 02/17/2018    MG 1.6 02/17/2018     Lab Results   Component Value Date    BILITOT 0.7 02/17/2018    PROT 6.9 02/17/2018    ALBUMIN 3.8 02/17/2018    ALT 91 (H)  02/17/2018    AST 123 (H) 02/17/2018    ALKPHOS 751 (H) 02/17/2018    LDH 734 (H) 09/30/2017     Assessment/Plan:  Disease/BMT: Mr. Thompson has a complicated BMT history including auto SCT (07/18/14), CAR-T infusion (CART-T LCCC 1524 trial; 08/04/14), and most recently allogeneic SCT (at [facility]; 08/29/15) for relapsed HD. He presented back to [facility] to re-establish care with Dr. Oswaldo Done on D+77 s/p MUD HSCT.   * For comprehensive history of previous SCT and CAR-T infusion, please see clinic note from 11/1/017.  HCT-CI: Unknown   Match: MUD (10/10, CMV+, ABO matched, male)  Conditioning: Pentostatin + 600 cGy TBI (RIC)  Reported Cell Dose: 3.93 x 10^6 CD34+ cells/kg.  Post-SCT Marrow Evaluations:  11/07/15: D+60 Post HSCT BmBx [at [facility]].      ** Per outside report: Trilineage hematopoesis. No immunophenotypic e/o monoclonal non-Hodgkin B-cell lymphoproliferative disease. Although no unusual phenotype T cells are identified, surface markers will not routinely detect monoclonal T lymphocytes. No increase in CD34+ CD117+ myeloblasts.  11/27/15: D+ 90. Normocellular 50% w/TLH and no morphologic e/o classical Hodgkin lymphoma. ??  02/20/16: PET/CT w/ NED.  08/05/17: PET/CT w/NED. [place] 1.  ??  Chimerism:  - Full donor chimerism in blood (9/25)and BmBx since allo HSCT.   - Continuing to follow     Heme:  - Leukocytosis resolved, likely steroid related and improving with taper. Mild macrocytic anemia. Normal platelets.     *Normal  B12 and folate levels.   ??  GvHD:   Prophylaxis:  - MTX/Tac/Siro. Siro stopped early due to AKI.    Tacrolimus:   - Stopped tacrolimus 11/05/16.    - Re-started on 09/03/17 w/worsening biopsy proven liver GvHD and occular cGVHD.  - Goal level 5-10 for now.   - Tac level checked today.    Oral symptoms: Improved  07/08/17: Started dexamethasone rinse.    Ocular/ dry eye: Stable   - cGvHD   - Followed by Dr. Armando Reichert at [facility] in Ophthalmology who placed dissolvable plugs and prescribed a regimen of artificial tears and po omega-3 FAs  - Now on on oral prednisone [liver GvHD] and uses cyclosporine drops, artificial tears, refresh eyedrops and restasis.     ** Also has special goggles to wear when driving.      GI/Hepatic:   07/23/17: Started 1mg /kg oral prednisone for elevated liver enzymes.   08/18/17: Liver biopsy results:    ** Histologic findings are most c/w chronic graft-versus-host disease. Moderate portal/periportal fibrosis with widespread ductopenia involving 13/15 portal tracts on H&E stained section. Moderate to severe perisinusoidal fibrosis, irregularly distributed, but predominantly centrizonal.  08/20/17: Added Jakafi due to persistent ocular symptoms and rising transaminases in light of liver bx results - steroid refractory.     ** AST peaked at 416 [08/26/17]    ** ALT peaked at 369 [08/26/17]     ** Tbili was 1.5 and Alk PO4 was 793   09/03/17: LFTs and bilirubinimproving. Added back tacrolimus 2 mg po bid. Tapering prednisone as able.   09/08/17: Taper prednisone dose 35mg  po qd. First tacrolimus trough is 4.2. Increase to 2mg  qAM /2.5mg  qPM; goal 5-10.    09/16/17: LFTs increased from last week. Increase Jakafi to maximum dose 10 mg bid [having significant SI hyperglycemia]. Tac goal 5-10. Prednisone 35 mg qd.   09/30/17: LFTs improved today although tac is subtherapeutic at 2.1. He was out of a=town for 1 week and admits to not taking as instructed. Continue  current dose of 2.5qAM and 2mg  qPM and repeat next appt. Continue Jakafi 10mg  bid and taper prednisone to 25mg . Significant hyperglycemia.   - LFTs remain elevated, stable from last week. Continue Jakafi and tacrolimus.Prednisone 10mg  daily.   11/18/17: Taper pred to 5 mg.  - Prednisone now off.  Continuing jakafi + tacrolimus.    ID:   - Prophy valtrex 500mg  qd,   - Prophy while on steroids:     ** Cefdinir 300 mg BID    ** Bactrim DS qMWF    ** Cresemba 372mg  qd [switched off posa]   -  Rifampin TID x 10 days.  Started on 10/21.  Course is now completed without much benefit.    ??  Viral Screening:  09/30/17: CMV and EBV negative.    GI:   - Endoscopy w/several , non-bleeding duodenal ulcers.   - Continue protonix while on steroids.   - On Rifampin course as above for bacterial overgrowth. Still having persistent foul smelling diarrhea - diagnosed with pancreatic insufficiency - will check with our pharmacists to see if/how to get him Creon as soon as possible.    FEN/Renal:  Chronic Renal Insufficiency: - Baseline post-SCT 1.8-2.1   ??  Hypomagnesemia:  - 1.6 today.     CV:   Hx hyperlipidemia:  - Given hx and ruxolitinib therapy, stopped lovastatin and started higher potency statin; Rosuvastatin 20 mg PO once qd.  Results for FINLEE, MILO (MRN [id number]) as of 10/07/2017 15:43   Ref. Range 10/07/2017 09:02   Cholesterol Latest Ref Range: 100 - 199 mg/dL 440 (H)   Triglycerides Latest Ref Range: 1 - 149 mg/dL 68   HDL Latest Ref Range: 40 - 59 mg/dL 95 (H)   LDL Calculated Latest Ref Range: 60 - 99 mg/dL 347 (H)   Non-HDL Cholesterol Latest Units: mg/dL 425   Chol/HDL Ratio Latest Ref Range: <5.0  2.3   VLDL Cholesterol Cal Latest Ref Range: 12 - 47 mg/dL 95.6   ??  Hx cardiomyopathy:  - Per Care Everywhere: hx chemotherapy induced cardiomyopathy.   - ECHO at [facility] w/EF 50-55%. On PET scan from [facility], found to have age advanced coronary artery atherosclerosis.  - Was on ASA but stopped after GI bleed.   ??  HTN:  - Normotensive on on amlodipine 5mg  qd.     Pulm:   - DLCOc: 61.2%. Hx pulmonary fibrosis following ABVD therapy.   - Pulmonary nodules resolved per last scan: Plan to repeat CT chest with continued PET/CT restaging as scheduled.   08/10/17: PET and CT negative.    Seasonal allergic rhinitis:   - Azelastine-fluticasone spray bid and allegra prn.     Neuro/Pain:   - No issues.     Psych:   Hx depression:   - Mood is stable. Zoloft 100mg  qd.   ??  Insomnia:   - Lunesta prn.    ??  Endocrine:   Hx hypothyroidism:   - Monitoring and medication adjustments per endocrinologist, Dr. Talmage Nap.  ??  Uncontrolled DM II:  - Followed by Dr. Horald Pollen in [place] (Endocrinology).   - Worsened by current steroids.   - Has been uncontrolled for months, HgbA1C 9.5  - Have been relaying blood sugars to Dr. Horald Pollen - he has follow-up in November, per his report communicates closely with her electronically. He would benefit from seeing her sooner and we have discussed this with him.    - We are tapering prednisone, but blood sugars improved overall  but still could be better controlled.  He has some lows and highs but only a 3-4 times every 2 weeks. He is living in the 200s.  Being managed by Dr. Talmage Nap and continuing with SSI - Not increasing insulin at this time due to regular AM episodes of hypoglycemia.    Insulin plan per BMT pharmacist:.  - Continue Lantus to 30 units [place] Walk qd  - Continue Humalog 2 units +SSI.  Test BG before each meal and hold Humalog if BG is <100.  - Continue metformin 500 mg BID. Titrate prn.     Musculoskeletal:   DEXA:  11/23/15: Mildly low bone density. Started calcium per protocol. Zometa given 11/23/15 (continue q 3 months x 3).  07/08/17: Re-started Ca w/D BID while on steroids.    09/09/17: Repeat DEXA showed bone density had worsened in the spine, but remained stable in the femur.     ** We will plan for further Zometa pending insurance approval [denied, pending appeal].      Weight loss:    - Re-discussed wt loss. In 2 months he has lost 20 lbs -  now stabilizing.  - Several potential contributing factors:     ** Muscle wasting d/t steroids and lack of exercise    ** Uncontrolled diabetes.    ** Consider re-consulting nutrition on future appt. Follows-up with his endocrinologist in November.      Summary:  > 2 years post 10/10 MUD allo HSCT for relapsed HD currently fully donor with no active e/o disease.   Counts: Good, macrocytic anemia.  Ocular GvHD: Stable on current regimen.  Liver GvHD: LFTs stable though persistently elevated. Continue Jakafi + Tacrolimus. Now off prednisone  Hyperglycemia: Self administered insulin per SS.    Pancreatic insufficiency: Will do our best to get him Creon as soon as possible.    Follow-up: 83mo with Dr. Oswaldo Done.     Ladell Pier, MD  Assistant Professor of Medicine  Division of Hematology/Oncology  Bone Marrow and Stem Cell Transplant Program  [email or web address]        Future Appointments   Date Time Provider Department Center   02/23/2018 10:40 AM Leodis Sias, MD [facility]   03/17/2018 10:00 AM ONCBMT LABS [facility]   03/17/2018 10:45 AM Kerrie Pleasure, MD [facility]

## 2018-02-18 LAB — CMV COMMENT: Lab: 0

## 2018-02-18 LAB — CMV DNA, QUANTITATIVE, PCR: CMV VIRAL LD: NOT DETECTED

## 2018-02-18 MED FILL — CREON 24,000-76,000-120,000 UNIT CAPSULE,DELAYED RELEASE: 28 days supply | Qty: 360 | Fill #0 | Status: AC

## 2018-02-18 NOTE — Unmapped (Signed)
[facility] Specialty Medication Referral: No PA required    Medication (Brand/Generic): Creon    Initial Benefits Investigation Claim completed with resulted information below:  No PA required  Patient ABLE to fill at [facility]  Insurance Company:  [employer]  Anticipated Copay: $35    As Co-pay is under $25 defined limit, per policy there will be no further investigation of need for financial assistance at this time unless patient requests. This referral has been communicated to the provider and handed off to the [facility] team for further processing and filling of prescribed medication.   ______________________________________________________________________  Please utilize this referral for viewing purposes as it will serve as the central location for all relevant documentation and updates.

## 2018-02-18 NOTE — Unmapped (Addendum)
[facility] pharmacy is filling medication one time.  Patient was told that future fills will go thru [facility] [phone]).  A message has been sent to provider and CPP to send new prescription to [facility] for future fills.  Will schedule a call 3 weeks out to confirm patient is set with new pharmacy to receive his medications and then will dis-enroll him.    [facility]   Patient Onboarding/Medication Counseling    Zachary Mathis is a 53 y.o. male with exocrine pancreatic insufficiency who I am counseling today on initiation of therapy.    Medication: Creon    Verified patient's date of birth / HIPAA.      Education Provided: ?    Dose/Administration discussed: Take 3 capsules by mouth 3 times a day with meals and 2 capsules 2 times a day with snacks. This medication should be taken  with food.  Stressed the importance of taking medication as prescribed and to contact provider if that changes at any time.  Discussed missed dose instructions.    Storage requirements: this medicine should be stored at room temperature.     Side effects / precautions discussed: Discussed common side effects, including Stomach pain or heartburn, Gas, Diarrhea, Constipation, Upset stomach, Headache, Sore throat, Neck pain, Stuffy nose. If patient experiences allergic reaction (hives, rash, itching, redness call MD, if swelling of throat, lips, mouth, or difficulty breathing call 911), signs of gallstones (pain in upper right belly area, right shoulder area, or between shoulder blades; yellowing of skin/eyes, or fever with chills), joint pain/swelling, swollen glands, or mouth/tongue irritation. Also, if severe abdominal pain/bloating or upset stomach, trouble passing stool, they need to call the doctor.  Patient will receive a drug information handout with shipment.    Handling precautions / disposal reviewed:  n/a.    Drug Interactions: other medications reviewed and up to date in Epic.  No drug interactions identified.    Comorbidities/Allergies: reviewed and up to date in Epic.    Verified therapy is appropriate and should continue      Delivery Information    Medication Assistance provided: none    Anticipated copay of $35 reviewed with patient. Verified delivery address in Epic.    Scheduled delivery date: 02/19/18  Medication will be delivered via UPS to the home address in [facility] [facility].  This shipment will not require a signature.      Explained the services we provide at [facility] and that each month we would call to set up refills.  Stressed importance of returning phone calls so that we could ensure they receive their medications in time each month.  Informed patient that we should be setting up refills 7-10 days prior to when they will run out of medication.  Informed patient that welcome packet will be sent.      Patient verbalized understanding of the above information as well as how to contact the pharmacy at [phone] option 4 with any questions/concerns.  The pharmacy is open Monday through Friday 8:30am-4:30pm.  A pharmacist is available 24/7 via pager to answer any clinical questions they may have.        Patient Specific Needs      ? Patient has no physical, cognitive, or cultural barriers.    ? Patient prefers to have medications discussed with  Patient     ? Patient is able to read and understand education materials at a high school level or above.    ?  Patient's primary language is  English           Chief of Staff  [facility] Specialty Pharmacist

## 2018-02-19 MED ORDER — CREON 24,000-76,000-120,000 UNIT CAPSULE,DELAYED RELEASE
ORAL_CAPSULE | 5 refills | 0 days | Status: CP
Start: 2018-02-19 — End: ?
  Filled 2018-02-18: qty 360, 28d supply, fill #0

## 2018-02-19 NOTE — Unmapped (Signed)
Spoke with Mr. Rosner re: Creon supply.  Initial month sent from [facility] (anticipated delivery 02/19/18).  Additional 6 months sent to [facility] per insurance requirements.  Dr. Oswaldo Done aware.    Bettey Costa, PharmD, BCPS, BCOP  BMT Clinical Pharmacist Practitioner

## 2018-02-23 ENCOUNTER — Ambulatory Visit
Admit: 2018-02-23 | Discharge: 2018-02-24 | Payer: PRIVATE HEALTH INSURANCE | Attending: Student in an Organized Health Care Education/Training Program | Primary: Student in an Organized Health Care Education/Training Program

## 2018-02-23 DIAGNOSIS — K8689 Other specified diseases of pancreas: Principal | ICD-10-CM

## 2018-02-23 NOTE — Unmapped (Signed)
[facility] RETURN VISIT NOTE      Initial Referring Provider:  Kerrie Pleasure, MD  [address]  [place], [place] [address]    Primary Care Provider:  No PCP Per Patient    DATE OF SERVICE: 02/23/2018      REASON FOR VISIT: Zachary Mathis is a 53 y.o. male (DOB: [birth year]) who is seen in consultation at the request of Dr. Oswaldo Done for pancreatic insufficiency    ASSESSMENT: Zachary Mathis is a 53 y.o. 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  Regarding pancreatic insufficiency: this was diagnosed in Jan 2020 based on high fecal fat, undetectable fecal elastase, and pancreatic atrophy w/ pancreatic duct obstruction on MRI/MRCP. Etiology is unclear. He has no history of alcohol abuse, acute pancreatitis, or significant tobacco abuse. I am not sure of an association between chronic pancreatitis and GVHD. Perhaps some of the chemo agents he was on for lymphoma contributed. I do not think EUS guided biopsies would change management. Stool consistency has begun to improve with Creon, which he only started 4 days ago. Will give it another 2 weeks to see how much more improvement he has on current dose; I have instructed him to increase to 4 tabs w/ meals and 3 w/ snacks if he is still having steatorrhea after 2 more weeks. Will also check Vitamin A, D, and E levels. Will refer to nutrition.  Finally, his fecal A1AT was low; this suggests protein malabsorption, which is not seen with pancreatic insufficiency (though it can be seen with high volume frequent diarrhea due to dilution). If he is not improving with titration of pancreatic enzymes, will obtain capsule endoscopy to assess for small bowel lymphoma, small bowel GVHD, lymphangiectasia and other small bowel diseases.       PLAN:  - continue creon as above  - f/u vitamin A, D, and E levels  - nutrition referral  - f/u 03/17/18; will contact our schedulers to set up clinic visit that day coordinated with onc clinic visit      Patient was seen and discussed with Dr. Jacqulyn Bath who is in agreement with above stated plan.    Please don't hesitate to contact me with an questions or concerns.    Janeal Holmes, MD  Gastroenterology Fellow       Time spent: 25 minutes, 50 % or more spent in counseling and education.      HISTORY OF PRESENT ILLNESS:   Zachary Mathis is a 53 y.o. 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 I met him in July 2019 when he was hospitalized for weakness and was found to have anemia (Hgb 15 ->7 in 1 week) and DKA.  He underwent EGD and colonoscopy. EGD 07/22/17 showed two large cratered duodenal ulcers. Colonoscopy showed a few polyps. Biopsies of the esophagus, stomach, duodenum, and colon did not show any evidence of GI tract GVHD or opportunistic viral infections.  He then presented to clinic 01/12/18 for 60lb weight loss in 1 year and diarrhea.  Prior to presentation to GI clinic, he had been treated with rifaximin for possible SIBO without improvement. Further work up included testing for GI pathogens, fecal fat, fecal elastase, and stool A1AT. This was notable for undetectable fecal elastase (consistent with fat malabsorption) and undetectable stool A1AT (consistent with protein malabsorption). Follow up MRI/MRCP on 02/12/18 showed Severely atrophic pancreas with mildly dilated duct but no obstruction/mass. We reviewed all of these results today. I prescribed Creon though due to insurance issues he didn't start this medication until 02/19/18. Since starting it, he's hasn't needed imodium. Stools frequency is unchanged (~4-5 times per day) though stools are becoming more formed. Weight is down 6lbs since 01/12/18      PAST MEDICAL HISTORY:  Past Medical History:   Diagnosis Date   ??? Atherosclerosis    ??? Chemotherapy induced cardiomyopathy (CMS-HCC) 02/01/2013    per [facility] records. ECHO at [facility] w/EF 50-55%,   ??? Depression    ??? DM (diabetes mellitus) (CMS-HCC)    ??? Dry eyes    ??? H/O folliculitis     Reports chronic hx of intermittent folliculitis. Multiple abscesses. Had large right groin abscess in March. Required I/d from surgeon.   ??? Headache    ??? Hepatic artery aneurysm (CMS-HCC)     repaired in 2015   ??? HTN (hypertension)    ??? Hyperlipidemia    ??? Hypothyroidism    ??? Lung nodule     small sub-cm nodules on pre-treatment CT   ??? Obesity (BMI 30.0-34.9)    ??? Pulmonary fibrosis (CMS-HCC)     With ABVD therapy   ??? Seasonal allergies    ??? Type 2 diabetes mellitus without complication (CMS-HCC) 11/16/2015       CURRENT MEDICATIONS:  Outpatient Encounter Medications as of 02/23/2018   Medication Sig Dispense Refill   ??? amLODIPine (NORVASC) 5 MG tablet Take 1 tablet (5 mg total) by mouth daily. 30 tablet 11   ??? azelastine-fluticasone 137-50 mcg/spray Spry 1 spray by Each Nare route Two (2) times a day.     ??? calcium carbonate-vitamin D3 600 mg (1,500 mg)-800 unit Chew Chew 1 tablet Two (2) times a day. 100 tablet 0   ??? carboxymethylcellulose sodium (REFRESH CELLUVISC) 1 % DpGe 1 drop every four (4) hours as needed.      ??? carboxymethylcellulose sodium (THERATEARS OPHT) Apply 1 drop to eye every hour.     ??? cefdinir (OMNICEF) 300 MG capsule TAKE 1 CAPSULE (300 MG TOTAL) BY MOUTH TWO (2) TIMES A DAY.     ??? eszopiclone (LUNESTA) 2 MG Tab TAKE 1 TABLET (2 MG TOTAL) BY MOUTH NIGHTLY AS NEEDED.     ??? fexofenadine (ALLEGRA) 180 MG tablet Take 180 mg by mouth daily.     ??? folic acid (FOLVITE) 1 MG tablet Take 1 tablet (1 mg total) by mouth daily. 90 tablet 3   ??? FREESTYLE LIBRE 14 DAY SENSOR kit USE AS DIRECTED EVERY 14 DAYS  4   ??? hypromellose (SYSTANE GEL OPHT) Apply to eye.     ??? insulin glargine (LANTUS) 100 unit/mL injection Inject 0.3 mL (30 Units total) under the skin daily. 15 mL 6   ??? insulin lispro (HUMALOG KWIKPEN INSULIN) 100 unit/mL injection pen Take 2 units with breakfast, lunch and dinner plus sliding scale 150-200=2, 201-250=3, 251-300=4, 301-350=5, >350=6 75 mL 2   ??? isavuconazonium sulfate (CRESEMBA) 186 mg cap  capsule Take 2 capsules (372 mg total) by mouth daily. 56 each 5   ??? levothyroxine (SYNTHROID, LEVOTHROID) 300 MCG tablet Take 300 mcg by mouth daily.   1   ??? metFORMIN (GLUCOPHAGE) 500 MG tablet Take 1 tablet (500 mg total) by mouth 2 (two) times a day with meals. 60 tablet 11   ??? multivitamin therapeutic with minerals (THERA-M) 27-0.4 mg Tab Take 1 tablet by mouth daily.     ??? ocular lubricant (RESTORE PM) 57.3-42.5 % Oint 1 application Three (3) times a day.     ??? omega-3 acid ethyl esters (LOVAZA) 1 gram capsule Take 2 capsules (2 g total) by mouth Two (2) times a day. 120 capsule 5   ??? pancrelipase, Lip-Prot-Amyl, (CREON) 24,000-76,000 -120,000 unit CpDR delayed release capsule Take 3 capsules by mouth 3 times a day with meals and 2 capsules 2 times a day with snacks 360 capsule 5   ??? pantoprazole (PROTONIX) 40 MG tablet Take 1 tablet (40 mg total) by mouth Two (2) times a day. 60 tablet 5   ??? prednisoLONE acetate (PRED FORTE) 1 % ophthalmic suspension 1 drop every 2 hours x 2 days, then 1 drop 4x/day x 3 days, then 3x/days.  Shake first. 5 mL 1   ??? predniSONE (DELTASONE) 1 MG tablet Take 4mg  once daily for one week, then 3mg  once daily for one week, then 2mg  daily for one week, then 1mg  daily for one week, then stop. 70 tablet 1   ??? ruxolitinib (JAKAFI) 10 mg tablet Take 20 mg by mouth.     ??? sertraline (ZOLOFT) 100 MG tablet TAKE 1 TABLET BY MOUTH EVERY DAY 30 tablet 11   ??? sulfamethoxazole-trimethoprim (BACTRIM DS) 800-160 mg per tablet Take 1 tablet (160 mg of trimethoprim total) by mouth Every Monday, Wednesday, and Friday. 12 tablet 2   ??? tacrolimus (PROGRAF) 0.5 MG capsule 2.5 mg (5 capsules) in the AM and 2.5 mg (5 capsules) in the PM 270 capsule 6   ??? valACYclovir (VALTREX) 500 MG tablet TAKE 1 TABLET BY MOUTH EVERY DAY 30 tablet 11   ??? levothyroxine (SYNTHROID, LEVOTHROID) 25 MCG tablet Take 2 tablets (50 mcg total) by mouth daily. Take with 300mg  for 350mg  total dose (Patient not taking: Reported on 02/23/2018) 90 tablet 5   ??? rosuvastatin (CRESTOR) 20 MG tablet Take 1 tablet (20 mg total) by mouth nightly. (Patient not taking: Reported on 02/23/2018) 30 tablet 5     Facility-Administered Encounter Medications as of 02/23/2018   Medication Dose Route Frequency Provider Last Rate Last Dose   ??? pneumococcal polysacchride (23-valps) (PNEUMOVAX) 25 mcg/0.5 mL vaccine                ALLERGIES:  No Known Allergies    SOCIAL HISTORY:  Social History     Socioeconomic History   ??? Marital status: Single     Spouse name: Not on file   ??? Number of children: Not on file   ??? Years of education: Not on file   ??? Highest education level: Not on file   Occupational History   ??? Occupation: Museum/gallery curator   Social Needs   ??? Financial resource strain: Not on file   ??? Food insecurity     Worry: Not on file     Inability: Not on file   ??? Transportation needs     Medical: Not on file     Non-medical: Not on file   Tobacco Use   ???  Smoking status: Former Smoker     Packs/day: 1.00     Years: 31.00     Pack years: 31.00     Last attempt to quit: 07/06/2012     Years since quitting: 5.6   ??? Smokeless tobacco: Never Used   Substance and Sexual Activity   ??? Alcohol use: Not Currently     Alcohol/week: 0.0 standard drinks     Comment: rare use. beer.   ??? Drug use: No   ??? Sexual activity: Not on file   Lifestyle   ??? Physical activity     Days per week: Not on file     Minutes per session: Not on file   ??? Stress: Not on file   Relationships   ??? Social Wellsite geologist on phone: Not on file     Gets together: Not on file     Attends religious service: Not on file     Active member of club or organization: Not on file     Attends meetings of clubs or organizations: Not on file     Relationship status: Not on file   Other Topics Concern   ??? Not on file   Social History Narrative    Due to prior employment, he lived in [place] from 2008-2010; also lived in [place] in 2007.  He is divorced x 12 years and currently is in a monogamous relationship with a girlfriend. His girlfriend is in rehab recovering from alcohol abuse.  She has been there for over a year.  She is due to soon be released. He finds her to be a good support but does not want to have her serve as caregiver to avoid putting uneccesary stress during this fragile point in her recovery.  He has worked full time during most of his illness as he does not have sick time and does not get paid if he is not working.          FAMILY HISTORY:  Family History   Problem Relation Age of Onset   ??? Aneurysm Mother         AAA   ??? Obesity Mother    ??? Diabetes Father    ??? Cancer Father         Hodgkins   ??? Cancer Brother         unknown primary   ??? Cancer Maternal Uncle         bone. Not sure of primary or mets       VITAL SIGNS:  Blood pressure 126/83, pulse 96, temperature 36.8 ??C (98.2 ??F), temperature source Temporal, weight 72.1 kg (159 lb).    PHYSICAL EXAMINATION:  Constitutional:   Alert, oriented x 3, no acute distress, well nourished, and well hydrated.   Mental Status:   Thought organized, appropriate affect, pleasantly interactive, not anxious appearing.   HEENT:   PERRL, conjunctiva clear, anicteric, oropharynx clear, neck supple, no LAD.   Respiratory: Clear to auscultation, unlabored breathing.     Cardiac: Euvolemic, regular rate and rhythm, normal S1 and S2, no murmur.     Abdomen: Soft, normal bowel sounds, non-distended, non-tender, no organomegaly or masses.     Perianal/Rectal Exam Not performed.     Extremities:   No edema, well perfused.   Musculoskeletal: No joint swelling or tenderness noted, no deformities.     Skin: No rashes, jaundice or skin lesions noted.     Neuro: No focal deficits.

## 2018-02-23 NOTE — Unmapped (Signed)
I will add labs on to your next blood draw  I will refer you to nutrition.    Keep taking creon at current dose. In two weeks, you can increase the dose (to 4 tabs w/ meals and 3 tabs w/ snacks) if you are still having frequent loose stools.       Janeal Holmes, M.D.  Fellow, Gastroenterology    [id number]  [address]  [place], [place] [address]    Nurse Contact: Lytle Butte  Phone: [phone]  Fax: [phone]    Important Contact Numbers:    GI Clinic Appointments:   [phone]  Please call the GI Clinic appointment line if you need to schedule, reschedule or cancel an appointment in clinic.  They can also answer any questions you may have about where your appointment is located and when you need to arrive.      GI Procedure Appointments: [phone]  Please call the GI Procedures line if you need to schedule, reschedule or cancel ANY type of procedure (EGD, colonoscopy, motility testing, etc).  You can also call this number for prep instructions, etc.      Radiology: [phone], option 3 or 4  If you are being scheduled for any type of radiology, you will need to call to receive your appointment time.  Please call this number for information.     For emergencies after normal business hours or on weekends/holidays   Proceed to the nearest emergency room OR contact the [facility] Operator at [phone] who can page the Gastroenterology Fellow on call.

## 2018-03-12 NOTE — Unmapped (Signed)
Zachary Mathis has set up his Creon delivery through [facility].  He has already received his first shipment and will no longer need [facility] to deliver medications.  He is aware that he may give [place] a call if his insurance changes and needs our services in the future.    I am disenrolling patient from the Bone Marrow Transplant specialty queue at this time.    Zachary Mathis, PharmD

## 2018-03-15 DIAGNOSIS — Z9481 Bone marrow transplant status: Principal | ICD-10-CM

## 2018-03-17 ENCOUNTER — Other Ambulatory Visit: Admit: 2018-03-17 | Discharge: 2018-03-18 | Payer: PRIVATE HEALTH INSURANCE

## 2018-03-17 ENCOUNTER — Ambulatory Visit
Admit: 2018-03-17 | Discharge: 2018-03-18 | Payer: PRIVATE HEALTH INSURANCE | Attending: Hematology & Oncology | Primary: Hematology & Oncology

## 2018-03-17 DIAGNOSIS — E785 Hyperlipidemia, unspecified: Principal | ICD-10-CM

## 2018-03-17 DIAGNOSIS — C8190 Hodgkin lymphoma, unspecified, unspecified site: Principal | ICD-10-CM

## 2018-03-17 DIAGNOSIS — I10 Essential (primary) hypertension: Principal | ICD-10-CM

## 2018-03-17 DIAGNOSIS — Z9484 Stem cells transplant status: Principal | ICD-10-CM

## 2018-03-17 DIAGNOSIS — Z872 Personal history of diseases of the skin and subcutaneous tissue: Principal | ICD-10-CM

## 2018-03-17 DIAGNOSIS — C8120 Mixed cellularity classical Hodgkin lymphoma, unspecified site: Principal | ICD-10-CM

## 2018-03-17 DIAGNOSIS — D539 Nutritional anemia, unspecified: Principal | ICD-10-CM

## 2018-03-17 DIAGNOSIS — I427 Cardiomyopathy due to drug and external agent: Principal | ICD-10-CM

## 2018-03-17 DIAGNOSIS — Z9481 Bone marrow transplant status: Principal | ICD-10-CM

## 2018-03-17 DIAGNOSIS — Z794 Long term (current) use of insulin: Principal | ICD-10-CM

## 2018-03-17 DIAGNOSIS — R51 Headache: Principal | ICD-10-CM

## 2018-03-17 DIAGNOSIS — K8689 Other specified diseases of pancreas: Principal | ICD-10-CM

## 2018-03-17 DIAGNOSIS — R911 Solitary pulmonary nodule: Principal | ICD-10-CM

## 2018-03-17 DIAGNOSIS — R197 Diarrhea, unspecified: Principal | ICD-10-CM

## 2018-03-17 DIAGNOSIS — F329 Major depressive disorder, single episode, unspecified: Principal | ICD-10-CM

## 2018-03-17 DIAGNOSIS — H04123 Dry eye syndrome of bilateral lacrimal glands: Principal | ICD-10-CM

## 2018-03-17 DIAGNOSIS — J8410 Pulmonary fibrosis, unspecified: Principal | ICD-10-CM

## 2018-03-17 DIAGNOSIS — E669 Obesity, unspecified: Principal | ICD-10-CM

## 2018-03-17 DIAGNOSIS — D899 Disorder involving the immune mechanism, unspecified: Principal | ICD-10-CM

## 2018-03-17 DIAGNOSIS — I7090 Unspecified atherosclerosis: Principal | ICD-10-CM

## 2018-03-17 DIAGNOSIS — T451X5A Adverse effect of antineoplastic and immunosuppressive drugs, initial encounter: Principal | ICD-10-CM

## 2018-03-17 DIAGNOSIS — I728 Aneurysm of other specified arteries: Principal | ICD-10-CM

## 2018-03-17 DIAGNOSIS — H5710 Ocular pain, unspecified eye: Principal | ICD-10-CM

## 2018-03-17 DIAGNOSIS — E039 Hypothyroidism, unspecified: Principal | ICD-10-CM

## 2018-03-17 DIAGNOSIS — E119 Type 2 diabetes mellitus without complications: Principal | ICD-10-CM

## 2018-03-17 DIAGNOSIS — D89811 Chronic graft-versus-host disease: Principal | ICD-10-CM

## 2018-03-17 DIAGNOSIS — J302 Other seasonal allergic rhinitis: Principal | ICD-10-CM

## 2018-03-17 MED ORDER — OMEGA-3 ACID ETHYL ESTERS 1 GRAM CAPSULE
ORAL_CAPSULE | Freq: Two times a day (BID) | ORAL | 5 refills | 0.00000 days | Status: CP
Start: 2018-03-17 — End: ?

## 2018-05-19 ENCOUNTER — Ambulatory Visit
Admit: 2018-05-19 | Discharge: 2018-05-20 | Payer: PRIVATE HEALTH INSURANCE | Attending: Hematology & Oncology | Primary: Hematology & Oncology

## 2018-05-19 ENCOUNTER — Other Ambulatory Visit: Admit: 2018-05-19 | Discharge: 2018-05-20 | Payer: PRIVATE HEALTH INSURANCE

## 2018-05-19 DIAGNOSIS — D899 Disorder involving the immune mechanism, unspecified: Secondary | ICD-10-CM

## 2018-05-19 DIAGNOSIS — Z9481 Bone marrow transplant status: Secondary | ICD-10-CM

## 2018-05-19 DIAGNOSIS — K8689 Other specified diseases of pancreas: Secondary | ICD-10-CM

## 2018-05-19 DIAGNOSIS — C8120 Mixed cellularity classical Hodgkin lymphoma, unspecified site: Principal | ICD-10-CM

## 2018-05-19 DIAGNOSIS — Z9484 Stem cells transplant status: Secondary | ICD-10-CM

## 2018-05-19 DIAGNOSIS — D89811 Chronic graft-versus-host disease: Secondary | ICD-10-CM

## 2018-05-19 MED ORDER — TACROLIMUS 0.5 MG CAPSULE
ORAL_CAPSULE | 6 refills | 0 days
Start: 2018-05-19 — End: 2018-08-30

## 2018-07-07 ENCOUNTER — Ambulatory Visit: Admit: 2018-07-07 | Discharge: 2018-07-08 | Payer: PRIVATE HEALTH INSURANCE

## 2018-07-07 DIAGNOSIS — D89811 Chronic graft-versus-host disease: Secondary | ICD-10-CM

## 2018-07-07 DIAGNOSIS — H16223 Keratoconjunctivitis sicca, not specified as Sjogren's, bilateral: Principal | ICD-10-CM

## 2018-07-14 ENCOUNTER — Ambulatory Visit
Admit: 2018-07-14 | Discharge: 2018-07-15 | Payer: PRIVATE HEALTH INSURANCE | Attending: Hematology & Oncology | Primary: Hematology & Oncology

## 2018-07-14 ENCOUNTER — Other Ambulatory Visit: Admit: 2018-07-14 | Discharge: 2018-07-15 | Payer: PRIVATE HEALTH INSURANCE

## 2018-07-14 DIAGNOSIS — Z9481 Bone marrow transplant status: Secondary | ICD-10-CM

## 2018-07-14 DIAGNOSIS — C8120 Mixed cellularity classical Hodgkin lymphoma, unspecified site: Secondary | ICD-10-CM

## 2018-07-14 DIAGNOSIS — D899 Disorder involving the immune mechanism, unspecified: Secondary | ICD-10-CM

## 2018-07-14 DIAGNOSIS — D89813 Graft-versus-host disease, unspecified: Secondary | ICD-10-CM

## 2018-07-14 DIAGNOSIS — Z794 Long term (current) use of insulin: Secondary | ICD-10-CM

## 2018-07-14 DIAGNOSIS — E119 Type 2 diabetes mellitus without complications: Secondary | ICD-10-CM

## 2018-08-11 MED ORDER — LANTUS SOLOSTAR U-100 INSULIN 100 UNIT/ML (3 ML) SUBCUTANEOUS PEN
3 refills | 0 days | Status: CP
Start: 2018-08-11 — End: ?

## 2018-08-30 ENCOUNTER — Ambulatory Visit
Admit: 2018-08-30 | Discharge: 2018-08-30 | Payer: PRIVATE HEALTH INSURANCE | Attending: Hematology & Oncology | Primary: Hematology & Oncology

## 2018-08-30 ENCOUNTER — Other Ambulatory Visit: Admit: 2018-08-30 | Discharge: 2018-08-30 | Payer: PRIVATE HEALTH INSURANCE

## 2018-08-30 DIAGNOSIS — C8120 Mixed cellularity classical Hodgkin lymphoma, unspecified site: Secondary | ICD-10-CM

## 2018-08-30 DIAGNOSIS — Z9481 Bone marrow transplant status: Principal | ICD-10-CM

## 2018-08-30 DIAGNOSIS — T8609 Other complications of bone marrow transplant: Principal | ICD-10-CM

## 2018-08-30 DIAGNOSIS — Z9484 Stem cells transplant status: Secondary | ICD-10-CM

## 2018-08-30 DIAGNOSIS — D89813 Graft-versus-host disease, unspecified: Secondary | ICD-10-CM

## 2018-08-30 DIAGNOSIS — D899 Disorder involving the immune mechanism, unspecified: Secondary | ICD-10-CM

## 2018-08-30 MED ORDER — ISAVUCONAZONIUM SULFATE 186 MG CAPSULE
Freq: Every day | ORAL | 5 refills | 28 days | Status: CP
Start: 2018-08-30 — End: 2018-08-31

## 2018-08-30 MED ORDER — RUXOLITINIB 10 MG TABLET
ORAL_TABLET | Freq: Two times a day (BID) | ORAL | 5 refills | 30.00000 days | Status: CP
Start: 2018-08-30 — End: 2018-09-29

## 2018-08-30 MED ORDER — CEFDINIR 300 MG CAPSULE
ORAL_CAPSULE | 11 refills | 0 days | Status: CP
Start: 2018-08-30 — End: 2018-08-30

## 2018-08-30 MED ORDER — TACROLIMUS 0.5 MG CAPSULE
ORAL_CAPSULE | Freq: Two times a day (BID) | ORAL | 6 refills | 30 days | Status: CP
Start: 2018-08-30 — End: ?

## 2018-08-30 MED ORDER — PANTOPRAZOLE 40 MG TABLET,DELAYED RELEASE
ORAL_TABLET | Freq: Two times a day (BID) | ORAL | 5 refills | 30.00000 days | Status: CP
Start: 2018-08-30 — End: 2018-09-02

## 2018-09-01 MED ORDER — SULFAMETHOXAZOLE 800 MG-TRIMETHOPRIM 160 MG TABLET
ORAL_TABLET | ORAL | 5 refills | 28 days | Status: CP
Start: 2018-09-01 — End: ?

## 2018-09-03 MED ORDER — PANTOPRAZOLE 40 MG TABLET,DELAYED RELEASE
ORAL_TABLET | Freq: Two times a day (BID) | ORAL | 5 refills | 30.00000 days | Status: CP
Start: 2018-09-03 — End: ?

## 2018-09-07 ENCOUNTER — Other Ambulatory Visit: Admit: 2018-09-07 | Discharge: 2018-09-08 | Payer: PRIVATE HEALTH INSURANCE

## 2018-09-07 DIAGNOSIS — Z9481 Bone marrow transplant status: Principal | ICD-10-CM

## 2018-09-07 DIAGNOSIS — Z9484 Stem cells transplant status: Secondary | ICD-10-CM

## 2018-10-12 MED ORDER — METFORMIN 500 MG TABLET
ORAL_TABLET | Freq: Two times a day (BID) | ORAL | 11 refills | 30 days | Status: CP
Start: 2018-10-12 — End: 2019-10-12

## 2018-10-12 MED ORDER — SERTRALINE 100 MG TABLET
ORAL_TABLET | Freq: Every day | ORAL | 11 refills | 30 days | Status: CP
Start: 2018-10-12 — End: ?

## 2018-10-15 DIAGNOSIS — D899 Disorder involving the immune mechanism, unspecified: Secondary | ICD-10-CM

## 2018-10-15 DIAGNOSIS — D89811 Chronic graft-versus-host disease: Secondary | ICD-10-CM

## 2018-10-15 DIAGNOSIS — Z9481 Bone marrow transplant status: Secondary | ICD-10-CM

## 2018-10-18 ENCOUNTER — Encounter
Admit: 2018-10-18 | Discharge: 2018-10-19 | Payer: PRIVATE HEALTH INSURANCE | Attending: Hematology & Oncology | Primary: Hematology & Oncology

## 2018-10-18 ENCOUNTER — Encounter: Admit: 2018-10-18 | Discharge: 2018-10-19 | Payer: PRIVATE HEALTH INSURANCE

## 2018-10-18 DIAGNOSIS — H5710 Ocular pain, unspecified eye: Secondary | ICD-10-CM

## 2018-10-18 DIAGNOSIS — D89813 Graft-versus-host disease, unspecified: Secondary | ICD-10-CM

## 2018-10-18 DIAGNOSIS — D89811 Chronic graft-versus-host disease: Secondary | ICD-10-CM

## 2018-10-18 DIAGNOSIS — I10 Essential (primary) hypertension: Secondary | ICD-10-CM

## 2018-10-18 DIAGNOSIS — Z9484 Stem cells transplant status: Secondary | ICD-10-CM

## 2018-10-18 DIAGNOSIS — Z9481 Bone marrow transplant status: Secondary | ICD-10-CM

## 2018-10-18 DIAGNOSIS — D849 Immunodeficiency, unspecified: Secondary | ICD-10-CM

## 2018-10-18 DIAGNOSIS — C8190 Hodgkin lymphoma, unspecified, unspecified site: Secondary | ICD-10-CM

## 2018-10-18 DIAGNOSIS — Z79899 Other long term (current) drug therapy: Secondary | ICD-10-CM

## 2018-10-18 DIAGNOSIS — D899 Disorder involving the immune mechanism, unspecified: Secondary | ICD-10-CM

## 2018-10-18 DIAGNOSIS — E1165 Type 2 diabetes mellitus with hyperglycemia: Secondary | ICD-10-CM

## 2018-10-18 DIAGNOSIS — T8609 Other complications of bone marrow transplant: Secondary | ICD-10-CM

## 2018-10-18 DIAGNOSIS — H16223 Keratoconjunctivitis sicca, not specified as Sjogren's, bilateral: Secondary | ICD-10-CM

## 2018-10-18 DIAGNOSIS — E039 Hypothyroidism, unspecified: Secondary | ICD-10-CM

## 2018-10-18 DIAGNOSIS — D539 Nutritional anemia, unspecified: Secondary | ICD-10-CM

## 2018-10-18 DIAGNOSIS — Z23 Encounter for immunization: Secondary | ICD-10-CM

## 2018-10-18 DIAGNOSIS — Z794 Long term (current) use of insulin: Secondary | ICD-10-CM

## 2018-10-18 MED ORDER — PANTOPRAZOLE 40 MG TABLET,DELAYED RELEASE
ORAL_TABLET | Freq: Two times a day (BID) | ORAL | 5 refills | 30.00000 days | Status: CP
Start: 2018-10-18 — End: 2018-10-18

## 2018-10-18 MED ORDER — PANTOPRAZOLE 40 MG TABLET,DELAYED RELEASE: 40 mg | tablet | Freq: Two times a day (BID) | 5 refills | 30 days | Status: AC

## 2018-12-07 DIAGNOSIS — D89813 Graft-versus-host disease, unspecified: Principal | ICD-10-CM

## 2018-12-07 DIAGNOSIS — T8609 Other complications of bone marrow transplant: Principal | ICD-10-CM

## 2018-12-08 MED ORDER — AMLODIPINE 5 MG TABLET
ORAL_TABLET | Freq: Every day | ORAL | 11 refills | 30.00000 days | Status: CP
Start: 2018-12-08 — End: 2019-12-08

## 2018-12-08 MED ORDER — VALACYCLOVIR 500 MG TABLET
ORAL_TABLET | Freq: Every day | ORAL | 11 refills | 30.00000 days | Status: CP
Start: 2018-12-08 — End: ?

## 2018-12-20 ENCOUNTER — Other Ambulatory Visit: Admit: 2018-12-20 | Discharge: 2018-12-21 | Payer: PRIVATE HEALTH INSURANCE

## 2018-12-20 ENCOUNTER — Ambulatory Visit
Admit: 2018-12-20 | Discharge: 2018-12-21 | Payer: PRIVATE HEALTH INSURANCE | Attending: Hematology & Oncology | Primary: Hematology & Oncology

## 2018-12-20 DIAGNOSIS — D89811 Chronic graft-versus-host disease: Principal | ICD-10-CM

## 2018-12-20 DIAGNOSIS — D899 Disorder involving the immune mechanism, unspecified: Principal | ICD-10-CM

## 2018-12-20 DIAGNOSIS — Z9484 Stem cells transplant status: Principal | ICD-10-CM

## 2018-12-20 DIAGNOSIS — Z794 Long term (current) use of insulin: Secondary | ICD-10-CM

## 2018-12-20 DIAGNOSIS — Z9481 Bone marrow transplant status: Principal | ICD-10-CM

## 2018-12-20 DIAGNOSIS — H169 Unspecified keratitis: Principal | ICD-10-CM

## 2018-12-20 DIAGNOSIS — E119 Type 2 diabetes mellitus without complications: Principal | ICD-10-CM

## 2018-12-20 DIAGNOSIS — C8120 Mixed cellularity classical Hodgkin lymphoma, unspecified site: Principal | ICD-10-CM

## 2018-12-20 DIAGNOSIS — E039 Hypothyroidism, unspecified: Principal | ICD-10-CM

## 2018-12-26 DIAGNOSIS — D89811 Chronic graft-versus-host disease: Principal | ICD-10-CM

## 2018-12-26 DIAGNOSIS — Z9481 Bone marrow transplant status: Principal | ICD-10-CM

## 2019-01-03 ENCOUNTER — Other Ambulatory Visit: Admit: 2019-01-03 | Discharge: 2019-01-04 | Payer: PRIVATE HEALTH INSURANCE

## 2019-01-03 DIAGNOSIS — D89811 Chronic graft-versus-host disease: Principal | ICD-10-CM

## 2019-01-03 DIAGNOSIS — Z9481 Bone marrow transplant status: Principal | ICD-10-CM

## 2019-01-04 DIAGNOSIS — Z9481 Bone marrow transplant status: Principal | ICD-10-CM

## 2019-01-04 DIAGNOSIS — D89811 Chronic graft-versus-host disease: Principal | ICD-10-CM

## 2019-01-11 DIAGNOSIS — D89811 Chronic graft-versus-host disease: Principal | ICD-10-CM

## 2019-01-11 DIAGNOSIS — Z9484 Stem cells transplant status: Principal | ICD-10-CM

## 2019-01-12 DIAGNOSIS — Z9481 Bone marrow transplant status: Principal | ICD-10-CM

## 2019-01-13 ENCOUNTER — Other Ambulatory Visit: Admit: 2019-01-13 | Discharge: 2019-01-14 | Payer: PRIVATE HEALTH INSURANCE

## 2019-01-13 ENCOUNTER — Ambulatory Visit
Admit: 2019-01-13 | Discharge: 2019-01-14 | Payer: PRIVATE HEALTH INSURANCE | Attending: Primary Care | Primary: Primary Care

## 2019-01-13 DIAGNOSIS — E119 Type 2 diabetes mellitus without complications: Secondary | ICD-10-CM

## 2019-01-13 DIAGNOSIS — D89811 Chronic graft-versus-host disease: Principal | ICD-10-CM

## 2019-01-13 DIAGNOSIS — Z794 Long term (current) use of insulin: Principal | ICD-10-CM

## 2019-01-13 DIAGNOSIS — Z9481 Bone marrow transplant status: Principal | ICD-10-CM

## 2019-01-13 DIAGNOSIS — R748 Abnormal levels of other serum enzymes: Principal | ICD-10-CM

## 2019-01-13 DIAGNOSIS — N179 Acute kidney failure, unspecified: Principal | ICD-10-CM

## 2019-01-13 MED ORDER — TACROLIMUS 0.5 MG CAPSULE
ORAL_CAPSULE | 6 refills | 0 days | Status: CP
Start: 2019-01-13 — End: ?

## 2019-01-13 MED ORDER — RUXOLITINIB 10 MG TABLET
ORAL_TABLET | Freq: Two times a day (BID) | ORAL | 6 refills | 30.00000 days
Start: 2019-01-13 — End: 2019-02-12

## 2019-01-13 MED ORDER — LANTUS SOLOSTAR U-100 INSULIN 100 UNIT/ML (3 ML) SUBCUTANEOUS PEN
Freq: Every day | SUBCUTANEOUS | 3 refills | 30 days | Status: CP
Start: 2019-01-13 — End: 2019-02-12

## 2019-01-27 ENCOUNTER — Ambulatory Visit
Admit: 2019-01-27 | Discharge: 2019-01-28 | Payer: PRIVATE HEALTH INSURANCE | Attending: Primary Care | Primary: Primary Care

## 2019-01-27 ENCOUNTER — Ambulatory Visit: Admit: 2019-01-27 | Discharge: 2019-01-28 | Payer: PRIVATE HEALTH INSURANCE

## 2019-01-27 ENCOUNTER — Other Ambulatory Visit: Admit: 2019-01-27 | Discharge: 2019-01-28 | Payer: PRIVATE HEALTH INSURANCE

## 2019-01-27 DIAGNOSIS — Z9481 Bone marrow transplant status: Principal | ICD-10-CM

## 2019-01-27 DIAGNOSIS — Z9484 Stem cells transplant status: Principal | ICD-10-CM

## 2019-01-27 DIAGNOSIS — Z794 Long term (current) use of insulin: Secondary | ICD-10-CM

## 2019-01-27 DIAGNOSIS — R945 Abnormal results of liver function studies: Principal | ICD-10-CM

## 2019-01-27 DIAGNOSIS — E119 Type 2 diabetes mellitus without complications: Principal | ICD-10-CM

## 2019-01-27 DIAGNOSIS — C8120 Mixed cellularity classical Hodgkin lymphoma, unspecified site: Principal | ICD-10-CM

## 2019-01-27 DIAGNOSIS — D899 Disorder involving the immune mechanism, unspecified: Principal | ICD-10-CM

## 2019-01-27 DIAGNOSIS — D89811 Chronic graft-versus-host disease: Principal | ICD-10-CM

## 2019-02-07 DIAGNOSIS — D89811 Chronic graft-versus-host disease: Principal | ICD-10-CM

## 2019-02-14 DIAGNOSIS — D89813 Graft-versus-host disease, unspecified: Principal | ICD-10-CM

## 2019-02-14 DIAGNOSIS — D89811 Chronic graft-versus-host disease: Principal | ICD-10-CM

## 2019-02-14 MED ORDER — RUXOLITINIB 10 MG TABLET
ORAL_TABLET | Freq: Two times a day (BID) | ORAL | 6 refills | 30 days | Status: CP
Start: 2019-02-14 — End: 2019-03-16

## 2019-02-15 DIAGNOSIS — D89813 Graft-versus-host disease, unspecified: Principal | ICD-10-CM

## 2019-02-15 DIAGNOSIS — T8609 Other complications of bone marrow transplant: Principal | ICD-10-CM

## 2019-02-16 DIAGNOSIS — Z9481 Bone marrow transplant status: Principal | ICD-10-CM

## 2019-02-17 ENCOUNTER — Ambulatory Visit: Admit: 2019-02-17 | Discharge: 2019-02-17 | Payer: PRIVATE HEALTH INSURANCE

## 2019-02-17 ENCOUNTER — Other Ambulatory Visit: Admit: 2019-02-17 | Discharge: 2019-02-17 | Payer: PRIVATE HEALTH INSURANCE

## 2019-02-17 DIAGNOSIS — R1011 Right upper quadrant pain: Principal | ICD-10-CM

## 2019-02-17 DIAGNOSIS — Z9481 Bone marrow transplant status: Principal | ICD-10-CM

## 2019-02-17 DIAGNOSIS — D89811 Chronic graft-versus-host disease: Principal | ICD-10-CM

## 2019-02-17 MED ORDER — TACROLIMUS 0.5 MG CAPSULE
ORAL_CAPSULE | 6 refills | 0 days | Status: CP
Start: 2019-02-17 — End: ?

## 2019-02-22 MED ORDER — ACYCLOVIR 400 MG TABLET
ORAL_TABLET | 0 refills | 0 days | Status: CP
Start: 2019-02-22 — End: ?

## 2019-02-25 DIAGNOSIS — D89811 Chronic graft-versus-host disease: Principal | ICD-10-CM

## 2019-02-25 MED ORDER — TACROLIMUS 0.5 MG CAPSULE
ORAL_CAPSULE | ORAL | 6 refills | 0.00000 days | Status: CP
Start: 2019-02-25 — End: 2019-02-25

## 2019-02-25 MED ORDER — TACROLIMUS 0.5 MG CAPSULE: capsule | 6 refills | 0 days | Status: AC

## 2019-02-28 ENCOUNTER — Telehealth
Admit: 2019-02-28 | Discharge: 2019-03-01 | Payer: PRIVATE HEALTH INSURANCE | Attending: Gastroenterology | Primary: Gastroenterology

## 2019-03-02 DIAGNOSIS — Z9484 Stem cells transplant status: Principal | ICD-10-CM

## 2019-03-02 DIAGNOSIS — Z9481 Bone marrow transplant status: Principal | ICD-10-CM

## 2019-03-04 DIAGNOSIS — T8609 Other complications of bone marrow transplant: Principal | ICD-10-CM

## 2019-03-04 DIAGNOSIS — D89813 Graft-versus-host disease, unspecified: Principal | ICD-10-CM

## 2019-03-04 MED ORDER — ACYCLOVIR 400 MG TABLET
ORAL_TABLET | Freq: Two times a day (BID) | ORAL | 2 refills | 90.00000 days | Status: CP
Start: 2019-03-04 — End: ?

## 2019-03-04 MED ORDER — SULFAMETHOXAZOLE 800 MG-TRIMETHOPRIM 160 MG TABLET
ORAL_TABLET | ORAL | 5 refills | 28.00000 days | Status: CP
Start: 2019-03-04 — End: ?

## 2019-03-07 ENCOUNTER — Other Ambulatory Visit: Admit: 2019-03-07 | Discharge: 2019-03-08 | Payer: PRIVATE HEALTH INSURANCE

## 2019-03-07 ENCOUNTER — Ambulatory Visit: Admit: 2019-03-07 | Discharge: 2019-03-08 | Payer: PRIVATE HEALTH INSURANCE

## 2019-03-07 DIAGNOSIS — Z9481 Bone marrow transplant status: Principal | ICD-10-CM

## 2019-03-07 DIAGNOSIS — Z9484 Stem cells transplant status: Principal | ICD-10-CM

## 2019-03-07 DIAGNOSIS — D89811 Chronic graft-versus-host disease: Principal | ICD-10-CM

## 2019-03-07 DIAGNOSIS — D899 Disorder involving the immune mechanism, unspecified: Principal | ICD-10-CM

## 2019-03-07 DIAGNOSIS — K769 Liver disease, unspecified: Principal | ICD-10-CM

## 2019-03-07 MED ORDER — TACROLIMUS 0.5 MG CAPSULE
ORAL_CAPSULE | Freq: Two times a day (BID) | ORAL | 6 refills | 30.00000 days
Start: 2019-03-07 — End: ?

## 2019-03-22 ENCOUNTER — Ambulatory Visit: Admit: 2019-03-22 | Discharge: 2019-03-23 | Payer: PRIVATE HEALTH INSURANCE

## 2019-03-28 ENCOUNTER — Ambulatory Visit
Admit: 2019-03-28 | Discharge: 2019-03-29 | Payer: PRIVATE HEALTH INSURANCE | Attending: Hematology & Oncology | Primary: Hematology & Oncology

## 2019-03-28 ENCOUNTER — Other Ambulatory Visit: Admit: 2019-03-28 | Discharge: 2019-03-29 | Payer: PRIVATE HEALTH INSURANCE

## 2019-03-28 DIAGNOSIS — D899 Disorder involving the immune mechanism, unspecified: Principal | ICD-10-CM

## 2019-03-28 DIAGNOSIS — C8120 Mixed cellularity classical Hodgkin lymphoma, unspecified site: Principal | ICD-10-CM

## 2019-03-28 DIAGNOSIS — D89813 Graft-versus-host disease, unspecified: Principal | ICD-10-CM

## 2019-03-28 DIAGNOSIS — H169 Unspecified keratitis: Principal | ICD-10-CM

## 2019-03-28 DIAGNOSIS — D89811 Chronic graft-versus-host disease: Principal | ICD-10-CM

## 2019-03-28 DIAGNOSIS — Z794 Long term (current) use of insulin: Secondary | ICD-10-CM

## 2019-03-28 DIAGNOSIS — Z9481 Bone marrow transplant status: Principal | ICD-10-CM

## 2019-03-28 DIAGNOSIS — E1169 Type 2 diabetes mellitus with other specified complication: Principal | ICD-10-CM

## 2019-03-28 DIAGNOSIS — K8689 Other specified diseases of pancreas: Principal | ICD-10-CM

## 2019-03-28 MED ORDER — TACROLIMUS 0.5 MG CAPSULE, IMMEDIATE-RELEASE
ORAL_CAPSULE | Freq: Two times a day (BID) | ORAL | 2 refills | 30.00000 days
Start: 2019-03-28 — End: ?

## 2019-03-30 ENCOUNTER — Ambulatory Visit: Admit: 2019-03-30 | Discharge: 2019-03-30 | Payer: PRIVATE HEALTH INSURANCE

## 2019-03-30 DIAGNOSIS — K769 Liver disease, unspecified: Principal | ICD-10-CM

## 2019-04-08 DIAGNOSIS — R748 Abnormal levels of other serum enzymes: Principal | ICD-10-CM

## 2019-04-25 ENCOUNTER — Ambulatory Visit: Admit: 2019-04-25 | Discharge: 2019-04-26 | Payer: PRIVATE HEALTH INSURANCE

## 2019-05-30 ENCOUNTER — Ambulatory Visit: Admit: 2019-05-30 | Payer: PRIVATE HEALTH INSURANCE | Attending: Gastroenterology | Primary: Gastroenterology

## 2019-06-19 DIAGNOSIS — Z9481 Bone marrow transplant status: Principal | ICD-10-CM

## 2019-06-19 DIAGNOSIS — T8699 Other complications of unspecified transplanted organ and tissue: Principal | ICD-10-CM

## 2019-06-19 DIAGNOSIS — D89813 Graft-versus-host disease, unspecified: Principal | ICD-10-CM

## 2019-06-20 ENCOUNTER — Other Ambulatory Visit: Admit: 2019-06-20 | Discharge: 2019-06-21 | Payer: PRIVATE HEALTH INSURANCE

## 2019-06-20 ENCOUNTER — Ambulatory Visit
Admit: 2019-06-20 | Discharge: 2019-06-21 | Payer: PRIVATE HEALTH INSURANCE | Attending: Hematology & Oncology | Primary: Hematology & Oncology

## 2019-06-20 DIAGNOSIS — M3501 Sicca syndrome with keratoconjunctivitis: Principal | ICD-10-CM

## 2019-06-20 DIAGNOSIS — Z9481 Bone marrow transplant status: Principal | ICD-10-CM

## 2019-06-20 DIAGNOSIS — D89813 Graft-versus-host disease, unspecified: Principal | ICD-10-CM

## 2019-06-20 DIAGNOSIS — T8609 Other complications of bone marrow transplant: Principal | ICD-10-CM

## 2019-06-20 DIAGNOSIS — D849 Immunodeficiency, unspecified: Principal | ICD-10-CM

## 2019-06-20 DIAGNOSIS — Z794 Long term (current) use of insulin: Principal | ICD-10-CM

## 2019-06-20 DIAGNOSIS — D89811 Chronic graft-versus-host disease: Principal | ICD-10-CM

## 2019-06-20 DIAGNOSIS — C8120 Mixed cellularity classical Hodgkin lymphoma, unspecified site: Principal | ICD-10-CM

## 2019-06-20 DIAGNOSIS — T8699 Other complications of unspecified transplanted organ and tissue: Principal | ICD-10-CM

## 2019-06-20 DIAGNOSIS — E119 Type 2 diabetes mellitus without complications: Principal | ICD-10-CM

## 2019-06-20 MED ORDER — TACROLIMUS 0.5 MG CAPSULE, IMMEDIATE-RELEASE: 5 mg | capsule | Freq: Two times a day (BID) | 2 refills | 30 days

## 2019-06-20 MED ORDER — TACROLIMUS 0.5 MG CAPSULE, IMMEDIATE-RELEASE
ORAL_CAPSULE | Freq: Two times a day (BID) | ORAL | 2 refills | 30.00000 days
Start: 2019-06-20 — End: 2019-06-20

## 2019-06-27 ENCOUNTER — Other Ambulatory Visit: Admit: 2019-06-27 | Discharge: 2019-06-28 | Payer: PRIVATE HEALTH INSURANCE

## 2019-06-27 DIAGNOSIS — Z9481 Bone marrow transplant status: Principal | ICD-10-CM

## 2019-07-04 ENCOUNTER — Other Ambulatory Visit: Admit: 2019-07-04 | Discharge: 2019-07-05 | Payer: PRIVATE HEALTH INSURANCE

## 2019-07-04 DIAGNOSIS — Z9481 Bone marrow transplant status: Principal | ICD-10-CM

## 2019-07-11 ENCOUNTER — Other Ambulatory Visit: Admit: 2019-07-11 | Discharge: 2019-07-12 | Payer: PRIVATE HEALTH INSURANCE

## 2019-07-11 DIAGNOSIS — Z9481 Bone marrow transplant status: Principal | ICD-10-CM

## 2019-07-19 ENCOUNTER — Ambulatory Visit: Admit: 2019-07-19 | Discharge: 2019-07-19 | Payer: PRIVATE HEALTH INSURANCE

## 2019-07-19 ENCOUNTER — Other Ambulatory Visit: Admit: 2019-07-19 | Discharge: 2019-07-19 | Payer: PRIVATE HEALTH INSURANCE

## 2019-07-19 DIAGNOSIS — D89811 Chronic graft-versus-host disease: Principal | ICD-10-CM

## 2019-07-19 DIAGNOSIS — M810 Age-related osteoporosis without current pathological fracture: Principal | ICD-10-CM

## 2019-07-19 DIAGNOSIS — D849 Immunodeficiency, unspecified: Principal | ICD-10-CM

## 2019-07-19 DIAGNOSIS — Z9484 Stem cells transplant status: Principal | ICD-10-CM

## 2019-07-19 DIAGNOSIS — M8580 Other specified disorders of bone density and structure, unspecified site: Principal | ICD-10-CM

## 2019-07-19 DIAGNOSIS — Z9481 Bone marrow transplant status: Principal | ICD-10-CM

## 2019-07-19 DIAGNOSIS — F329 Major depressive disorder, single episode, unspecified: Principal | ICD-10-CM

## 2019-07-19 DIAGNOSIS — Z794 Long term (current) use of insulin: Secondary | ICD-10-CM

## 2019-07-19 DIAGNOSIS — C8120 Mixed cellularity classical Hodgkin lymphoma, unspecified site: Principal | ICD-10-CM

## 2019-07-19 DIAGNOSIS — C8190 Hodgkin lymphoma, unspecified, unspecified site: Principal | ICD-10-CM

## 2019-07-19 DIAGNOSIS — E119 Type 2 diabetes mellitus without complications: Principal | ICD-10-CM

## 2019-07-19 DIAGNOSIS — R632 Polyphagia: Principal | ICD-10-CM

## 2019-07-19 DIAGNOSIS — Z9289 Personal history of other medical treatment: Principal | ICD-10-CM

## 2019-07-19 DIAGNOSIS — M3501 Sicca syndrome with keratoconjunctivitis: Principal | ICD-10-CM

## 2019-07-19 DIAGNOSIS — E039 Hypothyroidism, unspecified: Principal | ICD-10-CM

## 2019-07-29 DIAGNOSIS — E039 Hypothyroidism, unspecified: Principal | ICD-10-CM

## 2019-08-01 ENCOUNTER — Ambulatory Visit: Admit: 2019-08-01 | Discharge: 2019-08-01 | Payer: PRIVATE HEALTH INSURANCE

## 2019-08-01 ENCOUNTER — Other Ambulatory Visit: Admit: 2019-08-01 | Discharge: 2019-08-01 | Payer: PRIVATE HEALTH INSURANCE

## 2019-08-01 ENCOUNTER — Ambulatory Visit
Admit: 2019-08-01 | Discharge: 2019-08-01 | Payer: PRIVATE HEALTH INSURANCE | Attending: Hematology & Oncology | Primary: Hematology & Oncology

## 2019-08-01 DIAGNOSIS — C8120 Mixed cellularity classical Hodgkin lymphoma, unspecified site: Principal | ICD-10-CM

## 2019-08-01 DIAGNOSIS — E1169 Type 2 diabetes mellitus with other specified complication: Principal | ICD-10-CM

## 2019-08-01 DIAGNOSIS — Z9481 Bone marrow transplant status: Principal | ICD-10-CM

## 2019-08-01 DIAGNOSIS — Z794 Long term (current) use of insulin: Secondary | ICD-10-CM

## 2019-08-01 DIAGNOSIS — D849 Immunodeficiency, unspecified: Principal | ICD-10-CM

## 2019-08-01 DIAGNOSIS — D89811 Chronic graft-versus-host disease: Principal | ICD-10-CM

## 2019-08-01 DIAGNOSIS — E039 Hypothyroidism, unspecified: Principal | ICD-10-CM

## 2019-08-01 DIAGNOSIS — M810 Age-related osteoporosis without current pathological fracture: Principal | ICD-10-CM

## 2019-08-01 MED ORDER — TACROLIMUS 0.5 MG CAPSULE, IMMEDIATE-RELEASE
ORAL_CAPSULE | Freq: Two times a day (BID) | ORAL | 2 refills | 39 days
Start: 2019-08-01 — End: ?

## 2019-08-08 ENCOUNTER — Other Ambulatory Visit: Admit: 2019-08-08 | Discharge: 2019-08-09 | Payer: PRIVATE HEALTH INSURANCE

## 2019-08-08 DIAGNOSIS — Z9481 Bone marrow transplant status: Principal | ICD-10-CM

## 2019-08-15 ENCOUNTER — Other Ambulatory Visit: Admit: 2019-08-15 | Discharge: 2019-08-15 | Payer: PRIVATE HEALTH INSURANCE

## 2019-08-15 DIAGNOSIS — Z9481 Bone marrow transplant status: Principal | ICD-10-CM

## 2019-08-22 ENCOUNTER — Ambulatory Visit: Admit: 2019-08-22 | Payer: PRIVATE HEALTH INSURANCE

## 2019-08-29 ENCOUNTER — Other Ambulatory Visit: Admit: 2019-08-29 | Discharge: 2019-08-30 | Payer: PRIVATE HEALTH INSURANCE

## 2019-08-29 DIAGNOSIS — D89811 Chronic graft-versus-host disease: Principal | ICD-10-CM

## 2019-08-29 DIAGNOSIS — Z9481 Bone marrow transplant status: Principal | ICD-10-CM

## 2019-08-29 MED ORDER — TACROLIMUS 0.5 MG CAPSULE, IMMEDIATE-RELEASE
ORAL_CAPSULE | Freq: Two times a day (BID) | ORAL | 2 refills | 30.00000 days | Status: CP
Start: 2019-08-29 — End: ?

## 2019-08-30 DIAGNOSIS — Z9484 Stem cells transplant status: Principal | ICD-10-CM

## 2019-09-05 ENCOUNTER — Other Ambulatory Visit: Admit: 2019-09-05 | Discharge: 2019-09-06 | Payer: PRIVATE HEALTH INSURANCE

## 2019-09-05 DIAGNOSIS — Z9481 Bone marrow transplant status: Principal | ICD-10-CM

## 2019-09-05 DIAGNOSIS — Z9484 Stem cells transplant status: Principal | ICD-10-CM

## 2019-09-12 ENCOUNTER — Other Ambulatory Visit: Admit: 2019-09-12 | Discharge: 2019-09-13 | Payer: PRIVATE HEALTH INSURANCE

## 2019-09-12 ENCOUNTER — Ambulatory Visit: Admit: 2019-09-12 | Discharge: 2019-09-13 | Payer: PRIVATE HEALTH INSURANCE

## 2019-09-12 DIAGNOSIS — Z9484 Stem cells transplant status: Principal | ICD-10-CM

## 2019-09-12 DIAGNOSIS — D89813 Graft-versus-host disease, unspecified: Principal | ICD-10-CM

## 2019-09-12 DIAGNOSIS — D89811 Chronic graft-versus-host disease: Principal | ICD-10-CM

## 2019-09-12 DIAGNOSIS — Z9481 Bone marrow transplant status: Principal | ICD-10-CM

## 2019-09-12 MED ORDER — SULFAMETHOXAZOLE 800 MG-TRIMETHOPRIM 160 MG TABLET
ORAL_TABLET | ORAL | 5 refills | 28 days | Status: CP
Start: 2019-09-12 — End: ?

## 2019-09-12 MED ORDER — TACROLIMUS 0.5 MG CAPSULE, IMMEDIATE-RELEASE
ORAL_CAPSULE | ORAL | 2 refills | 30 days | Status: CP
Start: 2019-09-12 — End: ?

## 2019-09-12 MED ORDER — RUXOLITINIB 10 MG TABLET
ORAL_TABLET | Freq: Two times a day (BID) | ORAL | 6 refills | 30.00000 days | Status: CP
Start: 2019-09-12 — End: 2019-10-12

## 2019-09-26 ENCOUNTER — Other Ambulatory Visit: Admit: 2019-09-26 | Discharge: 2019-09-27 | Payer: PRIVATE HEALTH INSURANCE

## 2019-09-26 DIAGNOSIS — R7401 Transaminitis: Principal | ICD-10-CM

## 2019-09-26 DIAGNOSIS — Z9484 Stem cells transplant status: Principal | ICD-10-CM

## 2019-09-26 DIAGNOSIS — Z9481 Bone marrow transplant status: Principal | ICD-10-CM

## 2019-09-30 DIAGNOSIS — D89811 Chronic graft-versus-host disease: Principal | ICD-10-CM

## 2019-10-03 ENCOUNTER — Ambulatory Visit
Admit: 2019-10-03 | Discharge: 2019-10-04 | Payer: PRIVATE HEALTH INSURANCE | Attending: Hematology & Oncology | Primary: Hematology & Oncology

## 2019-10-03 ENCOUNTER — Other Ambulatory Visit: Admit: 2019-10-03 | Discharge: 2019-10-04 | Payer: PRIVATE HEALTH INSURANCE

## 2019-10-03 DIAGNOSIS — D89813 Graft-versus-host disease, unspecified: Principal | ICD-10-CM

## 2019-10-03 DIAGNOSIS — E1122 Type 2 diabetes mellitus with diabetic chronic kidney disease: Principal | ICD-10-CM

## 2019-10-03 DIAGNOSIS — N183 Type 2 diabetes mellitus with stage 3 chronic kidney disease, with long-term current use of insulin, unspecified whether stage 3a or 3b CKD (CMS-HCC): Principal | ICD-10-CM

## 2019-10-03 DIAGNOSIS — Z9484 Stem cells transplant status: Principal | ICD-10-CM

## 2019-10-03 DIAGNOSIS — Z9481 Bone marrow transplant status: Principal | ICD-10-CM

## 2019-10-03 DIAGNOSIS — T8609 Other complications of bone marrow transplant: Secondary | ICD-10-CM

## 2019-10-03 DIAGNOSIS — E039 Hypothyroidism, unspecified: Principal | ICD-10-CM

## 2019-10-03 DIAGNOSIS — Z794 Long term (current) use of insulin: Secondary | ICD-10-CM

## 2019-10-03 DIAGNOSIS — C8120 Mixed cellularity classical Hodgkin lymphoma, unspecified site: Principal | ICD-10-CM

## 2019-10-03 DIAGNOSIS — D849 Immunodeficiency, unspecified: Principal | ICD-10-CM

## 2019-10-03 DIAGNOSIS — D89811 Chronic graft-versus-host disease: Principal | ICD-10-CM

## 2019-10-03 MED ORDER — TACROLIMUS 1 MG CAPSULE, IMMEDIATE-RELEASE: 3 mg | capsule | Freq: Two times a day (BID) | 0 refills | 3 days | Status: AC

## 2019-10-03 MED ORDER — ACYCLOVIR 400 MG TABLET
ORAL_TABLET | Freq: Two times a day (BID) | ORAL | 3 refills | 90 days | Status: CP
Start: 2019-10-03 — End: ?

## 2019-10-03 MED ORDER — TACROLIMUS 1 MG CAPSULE, IMMEDIATE-RELEASE
ORAL_CAPSULE | Freq: Two times a day (BID) | ORAL | 0 refills | 3.00000 days | Status: CP
Start: 2019-10-03 — End: 2019-10-06

## 2019-10-07 DIAGNOSIS — D89811 Chronic graft-versus-host disease: Principal | ICD-10-CM

## 2019-10-07 DIAGNOSIS — Z9481 Bone marrow transplant status: Principal | ICD-10-CM

## 2019-10-20 ENCOUNTER — Ambulatory Visit
Admit: 2019-10-20 | Discharge: 2019-10-21 | Payer: PRIVATE HEALTH INSURANCE | Attending: Gastroenterology | Primary: Gastroenterology

## 2019-10-20 DIAGNOSIS — R7401 Transaminitis: Principal | ICD-10-CM

## 2019-10-20 MED ORDER — URSODIOL 300 MG CAPSULE
ORAL_CAPSULE | Freq: Two times a day (BID) | ORAL | 11 refills | 30.00000 days | Status: CP
Start: 2019-10-20 — End: 2020-10-19

## 2019-10-30 MED ORDER — AMLODIPINE 5 MG TABLET
ORAL_TABLET | 3 refills | 0 days | Status: CP
Start: 2019-10-30 — End: ?

## 2019-11-14 ENCOUNTER — Other Ambulatory Visit: Admit: 2019-11-14 | Discharge: 2019-11-14 | Payer: PRIVATE HEALTH INSURANCE

## 2019-11-14 ENCOUNTER — Ambulatory Visit
Admit: 2019-11-14 | Discharge: 2019-11-14 | Payer: PRIVATE HEALTH INSURANCE | Attending: Hematology & Oncology | Primary: Hematology & Oncology

## 2019-11-14 DIAGNOSIS — I10 Essential (primary) hypertension: Principal | ICD-10-CM

## 2019-11-14 DIAGNOSIS — M3501 Sicca syndrome with keratoconjunctivitis: Principal | ICD-10-CM

## 2019-11-14 DIAGNOSIS — Z9481 Bone marrow transplant status: Principal | ICD-10-CM

## 2019-11-14 DIAGNOSIS — Z9484 Stem cells transplant status: Principal | ICD-10-CM

## 2019-11-14 DIAGNOSIS — I2510 Atherosclerotic heart disease of native coronary artery without angina pectoris: Principal | ICD-10-CM

## 2019-11-14 DIAGNOSIS — Z8571 Personal history of Hodgkin lymphoma: Principal | ICD-10-CM

## 2019-11-14 DIAGNOSIS — D89811 Chronic graft-versus-host disease: Principal | ICD-10-CM

## 2019-11-14 DIAGNOSIS — D89813 Graft-versus-host disease, unspecified: Secondary | ICD-10-CM

## 2019-11-14 DIAGNOSIS — T8609 Other complications of bone marrow transplant: Principal | ICD-10-CM

## 2019-11-14 DIAGNOSIS — Z9641 Presence of insulin pump (external) (internal): Principal | ICD-10-CM

## 2019-11-14 DIAGNOSIS — Z08 Encounter for follow-up examination after completed treatment for malignant neoplasm: Principal | ICD-10-CM

## 2019-11-14 DIAGNOSIS — D849 Immunodeficiency, unspecified: Principal | ICD-10-CM

## 2019-11-14 DIAGNOSIS — K8689 Other specified diseases of pancreas: Principal | ICD-10-CM

## 2019-11-14 DIAGNOSIS — Z9221 Personal history of antineoplastic chemotherapy: Principal | ICD-10-CM

## 2019-11-14 MED ORDER — ESZOPICLONE 2 MG TABLET
ORAL_TABLET | 2 refills | 0 days | Status: CP
Start: 2019-11-14 — End: ?

## 2019-12-07 DIAGNOSIS — Z9484 Stem cells transplant status: Principal | ICD-10-CM

## 2019-12-12 ENCOUNTER — Other Ambulatory Visit: Admit: 2019-12-12 | Discharge: 2019-12-13 | Payer: PRIVATE HEALTH INSURANCE

## 2019-12-12 DIAGNOSIS — D89813 Graft-versus-host disease, unspecified: Principal | ICD-10-CM

## 2019-12-12 DIAGNOSIS — Z9484 Stem cells transplant status: Principal | ICD-10-CM

## 2019-12-12 DIAGNOSIS — T8609 Other complications of bone marrow transplant: Principal | ICD-10-CM

## 2019-12-12 DIAGNOSIS — Z9481 Bone marrow transplant status: Principal | ICD-10-CM

## 2019-12-12 MED ORDER — TACROLIMUS 0.5 MG CAPSULE, IMMEDIATE-RELEASE: capsule | 2 refills | 0 days | Status: AC

## 2019-12-12 MED ORDER — TACROLIMUS 0.5 MG CAPSULE, IMMEDIATE-RELEASE
ORAL_CAPSULE | Freq: Two times a day (BID) | ORAL | 2 refills | 0.00000 days | Status: CP
Start: 2019-12-12 — End: 2019-12-12

## 2019-12-14 ENCOUNTER — Other Ambulatory Visit: Admit: 2019-12-14 | Discharge: 2019-12-14 | Payer: PRIVATE HEALTH INSURANCE

## 2019-12-14 ENCOUNTER — Other Ambulatory Visit: Payer: Self-pay | Admitting: Interventional Radiology

## 2019-12-14 ENCOUNTER — Other Ambulatory Visit: Payer: Self-pay

## 2019-12-14 DIAGNOSIS — D89813 Graft-versus-host disease, unspecified: Principal | ICD-10-CM

## 2019-12-14 DIAGNOSIS — T8609 Other complications of bone marrow transplant: Principal | ICD-10-CM

## 2019-12-14 DIAGNOSIS — Z9484 Stem cells transplant status: Principal | ICD-10-CM

## 2019-12-14 DIAGNOSIS — Z9481 Bone marrow transplant status: Principal | ICD-10-CM

## 2019-12-14 DIAGNOSIS — I728 Aneurysm of other specified arteries: Secondary | ICD-10-CM

## 2019-12-21 DIAGNOSIS — D89813 Graft-versus-host disease, unspecified: Principal | ICD-10-CM

## 2019-12-21 MED ORDER — RUXOLITINIB 10 MG TABLET
ORAL_TABLET | Freq: Two times a day (BID) | ORAL | 6 refills | 30 days | Status: CP
Start: 2019-12-21 — End: 2020-01-20

## 2019-12-23 MED ORDER — SERTRALINE 100 MG TABLET
ORAL_TABLET | 3 refills | 0 days | Status: CP
Start: 2019-12-23 — End: ?

## 2020-01-02 ENCOUNTER — Ambulatory Visit: Admit: 2020-01-02 | Discharge: 2020-01-03 | Payer: PRIVATE HEALTH INSURANCE

## 2020-01-02 DIAGNOSIS — D89811 Chronic graft-versus-host disease: Principal | ICD-10-CM

## 2020-01-02 DIAGNOSIS — M3501 Sicca syndrome with keratoconjunctivitis: Principal | ICD-10-CM

## 2020-01-02 MED ORDER — FLUOROMETHOLONE 0.1 % EYE OINTMENT
Freq: Every evening | OPHTHALMIC | 3 refills | 0 days | Status: CP
Start: 2020-01-02 — End: 2020-01-03

## 2020-01-02 MED ORDER — FLUOROMETHOLONE 0.1 % EYE DROPS,SUSPENSION
0 refills | 0 days
Start: 2020-01-02 — End: ?

## 2020-01-03 DIAGNOSIS — H169 Unspecified keratitis: Principal | ICD-10-CM

## 2020-01-04 DIAGNOSIS — D89813 Graft-versus-host disease, unspecified: Principal | ICD-10-CM

## 2020-01-04 DIAGNOSIS — T8609 Other complications of bone marrow transplant: Principal | ICD-10-CM

## 2020-01-04 MED ORDER — FLUOROMETHOLONE ACETATE 0.1 % EYE DROPS,SUSPENSION
Freq: Every day | OPHTHALMIC | 1 refills | 100 days | Status: CP
Start: 2020-01-04 — End: ?

## 2020-01-09 MED ORDER — PREDNISOLONE ACETATE 1 % EYE DROPS,SUSPENSION
OPHTHALMIC | 0 refills | 0 days | Status: CP
Start: 2020-01-09 — End: ?

## 2020-01-11 ENCOUNTER — Other Ambulatory Visit: Payer: Self-pay

## 2020-01-11 ENCOUNTER — Other Ambulatory Visit (HOSPITAL_COMMUNITY): Payer: Commercial Managed Care - PPO

## 2020-01-11 ENCOUNTER — Ambulatory Visit
Admission: RE | Admit: 2020-01-11 | Discharge: 2020-01-11 | Disposition: A | Discharge status: Home / Self Care | Payer: Managed Care, Other (non HMO) | Source: Ambulatory Visit | Attending: Interventional Radiology | Admitting: Interventional Radiology

## 2020-01-11 DIAGNOSIS — I728 Aneurysm of other specified arteries: Secondary | ICD-10-CM | POA: Diagnosis present

## 2020-01-11 LAB — POCT I-STAT CREATININE: Creatinine, Ser: 1.5 mg/dL — ABNORMAL HIGH (ref 0.61–1.24)

## 2020-01-11 MED ORDER — IOHEXOL 350 MG/ML SOLN
100.0000 mL | Freq: Once | INTRAVENOUS | Status: AC | PRN
  Administered 2020-01-11: 100 mL via INTRAVENOUS

## 2020-01-15 DIAGNOSIS — Z9481 Bone marrow transplant status: Principal | ICD-10-CM

## 2020-01-15 DIAGNOSIS — D89813 Graft-versus-host disease, unspecified: Principal | ICD-10-CM

## 2020-01-16 ENCOUNTER — Ambulatory Visit
Admit: 2020-01-16 | Discharge: 2020-01-17 | Payer: PRIVATE HEALTH INSURANCE | Attending: Hematology & Oncology | Primary: Hematology & Oncology

## 2020-01-16 ENCOUNTER — Other Ambulatory Visit: Admit: 2020-01-16 | Discharge: 2020-01-17 | Payer: PRIVATE HEALTH INSURANCE

## 2020-01-16 DIAGNOSIS — D849 Immunodeficiency, unspecified: Principal | ICD-10-CM

## 2020-01-16 DIAGNOSIS — Z9221 Personal history of antineoplastic chemotherapy: Principal | ICD-10-CM

## 2020-01-16 DIAGNOSIS — I2510 Atherosclerotic heart disease of native coronary artery without angina pectoris: Principal | ICD-10-CM

## 2020-01-16 DIAGNOSIS — K760 Fatty (change of) liver, not elsewhere classified: Principal | ICD-10-CM

## 2020-01-16 DIAGNOSIS — D89813 Graft-versus-host disease, unspecified: Principal | ICD-10-CM

## 2020-01-16 DIAGNOSIS — I10 Essential (primary) hypertension: Principal | ICD-10-CM

## 2020-01-16 DIAGNOSIS — Z9481 Bone marrow transplant status: Principal | ICD-10-CM

## 2020-01-16 DIAGNOSIS — T8609 Other complications of bone marrow transplant: Principal | ICD-10-CM

## 2020-01-16 DIAGNOSIS — M3501 Sicca syndrome with keratoconjunctivitis: Principal | ICD-10-CM

## 2020-01-16 DIAGNOSIS — C8590 Non-Hodgkin lymphoma, unspecified, unspecified site: Principal | ICD-10-CM

## 2020-01-16 DIAGNOSIS — E039 Hypothyroidism, unspecified: Principal | ICD-10-CM

## 2020-01-16 DIAGNOSIS — E119 Type 2 diabetes mellitus without complications: Principal | ICD-10-CM

## 2020-01-16 DIAGNOSIS — C8190 Hodgkin lymphoma, unspecified, unspecified site: Principal | ICD-10-CM

## 2020-01-16 DIAGNOSIS — Z9641 Presence of insulin pump (external) (internal): Principal | ICD-10-CM

## 2020-01-16 DIAGNOSIS — R197 Diarrhea, unspecified: Principal | ICD-10-CM

## 2020-01-16 DIAGNOSIS — T865 Complications of stem cell transplant: Principal | ICD-10-CM

## 2020-01-16 DIAGNOSIS — Z794 Long term (current) use of insulin: Principal | ICD-10-CM

## 2020-01-27 ENCOUNTER — Telehealth: Payer: Managed Care, Other (non HMO)

## 2020-02-07 ENCOUNTER — Ambulatory Visit
Admission: RE | Admit: 2020-02-07 | Discharge: 2020-02-07 | Disposition: A | Discharge status: Home / Self Care | Payer: Managed Care, Other (non HMO) | Source: Ambulatory Visit | Attending: Interventional Radiology | Admitting: Interventional Radiology

## 2020-02-07 ENCOUNTER — Encounter: Payer: Self-pay | Admitting: *Deleted

## 2020-02-07 ENCOUNTER — Other Ambulatory Visit: Payer: Self-pay

## 2020-02-07 DIAGNOSIS — I728 Aneurysm of other specified arteries: Secondary | ICD-10-CM

## 2020-02-07 HISTORY — PX: IR RADIOLOGIST EVAL & MGMT: IMG5224

## 2020-02-07 NOTE — Progress Notes (Signed)
Chief Complaint: Patient was consulted remotely today (TeleHealth) for giant hepatic artery aneurysm at the request of [name].    Referring Physician(s): Gizzelle Lacomb K  History of Present Illness: [name] [name] is a 55 y.o. 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 This was discovered incidentally on imaging workup for lymphoma. Patient underwent endovascular embolization using a combination of coils and liquid embolic in January 2229.  He is currently doing well and currently in remission regarding his lymphoma. 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  He is at his baseline today with no acute complaints.   Surveillance CT arteriogram performed 01/11/2020 demonstrates a durable successful endovascular embolization of his giant hepatic artery aneurysm.  The aneurysm is actually slightly decreased in size.  No new aneurysm formation.  Past Medical History:  Diagnosis Date  . Cancer ([place]) 2015   cervical lymphadenopathy  . Depression   . Diabetes mellitus without complication (HCC)    Pre-diabetes  . Encounter for antineoplastic chemotherapy 03/20/2015  . GVHD (graft-versus-host disease) complicating bone marrow transplant ([place])   . Headache(784.0)   . Hepatic artery aneurysm ([place]) 2015  . Hodgkin lymphoma ([place]) 2015  . Hypertension   . Pain, dental 02/12/2015  . Thyroid disease    Hx: of    Past Surgical History:  Procedure Laterality Date  . IR GENERIC HISTORICAL  01/01/2016   IR RADIOLOGIST EVAL & MGMT 01/01/2016 [name], MD [facility] INTERV RAD  . IR RADIOLOGIST EVAL & MGMT  12/24/2017  . MASS EXCISION Left 12/31/2012   Procedure: CERVICAL LYMPH NODE BIOPSY;  Surgeon: [name], MD;  Location: [facility];  Service: General;  Laterality: Left;  . TONSILLECTOMY      Allergies: Patient has no known allergies.  Medications: Prior  to Admission medications   Medication Sig Start Date End Date Taking? Authorizing Provider  amLODipine (NORVASC) 5 MG tablet Take 5 mg by mouth daily.    [provider]  aspirin EC 81 MG tablet Take 1 tablet (81 mg total) by mouth daily. 02/23/15   Larey Dresser, MD  Azelastine-Fluticasone [phone] MCG/ACT SUSP Place 1 spray into the nose 2 (two) times daily.    [provider]  cefdinir (OMNICEF) 300 MG capsule Take 300 mg by mouth 2 (two) times daily.    [provider]  Cholecalciferol (D 1000) 1000 units capsule Take 1,000 mg by mouth daily.    [provider]  eszopiclone (LUNESTA) 2 MG TABS tablet Take 2 mg by mouth at bedtime as needed for sleep. Reported on 04/05/2015    [provider]  fenofibrate (TRICOR) 145 MG tablet Take 1 tablet (145 mg total) by mouth daily. Patient not taking: Reported on 12/24/2017 06/12/15   Larey Dresser, MD  insulin glargine (LANTUS) 100 UNIT/ML injection Inject 15 Units into the skin at bedtime.    [provider]  insulin lispro (HUMALOG) 100 UNIT/ML injection Inject into the skin 2 times daily at 12 noon and 4 pm. Sliding scale for dose    [provider]  Isavuconazonium Sulfate (CRESEMBA) 186 MG CAPS Take 2 capsules by mouth daily.    [provider]  levothyroxine (SYNTHROID, LEVOTHROID) 200 MCG tablet Take 200 mcg by mouth daily before breakfast.    [provider]  lidocaine-prilocaine (EMLA) cream Apply 1 application topically as needed. Patient not taking: Reported on 12/24/2017 06/04/15  [name], NP  lovastatin (ALTOPREV) 40 MG 24 hr tablet Take 40 mg by mouth at bedtime.    [provider]  metFORMIN (GLUCOPHAGE) 500 MG tablet Take 500 mg by mouth 2 (two) times daily. Reported on 07/06/2015 07/28/14   [provider]  omega-3 acid ethyl esters (LOVAZA) 1 g capsule TAKE 2 CAPSULES BY MOUTH TWICE A DAY 09/09/16   Larey Dresser, MD   ondansetron (ZOFRAN) 8 MG tablet Every 12 hours as needed for nausea/vomiting.  Begin on day 3 after chemo. Patient not taking: Reported on 01/01/2016 02/21/15   [name], MD  oxyCODONE (OXY IR/ROXICODONE) 5 MG immediate release tablet Take 1 tablet (5 mg total) by mouth every 6 (six) hours as needed for severe pain. Patient not taking: Reported on 12/24/2017 03/16/14   [name], MD  predniSONE (DELTASONE) 10 MG tablet Take 10 mg by mouth daily with breakfast. Take 1/2 tablet by mouth daily    [provider]  prochlorperazine (COMPAZINE) 10 MG tablet Take 1 tablet (10 mg total) by mouth every 6 (six) hours as needed for nausea or vomiting. Patient not taking: Reported on 01/01/2016 02/21/15   [name], MD  ruxolitinib phosphate (JAKAFI) 10 MG tablet Take 20 mg by mouth 2 (two) times daily.    [provider]  sertraline (ZOLOFT) 100 MG tablet Take 100 mg by mouth daily with breakfast.     [provider]  sodium fluoride (PREVIDENT 5000 PLUS) 1.1 % CREA dental cream Apply fluoride to tooth brush. Brush teeth for 2 minutes. Spit out excess-DO NOT swallow. Repeat nightly. Patient not taking: Reported on 12/24/2017 04/09/15   [name], DDS  Sulfamethoxazole-Trimethoprim (SULFAMETHOXAZOLE-TMP DS PO) Take 1 tablet by mouth daily.    [provider]  valACYclovir (VALTREX) 500 MG tablet Take 500 mg by mouth daily.    [provider]     Family History  Problem Relation Age of Onset  . Aneurysm Mother 52       brain  . Obesity Mother   . Heart disease Mother   . AAA (abdominal aortic aneurysm) Mother        dx in her 53s  . Hodgkin's lymphoma Father        dx in his 13s, and again in his 52s  . Diabetes Father   . Cancer Brother        NOS dx in his 74s  . Bone cancer Maternal Uncle 40       don't know if primary or metastises    Social History   Socioeconomic History  . Marital status: Divorced    Spouse name:  Not on file  . Number of children: 0  . Years of education: Not on file  . Highest education level: Not on file  Occupational History    Employer: [employer]  Tobacco Use  . Smoking status: Former Smoker    Packs/day: 1.00    Years: 31.00    Pack years: 31.00    Types: Cigarettes    Quit date: 06/07/2012    Years since quitting: 7.6  . Smokeless tobacco: Never Used  Substance and Sexual Activity  . Alcohol use: No  . Drug use: No  . Sexual activity: Not Currently  Other Topics Concern  . Not on file  Social History Narrative  . Not on file   Social Determinants of Health   Financial Resource Strain: Not on file  Food Insecurity: Not on  file  Transportation Needs: Not on file  Physical Activity: Not on file  Stress: Not on file  Social Connections: Not on file    Review of Systems  Review of Systems: A 12 point ROS discussed and pertinent positives are indicated in the HPI above.  All other systems are negative.  Physical Exam No direct physical exam was performed (except for noted visual exam findings with Video Visits).   Vital Signs: There were no vitals taken for this visit.  Imaging: CT ANGIO ABDOMEN W &/OR WO CONTRAST  Result Date: 01/11/2020 CLINICAL DATA:  55 year old male with history of left hepatic artery aneurysm status post coil and onyx embolization in 2015 and non-Hodgkin's lymphoma. Surveillance study. EXAM: CT ANGIOGRAPHY ABDOMEN TECHNIQUE: Multidetector CT imaging of the abdomen was performed using the standard protocol during bolus administration of intravenous contrast. Multiplanar reconstructed images and MIPs were obtained and reviewed to evaluate the vascular anatomy. CONTRAST:  129mL OMNIPAQUE IOHEXOL 350 MG/ML SOLN COMPARISON:  12/24/2017 and 01/01/2016 FINDINGS: VASCULAR Aorta: Normal caliber aorta without aneurysm, dissection, vasculitis or significant stenosis. Celiac: The celiac trunk, left gastric, and splenic arteries are patent.  Similar appearing postprocedural changes after coil and liquid embolic embolization of left hepatic artery aneurysm without evidence of internal flow. The aneurysm sac measures approximately 5.0 x 5.7 x 4.3 cm (AP x trans x CC), previously 5.2 x 6.9 x 5.0 cm (AP x trans x CC). SMA: Patent without evidence of aneurysm, dissection, vasculitis or significant stenosis. A replaced right hepatic artery is again noted. Renals: Single right and triple left renal arteries are patent without evidence of aneurysm, dissection, vasculitis, fibromuscular dysplasia or significant stenosis. IMA: Patent without evidence of aneurysm, dissection, vasculitis or significant stenosis. Inflow: Patent without evidence of aneurysm, dissection, vasculitis or significant stenosis. Veins: No acute venous abnormality. Review of the MIP images confirms the above findings. NON-VASCULAR Lower chest: No acute abnormality. Hepatobiliary: No focal liver abnormality is seen. No gallstones, gallbladder wall thickening, or biliary dilatation. Pancreas: Diffusely atrophic. Spleen: Normal in size without focal abnormality. Adrenals/Urinary Tract: Adrenal glands are unremarkable. Kidneys are normal, without renal calculi, focal lesion, or hydronephrosis. Bladder is unremarkable. Stomach/Bowel: Stomach is within normal limits. Appendix appears normal. No evidence of bowel wall thickening, distention, or inflammatory changes. Lymphatic: Scattered periaortic subcentimeter lymph nodes, none suspicious. Similar appearing most prominent left common iliac lymph node measuring up to 9 mm in short axis, unchanged. Other: No abdominal wall hernia or abnormality. No ascites. Musculoskeletal: No acute or significant osseous findings. IMPRESSION: VASCULAR Continued favorable response after coil and liquid embolization of left hepatic artery aneurysm which remains completely thrombosed with decreased aneurysm sac size. NON-VASCULAR 1. Gradual diffuse pancreatic atrophy  since 2015, now nearly invisible. Recommend correlation with any clinical endocrine abnormalities. 2. No abdominal lymphadenopathy. Ruthann Cancer, MD [facility] Radiology Electronically Signed   By: Ruthann Cancer MD   On: 01/11/2020 11:21    Labs:  CBC: No results for input(s): WBC, HGB, HCT, PLT in the last 8760 hours.  COAGS: No results for input(s): INR, APTT in the last 8760 hours.  BMP: Recent Labs    01/11/20 0907  CREATININE 1.50*    LIVER FUNCTION TESTS: No results for input(s): BILITOT, AST, ALT, ALKPHOS, PROT, ALBUMIN in the last 8760 hours.  TUMOR MARKERS: No results for input(s): AFPTM, CEA, CA199, CHROMGRNA in the last 8760 hours.  Assessment and Plan:  Durable and excellent result of coil embolization of giant hepatic artery  aneurysm.  No evidence of recurrent or new aneurysm formation.  Continue surveillance every other year.  1.)  Repeat CT arteriogram of the abdomen and pelvis with accompanying clinic visit in January 2024.   Electronically Signed: Criselda Peaches 02/07/2020, 11:15 AM    I spent a total of 10 Minutes in remote  clinical consultation, greater than 50% of which was counseling/coordinating care for hepatic artery aneurysm.    Visit type: Audio only (telephone). Audio (no video) only due to patient preference. Alternative for in-person consultation at [facility], [address] [place], [place], [place]. This visit type was conducted due to national recommendations for restrictions regarding the COVID-19 Pandemic (e.g. social distancing).  This format is felt to be most appropriate for this patient at this time.  All issues noted in this document were discussed and addressed.

## 2020-02-09 DIAGNOSIS — Z92241 Personal history of systemic steroid therapy: Principal | ICD-10-CM

## 2020-02-09 DIAGNOSIS — Z9484 Stem cells transplant status: Principal | ICD-10-CM

## 2020-02-16 DIAGNOSIS — D89811 Chronic graft-versus-host disease: Principal | ICD-10-CM

## 2020-02-16 DIAGNOSIS — D89813 Graft-versus-host disease, unspecified: Principal | ICD-10-CM

## 2020-02-16 MED ORDER — TACROLIMUS 0.5 MG CAPSULE, IMMEDIATE-RELEASE
ORAL_CAPSULE | Freq: Two times a day (BID) | ORAL | 2 refills | 30 days | Status: CP
Start: 2020-02-16 — End: ?

## 2020-03-24 MED ORDER — METFORMIN 500 MG TABLET
ORAL_TABLET | 3 refills | 0 days
Start: 2020-03-24 — End: ?

## 2020-03-28 DIAGNOSIS — Z9484 Stem cells transplant status: Principal | ICD-10-CM

## 2020-03-28 MED ORDER — SULFAMETHOXAZOLE 800 MG-TRIMETHOPRIM 160 MG TABLET
ORAL_TABLET | ORAL | 5 refills | 28 days | Status: CP
Start: 2020-03-28 — End: ?

## 2020-04-16 ENCOUNTER — Other Ambulatory Visit: Admit: 2020-04-16 | Discharge: 2020-04-17 | Payer: PRIVATE HEALTH INSURANCE

## 2020-04-16 ENCOUNTER — Ambulatory Visit
Admit: 2020-04-16 | Discharge: 2020-04-17 | Payer: PRIVATE HEALTH INSURANCE | Attending: Hematology & Oncology | Primary: Hematology & Oncology

## 2020-04-16 ENCOUNTER — Ambulatory Visit: Admit: 2020-04-16 | Discharge: 2020-04-17 | Payer: PRIVATE HEALTH INSURANCE

## 2020-04-16 DIAGNOSIS — C8120 Mixed cellularity classical Hodgkin lymphoma, unspecified site: Principal | ICD-10-CM

## 2020-04-16 DIAGNOSIS — D89813 Graft-versus-host disease, unspecified: Principal | ICD-10-CM

## 2020-04-16 DIAGNOSIS — M3501 Sicca syndrome with keratoconjunctivitis: Principal | ICD-10-CM

## 2020-04-16 DIAGNOSIS — T8609 Other complications of bone marrow transplant: Principal | ICD-10-CM

## 2020-04-16 DIAGNOSIS — Z9481 Bone marrow transplant status: Principal | ICD-10-CM

## 2020-04-16 DIAGNOSIS — D849 Immunodeficiency, unspecified: Principal | ICD-10-CM

## 2020-04-16 DIAGNOSIS — Z9484 Stem cells transplant status: Principal | ICD-10-CM

## 2020-04-23 MED ORDER — NOVOLOG FLEXPEN U-100 INSULIN ASPART 100 UNIT/ML (3 ML) SUBCUTANEOUS
0 days
Start: 2020-04-23 — End: ?

## 2020-04-23 MED ORDER — TRESIBA FLEXTOUCH U-100 INSULIN 100 UNIT/ML (3 ML) SUBCUTANEOUS PEN
0 days
Start: 2020-04-23 — End: ?

## 2020-05-24 DIAGNOSIS — D89811 Chronic graft-versus-host disease: Principal | ICD-10-CM

## 2020-05-24 MED ORDER — TACROLIMUS 0.5 MG CAPSULE, IMMEDIATE-RELEASE
ORAL_CAPSULE | Freq: Two times a day (BID) | ORAL | 2 refills | 30.00000 days | Status: CP
Start: 2020-05-24 — End: ?

## 2020-06-27 MED ORDER — JAKAFI 10 MG TABLET
0 days
Start: 2020-06-27 — End: ?

## 2020-07-20 ENCOUNTER — Ambulatory Visit: Admit: 2020-07-20 | Discharge: 2020-07-21 | Payer: PRIVATE HEALTH INSURANCE

## 2020-07-20 DIAGNOSIS — D89811 Chronic graft-versus-host disease: Principal | ICD-10-CM

## 2020-09-24 DIAGNOSIS — Z9484 Stem cells transplant status: Principal | ICD-10-CM

## 2020-09-24 MED ORDER — SULFAMETHOXAZOLE 800 MG-TRIMETHOPRIM 160 MG TABLET
ORAL_TABLET | ORAL | 5 refills | 28 days | Status: CP
Start: 2020-09-24 — End: ?

## 2020-10-09 DIAGNOSIS — D89811 Chronic graft-versus-host disease: Principal | ICD-10-CM

## 2020-10-09 MED ORDER — TACROLIMUS 0.5 MG CAPSULE, IMMEDIATE-RELEASE
ORAL_CAPSULE | Freq: Two times a day (BID) | ORAL | 2 refills | 30 days | Status: CP
Start: 2020-10-09 — End: ?

## 2020-11-19 DIAGNOSIS — D89813 Graft-versus-host disease, unspecified: Principal | ICD-10-CM

## 2020-11-19 DIAGNOSIS — T8609 Other complications of bone marrow transplant: Principal | ICD-10-CM

## 2020-11-19 MED ORDER — URSODIOL 300 MG CAPSULE
ORAL_CAPSULE | 3 refills | 0 days
Start: 2020-11-19 — End: ?

## 2020-11-19 MED ORDER — ACYCLOVIR 400 MG TABLET
ORAL_TABLET | Freq: Two times a day (BID) | ORAL | 3 refills | 0 days
Start: 2020-11-19 — End: ?

## 2020-11-20 MED ORDER — ACYCLOVIR 400 MG TABLET
ORAL_TABLET | Freq: Two times a day (BID) | ORAL | 3 refills | 90 days | Status: CP
Start: 2020-11-20 — End: ?

## 2020-11-21 DIAGNOSIS — R748 Abnormal levels of other serum enzymes: Principal | ICD-10-CM

## 2020-11-21 MED ORDER — URSODIOL 300 MG CAPSULE
ORAL_CAPSULE | 1 refills | 0.00000 days | Status: CP
Start: 2020-11-21 — End: ?

## 2020-11-29 ENCOUNTER — Ambulatory Visit: Admit: 2020-11-29 | Discharge: 2020-11-30 | Payer: PRIVATE HEALTH INSURANCE

## 2020-12-11 DIAGNOSIS — D89813 Graft-versus-host disease, unspecified: Principal | ICD-10-CM

## 2020-12-11 MED ORDER — RUXOLITINIB 10 MG TABLET
ORAL_TABLET | Freq: Two times a day (BID) | ORAL | 6 refills | 30 days | Status: CP
Start: 2020-12-11 — End: 2021-01-10

## 2020-12-28 DIAGNOSIS — D89813 Graft-versus-host disease, unspecified: Principal | ICD-10-CM

## 2020-12-31 ENCOUNTER — Other Ambulatory Visit: Admit: 2020-12-31 | Discharge: 2021-01-01 | Payer: PRIVATE HEALTH INSURANCE

## 2020-12-31 ENCOUNTER — Ambulatory Visit
Admit: 2020-12-31 | Discharge: 2021-01-01 | Payer: PRIVATE HEALTH INSURANCE | Attending: Hematology & Oncology | Primary: Hematology & Oncology

## 2020-12-31 DIAGNOSIS — D89811 Chronic graft-versus-host disease: Principal | ICD-10-CM

## 2020-12-31 DIAGNOSIS — Z9484 Stem cells transplant status: Principal | ICD-10-CM

## 2020-12-31 DIAGNOSIS — Z794 Long term (current) use of insulin: Principal | ICD-10-CM

## 2020-12-31 DIAGNOSIS — D849 Immunodeficiency, unspecified: Principal | ICD-10-CM

## 2020-12-31 DIAGNOSIS — M8580 Other specified disorders of bone density and structure, unspecified site: Principal | ICD-10-CM

## 2020-12-31 DIAGNOSIS — M3501 Sicca syndrome with keratoconjunctivitis: Principal | ICD-10-CM

## 2020-12-31 DIAGNOSIS — Z9481 Bone marrow transplant status: Principal | ICD-10-CM

## 2020-12-31 DIAGNOSIS — E119 Type 2 diabetes mellitus without complications: Principal | ICD-10-CM

## 2020-12-31 DIAGNOSIS — D89813 Graft-versus-host disease, unspecified: Principal | ICD-10-CM

## 2020-12-31 DIAGNOSIS — C8190 Hodgkin lymphoma, unspecified, unspecified site: Principal | ICD-10-CM

## 2021-01-16 DIAGNOSIS — D89813 Graft-versus-host disease, unspecified: Principal | ICD-10-CM

## 2021-01-16 MED ORDER — SERTRALINE 100 MG TABLET
ORAL_TABLET | 3 refills | 0 days
Start: 2021-01-16 — End: ?

## 2021-01-17 MED ORDER — SERTRALINE 100 MG TABLET
ORAL_TABLET | 3 refills | 0 days | Status: CP
Start: 2021-01-17 — End: ?

## 2021-03-01 DIAGNOSIS — Z9481 Bone marrow transplant status: Principal | ICD-10-CM

## 2021-03-01 DIAGNOSIS — M8580 Other specified disorders of bone density and structure, unspecified site: Principal | ICD-10-CM

## 2021-03-01 DIAGNOSIS — M810 Age-related osteoporosis without current pathological fracture: Principal | ICD-10-CM

## 2021-03-01 DIAGNOSIS — Z9484 Stem cells transplant status: Principal | ICD-10-CM

## 2021-03-04 ENCOUNTER — Other Ambulatory Visit: Admit: 2021-03-04 | Discharge: 2021-03-04 | Payer: PRIVATE HEALTH INSURANCE

## 2021-03-04 ENCOUNTER — Ambulatory Visit: Admit: 2021-03-04 | Discharge: 2021-03-04 | Payer: PRIVATE HEALTH INSURANCE

## 2021-03-04 DIAGNOSIS — D89811 Chronic graft-versus-host disease: Principal | ICD-10-CM

## 2021-03-04 DIAGNOSIS — M810 Age-related osteoporosis without current pathological fracture: Principal | ICD-10-CM

## 2021-03-04 DIAGNOSIS — M8580 Other specified disorders of bone density and structure, unspecified site: Principal | ICD-10-CM

## 2021-03-04 DIAGNOSIS — Z9481 Bone marrow transplant status: Principal | ICD-10-CM

## 2021-03-04 DIAGNOSIS — Z9484 Stem cells transplant status: Principal | ICD-10-CM

## 2021-03-29 DIAGNOSIS — Z9484 Stem cells transplant status: Principal | ICD-10-CM

## 2021-03-29 MED ORDER — SULFAMETHOXAZOLE 800 MG-TRIMETHOPRIM 160 MG TABLET
ORAL_TABLET | ORAL | 5 refills | 28 days | Status: CP
Start: 2021-03-29 — End: ?

## 2021-04-02 DIAGNOSIS — D89811 Chronic graft-versus-host disease: Principal | ICD-10-CM

## 2021-04-02 MED ORDER — TACROLIMUS 0.5 MG CAPSULE, IMMEDIATE-RELEASE
ORAL_CAPSULE | Freq: Two times a day (BID) | ORAL | 2 refills | 30 days | Status: CP
Start: 2021-04-02 — End: ?

## 2021-04-28 DIAGNOSIS — D89813 Graft-versus-host disease, unspecified: Principal | ICD-10-CM

## 2021-04-28 DIAGNOSIS — Z9481 Bone marrow transplant status: Principal | ICD-10-CM

## 2021-04-29 ENCOUNTER — Other Ambulatory Visit: Admit: 2021-04-29 | Discharge: 2021-04-29 | Payer: PRIVATE HEALTH INSURANCE

## 2021-04-29 ENCOUNTER — Ambulatory Visit
Admit: 2021-04-29 | Discharge: 2021-04-29 | Payer: PRIVATE HEALTH INSURANCE | Attending: Hematology & Oncology | Primary: Hematology & Oncology

## 2021-04-29 DIAGNOSIS — Z9481 Bone marrow transplant status: Principal | ICD-10-CM

## 2021-04-29 DIAGNOSIS — Z9484 Stem cells transplant status: Principal | ICD-10-CM

## 2021-04-29 DIAGNOSIS — D89813 Graft-versus-host disease, unspecified: Principal | ICD-10-CM

## 2021-04-29 DIAGNOSIS — D89811 Chronic graft-versus-host disease: Principal | ICD-10-CM

## 2021-07-06 MED ORDER — METFORMIN 500 MG TABLET
ORAL_TABLET | 3 refills | 0 days
Start: 2021-07-06 — End: ?

## 2021-07-11 MED ORDER — AMLODIPINE 5 MG TABLET
ORAL_TABLET | Freq: Every day | ORAL | 3 refills | 90 days | Status: CP
Start: 2021-07-11 — End: ?

## 2021-07-11 MED ORDER — METFORMIN 500 MG TABLET
ORAL_TABLET | Freq: Two times a day (BID) | ORAL | 3 refills | 90 days
Start: 2021-07-11 — End: ?

## 2021-07-29 MED ORDER — METFORMIN 500 MG TABLET
ORAL_TABLET | Freq: Two times a day (BID) | ORAL | 3 refills | 0.00000 days | Status: CP
Start: 2021-07-29 — End: ?

## 2021-08-19 ENCOUNTER — Other Ambulatory Visit: Admit: 2021-08-19 | Discharge: 2021-08-20 | Payer: PRIVATE HEALTH INSURANCE

## 2021-08-19 ENCOUNTER — Ambulatory Visit
Admit: 2021-08-19 | Discharge: 2021-08-20 | Payer: PRIVATE HEALTH INSURANCE | Attending: Hematology & Oncology | Primary: Hematology & Oncology

## 2021-08-19 DIAGNOSIS — Z9484 Stem cells transplant status: Principal | ICD-10-CM

## 2021-08-19 DIAGNOSIS — M3501 Sicca syndrome with keratoconjunctivitis: Principal | ICD-10-CM

## 2021-08-19 DIAGNOSIS — Z9481 Bone marrow transplant status: Principal | ICD-10-CM

## 2021-08-19 DIAGNOSIS — C8190 Hodgkin lymphoma, unspecified, unspecified site: Principal | ICD-10-CM

## 2021-08-19 DIAGNOSIS — D89811 Chronic graft-versus-host disease: Principal | ICD-10-CM

## 2021-08-19 DIAGNOSIS — D89813 Graft-versus-host disease, unspecified: Principal | ICD-10-CM

## 2021-08-19 DIAGNOSIS — D849 Immunodeficiency, unspecified: Principal | ICD-10-CM

## 2021-08-21 ENCOUNTER — Ambulatory Visit: Admit: 2021-08-21 | Discharge: 2021-08-22 | Payer: PRIVATE HEALTH INSURANCE

## 2021-08-21 DIAGNOSIS — H25813 Combined forms of age-related cataract, bilateral: Principal | ICD-10-CM

## 2021-09-02 MED ORDER — ESZOPICLONE 2 MG TABLET
ORAL_TABLET | ORAL | 5 refills | 0.00000 days | Status: CP
Start: 2021-09-02 — End: 2021-09-02

## 2021-10-16 ENCOUNTER — Ambulatory Visit: Admit: 2021-10-16 | Discharge: 2021-10-17 | Payer: PRIVATE HEALTH INSURANCE

## 2021-10-16 DIAGNOSIS — D89811 Chronic graft-versus-host disease: Principal | ICD-10-CM

## 2021-10-16 DIAGNOSIS — H25813 Combined forms of age-related cataract, bilateral: Principal | ICD-10-CM

## 2021-10-22 DIAGNOSIS — D89811 Chronic graft-versus-host disease: Principal | ICD-10-CM

## 2021-10-22 MED ORDER — TACROLIMUS 0.5 MG CAPSULE, IMMEDIATE-RELEASE
ORAL_CAPSULE | Freq: Two times a day (BID) | ORAL | 3 refills | 30 days | Status: CP
Start: 2021-10-22 — End: ?

## 2021-12-16 ENCOUNTER — Ambulatory Visit: Admit: 2021-12-16 | Discharge: 2021-12-17 | Payer: PRIVATE HEALTH INSURANCE

## 2021-12-16 DIAGNOSIS — H25813 Combined forms of age-related cataract, bilateral: Principal | ICD-10-CM

## 2021-12-17 ENCOUNTER — Encounter: Admit: 2021-12-17 | Discharge: 2021-12-17 | Payer: PRIVATE HEALTH INSURANCE

## 2021-12-17 ENCOUNTER — Ambulatory Visit: Admit: 2021-12-17 | Discharge: 2021-12-17 | Payer: PRIVATE HEALTH INSURANCE

## 2021-12-17 MED ORDER — PREDNISOLONE ACETATE 1 % EYE DROPS,SUSPENSION
Freq: Four times a day (QID) | OPHTHALMIC | 1 refills | 50 days | Status: CP
Start: 2021-12-17 — End: 2022-01-16

## 2021-12-17 MED ORDER — MOXIFLOXACIN 0.5 % EYE DROPS
Freq: Four times a day (QID) | OPHTHALMIC | 0 refills | 15 days | Status: CP
Start: 2021-12-17 — End: 2021-12-24

## 2021-12-18 ENCOUNTER — Ambulatory Visit: Admit: 2021-12-18 | Discharge: 2021-12-19 | Payer: PRIVATE HEALTH INSURANCE

## 2021-12-18 DIAGNOSIS — H25813 Combined forms of age-related cataract, bilateral: Principal | ICD-10-CM

## 2021-12-19 DIAGNOSIS — D89813 Graft-versus-host disease, unspecified: Principal | ICD-10-CM

## 2021-12-19 DIAGNOSIS — T8609 Other complications of bone marrow transplant: Principal | ICD-10-CM

## 2021-12-19 MED ORDER — ESZOPICLONE 2 MG TABLET
ORAL_TABLET | 0 refills | 0 days
Start: 2021-12-19 — End: ?

## 2021-12-19 MED ORDER — ACYCLOVIR 400 MG TABLET
ORAL_TABLET | Freq: Two times a day (BID) | ORAL | 3 refills | 0 days
Start: 2021-12-19 — End: ?

## 2021-12-23 DIAGNOSIS — D89813 Graft-versus-host disease, unspecified: Principal | ICD-10-CM

## 2021-12-23 MED ORDER — RUXOLITINIB 10 MG TABLET
ORAL_TABLET | Freq: Two times a day (BID) | ORAL | 6 refills | 30 days | Status: CP
Start: 2021-12-23 — End: 2022-07-21

## 2021-12-25 ENCOUNTER — Ambulatory Visit: Admit: 2021-12-25 | Discharge: 2021-12-26 | Payer: PRIVATE HEALTH INSURANCE

## 2021-12-25 DIAGNOSIS — H25813 Combined forms of age-related cataract, bilateral: Principal | ICD-10-CM

## 2021-12-25 MED ORDER — ESZOPICLONE 2 MG TABLET
ORAL_TABLET | 0 refills | 0 days
Start: 2021-12-25 — End: ?

## 2022-01-02 ENCOUNTER — Encounter: Payer: Self-pay | Admitting: Interventional Radiology

## 2022-01-02 ENCOUNTER — Other Ambulatory Visit: Payer: Self-pay | Admitting: Interventional Radiology

## 2022-01-02 DIAGNOSIS — I728 Aneurysm of other specified arteries: Secondary | ICD-10-CM

## 2022-01-22 ENCOUNTER — Ambulatory Visit: Admit: 2022-01-22 | Discharge: 2022-01-23 | Payer: PRIVATE HEALTH INSURANCE

## 2022-01-22 DIAGNOSIS — H25813 Combined forms of age-related cataract, bilateral: Principal | ICD-10-CM

## 2022-01-23 ENCOUNTER — Other Ambulatory Visit: Payer: Self-pay | Admitting: Interventional Radiology

## 2022-01-23 DIAGNOSIS — I728 Aneurysm of other specified arteries: Secondary | ICD-10-CM

## 2022-01-24 ENCOUNTER — Ambulatory Visit
Admission: RE | Admit: 2022-01-24 | Discharge: 2022-01-24 | Disposition: A | Discharge status: Home / Self Care | Payer: Managed Care, Other (non HMO) | Source: Ambulatory Visit | Attending: Interventional Radiology | Admitting: Interventional Radiology

## 2022-01-24 ENCOUNTER — Encounter (HOSPITAL_COMMUNITY): Payer: Self-pay

## 2022-01-24 ENCOUNTER — Ambulatory Visit (HOSPITAL_COMMUNITY): Payer: Managed Care, Other (non HMO)

## 2022-01-24 DIAGNOSIS — I728 Aneurysm of other specified arteries: Secondary | ICD-10-CM

## 2022-01-24 MED ORDER — IOPAMIDOL (ISOVUE-370) INJECTION 76%
75.0000 mL | Freq: Once | INTRAVENOUS | Status: AC | PRN
  Administered 2022-01-24: 60 mL via INTRAVENOUS

## 2022-02-04 ENCOUNTER — Ambulatory Visit
Admission: RE | Admit: 2022-02-04 | Discharge: 2022-02-04 | Disposition: A | Discharge status: Home / Self Care | Payer: Managed Care, Other (non HMO) | Source: Ambulatory Visit | Attending: Interventional Radiology | Admitting: Interventional Radiology

## 2022-02-04 DIAGNOSIS — I728 Aneurysm of other specified arteries: Secondary | ICD-10-CM

## 2022-02-04 NOTE — Progress Notes (Signed)
Chief Complaint: Patient was consulted remotely today (TeleHealth) for giant hepatic artery aneurysm at the request of [name].    Referring Physician(s): [name]  History of Present Illness: [name] [name] is a 57 y.o. 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 This was discovered incidentally on imaging workup for lymphoma. Patient underwent endovascular embolization using a combination of coils and liquid embolic in January 1856.   He has undergone extensive surveillance imaging beginning in March 2015 and most recently in January 2024.  All follow-up imaging has demonstrated successful embolization of the hepatic artery aneurysm with no evidence of revascularization or new aneurysm formation.  most recent imaging from 01/24/22: Successful endovascular treatment of a giant left hepatic artery aneurysm. The excluded aneurysm sac is decreasing in size and there is no evidence of vascular flow within the embolized portion of the artery.  He is currently doing well and currently in remission regarding his lymphoma. 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  He is at his baseline today with no acute complaints.    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) complicating bone marrow transplant ([place])    Headache(784.0)    Hepatic artery aneurysm ([place]) 2015   Hodgkin lymphoma ([place]) 2015   Hypertension    Pain, dental 02/12/2015   Thyroid disease    Hx: of    Past Surgical History:  Procedure Laterality Date   IR GENERIC HISTORICAL  01/01/2016   IR RADIOLOGIST EVAL & MGMT 01/01/2016 [name], MD [facility] INTERV RAD   IR RADIOLOGIST EVAL &  MGMT  12/24/2017   IR RADIOLOGIST EVAL & MGMT  02/07/2020   MASS EXCISION Left 12/31/2012   Procedure: CERVICAL LYMPH NODE BIOPSY;  Surgeon: [name], MD;  Location: [facility];  Service: General;  Laterality: Left;   TONSILLECTOMY      Allergies: Patient has no known allergies.  Medications: Prior to Admission medications   Medication Sig Start Date End Date Taking? Authorizing Provider  amLODipine (NORVASC) 5 MG tablet Take 5 mg by mouth daily.    [provider]  aspirin EC 81 MG tablet Take 1 tablet (81 mg total) by mouth daily. 02/23/15   Larey Dresser, MD  Azelastine-Fluticasone [phone] MCG/ACT SUSP Place 1 spray into the nose 2 (two) times daily.    [provider]  cefdinir (OMNICEF) 300 MG capsule Take 300 mg by mouth 2 (two) times daily.    [provider]  Cholecalciferol (D 1000) 1000 units capsule Take 1,000 mg by mouth daily.    [provider]  eszopiclone (LUNESTA) 2 MG TABS tablet Take 2 mg by mouth at bedtime as needed for sleep. Reported on 04/05/2015    [provider]  fenofibrate (TRICOR) 145 MG tablet Take 1 tablet (145 mg total) by mouth daily. Patient not taking: Reported on 12/24/2017 06/12/15   Larey Dresser, MD  insulin glargine (LANTUS) 100 UNIT/ML injection Inject 15 Units into the skin at bedtime.    [provider]  insulin lispro (HUMALOG) 100 UNIT/ML injection Inject into the skin 2 times daily at 12 noon and 4 pm. Sliding scale for dose    [provider]  Isavuconazonium  Sulfate (CRESEMBA) 186 MG CAPS Take 2 capsules by mouth daily.    [provider]  levothyroxine (SYNTHROID, LEVOTHROID) 200 MCG tablet Take 200 mcg by mouth daily before breakfast.    [provider]  lidocaine-prilocaine (EMLA) cream Apply 1 application topically as needed. Patient not taking: Reported on 12/24/2017 06/04/15   [name], NP  lovastatin (ALTOPREV) 40 MG 24 hr tablet Take 40 mg by  mouth at bedtime.    [provider]  metFORMIN (GLUCOPHAGE) 500 MG tablet Take 500 mg by mouth 2 (two) times daily. Reported on 07/06/2015 07/28/14   [provider]  omega-3 acid ethyl esters (LOVAZA) 1 g capsule TAKE 2 CAPSULES BY MOUTH TWICE A DAY 09/09/16   Larey Dresser, MD  ondansetron (ZOFRAN) 8 MG tablet Every 12 hours as needed for nausea/vomiting.  Begin on day 3 after chemo. Patient not taking: Reported on 01/01/2016 02/21/15   [name], MD  oxyCODONE (OXY IR/ROXICODONE) 5 MG immediate release tablet Take 1 tablet (5 mg total) by mouth every 6 (six) hours as needed for severe pain. Patient not taking: Reported on 12/24/2017 03/16/14   [name], MD  predniSONE (DELTASONE) 10 MG tablet Take 10 mg by mouth daily with breakfast. Take 1/2 tablet by mouth daily    [provider]  prochlorperazine (COMPAZINE) 10 MG tablet Take 1 tablet (10 mg total) by mouth every 6 (six) hours as needed for nausea or vomiting. Patient not taking: Reported on 01/01/2016 02/21/15   [name], MD  ruxolitinib phosphate (JAKAFI) 10 MG tablet Take 20 mg by mouth 2 (two) times daily.    [provider]  sertraline (ZOLOFT) 100 MG tablet Take 100 mg by mouth daily with breakfast.     [provider]  sodium fluoride (PREVIDENT 5000 PLUS) 1.1 % CREA dental cream Apply fluoride to tooth brush. Brush teeth for 2 minutes. Spit out excess-DO NOT swallow. Repeat nightly. Patient not taking: Reported on 12/24/2017 04/09/15   [name], DDS  Sulfamethoxazole-Trimethoprim (SULFAMETHOXAZOLE-TMP DS PO) Take 1 tablet by mouth daily.    [provider]  valACYclovir (VALTREX) 500 MG tablet Take 500 mg by mouth daily.    [provider]     Family History  Problem Relation Age of Onset   Aneurysm Mother 21       brain   Obesity Mother    Heart disease Mother    AAA (abdominal aortic aneurysm) Mother        dx in her 81s   Hodgkin's  lymphoma Father        dx in his 17s, and again in his 76s   Diabetes Father    Cancer Brother        NOS dx in his 52s   Bone cancer Maternal Uncle 23       don't know if primary or metastises    Social History   Socioeconomic History   Marital status: Divorced    Spouse name: Not on file   Number of children: 0   Years of education: Not on file   Highest education level: Not on file  Occupational History    Employer: [employer]  Tobacco Use   Smoking status: Former    Packs/day: 1.00    Years: 31.00    Total pack years: 31.00    Types: Cigarettes    Quit date: 06/07/2012    Years since quitting: 9.6   Smokeless tobacco: Never  Substance  and Sexual Activity   Alcohol use: No   Drug use: No   Sexual activity: Not Currently  Other Topics Concern   Not on file  Social History Narrative   Not on file   Social Determinants of Health   Financial Resource Strain: Not on file  Food Insecurity: Not on file  Transportation Needs: Not on file  Physical Activity: Not on file  Stress: Not on file  Social Connections: Not on file     Review of Systems  Review of Systems: A 12 point ROS discussed and pertinent positives are indicated in the HPI above.  All other systems are negative.    Physical Exam No direct physical exam was performed (except for noted visual exam findings with Video Visits).    Vital Signs: There were no vitals taken for this visit.  Imaging: CT ANGIO ABDOMEN PELVIS  W &/OR WO CONTRAST  Result Date: 01/25/2022 CLINICAL DATA:  57 year old male with a history of giant hepatic artery aneurysm and Hodgkin's lymphoma. Status post coil embolization of hepatic artery aneurysm. Follow-up evaluation. EXAM: CTA ABDOMEN AND PELVIS WITHOUT AND WITH CONTRAST TECHNIQUE: Multidetector CT imaging of the abdomen and pelvis was performed using the standard protocol during bolus administration of intravenous contrast. Multiplanar reconstructed images and MIPs  were obtained and reviewed to evaluate the vascular anatomy. RADIATION DOSE REDUCTION: This exam was performed according to the departmental dose-optimization program which includes automated exposure control, adjustment of the mA and/or kV according to patient size and/or use of iterative reconstruction technique. CONTRAST:  80m ISOVUE-370 IOPAMIDOL (ISOVUE-370) INJECTION 76% COMPARISON:  Prior follow-up CT arteriogram 01/11/2020 FINDINGS: VASCULAR Aorta: Normal caliber aorta without aneurysm, dissection, vasculitis or significant stenosis. Celiac: The celiac axis is widely patent. Large hepatic artery aneurysm remains thrombosed. The aneurysm measures 5.2 x 4.6 cm, decreased compared to 5.7 x 5.0 cm previously. No evidence of vascularization. The left hepatic artery remains patent due to intrahepatic collaterals. Normal appearance of the splenic and gastric arteries. Variant anatomy with the right hepatic artery replaced to the SMA. SMA: Replaced right hepatic artery. Renals: Solitary right renal artery. Two left-sided renal arteries (accessory artery to the lower pole). No aneurysm, dissection, stenosis or changes of FMD. IMA: Patent without evidence of aneurysm, dissection, vasculitis or significant stenosis. Inflow: Patent without evidence of aneurysm, dissection, vasculitis or significant stenosis. Proximal Outflow: Bilateral common femoral and visualized portions of the superficial and profunda femoral arteries are patent without evidence of aneurysm, dissection, vasculitis or significant stenosis. Veins: No focal venous abnormality. Review of the MIP images confirms the above findings. NON-VASCULAR Lower chest: No acute abnormality. Hepatobiliary: No focal liver abnormality is seen. No gallstones, gallbladder wall thickening, or biliary dilatation. Pancreas: Unremarkable. No pancreatic ductal dilatation or surrounding inflammatory changes. Spleen: Normal in size without focal abnormality. Adrenals/Urinary  Tract: Adrenal glands are unremarkable. Kidneys are normal, without renal calculi, focal lesion, or hydronephrosis. Bladder is unremarkable. Stomach/Bowel: Stomach is within normal limits. Appendix appears normal. No evidence of bowel wall thickening, distention, or inflammatory changes. Lymphatic: No suspicious lymphadenopathy. Reproductive: Prostate is unremarkable. Other: No abdominal wall hernia or abnormality. No abdominopelvic ascites. Musculoskeletal: No acute fracture or aggressive appearing lytic or blastic osseous lesion. IMPRESSION: Successful endovascular treatment of a giant left hepatic artery aneurysm. The excluded aneurysm sac is decreasing in size and there is no evidence of vascular flow within the embolized portion of the artery. Additional ancillary findings as above without interval change. Signed, HCriselda Peaches MD, RPVI [facility] Radiology Electronically Signed   By: [name] M.D.   On: 01/25/2022 08:29    Labs:  CBC: No results for input(s): "WBC", "HGB", "HCT", "PLT" in the last 8760 hours.  COAGS: No results for input(s): "INR", "APTT" in the last 8760 hours.  BMP: No results for input(s): "NA", "K", "CL", "CO2", "GLUCOSE", "BUN", "CALCIUM", "CREATININE", "GFRNONAA", "GFRAA" in the last 8760 hours.  Invalid input(s): "CMP"  LIVER FUNCTION TESTS: No results for input(s): "BILITOT", "AST", "ALT", "ALKPHOS", "PROT", "ALBUMIN" in the last 8760 hours.  TUMOR MARKERS: No results for input(s): "AFPTM", "CEA", "CA199", "CHROMGRNA" in the last 8760 hours.  Assessment and Plan:  Very pleasant 57 year old gentleman doing remarkably well 9 years status post coil and liquid embolic embolization of giant hepatic artery aneurysm.  Durable and excellent result of the embolization.  No evidence of recurrent or new aneurysm formation.  Given the durability of the treatment, we can likely extend the interval between  surveillance imaging to 3 years.  1.)  Repeat CT arteriogram of the abdomen and pelvis with accompanying clinic visit in January 2027.    Electronically Signed: Criselda Peaches 02/04/2022, 12:44 PM   I spent a total of  15 Minutes in remote  clinical consultation, greater than 50% of which was counseling/coordinating care for giant hepatic artery aneurysm.    Visit type: Audio only (telephone). Audio (no video) only due to patient preference. Alternative for in-person consultation at [facility], [address] [place], [place], [place]. This visit type was conducted due to national recommendations for restrictions regarding the COVID-19 Pandemic (e.g. social distancing).  This format is felt to be most appropriate for this patient at this time.  All issues noted in this document were discussed and addressed.

## 2022-02-16 DIAGNOSIS — D849 Immunodeficiency, unspecified: Principal | ICD-10-CM

## 2022-02-16 DIAGNOSIS — Z9481 Bone marrow transplant status: Principal | ICD-10-CM

## 2022-02-16 DIAGNOSIS — D89811 Chronic graft-versus-host disease: Principal | ICD-10-CM

## 2022-02-17 ENCOUNTER — Other Ambulatory Visit: Admit: 2022-02-17 | Discharge: 2022-02-18 | Payer: PRIVATE HEALTH INSURANCE

## 2022-02-17 ENCOUNTER — Ambulatory Visit
Admit: 2022-02-17 | Discharge: 2022-02-18 | Payer: PRIVATE HEALTH INSURANCE | Attending: Hematology & Oncology | Primary: Hematology & Oncology

## 2022-02-17 DIAGNOSIS — M3501 Sicca syndrome with keratoconjunctivitis: Principal | ICD-10-CM

## 2022-02-17 DIAGNOSIS — D849 Immunodeficiency, unspecified: Principal | ICD-10-CM

## 2022-02-17 DIAGNOSIS — D89813 Graft-versus-host disease, unspecified: Principal | ICD-10-CM

## 2022-02-17 DIAGNOSIS — Z9481 Bone marrow transplant status: Principal | ICD-10-CM

## 2022-02-17 DIAGNOSIS — D89811 Chronic graft-versus-host disease: Principal | ICD-10-CM

## 2022-02-17 DIAGNOSIS — Z9484 Stem cells transplant status: Principal | ICD-10-CM

## 2022-02-17 DIAGNOSIS — T8609 Other complications of bone marrow transplant: Principal | ICD-10-CM

## 2022-02-17 DIAGNOSIS — C8190 Hodgkin lymphoma, unspecified, unspecified site: Principal | ICD-10-CM

## 2022-02-17 MED ORDER — SULFAMETHOXAZOLE 800 MG-TRIMETHOPRIM 160 MG TABLET
ORAL_TABLET | ORAL | 5 refills | 28 days | Status: CP
Start: 2022-02-17 — End: ?

## 2022-02-17 MED ORDER — ESZOPICLONE 2 MG TABLET
ORAL_TABLET | 2 refills | 0 days | Status: CP
Start: 2022-02-17 — End: ?

## 2022-02-17 MED ORDER — ACYCLOVIR 400 MG TABLET
ORAL_TABLET | Freq: Two times a day (BID) | ORAL | 3 refills | 90 days | Status: CP
Start: 2022-02-17 — End: ?

## 2022-02-24 MED ORDER — METFORMIN 500 MG TABLET
ORAL_TABLET | Freq: Two times a day (BID) | ORAL | 3 refills | 90 days
Start: 2022-02-24 — End: ?

## 2022-02-25 ENCOUNTER — Ambulatory Visit: Admit: 2022-02-25 | Discharge: 2022-02-25 | Payer: PRIVATE HEALTH INSURANCE

## 2022-02-25 ENCOUNTER — Encounter
Admit: 2022-02-25 | Discharge: 2022-02-25 | Payer: PRIVATE HEALTH INSURANCE | Attending: Student in an Organized Health Care Education/Training Program | Primary: Student in an Organized Health Care Education/Training Program

## 2022-02-25 MED ORDER — MOXIFLOXACIN 0.5 % EYE DROPS
Freq: Four times a day (QID) | OPHTHALMIC | 0 refills | 15 days | Status: CP
Start: 2022-02-25 — End: 2022-03-04

## 2022-02-25 MED ORDER — PREDNISOLONE ACETATE 1 % EYE DROPS,SUSPENSION
Freq: Four times a day (QID) | OPHTHALMIC | 1 refills | 50 days | Status: CP
Start: 2022-02-25 — End: 2022-03-27

## 2022-02-26 ENCOUNTER — Ambulatory Visit: Admit: 2022-02-26 | Discharge: 2022-02-27 | Payer: PRIVATE HEALTH INSURANCE

## 2022-02-26 DIAGNOSIS — H25813 Combined forms of age-related cataract, bilateral: Principal | ICD-10-CM

## 2022-03-05 ENCOUNTER — Ambulatory Visit: Admit: 2022-03-05 | Discharge: 2022-03-06 | Payer: PRIVATE HEALTH INSURANCE

## 2022-03-05 DIAGNOSIS — H25813 Combined forms of age-related cataract, bilateral: Principal | ICD-10-CM

## 2022-03-18 DIAGNOSIS — D89813 Graft-versus-host disease, unspecified: Principal | ICD-10-CM

## 2022-03-23 MED ORDER — ACYCLOVIR 400 MG TABLET
ORAL_TABLET | Freq: Two times a day (BID) | ORAL | 3 refills | 0 days
Start: 2022-03-23 — End: ?

## 2022-05-15 MED ORDER — SERTRALINE 100 MG TABLET
ORAL_TABLET | Freq: Every day | ORAL | 3 refills | 90 days
Start: 2022-05-15 — End: ?

## 2022-05-16 MED ORDER — SERTRALINE 100 MG TABLET
ORAL_TABLET | Freq: Every day | ORAL | 3 refills | 90 days | Status: CP
Start: 2022-05-16 — End: ?

## 2022-07-16 NOTE — Unmapped (Addendum)
 Cellular Therapy Research Note:  Date: 07/08/2022    Clinical study: LCCC 1524-ATL: Phase I Study of the Administration of T Lymphocytes Expressing the CD30 Chimeric Antigen Receptor (CAR) for Prevention of Relapse of CD30+ Lymphomas after High Dose Therapy and Autologous Stem Transplantation (ATLAS)    Protocol day: Year 8     Performance Status: kps/ecog: KPS = 80 - Normal activity with effort; some signs or symptoms of disease. / ECOG equivalent 1     Zachary Mathis is a 57 y.o. male with s/p ASCT followed by CD30-directed CAR-T 08/04/14. He is also s/p alloSCT 8/2017under clinical trial LCCC 1524-ATL. Patient is on abbreviated follow-up per protocol.The patient was seen locally on 07/08/2022 for follow up at [facility] by Dr.Balan. (See outside records).       I further confirmed that patient has not had any long-term gene therapy side effects below since their cell therapy under this protocol:   New malignancy(ies)    Exacerbation of a prior or new incidence of a rheumatologic or other autoimmune disorder    New incidence of a hematologic disorder    New incidence of infection (potentially product-related).     Next protocol follow-up will be in 1 year per trial protocol. Patient aware to contact research staff should he have any protocol questions or concerns.    Zachary Mathis  07/16/2022 2:05 PM

## 2022-08-16 MED ORDER — ESZOPICLONE 2 MG TABLET
ORAL_TABLET | 0 refills | 0 days | Status: CP
Start: 2022-08-16 — End: ?

## 2022-08-31 DIAGNOSIS — Z9481 Bone marrow transplant status: Principal | ICD-10-CM

## 2022-08-31 DIAGNOSIS — D89811 Chronic graft-versus-host disease: Principal | ICD-10-CM

## 2022-09-01 ENCOUNTER — Ambulatory Visit
Admit: 2022-09-01 | Discharge: 2022-09-02 | Payer: PRIVATE HEALTH INSURANCE | Attending: Hematology & Oncology | Primary: Hematology & Oncology

## 2022-09-01 ENCOUNTER — Other Ambulatory Visit: Admit: 2022-09-01 | Discharge: 2022-09-02 | Payer: PRIVATE HEALTH INSURANCE

## 2022-09-01 DIAGNOSIS — Z9481 Bone marrow transplant status: Principal | ICD-10-CM

## 2022-09-01 DIAGNOSIS — D89813 Graft-versus-host disease, unspecified: Principal | ICD-10-CM

## 2022-09-01 DIAGNOSIS — T8609 Other complications of bone marrow transplant: Principal | ICD-10-CM

## 2022-09-01 DIAGNOSIS — Z794 Long term (current) use of insulin: Principal | ICD-10-CM

## 2022-09-01 DIAGNOSIS — R059 Cough, unspecified type: Principal | ICD-10-CM

## 2022-09-01 DIAGNOSIS — D89811 Chronic graft-versus-host disease: Principal | ICD-10-CM

## 2022-09-01 DIAGNOSIS — E119 Type 2 diabetes mellitus without complications: Principal | ICD-10-CM

## 2022-09-01 DIAGNOSIS — D849 Immunodeficiency, unspecified: Principal | ICD-10-CM

## 2022-09-01 DIAGNOSIS — Z9484 Stem cells transplant status: Principal | ICD-10-CM

## 2022-09-01 DIAGNOSIS — M3501 Sicca syndrome with keratoconjunctivitis: Principal | ICD-10-CM

## 2022-09-01 DIAGNOSIS — C8190 Hodgkin lymphoma, unspecified, unspecified site: Principal | ICD-10-CM

## 2022-09-01 DIAGNOSIS — C8120 Mixed cellularity classical Hodgkin lymphoma, unspecified site: Principal | ICD-10-CM

## 2022-09-01 MED ORDER — ALBUTEROL SULFATE HFA 90 MCG/ACTUATION AEROSOL INHALER
Freq: Four times a day (QID) | RESPIRATORY_TRACT | 1 refills | 0 days | Status: CP | PRN
Start: 2022-09-01 — End: 2023-09-01

## 2022-09-01 MED ORDER — BENZONATATE 100 MG CAPSULE
ORAL_CAPSULE | Freq: Three times a day (TID) | ORAL | 1 refills | 10 days | Status: CP | PRN
Start: 2022-09-01 — End: 2023-09-01

## 2022-09-09 ENCOUNTER — Ambulatory Visit: Admit: 2022-09-09 | Discharge: 2022-09-10 | Payer: PRIVATE HEALTH INSURANCE

## 2022-10-02 DIAGNOSIS — J984 Other disorders of lung: Principal | ICD-10-CM

## 2022-10-02 DIAGNOSIS — Z9481 Bone marrow transplant status: Principal | ICD-10-CM

## 2022-10-02 DIAGNOSIS — D89811 Chronic graft-versus-host disease: Principal | ICD-10-CM

## 2022-10-02 MED ORDER — MONTELUKAST 10 MG TABLET
ORAL_TABLET | Freq: Every day | ORAL | 3 refills | 30 days | Status: CP
Start: 2022-10-02 — End: 2023-10-02

## 2022-10-02 MED ORDER — FLUTICASONE PROPIONATE 44 MCG/ACTUATION HFA AEROSOL INHALER
Freq: Two times a day (BID) | RESPIRATORY_TRACT | 3 refills | 0 days | Status: CP
Start: 2022-10-02 — End: 2023-10-02

## 2022-10-02 MED ORDER — AZITHROMYCIN 250 MG TABLET
ORAL_TABLET | 3 refills | 0 days | Status: CP
Start: 2022-10-02 — End: ?

## 2022-10-03 DIAGNOSIS — R0602 Shortness of breath: Principal | ICD-10-CM

## 2022-10-03 MED ORDER — MONTELUKAST 10 MG TABLET
ORAL_TABLET | Freq: Every day | ORAL | 1 refills | 90 days
Start: 2022-10-03 — End: ?

## 2022-10-13 ENCOUNTER — Ambulatory Visit
Admit: 2022-10-13 | Discharge: 2022-10-14 | Payer: PRIVATE HEALTH INSURANCE | Attending: Internal Medicine | Primary: Internal Medicine

## 2022-10-13 ENCOUNTER — Ambulatory Visit: Admit: 2022-10-13 | Discharge: 2022-10-14 | Payer: PRIVATE HEALTH INSURANCE

## 2022-10-13 DIAGNOSIS — R1310 Dysphagia, unspecified: Principal | ICD-10-CM

## 2022-10-13 DIAGNOSIS — D89811 Chronic graft-versus-host disease: Principal | ICD-10-CM

## 2022-10-13 DIAGNOSIS — J984 Other disorders of lung: Principal | ICD-10-CM

## 2022-10-13 DIAGNOSIS — Z9481 Bone marrow transplant status: Principal | ICD-10-CM

## 2022-10-13 MED ORDER — PULMICORT FLEXHALER 180 MCG/ACTUATION BREATH ACTIVATED
0 refills | 0 days
Start: 2022-10-13 — End: ?

## 2022-10-13 MED ORDER — BUDESONIDE 180 MCG/ACTUATION BREATH ACTIVATED POWDER INHALER
Freq: Two times a day (BID) | RESPIRATORY_TRACT | 5 refills | 60 days | Status: CP
Start: 2022-10-13 — End: 2023-10-13

## 2022-10-13 MED ORDER — TRELEGY ELLIPTA 200 MCG-62.5 MCG-25 MCG POWDER FOR INHALATION
RESPIRATORY_TRACT | 11 refills | 0 days | Status: CP
Start: 2022-10-13 — End: 2023-10-13

## 2022-10-13 MED ORDER — BECLOMETHASONE DIPROP 80 MCG/ACTUATION HFA BREATH ACTIVATED AEROSOL
Freq: Two times a day (BID) | RESPIRATORY_TRACT | 11 refills | 0 days | Status: CP
Start: 2022-10-13 — End: 2022-10-13

## 2022-10-14 MED ORDER — BENZONATATE 100 MG CAPSULE
ORAL_CAPSULE | Freq: Three times a day (TID) | ORAL | 1 refills | 10 days | PRN
Start: 2022-10-14 — End: 2023-10-14

## 2022-10-14 MED ORDER — AZITHROMYCIN 250 MG TABLET
ORAL_TABLET | Freq: Every day | ORAL | 3 refills | 90 days | Status: CP
Start: 2022-10-14 — End: 2023-10-14

## 2022-10-17 DIAGNOSIS — Z9481 Bone marrow transplant status: Principal | ICD-10-CM

## 2022-10-19 MED ORDER — BENZONATATE 100 MG CAPSULE
ORAL_CAPSULE | Freq: Three times a day (TID) | ORAL | 1 refills | 10 days | Status: CP | PRN
Start: 2022-10-19 — End: 2023-10-19

## 2022-10-20 ENCOUNTER — Ambulatory Visit
Admit: 2022-10-20 | Discharge: 2022-10-21 | Payer: PRIVATE HEALTH INSURANCE | Attending: Hematology & Oncology | Primary: Hematology & Oncology

## 2022-10-20 ENCOUNTER — Other Ambulatory Visit: Admit: 2022-10-20 | Discharge: 2022-10-21 | Payer: PRIVATE HEALTH INSURANCE

## 2022-10-20 DIAGNOSIS — D89811 Chronic graft-versus-host disease: Principal | ICD-10-CM

## 2022-10-20 DIAGNOSIS — Z9484 Stem cells transplant status: Principal | ICD-10-CM

## 2022-10-20 DIAGNOSIS — C8120 Mixed cellularity classical Hodgkin lymphoma, unspecified site: Principal | ICD-10-CM

## 2022-10-20 DIAGNOSIS — D849 Immunodeficiency, unspecified: Principal | ICD-10-CM

## 2022-10-20 DIAGNOSIS — Z9481 Bone marrow transplant status: Principal | ICD-10-CM

## 2022-10-20 MED ORDER — SULFAMETHOXAZOLE 800 MG-TRIMETHOPRIM 160 MG TABLET
ORAL_TABLET | ORAL | 5 refills | 28 days | Status: CP
Start: 2022-10-20 — End: ?

## 2022-10-20 MED ORDER — BELUMOSUDIL 200 MG TABLET
ORAL_TABLET | Freq: Every day | ORAL | 5 refills | 0 days | Status: CP
Start: 2022-10-20 — End: ?
  Filled 2022-11-13: qty 30, 30d supply, fill #0

## 2022-10-20 MED ORDER — BENZONATATE 100 MG CAPSULE
ORAL_CAPSULE | Freq: Three times a day (TID) | ORAL | 1 refills | 90 days | Status: CP | PRN
Start: 2022-10-20 — End: 2023-10-20

## 2022-10-20 MED ORDER — TACROLIMUS 0.5 MG CAPSULE, IMMEDIATE-RELEASE
ORAL_CAPSULE | 3 refills | 0 days | Status: CP
Start: 2022-10-20 — End: ?

## 2022-10-22 DIAGNOSIS — D89811 Chronic graft-versus-host disease: Principal | ICD-10-CM

## 2022-10-24 ENCOUNTER — Ambulatory Visit: Admit: 2022-10-24 | Discharge: 2022-10-25 | Payer: PRIVATE HEALTH INSURANCE

## 2022-10-29 DIAGNOSIS — R1319 Other dysphagia: Principal | ICD-10-CM

## 2022-10-29 DIAGNOSIS — K219 Gastro-esophageal reflux disease without esophagitis: Principal | ICD-10-CM

## 2022-11-12 NOTE — Unmapped (Signed)
[facility] Specialty Medication Onboarding    Specialty Medication: Rezurock  Prior Authorization: Approved   Financial Assistance: Yes - copay card approved as secondary   Final Copay/Day Supply: $0 / 30    Insurance Restrictions: None     Notes to Pharmacist:   Credit Card on File: not applicable    The triage team has completed the benefits investigation and has determined that the patient is able to fill this medication at [facility]. Please contact the patient to complete the onboarding or follow up with the prescribing physician as needed.

## 2022-11-12 NOTE — Unmapped (Signed)
[facility]    Patient Onboarding/Medication Counseling    Mr.Razzak is a 57 y.o. male with chronic GVHD who I am counseling today on initiation of therapy.  I am speaking to the patient.    Was a Nurse, learning disability used for this call? No    Verified patient's date of birth / HIPAA.    Specialty medication(s) to be sent: Hematology/Oncology: Rezurock      Non-specialty medications/supplies to be sent: n/a      Medications not needed at this time: n/a         Rezurock (belumosudil)    The patient declined counseling on medication administration, missed dose instructions, goals of therapy, side effects and monitoring parameters, warnings and precautions, and storage, handling precautions, and disposal because they were counseled in clinic. The information in the declined sections below are for informational purposes only and was not discussed with patient.       Medication & Administration     Dosage: use drop down menu if possible  Graft-vs-host disease, chronic: Oral: 200 mg once daily until progression of chronic graft-vs-host disease that requires new systemic therapy or until unacceptable toxicity  Administration:   Administer with a meal   Take at approximately the same time each day.   Swallow whole; do not cut, crush, or chew tablets.  Adherence/Missed dose instructions:  Take a missed dose as soon as you think about it on the same day you missed the dose.  If you do not think about the missed dose until the next day, skip the missed dose and go back to your normal time.  Do not take more than 1 dose of this drug in the same day  Goals of Therapy     To treat graft versus host disease (GVHD)    Side Effects & Monitoring Parameters   List common side effects:  Fatigue/lack of energy (46%)  Cough (30%), Shortness of breath (33%), nasal congestion (12%), fever (18%)  Swelling (27%)  Bone pain or muscle pain/spasms (22%)  Headache (21%)  Skin rash/Itchy skin (11%)  Nausea, diarrhea, stomach pain, decreased appetite; difficulty swallowing    The following side effects should be reported to the provider:  Signs of an Infection (53%) - viral or bacterial  Allergic reaction  Hypertension (21%) - consistent readings of 150/90 or higher  Unusual bleeding or bruising  Swelling (27%)  Trouble swallowing (16%)  Shortness of breath (33%)    Contraindications, Warnings, & Precautions     Infection, including serious infection, has been reported with belumosudil; however, trials involve patients with chronic graft-vs-host disease currently receiving or who previously received known immunosuppressant agents (standard of care) which are associated with an increased risk of infection, thereby making it difficult to ascertain the risk of infection associated with belumosudil (Ref).  Verify pregnancy status prior to use in patients who may become pregnant.  In utero exposure to belumosudil may cause fetal harm.  Due to the potential for serious adverse reactions in the breastfed infant, breastfeeding is not recommended by the manufacturer during therapy and for at least 1 week after the last belumosudil dose.  Drug/Food Interactions     Medication list reviewed in Epic. The patient was instructed to inform the care team before taking any new medications or supplements.  Patient reports taking Gaviscon. Counseled to take Rezurock in the morning and Gaviscon at night .  Inhibitors of the Proton Pump (PPIs and PCABs): May decrease the serum concentration of  Belumosudil. Increase belumosudil dose to 200 mg BID when use in conjunction with PPIs  Antacids: May decrease the serum concentration of Belumosudil. Management: Consider separating administration of belumosudil and antacids by 2 hours and monitor for reduced belumosudil efficacy.    Storage, Handling Precautions, & Disposal   Store tablets in the original container at room temperature. Keep the cap tightly closed. Do not take out the antimoisture cube or packet.  Store in a dry place. Do not store in a bathroom.  Keep all drugs in a safe place. Keep all drugs out of the reach of children and pets.  Dispose any unused or expired drugs appropriately. Do not flush down a toilet or pour down a drain unless you are told to do so. Check with your pharmacist if you have questions about the best way to throw out drugs. There may be drug take-back programs in your area.      Current Medications (including OTC/herbals), Comorbidities and Allergies     Current Outpatient Medications   Medication Sig Dispense Refill    acyclovir (ZOVIRAX) 400 MG tablet Take 1 tablet (400 mg total) by mouth two (2) times a day. 180 tablet 3    albuterol HFA 90 mcg/actuation inhaler Inhale 2 puffs every six (6) hours as needed for wheezing. 8 g 1    amLODIPine (NORVASC) 5 MG tablet Take 1 tablet (5 mg total) by mouth daily. 90 tablet 3    azelastine-fluticasone 137-50 mcg/spray Spry 1 spray into each nostril Two (2) times a day.      azithromycin (ZITHROMAX) 250 MG tablet Take 1 tablet (250 mg total) by mouth daily. Take one 250mg  tablet on Mondays, Wednesdays, and Fridays (as part of FAM therapy) 90 tablet 3    belumosudil 200 mg Tab Take 1 tablet (200 mg) by mouth daily. 30 tablet 5    benzonatate (TESSALON PERLES) 100 MG capsule Take 1 capsule (100 mg total) by mouth Three (3) times a day as needed for cough. 270 capsule 1    budesonide (PULMICORT) 180 mcg/actuation inhaler Inhale 1 puff two (2) times a day. 1 each 5    calcium carbonate-vitamin D3 600 mg (1,500 mg)-800 unit Chew Chew 1 tablet Two (2) times a day. 100 tablet 0    carboxymethylcellulose sodium (REFRESH CELLUVISC) 1 % DpGe 1 drop every four (4) hours as needed.      carboxymethylcellulose sodium (THERATEARS OPHT) Apply 1 drop to eye every hour.      eszopiclone (LUNESTA) 2 MG Tab TAKE IMMEDIATELY BEFORE BEDTIME 15 tablet 0    fluticasone-umeclidin-vilanter (TRELEGY ELLIPTA) 200-62.5-25 mcg DsDv Inhale 1 puff daily. 28 each 11 FREESTYLE LIBRE 14 DAY SENSOR kit USE AS DIRECTED EVERY 14 DAYS  4    hypromellose (SYSTANE GEL OPHT) Apply to eye.      levothyroxine (SYNTHROID, LEVOTHROID) 300 MCG tablet Take 1 tablet (300 mcg total) by mouth daily.  1    metFORMIN (GLUCOPHAGE) 500 MG tablet TAKE 1 TABLET BY MOUTH TWICE A DAY WITH MEALS 180 tablet 3    metFORMIN (GLUCOPHAGE) 500 MG tablet Take 1 tablet (500 mg total) by mouth in the morning and 1 tablet (500 mg total) in the evening. Take with meals. 180 tablet 3    montelukast (SINGULAIR) 10 mg tablet Take 1 tablet (10 mg total) by mouth daily. 30 tablet 3    multivitamin therapeutic with minerals (THERA-M) 27-0.4 mg Tab Take 1 tablet by mouth daily.      NON FORMULARY  Autologous serum tears , 100% concentration QID for 3 months with 3 refills 15 each 0    NOVOLOG FLEXPEN U-100 INSULIN 100 unit/mL (3 mL) injection pen INJECT 3 TO 8 UNITS 3 TIMES A DAY      ocular lubricant (RESTORE PM) 57.3-42.5 % Oint 1 Application Three (3) times a day.      omega-3 acid ethyl esters (LOVAZA) 1 gram capsule Take 2 capsules (2 g total) by mouth Two (2) times a day. 120 capsule 5    pancrelipase, Lip-Prot-Amyl, (CREON) 24,000-76,000 -120,000 unit CpDR delayed release capsule Take 3 capsules by mouth 3 times a day with meals and 2 capsules 2 times a day with snacks 360 capsule 5    sertraline (ZOLOFT) 100 MG tablet Take 1 tablet (100 mg total) by mouth daily. 90 tablet 3    sulfamethoxazole-trimethoprim (BACTRIM DS) 800-160 mg per tablet TAKE 1 TABLET (160 MG OF TRIMETHOPRIM TOTAL) BY MOUTH EVERY MONDAY, WEDNESDAY, AND FRIDAY. 12 tablet 5    tacrolimus (PROGRAF) 0.5 MG capsule TAKE 7 TABLETS BY MOUTH 2 TIMES DAILY 420 capsule 3    TRESIBA FLEXTOUCH U-100 100 unit/mL (3 mL) InPn INJECT 40 UNITS ONCE A DAY SUBCUTANEOUS      ursodioL (ACTIGALL) 300 mg capsule TAKE 1 CAPSULE BY MOUTH TWO TIMES A DAY. 180 capsule 1     Current Facility-Administered Medications   Medication Dose Route Frequency Provider Last Rate Last Admin    loteprednol (LOTEMAX) 0.5 % ophthalmic suspension 1 drop  1 drop Both Eyes Daily Morck, Debra J         Facility-Administered Medications Ordered in Other Visits   Medication Dose Route Frequency Provider Last Rate Last Admin    alteplase (ACTIVASE) 2 mg injection small catheter clearance             influenza vaccine quad (FLUARIX, FLULAVAL, FLUZONE) (6 MOS & UP) 2020-21 ADS Med             pneumococcal polysacchride (23-valps) (PNEUMOVAX) 25 mcg/0.5 mL vaccine                No Known Allergies    Patient Active Problem List   Diagnosis    Hodgkin lymphoma, mixed cellularity (CMS-HCC)    Hodgkin's lymphoma in relapse (CMS-HCC)    Stem cells transplant status (CMS-HCC)    History of auto stem cell transplant (CMS-HCC)    History of immunotherapy    S/P allogeneic bone marrow transplant (CMS-HCC)    Hypothyroidism (acquired)    Depression    Osteopenia determined by x-ray    Immunosuppressed status (CMS-HCC)    Hypomagnesemia    Graft-versus-host disease, chronic (CMS-HCC)    Keratitis sicca, bilateral    Type 2 diabetes mellitus without complication, with long-term current use of insulin (CMS-HCC)    Binge eating    Osteoporosis    Combined forms of age-related cataract of both eyes       Reviewed and up to date in Epic.    Appropriateness of Therapy     Acute infections noted within Epic:  No active infections  Patient reported infection: None    Is the medication and dose appropriate based on diagnosis, medication list, comorbidities, allergies, medical history, patient???s ability to self-administer the medication, and therapeutic goals? Yes    Prescription has been clinically reviewed: Yes      Baseline Quality of Life Assessment      How many days over the past month did your condition  keep you from your normal activities? For example, brushing your teeth or getting up in the morning. Mostly inhibits sleep because he wakes up coughing. Also gets tired faster .    Financial Information     Medication Assistance provided: Prior Authorization and Copay Assistance    Anticipated copay of $0 reviewed with patient. Verified delivery address.    Delivery Information     Scheduled delivery date: 11/1    Expected start date: 11/1      Medication will be delivered via UPS to the prescription address in Epic [place].  This shipment will not require a signature.      Explained the services we provide at [facility] and that each month we would call to set up refills.  Stressed importance of returning phone calls so that we could ensure they receive their medications in time each month.  Informed patient that we should be setting up refills 7-10 days prior to when they will run out of medication.  A pharmacist will reach out to perform a clinical assessment periodically.  Informed patient that a welcome packet, containing information about our pharmacy and other support services, a Notice of Privacy Practices, and a drug information handout will be sent.      The patient or caregiver noted above participated in the development of this care plan and knows that they can request review of or adjustments to the care plan at any time.      Patient or caregiver verbalized understanding of the above information as well as how to contact the pharmacy at [phone] option 4 with any questions/concerns.  The pharmacy is open Monday through Friday 8:30am-4:30pm.  A pharmacist is available 24/7 via pager to answer any clinical questions they may have.    Patient Specific Needs     Does the patient have any physical, cognitive, or cultural barriers? No    Does the patient have adequate living arrangements? (i.e. the ability to store and take their medication appropriately) Yes    Did you identify any home environmental safety or security hazards? No    Patient prefers to have medications discussed with  Patient     Is the patient or caregiver able to read and understand education materials at a high school level or above? Yes    Patient's primary language is  English     Is the patient high risk? No    SOCIAL DETERMINANTS OF HEALTH     At the [facility], we have learned that life circumstances - like trouble affording food, housing, utilities, or transportation can affect the health of many of our patients.   That is why we wanted to ask: are you currently experiencing any life circumstances that are negatively impacting your health and/or quality of life? Patient declined to answer    Social Determinants of Health     Food Insecurity: Not on file   Internet Connectivity: Not on file   Housing/Utilities: Not on file   Tobacco Use: Medium Risk (10/13/2022)    Patient History     Smoking Tobacco Use: Former     Smokeless Tobacco Use: Never     Passive Exposure: Not on file   Transportation Needs: Not on file   Alcohol Use: Not on file   Interpersonal Safety: Unknown (11/12/2022)    Interpersonal Safety     Unsafe Where You Currently Live: Not on file     Physically Hurt by Anyone: Not on  file     Abused by Anyone: Not on file   Physical Activity: Not on file   Intimate Partner Violence: Not on file   Stress: Not on file   Substance Use: Not on file   Social Connections: Not on file   Financial Resource Strain: Not on file   Depression: Not on file   Health Literacy: Not on file       Would you be willing to receive help with any of the needs that you have identified today? Not applicable       Clydell Hakim, PharmD  [facility] Specialty Pharmacist

## 2022-11-14 ENCOUNTER — Ambulatory Visit: Admit: 2022-11-14 | Discharge: 2022-11-15 | Payer: PRIVATE HEALTH INSURANCE

## 2022-11-14 ENCOUNTER — Ambulatory Visit
Admit: 2022-11-14 | Discharge: 2022-11-15 | Payer: PRIVATE HEALTH INSURANCE | Attending: Speech-Language Pathologist | Primary: Speech-Language Pathologist

## 2022-11-14 MED ADMIN — barium sulfate 60 % (w/v) oral suspension 355 mL: 355 mL | ORAL | @ 18:00:00 | Stop: 2022-11-14

## 2022-11-14 NOTE — Unmapped (Signed)
[facility] ADULT SPEECH THERAPY [place]  OUTPATIENT SPEECH PATHOLOGY  11/14/2022             Patient Name: Zachary Mathis  Date of Birth:1966/01/07     Diagnosis: r/o dysphagia      General:  Date of Evaluation: 11/14/22     Referred by: L. Dudley Major, MD  Reason for Referral: MBSS                  Assessment   Pt presented with a minimal pharyngeal dysphagia. Mild vallecula residue x1 with solid. Residue reduced with subsequent dry swallow and fully cleared with liquid rinse. Overall, BOT retraction appeared functional. No aspiration, penetration or other remarkable pharyngeal residue observed. Consistencies tested: thin liquids, puree and solids. Esophageal swallow screen in AP view: reduced clearance of solid through the thoracic esophagus. Retrograde movement of solid/liquid mixture during liquid rinse    Recommendations:       Diet Liquids Recommendations: Thin Liquids, Level 0  Diet Solids Recommendation: Regular Consistency Solids  Recommended Form of Medications: Whole, With liquid     Recommended Compensatory Techniques : Small sips/bites, Upright 90 degrees, Upright 30 min after meal, Alternate liquids and solids        PLAN:  May benefit from ongoing evaluation and management of esophageal motility as warranted.                Education Provided: Patient (Discussed results and recommendations; Standard reflux precautions)  Response to Education: Understanding verbalized     Session Duration : 28                                     Subjective:  Zachary Mathis is a 57 y.o. male with a PMH significant for hodgkins lymphoma s/p allo-SCT 08/2015 complicated by eye and liver GVHD who developed SOB this spring with a cough. Pt is seen for MBSS to evaluate for dysphagia. He c/o feeling increased pressure when swallowing liquids causing it try to go into his airway 4-6x/week. Denies coughing or choking. He reported having phlegm in his throat and some reflux symptoms recently following lunch. He is on a regular diet.    Communication Preference: Verbal    Barriers to Learning: No Barriers    Medical Tests / Procedures Comments: Barium swallow 10/24/22: Moderate esophageal dysmotility. Tertiary peristalsis along the mid to distal thoracic esophagus. No aspiration seen; however, patient had some coughing when drinking in recumbent positioning.                   Pain?: No        Prior Function: Independent      Past Medical History:   Diagnosis Date    Atherosclerosis     Chemotherapy induced cardiomyopathy (CMS-HCC) 02/01/2013    per Care Everywhere records. ECHO at [facility] w/EF 50-55%,    Depression     DM (diabetes mellitus) (CMS-HCC)     Dry eyes     H/O folliculitis     Reports chronic hx of intermittent folliculitis. Multiple abscesses. Had large right groin abscess in March. Required I/d from surgeon.    Headache     Hepatic artery aneurysm (CMS-HCC)     repaired in 2015    HTN (hypertension)     Hyperlipidemia     Hypothyroidism     Lung nodule     small sub-cm nodules on pre-treatment  CT    Obesity (BMI 30.0-34.9)     Pulmonary fibrosis (CMS-HCC)     With ABVD therapy    Seasonal allergies     Type 2 diabetes mellitus without complication (CMS-HCC) 11/16/2015      Family History   Problem Relation Age of Onset    Aneurysm Mother         AAA    Obesity Mother     Diabetes Father     Cancer Father         Hodgkins    Cancer Brother         unknown primary    Cancer Maternal Uncle         bone. Not sure of primary or mets     Past Surgical History:   Procedure Laterality Date    CENTRAL VENOUS CATHETER INSERTION      EYE SURGERY      hepatic aneurysm repair  01/13/2013    INCISION AND DRAINAGE OF WOUND      Multiple abscesses from folliculitis. Last I/D in March 2016.    IR EMBOLIZATION ARTERIAL OTHER THAN HEMORRHAGE  02/04/2013    IR EMBOLIZATION ARTERIAL OTHER THAN HEMORRHAGE 02/04/2013    MULTIPLE TOOTH EXTRACTIONS  01/14/2015    PR COLONOSCOPY W/BIOPSY SINGLE/MULTIPLE N/A 07/22/2017    Procedure: COLONOSCOPY, FLEXIBLE, PROXIMAL TO SPLENIC FLEXURE; WITH BIOPSY, SINGLE OR MULTIPLE;  Surgeon: Neysa Hotter, MD;  [facility];  Service: Gastroenterology    PR COLSC FLX W/RMVL OF TUMOR POLYP LESION SNARE TQ N/A 07/22/2017    Procedure: COLONOSCOPY FLEX; W/REMOV TUMOR/LES BY SNARE;  Surgeon: Neysa Hotter, MD;  Location: [facility];  Service: Gastroenterology    PR UPPER GI ENDOSCOPY,BIOPSY N/A 07/22/2017    Procedure: UGI ENDOSCOPY; WITH BIOPSY, SINGLE OR MULTIPLE;  Surgeon: Neysa Hotter, MD;  Location: [facility];  Service: Gastroenterology    PR XCAPSL CTRC RMVL INSJ IO LENS PROSTH W/O ECP Right 12/17/2021    Procedure: EXTRACAPSULAR CATARACT REMOVAL W/INSERTION OF INTRAOCULAR LENS PROSTHESIS, MANUAL OR MECHANICAL TECHNIQUE WITHOUT ENDOSCOPIC CYCLOPHOTOCOAGULATION;  Surgeon: Donnel Saxon, MD;  Location: [facility];  Service: Ophthalmology    PR XCAPSL CTRC RMVL INSJ IO LENS PROSTH W/O ECP Left 02/25/2022    Procedure: EXTRACAPSULAR CATARACT REMOVAL W/INSERTION OF INTRAOCULAR LENS PROSTHESIS, MANUAL OR MECHANICAL TECHNIQUE WITHOUT ENDOSCOPIC CYCLOPHOTOCOAGULATION;  Surgeon: Donnel Saxon, MD;  Location: [facility];  Service: Ophthalmology    SUPERFICIAL LYMPH NODE BIOPSY / EXCISION Left 12/31/2012    TONSILLECTOMY      No Known Allergies  Social History     Tobacco Use    Smoking status: Former     Current packs/day: 0.00     Average packs/day: 1 pack/day for 31.0 years (31.0 ttl pk-yrs)     Types: Cigarettes     Start date: 07/06/1981     Quit date: 07/06/2012     Years since quitting: 10.3    Smokeless tobacco: Never   Substance Use Topics    Alcohol use: Not Currently     Alcohol/week: 0.0 standard drinks of alcohol     Comment: rare use. beer.      Current Outpatient Medications   Medication Sig Dispense Refill    acyclovir (ZOVIRAX) 400 MG tablet Take 1 tablet (400 mg total) by mouth two (2) times a day. 180 tablet 3 albuterol HFA 90 mcg/actuation inhaler Inhale 2 puffs every six (6) hours  as needed for wheezing. 8 g 1    amLODIPine (NORVASC) 5 MG tablet Take 1 tablet (5 mg total) by mouth daily. 90 tablet 3    azelastine-fluticasone 137-50 mcg/spray Spry 1 spray into each nostril Two (2) times a day.      azithromycin (ZITHROMAX) 250 MG tablet Take 1 tablet (250 mg total) by mouth daily. Take one 250mg  tablet on Mondays, Wednesdays, and Fridays (as part of FAM therapy) 90 tablet 3    belumosudil 200 mg Tab Take 1 tablet (200 mg) by mouth daily. 30 tablet 5    benzonatate (TESSALON PERLES) 100 MG capsule Take 1 capsule (100 mg total) by mouth Three (3) times a day as needed for cough. 270 capsule 1    budesonide (PULMICORT) 180 mcg/actuation inhaler Inhale 1 puff two (2) times a day. 1 each 5    calcium carbonate (TUMS) 200 mg calcium (500 mg) chewable tablet Chew 1 tablet (500 mg total) daily.      calcium carbonate-vitamin D3 600 mg (1,500 mg)-800 unit Chew Chew 1 tablet Two (2) times a day. 100 tablet 0    carboxymethylcellulose sodium (REFRESH CELLUVISC) 1 % DpGe 1 drop every four (4) hours as needed.      carboxymethylcellulose sodium (THERATEARS OPHT) Apply 1 drop to eye every hour.      eszopiclone (LUNESTA) 2 MG Tab TAKE IMMEDIATELY BEFORE BEDTIME 15 tablet 0    fluticasone-umeclidin-vilanter (TRELEGY ELLIPTA) 200-62.5-25 mcg DsDv Inhale 1 puff daily. 28 each 11    FREESTYLE LIBRE 14 DAY SENSOR kit USE AS DIRECTED EVERY 14 DAYS  4    hypromellose (SYSTANE GEL OPHT) Apply to eye.      levothyroxine (SYNTHROID, LEVOTHROID) 300 MCG tablet Take 1 tablet (300 mcg total) by mouth daily.  1    metFORMIN (GLUCOPHAGE) 500 MG tablet TAKE 1 TABLET BY MOUTH TWICE A DAY WITH MEALS 180 tablet 3    metFORMIN (GLUCOPHAGE) 500 MG tablet Take 1 tablet (500 mg total) by mouth in the morning and 1 tablet (500 mg total) in the evening. Take with meals. 180 tablet 3    montelukast (SINGULAIR) 10 mg tablet Take 1 tablet (10 mg total) by mouth daily. 30 tablet 3    multivitamin therapeutic with minerals (THERA-M) 27-0.4 mg Tab Take 1 tablet by mouth daily.      NON FORMULARY Autologous serum tears , 100% concentration QID for 3 months with 3 refills 15 each 0    NOVOLOG FLEXPEN U-100 INSULIN 100 unit/mL (3 mL) injection pen INJECT 3 TO 8 UNITS 3 TIMES A DAY      ocular lubricant (RESTORE PM) 57.3-42.5 % Oint 1 Application Three (3) times a day.      omega-3 acid ethyl esters (LOVAZA) 1 gram capsule Take 2 capsules (2 g total) by mouth Two (2) times a day. 120 capsule 5    pancrelipase, Lip-Prot-Amyl, (CREON) 24,000-76,000 -120,000 unit CpDR delayed release capsule Take 3 capsules by mouth 3 times a day with meals and 2 capsules 2 times a day with snacks 360 capsule 5    sertraline (ZOLOFT) 100 MG tablet Take 1 tablet (100 mg total) by mouth daily. 90 tablet 3    sulfamethoxazole-trimethoprim (BACTRIM DS) 800-160 mg per tablet TAKE 1 TABLET (160 MG OF TRIMETHOPRIM TOTAL) BY MOUTH EVERY MONDAY, WEDNESDAY, AND FRIDAY. 12 tablet 5    tacrolimus (PROGRAF) 0.5 MG capsule TAKE 7 TABLETS BY MOUTH 2 TIMES DAILY 420 capsule 3    TRESIBA FLEXTOUCH  U-100 100 unit/mL (3 mL) InPn INJECT 40 UNITS ONCE A DAY SUBCUTANEOUS      ursodioL (ACTIGALL) 300 mg capsule TAKE 1 CAPSULE BY MOUTH TWO TIMES A DAY. 180 capsule 1     Current Facility-Administered Medications   Medication Dose Route Frequency Provider Last Rate Last Admin    loteprednol (LOTEMAX) 0.5 % ophthalmic suspension 1 drop  1 drop Both Eyes Daily Morck, Debra J         Facility-Administered Medications Ordered in Other Encounters   Medication Dose Route Frequency Provider Last Rate Last Admin    alteplase (ACTIVASE) 2 mg injection small catheter clearance             barium sulfate 60 % (w/v) oral suspension 355 mL  355 mL Oral Once in imaging Jamesetta Geralds, MD        influenza vaccine quad (FLUARIX, FLULAVAL, FLUZONE) (6 MOS & UP) 2020-21 ADS Med             pneumococcal polysacchride (23-valps) (PNEUMOVAX) 25 mcg/0.5 mL vaccine                  Prior Functional Status:                 Objective    Cognitive-Communication Status : WFL   Oral Mechanism : WFL    Respiratory Status : Room air    Secretions : managed independently; intermittent throat clearing during session      Positioning : Upright in chair, Lateral View, MBSS Chair, AP View    Bolus Presentation : Cup Sip      Oral Consistency 1  Stage of Consistency: Thin Liquids, Level 0  Oral Stage: WFL  Swallow Initiation : WFL  Nasopharyngeal Reflux : None noted  Pharyngeal Stage : WFL  Penetration Aspiration Score: 1 - Material does not enter airway  Aspiration Volume : None  Esophageal Phase Screening : UES Function WFL     Oral Consistency 2  Oral Consistency: Puree- Extremely Thick Level 4  Oral Stage : WFL  Swallow Initiation : Vallecular space swallow initiation  Nasopharyngeal Reflux: None noted  Pharyngeal Stage : WFL  Penetration Aspiration Score : 1 - Material does not enter airway  Aspiration Volume : None  Esophageal Phase Screening : UES Function WFL         Today's Charges (noted here with $$):     SLP Evaluation Charges  $$ [id number]- Motion Fluoro Swallow Eval- Modified [mins]: 28                  I attest that I have reviewed the above information.  Signed: Reuel Boom, CCC-SLP  11/14/2022 11:47 AM

## 2022-11-17 ENCOUNTER — Other Ambulatory Visit: Admit: 2022-11-17 | Discharge: 2022-11-17 | Payer: PRIVATE HEALTH INSURANCE

## 2022-11-17 DIAGNOSIS — Z9481 Bone marrow transplant status: Principal | ICD-10-CM

## 2022-11-17 LAB — CBC W/ AUTO DIFF
BASOPHILS ABSOLUTE COUNT: 0.1 10*9/L (ref 0.0–0.1)
BASOPHILS RELATIVE PERCENT: 1 %
EOSINOPHILS ABSOLUTE COUNT: 0.1 10*9/L (ref 0.0–0.5)
EOSINOPHILS RELATIVE PERCENT: 1.4 %
HEMATOCRIT: 43.8 % (ref 39.0–48.0)
HEMOGLOBIN: 15.1 g/dL (ref 12.9–16.5)
LYMPHOCYTES ABSOLUTE COUNT: 2.6 10*9/L (ref 1.1–3.6)
LYMPHOCYTES RELATIVE PERCENT: 30.4 %
MEAN CORPUSCULAR HEMOGLOBIN CONC: 34.6 g/dL (ref 32.0–36.0)
MEAN CORPUSCULAR HEMOGLOBIN: 35.4 pg — ABNORMAL HIGH (ref 25.9–32.4)
MEAN CORPUSCULAR VOLUME: 102.6 fL — ABNORMAL HIGH (ref 77.6–95.7)
MEAN PLATELET VOLUME: 7.7 fL (ref 6.8–10.7)
MONOCYTES ABSOLUTE COUNT: 0.8 10*9/L (ref 0.3–0.8)
MONOCYTES RELATIVE PERCENT: 9.1 %
NEUTROPHILS ABSOLUTE COUNT: 5 10*9/L (ref 1.8–7.8)
NEUTROPHILS RELATIVE PERCENT: 58.1 %
PLATELET COUNT: 395 10*9/L (ref 150–450)
RED BLOOD CELL COUNT: 4.27 10*12/L (ref 4.26–5.60)
RED CELL DISTRIBUTION WIDTH: 14.5 % (ref 12.2–15.2)
WBC ADJUSTED: 8.6 10*9/L (ref 3.6–11.2)

## 2022-11-17 LAB — COMPREHENSIVE METABOLIC PANEL
ALBUMIN: 3.9 g/dL (ref 3.4–5.0)
ALKALINE PHOSPHATASE: 212 U/L — ABNORMAL HIGH (ref 46–116)
ALT (SGPT): 120 U/L — ABNORMAL HIGH (ref 10–49)
ANION GAP: 5 mmol/L (ref 5–14)
AST (SGOT): 101 U/L — ABNORMAL HIGH (ref <=34)
BILIRUBIN TOTAL: 0.5 mg/dL (ref 0.3–1.2)
BLOOD UREA NITROGEN: 16 mg/dL (ref 9–23)
BUN / CREAT RATIO: 11
CALCIUM: 10.2 mg/dL (ref 8.7–10.4)
CHLORIDE: 107 mmol/L (ref 98–107)
CO2: 28 mmol/L (ref 20.0–31.0)
CREATININE: 1.41 mg/dL — ABNORMAL HIGH
EGFR CKD-EPI (2021) MALE: 58 mL/min/{1.73_m2} — ABNORMAL LOW (ref >=60)
GLUCOSE RANDOM: 153 mg/dL (ref 70–179)
POTASSIUM: 4 mmol/L (ref 3.4–4.8)
PROTEIN TOTAL: 7.6 g/dL (ref 5.7–8.2)
SODIUM: 140 mmol/L (ref 135–145)

## 2022-11-17 LAB — MAGNESIUM: MAGNESIUM: 1.9 mg/dL (ref 1.6–2.6)

## 2022-11-17 MED ADMIN — heparin, porcine (PF) 100 unit/mL injection 500 Units: 500 [IU] | INTRAVENOUS | @ 16:00:00 | Stop: 2022-11-17

## 2022-11-18 DIAGNOSIS — T8609 Other complications of bone marrow transplant: Principal | ICD-10-CM

## 2022-11-18 DIAGNOSIS — D89813 Graft-versus-host disease, unspecified: Principal | ICD-10-CM

## 2022-11-18 MED ORDER — ACYCLOVIR 400 MG TABLET
ORAL_TABLET | Freq: Two times a day (BID) | ORAL | 3 refills | 90 days
Start: 2022-11-18 — End: ?

## 2022-11-18 MED ORDER — METFORMIN 500 MG TABLET
ORAL_TABLET | Freq: Two times a day (BID) | ORAL | 3 refills | 90 days
Start: 2022-11-18 — End: ?

## 2022-11-18 MED ORDER — ESZOPICLONE 2 MG TABLET
ORAL_TABLET | 0 refills | 0 days
Start: 2022-11-18 — End: ?

## 2022-11-30 DIAGNOSIS — C8190 Hodgkin lymphoma, unspecified, unspecified site: Principal | ICD-10-CM

## 2022-11-30 MED ORDER — ACYCLOVIR 400 MG TABLET
ORAL_TABLET | Freq: Two times a day (BID) | ORAL | 3 refills | 90 days | Status: CP
Start: 2022-11-30 — End: ?

## 2022-11-30 MED ORDER — METFORMIN 500 MG TABLET
ORAL_TABLET | Freq: Two times a day (BID) | ORAL | 3 refills | 90 days | Status: CP
Start: 2022-11-30 — End: ?

## 2022-11-30 MED ORDER — ESZOPICLONE 2 MG TABLET
ORAL_TABLET | 0 refills | 0 days | Status: CP
Start: 2022-11-30 — End: ?

## 2022-12-01 ENCOUNTER — Ambulatory Visit: Admit: 2022-12-01 | Discharge: 2022-12-01 | Payer: PRIVATE HEALTH INSURANCE

## 2022-12-03 DIAGNOSIS — C8190 Hodgkin lymphoma, unspecified, unspecified site: Principal | ICD-10-CM

## 2022-12-03 MED ORDER — BENZONATATE 100 MG CAPSULE
ORAL_CAPSULE | Freq: Three times a day (TID) | ORAL | 1 refills | 90 days | Status: CP | PRN
Start: 2022-12-03 — End: 2023-12-03

## 2022-12-03 NOTE — Unmapped (Signed)
[facility] Clinical Assessment & Refill Coordination Note    Zachary Mathis, DOB: 09-Feb-1965  Phone: [phone] (home)     All above HIPAA information was verified with patient.     Was a Nurse, learning disability used for this call? No    Specialty Medication(s):   Hematology/Oncology: Rezurock     Current Outpatient Medications   Medication Sig Dispense Refill    acyclovir (ZOVIRAX) 400 MG tablet Take 1 tablet (400 mg total) by mouth two (2) times a day. 180 tablet 3    albuterol HFA 90 mcg/actuation inhaler Inhale 2 puffs every six (6) hours as needed for wheezing. 8 g 1    amLODIPine (NORVASC) 5 MG tablet Take 1 tablet (5 mg total) by mouth daily. 90 tablet 3    azelastine-fluticasone 137-50 mcg/spray Spry 1 spray into each nostril Two (2) times a day.      azithromycin (ZITHROMAX) 250 MG tablet Take 1 tablet (250 mg total) by mouth daily. Take one 250mg  tablet on Mondays, Wednesdays, and Fridays (as part of FAM therapy) 90 tablet 3    belumosudil 200 mg Tab Take 1 tablet (200 mg) by mouth daily. 30 tablet 5    benzonatate (TESSALON PERLES) 100 MG capsule Take 1 capsule (100 mg total) by mouth Three (3) times a day as needed for cough. 270 capsule 1    budesonide (PULMICORT) 180 mcg/actuation inhaler Inhale 1 puff two (2) times a day. 1 each 5    calcium carbonate (TUMS) 200 mg calcium (500 mg) chewable tablet Chew 1 tablet (500 mg total) daily.      calcium carbonate-vitamin D3 600 mg (1,500 mg)-800 unit Chew Chew 1 tablet Two (2) times a day. 100 tablet 0    carboxymethylcellulose sodium (REFRESH CELLUVISC) 1 % DpGe 1 drop every four (4) hours as needed.      carboxymethylcellulose sodium (THERATEARS OPHT) Apply 1 drop to eye every hour.      eszopiclone (LUNESTA) 2 MG Tab TAKE 1 TABLET BY MOUTH IMMEDIATELY BEFORE BEDTIME 15 tablet 0    fluticasone-umeclidin-vilanter (TRELEGY ELLIPTA) 200-62.5-25 mcg DsDv Inhale 1 puff daily. 28 each 11    FREESTYLE LIBRE 14 DAY SENSOR kit USE AS DIRECTED EVERY 14 DAYS  4    hypromellose (SYSTANE GEL OPHT) Apply to eye.      levothyroxine (SYNTHROID, LEVOTHROID) 300 MCG tablet Take 1 tablet (300 mcg total) by mouth daily.  1    metFORMIN (GLUCOPHAGE) 500 MG tablet TAKE 1 TABLET BY MOUTH TWICE A DAY WITH MEALS 180 tablet 3    metFORMIN (GLUCOPHAGE) 500 MG tablet Take 1 tablet (500 mg total) by mouth in the morning and 1 tablet (500 mg total) in the evening. Take with meals. 180 tablet 3    montelukast (SINGULAIR) 10 mg tablet Take 1 tablet (10 mg total) by mouth daily. 30 tablet 3    multivitamin therapeutic with minerals (THERA-M) 27-0.4 mg Tab Take 1 tablet by mouth daily.      NON FORMULARY Autologous serum tears , 100% concentration QID for 3 months with 3 refills 15 each 0    NOVOLOG FLEXPEN U-100 INSULIN 100 unit/mL (3 mL) injection pen INJECT 3 TO 8 UNITS 3 TIMES A DAY      ocular lubricant (RESTORE PM) 57.3-42.5 % Oint 1 Application Three (3) times a day.      omega-3 acid ethyl esters (LOVAZA) 1 gram capsule Take 2 capsules (2 g total) by mouth Two (2) times  a day. 120 capsule 5    pancrelipase, Lip-Prot-Amyl, (CREON) 24,000-76,000 -120,000 unit CpDR delayed release capsule Take 3 capsules by mouth 3 times a day with meals and 2 capsules 2 times a day with snacks 360 capsule 5    sertraline (ZOLOFT) 100 MG tablet Take 1 tablet (100 mg total) by mouth daily. 90 tablet 3    sulfamethoxazole-trimethoprim (BACTRIM DS) 800-160 mg per tablet TAKE 1 TABLET (160 MG OF TRIMETHOPRIM TOTAL) BY MOUTH EVERY MONDAY, WEDNESDAY, AND FRIDAY. 12 tablet 5    tacrolimus (PROGRAF) 0.5 MG capsule TAKE 7 TABLETS BY MOUTH 2 TIMES DAILY 420 capsule 3    TRESIBA FLEXTOUCH U-100 100 unit/mL (3 mL) InPn INJECT 40 UNITS ONCE A DAY SUBCUTANEOUS      ursodioL (ACTIGALL) 300 mg capsule TAKE 1 CAPSULE BY MOUTH TWO TIMES A DAY. 180 capsule 1     Current Facility-Administered Medications   Medication Dose Route Frequency Provider Last Rate Last Admin    loteprednol (LOTEMAX) 0.5 % ophthalmic suspension 1 drop  1 drop Both Eyes Daily Morck, Debra J         Facility-Administered Medications Ordered in Other Visits   Medication Dose Route Frequency Provider Last Rate Last Admin    alteplase (ACTIVASE) 2 mg injection small catheter clearance             influenza vaccine quad (FLUARIX, FLULAVAL, FLUZONE) (6 MOS & UP) 2020-21 ADS Med             pneumococcal polysacchride (23-valps) (PNEUMOVAX) 25 mcg/0.5 mL vaccine                 Changes to medications: Fishel reports no changes at this time.    No Known Allergies    Changes to allergies: No    SPECIALTY MEDICATION ADHERENCE     Rezurock 200 mg: 16 doses of medicine on hand     Medication Adherence    Patient reported X missed doses in the last month: 0  Specialty Medication: Rezurock 200 mg once daily  Informant: patient  Confirmed plan for next specialty medication refill: delivery by pharmacy  Refills needed for supportive medications: not needed          Specialty medication(s) dose(s) confirmed: Regimen is correct and unchanged.     Are there any concerns with adherence? No    Adherence counseling provided? Not needed    CLINICAL MANAGEMENT AND INTERVENTION      Clinical Benefit Assessment:    Do you feel the medicine is effective or helping your condition? Yes    Clinical Benefit counseling provided? Not needed    Adverse Effects Assessment:    Are you experiencing any side effects? No    Are you experiencing difficulty administering your medicine? No    Quality of Life Assessment:    Quality of Life    Rheumatology  Oncology  1. What impact has your specialty medication had on the reduction of your daily pain or discomfort level?: None  2. On a scale of 1-10, how would you rate your ability to manage side effects associated with your specialty medication? (1=no issues, 10 = unable to take medication due to side effects): 1  Dermatology  Cystic Fibrosis          How many days over the past month did your GVHD  keep you from your normal activities? For example, brushing your teeth or getting up in the morning. 0    Have you discussed  this with your provider? Not needed    Acute Infection Status:    Acute infections noted within Epic:  No active infections  Patient reported infection: None    Therapy Appropriateness:    Is therapy appropriate based on current medication list, adverse reactions, adherence, clinical benefit and progress toward achieving therapeutic goals? Yes, therapy is appropriate and should be continued     DISEASE/MEDICATION-SPECIFIC INFORMATION      N/A    Oncology: Is the patient receiving adequate infection prevention treatment? Not applicable  Does the patient have adequate nutritional support? Not applicable    PATIENT SPECIFIC NEEDS     Does the patient have any physical, cognitive, or cultural barriers? No    Is the patient high risk? No    Did the patient require a clinical intervention? No    Does the patient require physician intervention or other additional services (i.e., nutrition, smoking cessation, social work)? No    SOCIAL DETERMINANTS OF HEALTH     At the [facility], we have learned that life circumstances - like trouble affording food, housing, utilities, or transportation can affect the health of many of our patients.   That is why we wanted to ask: are you currently experiencing any life circumstances that are negatively impacting your health and/or quality of life? Patient declined to answer    Social Drivers of Health     Food Insecurity: Not on file   Internet Connectivity: Not on file   Housing/Utilities: Not on file   Tobacco Use: Medium Risk (10/13/2022)    Patient History     Smoking Tobacco Use: Former     Smokeless Tobacco Use: Never     Passive Exposure: Not on file   Transportation Needs: Not on file   Alcohol Use: Not on file   Interpersonal Safety: Not on file   Physical Activity: Not on file   Intimate Partner Violence: Not on file   Stress: Not on file   Substance Use: Not on file (11/17/2022)   Social Connections: Not on file   Financial Resource Strain: Not on file   Depression: Not on file   Health Literacy: Not on file       Would you be willing to receive help with any of the needs that you have identified today? Not applicable       SHIPPING     Specialty Medication(s) to be Shipped:   Hematology/Oncology: Rezurock    Other medication(s) to be shipped: No additional medications requested for fill at this time     Changes to insurance: No    Delivery Scheduled: Yes, Expected medication delivery date: 01/09/23 (Scheduled delivery even though patient has 16 days of meds on hand because he leaves for work trip out of the country on 12/13/22 and will be gone the full first week of December and therefore will run out of medication.     Medication will be delivered via UPS to the confirmed prescription address in [facility].    The patient will receive a drug information handout for each medication shipped and additional FDA Medication Guides as required.  Verified that patient has previously received a Conservation officer, historic buildings and a Surveyor, mining.    The patient or caregiver noted above participated in the development of this care plan and knows that they can request review of or adjustments to the care plan at any time.      All of the patient's questions and concerns have  been addressed.    Kermit Balo, [facility]   [facility] Specialty Pharmacist

## 2022-12-05 DIAGNOSIS — D89813 Graft-versus-host disease, unspecified: Principal | ICD-10-CM

## 2022-12-05 MED ORDER — JAKAFI 10 MG TABLET
ORAL_TABLET | Freq: Two times a day (BID) | ORAL | 6 refills | 30 days | Status: CP
Start: 2022-12-05 — End: ?

## 2022-12-08 ENCOUNTER — Ambulatory Visit: Admit: 2022-12-08 | Discharge: 2022-12-09 | Payer: PRIVATE HEALTH INSURANCE

## 2022-12-08 DIAGNOSIS — H0100B Unspecified blepharitis left eye, upper and lower eyelids: Principal | ICD-10-CM

## 2022-12-08 DIAGNOSIS — H02889 Meibomian gland dysfunction of unspecified eye, unspecified eyelid: Principal | ICD-10-CM

## 2022-12-08 DIAGNOSIS — Z9841 Cataract extraction status, right eye: Principal | ICD-10-CM

## 2022-12-08 DIAGNOSIS — Z961 Presence of intraocular lens: Principal | ICD-10-CM

## 2022-12-08 DIAGNOSIS — H16223 Keratoconjunctivitis sicca, not specified as Sjogren's, bilateral: Principal | ICD-10-CM

## 2022-12-08 DIAGNOSIS — H0100A Unspecified blepharitis right eye, upper and lower eyelids: Principal | ICD-10-CM

## 2022-12-08 DIAGNOSIS — Z9842 Cataract extraction status, left eye: Principal | ICD-10-CM

## 2022-12-08 DIAGNOSIS — D89811 Chronic graft-versus-host disease: Principal | ICD-10-CM

## 2022-12-08 NOTE — Unmapped (Signed)
Assessment:     Diagnosis ICD-10-CM Associated Orders   1. Keratitis sicca, bilateral  H16.223       2. 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 Meibomian gland dysfunction (MGD)  H02.889       4. S/P cataract extraction and insertion of intraocular lens, left  Z98.42     Z96.1       5. S/P cataract extraction and insertion of intraocular lens, right  Z98.41     Z96.1           57 y.o. male with past medical history of hypothyroidism, DM II, Hodgkin lymphoma s/p BMT, and depression.    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 Georgeann Oppenheim  #s/p CE/IOL left eye 02/25/2022 with Dr. Georgeann Oppenheim    LTFU after POW1 visit after left eye cat sx. Vision overall stable. Mild improvement in vision right eye with Mrx but pt wishes to be independent of glasses. Dry eye still persistent despite aggressive regimen although pt overall tolerating well.    Current tx:  - Serum tears 4x/day 100%  - PFATs 4-6 times a day  - Refresh PM at bedtime (intermittently)    - fish oil (omega 3) supplements 4g/day  - Lotemax 1-2 x day      Plan    - Continue current regimen as above. Serum tears ordered again  - eyelid hygiene for blepharitis  - Punctal plugs placed in upper and lower lids both eyes (4 plugs total)  - RTC 4-6 months

## 2022-12-09 MED FILL — REZUROCK 200 MG TABLET: ORAL | 30 days supply | Qty: 30 | Fill #1

## 2022-12-22 ENCOUNTER — Ambulatory Visit
Admit: 2022-12-22 | Discharge: 2022-12-23 | Payer: PRIVATE HEALTH INSURANCE | Attending: Internal Medicine | Primary: Internal Medicine

## 2022-12-22 DIAGNOSIS — J984 Other disorders of lung: Principal | ICD-10-CM

## 2022-12-22 DIAGNOSIS — D89811 Chronic graft-versus-host disease: Principal | ICD-10-CM

## 2022-12-22 DIAGNOSIS — Z9481 Bone marrow transplant status: Principal | ICD-10-CM

## 2022-12-22 MED ORDER — ESOMEPRAZOLE MAGNESIUM 40 MG CAPSULE,DELAYED RELEASE
ORAL_CAPSULE | Freq: Every day | ORAL | 3 refills | 90.00 days | Status: CP
Start: 2022-12-22 — End: 2023-12-22

## 2022-12-22 NOTE — Unmapped (Signed)
Referring Physician: Dr Oswaldo Done    Chief Complaint: GVHD complications    HPI   56yo male with a PMH significant for hodgkins lymphoma s/p allo-SCT 08/2015 complicated by eye and liver GVHD who developed SOB this spring with a cough. The SOB was notable in the spring when he went to the beach with his family and could not keep up on walks and tired going up and down the house steps. He denies any inciting event such as an infection or an exposures. He feels it has been gradually getting worse. Some improvement with albuterol. Has a cough with it but not productive    Today  - cough improved with trelegy  - has been doing gaviscon tablets not liquid  - feels the reflux    ROS   - the balance of 10 systems is negative other than noted above     PMH   - Hodgkins Lymphoma s/p allo stem cell transplant    --> diagnoised 2014 s/p ABVD, ICE    --> auto SCT 07/2014, CAR-T    --> Allo SCT 08/2015    --> GVHD in eyes, liver  - Pancreatic Insuffiencey    FH   - No lung disease in family    SH   - Never smoker  - no work exposures  - no birds    MEDS (personally reviewed in EPIC, pertinent meds noted below)     PHYSICAL EXAM   Vitals - BP 130/76 (BP Site: L Arm, BP Position: Sitting, BP Cuff Size: Medium)  - Pulse 76  - Wt 81.6 kg (180 lb)  - SpO2 93%  - BMI 26.57 kg/m??   Gen - awake, alert, in NAD   Derm - no rash   HEENT - no adenopathy, TMs clear   Vascular - good pulses throughout, no JVP   CV - RRR,no murmers or gallops   Pulm - CTA bilaterally   Abd - soft, NT, ND, +BS, no hepatosplenomegally   Ext - no edema, no clubbing   Joints - no enlarged joints, no warmth, redness or effusions   Neuro - CN grossly intact, nl gait     RESULTS     Labs (reviewd in EPIC, pertinent values noted below)       PFTs (personally reveiewed and interpreted)     Date: FVC (% Pred) FEV1 (% Pred) FEF25-75(% Pred) DLCO TBBx/Results                            12/01/22  3.74  1.74   63%     09/09/22  3.62 (83%)  1.76 (52%)    Ratio <LLN, no BD response 6 Minute Walk       CT Chest (personally reviewed and interpretted)   - Airway inflammation with no interstitial lung disease or bronchiectasis         A/P   57yo male with a PMH significant for hodgkins lymphoma s/p allo-SCT 08/2015 complicated by eye and liver GVHD with new obstructive lung disease    Obstructive lung disease  - HRCT scan consistent with airway inflammation and gas trapping  - PFTs with moderate obstructive lung disease with no BD response  - ECHO 2021 with normal EF and no pulm HTN  - Known SCT with GVHD  - GERD as below  - Overall, 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 unable to get insurance approval    --> will continue azithro    --> will continue singulair    --> agree with prograf    --> agree with Earvin Hansen    --> Started rezourock 200mg , 1 month into it    GERD/ERD  - he does note some aspiration symptoms  - modified barium swallow OK  - barium swallow with dysmotility    --> will get pH/Mano  - behavioral modifications discussed  - gaviscon liquid rather than tablets  - will start nexium  - GI referral    PCP Proph:  - bactrim    [facility]  - Will defer to PCP    Vaccines  - Influenza: 09/2022  - RSV: not eligible  - TDAP: 12/2016  - Prevnar 13: 12/2016  - PNA 23: 10/2017... will give prevnar 20 next visit, out today  - Shinrix: complete 12/2016  - COVID: 09/2022    Complex medical decision making was done as we adjust medications based on blood work/drug levels and multiple complaints and problems were addresed

## 2022-12-22 NOTE — Unmapped (Signed)
-   follow-up as instructed    - for questions please reach out to my nurse Liesl:    --> Phone: [phone]    --> Fax: [phone]

## 2023-01-17 DIAGNOSIS — D89811 Chronic graft-versus-host disease: Principal | ICD-10-CM

## 2023-01-19 ENCOUNTER — Ambulatory Visit
Admit: 2023-01-19 | Discharge: 2023-01-20 | Payer: PRIVATE HEALTH INSURANCE | Attending: Hematology & Oncology | Primary: Hematology & Oncology

## 2023-01-19 ENCOUNTER — Other Ambulatory Visit: Admit: 2023-01-19 | Discharge: 2023-01-20 | Payer: PRIVATE HEALTH INSURANCE

## 2023-01-19 DIAGNOSIS — H16223 Keratoconjunctivitis sicca, not specified as Sjogren's, bilateral: Principal | ICD-10-CM

## 2023-01-19 DIAGNOSIS — D89811 Chronic graft-versus-host disease: Principal | ICD-10-CM

## 2023-01-19 DIAGNOSIS — C8120 Mixed cellularity classical Hodgkin lymphoma, unspecified site: Principal | ICD-10-CM

## 2023-01-19 DIAGNOSIS — E039 Hypothyroidism, unspecified: Principal | ICD-10-CM

## 2023-01-19 DIAGNOSIS — D849 Immunodeficiency, unspecified: Principal | ICD-10-CM

## 2023-01-19 DIAGNOSIS — K219 Gastro-esophageal reflux disease without esophagitis: Principal | ICD-10-CM

## 2023-01-19 DIAGNOSIS — C8190 Hodgkin lymphoma, unspecified, unspecified site: Principal | ICD-10-CM

## 2023-01-19 DIAGNOSIS — Z9484 Stem cells transplant status: Principal | ICD-10-CM

## 2023-01-19 DIAGNOSIS — Z9481 Bone marrow transplant status: Principal | ICD-10-CM

## 2023-01-19 LAB — COMPREHENSIVE METABOLIC PANEL
ALBUMIN: 3.6 g/dL (ref 3.4–5.0)
ALKALINE PHOSPHATASE: 184 U/L — ABNORMAL HIGH (ref 46–116)
ALT (SGPT): 82 U/L — ABNORMAL HIGH (ref 10–49)
ANION GAP: 12 mmol/L (ref 5–14)
AST (SGOT): 37 U/L — ABNORMAL HIGH (ref <=34)
BILIRUBIN TOTAL: 0.6 mg/dL (ref 0.3–1.2)
BLOOD UREA NITROGEN: 20 mg/dL (ref 9–23)
BUN / CREAT RATIO: 14
CALCIUM: 9.6 mg/dL (ref 8.7–10.4)
CHLORIDE: 98 mmol/L (ref 98–107)
CO2: 28 mmol/L (ref 20.0–31.0)
CREATININE: 1.42 mg/dL — ABNORMAL HIGH (ref 0.73–1.18)
EGFR CKD-EPI (2021) MALE: 58 mL/min/{1.73_m2} — ABNORMAL LOW (ref >=60)
GLUCOSE RANDOM: 134 mg/dL (ref 70–179)
POTASSIUM: 3.7 mmol/L (ref 3.4–4.8)
PROTEIN TOTAL: 7.1 g/dL (ref 5.7–8.2)
SODIUM: 138 mmol/L (ref 135–145)

## 2023-01-19 LAB — CBC W/ AUTO DIFF
BASOPHILS ABSOLUTE COUNT: 0.1 10*9/L (ref 0.0–0.1)
BASOPHILS RELATIVE PERCENT: 1.1 %
EOSINOPHILS ABSOLUTE COUNT: 0.2 10*9/L (ref 0.0–0.5)
EOSINOPHILS RELATIVE PERCENT: 2.2 %
HEMATOCRIT: 41.9 % (ref 39.0–48.0)
HEMOGLOBIN: 14.9 g/dL (ref 12.9–16.5)
LYMPHOCYTES ABSOLUTE COUNT: 3.3 10*9/L (ref 1.1–3.6)
LYMPHOCYTES RELATIVE PERCENT: 37 %
MEAN CORPUSCULAR HEMOGLOBIN CONC: 35.5 g/dL (ref 32.0–36.0)
MEAN CORPUSCULAR HEMOGLOBIN: 36.4 pg — ABNORMAL HIGH (ref 25.9–32.4)
MEAN CORPUSCULAR VOLUME: 102.6 fL — ABNORMAL HIGH (ref 77.6–95.7)
MEAN PLATELET VOLUME: 8.2 fL (ref 6.8–10.7)
MONOCYTES ABSOLUTE COUNT: 0.6 10*9/L (ref 0.3–0.8)
MONOCYTES RELATIVE PERCENT: 6.5 %
NEUTROPHILS ABSOLUTE COUNT: 4.7 10*9/L (ref 1.8–7.8)
NEUTROPHILS RELATIVE PERCENT: 53.2 %
PLATELET COUNT: 320 10*9/L (ref 150–450)
RED BLOOD CELL COUNT: 4.08 10*12/L — ABNORMAL LOW (ref 4.26–5.60)
RED CELL DISTRIBUTION WIDTH: 14.4 % (ref 12.2–15.2)
WBC ADJUSTED: 8.8 10*9/L (ref 3.6–11.2)

## 2023-01-19 LAB — BILIRUBIN, DIRECT: BILIRUBIN DIRECT: 0.2 mg/dL (ref 0.00–0.30)

## 2023-01-19 LAB — TACROLIMUS LEVEL, TROUGH: TACROLIMUS, TROUGH: 15.4 ng/mL — ABNORMAL HIGH (ref 5.0–15.0)

## 2023-01-20 NOTE — Unmapped (Signed)
[facility]  Immunosuppressive Therapy Note    Zachary Mathis is a 58 y.o. 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 Zachary Mathis is currently day +3107 (BMT Unknown Phase - Planned 07/18/2014), 2700 (BMT Unknown Phase - Planned 08/29/2015).    Current dose: Tacrolimus 3.5 mg PO twice daily (dose increased on 12/12/19)    Goal tacrolimus Level: 5-10 ng/mL    Resulted level: 15.4 ng/mL, at 09:17 am ON 01/19/23. Patient confirmed did not take dose prior to getting alb drawn, however did state that on 01/18/23 he took his first dose of they day in the evening ans ~5 hours later took a second dose.     Lab Results   Component Value Date/Time    TACROLIMUS 15.4 (H) 01/19/2023 09:17 AM    TACROLIMUS 5.6 10/20/2022 08:45 AM    TACROLIMUS 3.6 (L) 09/01/2022 08:59 AM      Lab Results   Component Value Date/Time    CREATININE 1.42 (H) 01/19/2023 09:17 AM    CREATININE 1.41 (H) 11/17/2022 07:56 AM    CREATININE 1.50 (H) 10/20/2022 08:45 AM     Recommendation:   Tacrolimus level today is supratherapeutic likely secondary to doubling doses yesterday evening. Patient has been stable on current regimen   Scr remains stable from last check.  Tbili WNL, LFTs slightly elevated but improved from last check  Given above, will plan to hold this evening dose, and resume tacrolimus 3.5 mg PO BID on 1/7 AM. Plan to recheck level in a week at local lab. Contacted Mr. Speirs to discuss plan and he verbalized understanding.     We will continue to monitor levels. Patient will be followed for changes in renal and hepatic function, toxicity, and efficacy.     Priscille Heidelberg, PharmD  PGY2 Oncology Pharmacy Resident     I have reviewed and agree with the resident's assessment/plan. I was available for additional questions.     Lonna Cobb, PharmD, BCPS, BCOP, CPP  [facility]

## 2023-01-23 DIAGNOSIS — C8190 Hodgkin lymphoma, unspecified, unspecified site: Principal | ICD-10-CM

## 2023-01-23 NOTE — Unmapped (Signed)
BMTCT Routine Follow-up Clinic Note:    Patient Name: Zachary Mathis  Medical Record Number:  [id number]  Encounter Date: 01/19/2023    BMT Attending MD: Alverda Skeans, MD    Allogeneic SCT: 08/29/15 [10/10, CMV+, ABO matched, male]  CAR T cell infusion [[id number]]: 08/04/14  Autologous SCT: 07/18/14    HPI:   Zachary Mathis is a 58 y.o. male with a diagnosis of Hodgkins Lymphoma.  Salatiel now presents for routine follow-up post allogeneic stem cell transplant. His course has been complicated by chronic GvHD involving the mucous membranes, eyes, and now lung.     He was last hospitalized after transfer from OSH [7/7 - 07/23/17] with DKA and an acute drop in hgb which were thought to be r/t GI bleeding. During his admission, he had colonoscopy and EGD which did not reveal an actively bleeding sites but revealed multiple non bleeding duodenal ulcers. His pathology was not c/w GHVD or viral cytopathic effect.        He has been seen Dr. Mitzi Davenport and Dr. Zachery Dauer for persistent diarrhea, where workup revealed a low stool elastase and he was diagnosed with pancreatic insufficiency. This has now resolved with pancreatic enzyme supplementation. His still has loose stools intermittently for which he takes Imodium prn. He has also seen Dr. Piedad Climes with Hepatology who has prescribed ursodiol and will continue to follow. He is now followed in the BMT clinic to monitor his cGVHD [eyes have been active for some time, now also with pulmonary involvement], immune suppression levels, viral and immune monitoring, and chimerism monitoring.     For DM, he is continuing therapy with an insulin pump + monitoring. Followed by his endocrinologist for this.       Interval History:   Since his last visit, Mr. Huyck cough and dyspnea are considerably improved. Appreciate the assistance of Dr. Dudley Major in helping to optimize his medical management of pulmonary cGvHD. He is more active and feeling generally better, and he was able to travel for work to [facility]. Lung exam is showing improvement in air movement as well (with no wheezing, rhonchi, or chest tightness).  He continues to work full time. Ocular cGvHD symtoms persist and are not substantially improved over prior with the use of autologous serum tears - However his symptoms also do not seem to be worsening. He is able to work but occasionally limited by having to rest his eyes for a time to recover, also able to drive mostly but occasionally unable to drive as well, somewhat unchanged. Has to wear goggles when driving a convertible due to ocular sun sensitivity. He is using his serum drops that help temporarily but then his dryness returns after a few hours - Plans to continue these. His ophthalmologist is continuing to follow him for cataract care as well - He had right eye cataract removal surgery in December 2023, after which he recovered well and is doing well with vision 20/25 in his right eye.      No worsened GI symptoms including dysphagia or odynophagia. He is eating better and now putting on some weight. No oral mucosal or skin symptoms. Occasional reflux symptoms may be worsening.     He is now on tacrolimus and we are continuing to follow levels - currently at 3.5mg  po bid, which has been a stable dose for him for over a year.  Appetite remains good and he is more active (unchanged from most recent visits).  No change to stooling.  No  nausea.  His endocrinologist has switched him to a continuous insulin pump which he is tolerating well and is controlling his blood glucose levels better.     EBV was detectable in the peripheral blood (< 100) on 09/12/19 -> ND since.      ROS:   PMH and PSH have been reviewed and there are no changes.      SocHx:  There are no pertinent changes to [facility].  The patient is residing at home and he continues to work. Has also been playing music with a group for recreation.     Hematology/Oncology History   Hodgkin lymphoma, mixed cellularity (CMS-HCC) 12/13/2012 Initial Diagnosis    Hodgkin lymphoma, mixed cellularity (RAF-HCC).  Stage at initial diagnosis: IIIB (bone marrow negative, ESR > 50, LDH 297, alb < 4.0, age 3)     12/13/2012 - 08/12/2013 Chemotherapy    6 cycles of ABVD. 1. Small sub-cm lung nodules on pre-treatment CT, not thought to represent lymphoma; 2. Pulmonary fibrosis with ABVD     12/31/2012 Biopsy    Lymph node, left supraclavicular, excision - Classical Hodgkin lymphoma, mixed cellularity subtype     01/25/2013 Biopsy    Normocellular bone marrow (60%) with trilineage hematopoiesis; negative for lymphoma     12/13/2013 -  Other    Patient discovered a palpable L supraclavicular node     01/26/2014 -  Cancer Staged    Deauville 5: Avid lymphoma in cervical, left axillary, mediastinal, left hilar, and periaortic nodes.      01/26/2014 Progression    PET/CT interpreted as lymphoma progression in the neck, chest, and abdomen/pelvis (along with hepatic steatosis and coronary artery calcifications).       02/13/2014 - 05/15/2014 Chemotherapy    Started ICE therapy in February with last cycle in May 2016. Received 4 cycles.      06/05/2014 Biopsy    Mildly hypercellular bone marrow (60%) with trilineage hematopoiesis.  No morphologic evidence of classical Hodgkin lymphoma     06/06/2014 -  Cancer Staged    PET/CT: Response to therapy of hypermetabolic adenopathy since 04/07/2016CT and 01/26/2014 PET. No residual malignant range activityidentified.2. No new sites of disease.3. Hepatic steatosis.4. Age advanced coronary artery atherosclerosis.      06/22/2014 - 06/23/2014 Chemotherapy    Stem cell mobilization with etoposide.     07/03/2014 -  Other    Stem cell collection.  CD34+DOSECOLLECT: 6.80      07/07/2014 -  Cancer Staged    CT scan: IMPRESSION: 1. Slightly decreased size of axillary, mediastinal, and retroperitoneal lymphadenopathy. 2. Hepatic steatosis.      07/18/2014 Bone Marrow Transplant    Autologus stem cell transplant     08/04/2014 Bone Marrow Transplant    CART infusion     01/30/2015 Progression    Mediastinal Relapse     02/21/2015 - 04/12/2015 Chemotherapy    3 cycles of bendamustine + brentuximab       Allergies:  Patient has no known allergies.     Medications:    Current Outpatient Medications on File Prior to Visit   Medication Sig    acyclovir (ZOVIRAX) 400 MG tablet Take 1 tablet (400 mg total) by mouth two (2) times a day.    albuterol HFA 90 mcg/actuation inhaler Inhale 2 puffs every six (6) hours as needed for wheezing.    amLODIPine (NORVASC) 5 MG tablet Take 1 tablet (5 mg total) by mouth daily.    azelastine-fluticasone 137-50 mcg/spray Spry 1 spray  into each nostril Two (2) times a day.    azithromycin (ZITHROMAX) 250 MG tablet Take 1 tablet (250 mg total) by mouth daily. Take one 250mg  tablet on Mondays, Wednesdays, and Fridays (as part of FAM therapy)    belumosudil 200 mg Tab Take 1 tablet (200 mg) by mouth daily.    benzonatate (TESSALON PERLES) 100 MG capsule Take 1 capsule (100 mg total) by mouth Three (3) times a day as needed for cough.    budesonide (PULMICORT) 180 mcg/actuation inhaler Inhale 1 puff two (2) times a day.    calcium carbonate (TUMS) 200 mg calcium (500 mg) chewable tablet Chew 1 tablet (500 mg total) daily.    calcium carbonate-vitamin D3 600 mg (1,500 mg)-800 unit Chew Chew 1 tablet Two (2) times a day.    carboxymethylcellulose sodium (REFRESH CELLUVISC) 1 % DpGe 1 drop every four (4) hours as needed.    carboxymethylcellulose sodium (THERATEARS OPHT) Apply 1 drop to eye every hour.    esomeprazole (NEXIUM) 40 MG capsule Take 1 capsule (40 mg total) by mouth daily before breakfast.    eszopiclone (LUNESTA) 2 MG Tab TAKE 1 TABLET BY MOUTH IMMEDIATELY BEFORE BEDTIME    fluticasone-umeclidin-vilanter (TRELEGY ELLIPTA) 200-62.5-25 mcg DsDv Inhale 1 puff daily.    FREESTYLE LIBRE 14 DAY SENSOR kit USE AS DIRECTED EVERY 14 DAYS    hypromellose (SYSTANE GEL OPHT) Apply to eye.    JAKAFI 10 mg tablet TAKE 1 TABLET BY MOUTH 2 TIMES A DAY    levothyroxine (SYNTHROID, LEVOTHROID) 300 MCG tablet Take 1 tablet (300 mcg total) by mouth daily.    metFORMIN (GLUCOPHAGE) 500 MG tablet TAKE 1 TABLET BY MOUTH TWICE A DAY WITH MEALS    metFORMIN (GLUCOPHAGE) 500 MG tablet Take 1 tablet (500 mg total) by mouth in the morning and 1 tablet (500 mg total) in the evening. Take with meals.    montelukast (SINGULAIR) 10 mg tablet Take 1 tablet (10 mg total) by mouth daily.    multivitamin therapeutic with minerals (THERA-M) 27-0.4 mg Tab Take 1 tablet by mouth daily.    NON FORMULARY Autologous serum tears , 100% concentration QID for 3 months with 3 refills    NOVOLOG FLEXPEN U-100 INSULIN 100 unit/mL (3 mL) injection pen INJECT 3 TO 8 UNITS 3 TIMES A DAY    ocular lubricant (RESTORE PM) 57.3-42.5 % Oint 1 Application Three (3) times a day.    omega-3 acid ethyl esters (LOVAZA) 1 gram capsule Take 2 capsules (2 g total) by mouth Two (2) times a day.    pancrelipase, Lip-Prot-Amyl, (CREON) 24,000-76,000 -120,000 unit CpDR delayed release capsule Take 3 capsules by mouth 3 times a day with meals and 2 capsules 2 times a day with snacks    sertraline (ZOLOFT) 100 MG tablet Take 1 tablet (100 mg total) by mouth daily.    sulfamethoxazole-trimethoprim (BACTRIM DS) 800-160 mg per tablet TAKE 1 TABLET (160 MG OF TRIMETHOPRIM TOTAL) BY MOUTH EVERY MONDAY, WEDNESDAY, AND FRIDAY.    tacrolimus (PROGRAF) 0.5 MG capsule TAKE 7 TABLETS BY MOUTH 2 TIMES DAILY    TRESIBA FLEXTOUCH U-100 100 unit/mL (3 mL) InPn INJECT 40 UNITS ONCE A DAY SUBCUTANEOUS    ursodioL (ACTIGALL) 300 mg capsule TAKE 1 CAPSULE BY MOUTH TWO TIMES A DAY.     Current Facility-Administered Medications on File Prior to Visit   Medication    alteplase (ACTIVASE) 2 mg injection small catheter clearance    influenza vaccine quad (FLUARIX, FLULAVAL, FLUZONE) (  6 MOS & UP) 2020-21 ADS Med    loteprednol (LOTEMAX) 0.5 % ophthalmic suspension 1 drop    pneumococcal polysacchride (23-valps) (PNEUMOVAX) 25 mcg/0.5 mL vaccine     Objective:  Vitals:    01/19/23 0958   Pulse: 79   Resp: 18   Temp: 36.6 ??C (97.8 ??F)   TempSrc: Temporal   SpO2: 95%   Weight: 78.7 kg (173 lb 8 oz)       KPS: 80%, Normal activity with effort; some signs or symptoms of disease (ECOG equivalent 1).     Chronic GVHD Assessment     Skin: BSA based features None (0), % BSA involved: No rash (0), non-BSA based features No sclerotic features (0), Other features:  NONE  Mouth: No symptoms (0) Lichen planus-like features present (Diagnostic) no  Eyes: Moderate dry eyes, partially affecting ADL, lubrication drops >3x per day/punctal plugs, WITHOUT new vision impairment (2) Ocular GvHD confirmed by opthalmologist yes  GI Tract: features None No symptoms (0)  Liver: Normal total bili and ALT/AP <3x ULN (0)  Lungs: symptom score Moderate symptoms with SOB after walking on flat ground (2), FEV1 worsened since prior testing   Joints and Fascia: symptom score No symptoms (0) P-ROM shoulder Not done, Elbow Not done, Wrist/Fingers Not done, Ankles Not done      Genital tract: Not examined  Other features due to cGVHD: None    Overall cGVHD Score (current):  Moderate (eyes. lung)    Physical Exam:  General appearance: Middle aged WM, appears well, in no distress  Head: Normocephalic, without obvious abnormality  Eyes: +dryness, +mild erythema noted on surrounding conjunctiva on exam today (similar to prior exams), sclera white, non-incteric  Neck: no palpable adenopathy  Mouth: MMM, no ulcers, no mucoceles or lichenoid changes, several missing teeth/ plaque noted (oral exam improved from last year)  Lungs: Clear to auscultation bilaterally, no wheezing, good air movement and comfortable work of breathing  Heart: Regular rate and rhythm, S1, S2 normal, no murmur, no edema   Abdomen: soft, non-tender, normal BS  Extremities: No edema as above  Skin: No rashes  Neuro: No focal deficits     Lab Results   Component Value Date    WBC 8.8 01/19/2023    HGB 14.9 01/19/2023    HCT 41.9 01/19/2023    PLT 320 01/19/2023    NEUTROABS 4.7 01/19/2023    EOSABS 0.2 01/19/2023     Lab Results   Component Value Date    NA 138 01/19/2023    K 3.7 01/19/2023    CL 98 01/19/2023    CO2 28.0 01/19/2023    BUN 20 01/19/2023    CREATININE 1.42 (H) 01/19/2023    GLU 134 01/19/2023    CALCIUM 9.6 01/19/2023    MG 1.9 11/17/2022     Lab Results   Component Value Date    BILITOT 0.6 01/19/2023    PROT 7.1 01/19/2023    ALBUMIN 3.6 01/19/2023    ALT 82 (H) 01/19/2023    AST 37 (H) 01/19/2023    ALKPHOS 184 (H) 01/19/2023    LDH 175 10/03/2019     Assessment/Plan:  Disease/BMT: Complicated transplant history including auto SCT (07/18/14), CAR-T infusion (CART-T LCCC 1524 trial; 08/04/14), and lastly allogeneic HSCT (at [facility]; 08/29/15) for relapsed HD. He presented back to [facility] to re-establish care with Dr. Oswaldo Done on D+77 s/p MUD HSCT.     ** For complete transplant note and CAR-T infusion, see clinic  note from 11/14/15.     HCT-CI: Unknown   Match: MUD (10/10, CMV+, ABO matched, male)  Conditioning: Pentostatin + 600 cGy TBI (RIC)  Reported Cell Dose: 3.93 x 10^6 CD34+ cells/kg.  Post-SCT BmBx Evaluations:  11/07/15: D+60 Post HSCT BmBx [at [facility]].      ** Per outside report: TLH, no e/o non-Hodgkin B-cell lymphoproliferative disease. No increase in CD34+ CD117+ myeloblasts.  11/27/15: D+90. Normocellular [50%] w/TLH and no e/o classical HL.     02/20/16: PET/CT w/ NED.  08/05/17: PET/CT w/NED. Deauville 1.  - No further surveillance scans - Will monitor based on clinical suspicion in the setting of symptoms      Chimerism:  - Full donor chimerism in blood and bone marrow since allo HSCT.     ** Will continue monitoring with yearly checks - Last checked August 2024.    Heme:  - Engrafted with normal Hgb and platelets, very mild leukocytosis intermittently improved.      GvHD:   Prophylaxis:  - MTX/Tac/. Siro stopped early due to AKI.    Tacrolimus:   - Stopped tacrolimus 11/05/16.    - Re-started 09/03/17 w/worsening liver GvHD [biopsy proven] and ocular cGVHD, now with lung GvHD as well (09/2022).   - Goal level 5-10. CTM    Ocular GvHD: Chronic and dry eyes are painful.      - Followed by Dr. Armando Reichert at [facility] in Ophthalmology who placed dissolvable plugs and prescribed a regimen of artificial tears and po omega-3 FAs, now with Dr. Georgeann Oppenheim  -> symptoms improved in AM after fluorometholone added  -  Uses lubricating eye drops in addition to serum drops and ointment at night.  Also on systemic therapy with Jakafi + Tac.  -  Continuing to follow with Ophthalmology as well       GI/Hepatic:   07/23/17: Started 1mg /kg oral prednisone for transaminitis.   08/18/17: Liver biopsy results:    ** Histologic findings most c/w chronic graft-versus-host disease. Moderate portal/periportal fibrosis with widespread ductopenia involving 13/15 portal tracts on H&E stained section. Moderate to severe perisinusoidal fibrosis, irregularly distributed, but predominantly centrizonal.   08/20/17: Added Jakafi due to persistent dry eye and rising transaminases in light of liver bx results - steroid refractory by definition.      ** AST peaked at 416 [08/26/17]    ** ALT peaked at 369 [08/26/17]     ** Tbili was 1.5; Alk PO4 793   09/03/17: Liver fx improved. Added back tacrolimus.  09/08/17: Taper prednisone dose 35mg  po qd.   09/16/17: LFTs increased from last week. Increase Jakafi to 10 mg bid [having significant SI hyperglycemia]. Tac goal 5-10.   09/30/17: LFTs improved, taper prednisone to 25mg .    11/18/17: Taper pred to 5 mg.   07/19/19: Prednisone now off.  Continuing jakafi + tacrolimus. Tac goal 5-10 as above.  09/12/19: Transaminitis has flucuated, slightly up today.  Continues on Jakafi 10mg  BID + Tac.    10/03/19: Not sure if this worsening hepatic GvHD (as eyes have been stable and other symptoms resolved).   - Saw Dr. Piedad Climes, who has prescribed ursodiol (continuing) and will continue to follow. Continuing jakafi + tacrolimus dosed per levels. Elevated transaminases at low levels thought to be 2/2 FLD at this point - Continuing to monitor.  - Worsening reflux symptoms associated with dysphagia to large pills concerning for potential upper GI involvement of cGvHD - Will refer for luminal GI consultation for further evaluation.  Pulmonary GvHD:  - 10/20/22: New cough since his 09/2022 visit: PFTs with worsening FEV1 c/w pulmonary GvHD. Appreciate assistance from Dr. Dudley Major.  Has seen some symptomatic improvement with Trelegy (and tessalon perls).  I have prescribed Belumosudil 200mg /d today and this was started in November. Continuing monteleukast as well.     - Systemic GvHD therapy at this point is tacrolimus + jakafi + belumosudil (now on month 2 of this). LFTs mildly elevated but not increased from prior to belumosudil. Dr. Dudley Major had wanted to add QVAR as well but could not get insurance approval for this.    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 Hepatic vasculature remains patent with normal flow. Coarsened, echogenic hepatic parenchyma suggestive of chronic liver disease. Gallbladder is unremarkable. No biliary ductal dilatation.     ID:   - Antiviral: Prophy valtrex 500mg  qd  - PJP: Prophy Bactrim DS qMWF    Viral Screening:  01/16/20: CMV/ EBV negative.     Immunizations:   Covid vaccine - 04/12/19 dose #1 and 05/02/19 dose # [Pfizer].      GI: Abd pain - resolved.   - Endoscopy w/several , non-bleeding duodenal ulcers.   -  Pancreatic insufficiency w/foul smelling diarrhea much improved on Creon. Encouraged continued well-balanced intake (inc. Adequate protein) and exercise to try and build muscle mass. Weight stable.  Prn antacids.    FEN/Renal:  Chronic Renal Insufficiency:   - Baseline post-SCT 1.8-2.1, now better stable at 1.2-1.5.     CV:   Hx hyperlipidemia:  - On ruxolitinib, changed lovastatin to higher potency statin Rosuvastatin 20 mg daily.   - Cholesterol panel checked 07/19/19.  - Following for this as well with his PCP & endocrinologist    Hx cardiomyopathy:  - Per Care Everywhere: hx chemotherapy induced cardiomyopathy.   - ECHO at [facility] w/EF 50-55%.    ** PET scan from [facility], found to have age advanced coronary artery atherosclerosis.  - Was on ASA but stopped after GI bleed.      HTN:  - Normotensive on amlodipine 5mg  qd.     Pulm:   - DLCOc: 61.2%. Hx pulmonary fibrosis following ABVD therapy.   - Pulmonary nodules resolved, repeat imaging scheduled.   08/10/17: PET and CT negative.      Seasonal allergic rhinitis:   - Azelastine-fluticasone spray bid and allegra prn.     Neuro/Pain:   - No issues.     Psych:   Hx depression:   - Mood is stable on Zoloft 100mg  qd.      Insomnia:   - Lunesta prn.       Endocrine:   Hx hypothyroidism:   - Monitoring and medication adjustments per endocrinologist, Dr. Talmage Nap.      Hx Uncontrolled DM II:  - Followed by Dr. Horald Pollen in [place] (Endocrinology).   - Worsened by steroids, now off.   - Had been uncontrolled for months, Prior HgbA1C 9.5  - We have tapered prednisone off as above, Being managed by Dr. Talmage Nap. Now on insulin pump + monitoring    Osteopenia:  - seen on Dexa 04/2020, worst T-score 2.1     Musculoskeletal:   DEXA:  11/23/15: Mildly low bone density. Started calcium per protocol. Zometa given 11/23/15 (completed course].   07/08/17: Re-started Ca w/D BID while on steroids.   09/09/17: Repeat DEXA showed osteoporosis with worsened bone density in the  spine, stable in the femur.   04/16/20: DEXA with osteopenia - Would like to give denosumab - was denied.   12/31/20: Ostepenia as above   - Could not get approval for denosumab, however he did receive zometa    Summary: 40yrs post 10/10 MUD allo HSCT for relapsed HL, currently fully donor with no active e/o disease, prior treatment with autoSCT and anti-CD30 CAR-Tc (July 2016). Now has new diagnosis of pulmonary GvHD (no biopsy done; appreciate assistance from Dr. Dudley Major in pulmonology in making the clinical diagnosis and formulating a treatment plan). Ocular cGvHD persists and is stable -> Cont. jakafi + Tacrolimus + belumosudil + autologous serum tears. Also continuing ursodiol as Dr. Piedad Climes has prescribed.  - Consulting luminal GI given worsening reflux symptoms and dysphagia to pills c/f upper GI tract involvement of his cGvHD  - Will continue to follow with [facility] and with Hepatology.  - Agree with continuing current dose of autologous serum tears   - Annual influenza vaccine and up-to-date sars-cov2 vaccination given at his appointment with Dr. Dudley Major.    I personally spent over 60 minutes face-to-face and non-face-to-face in the care of this patient, which includes all pre, intra, and post visit time on the date of service.

## 2023-02-18 DIAGNOSIS — C8190 Hodgkin lymphoma, unspecified, unspecified site: Principal | ICD-10-CM

## 2023-02-18 NOTE — Unmapped (Signed)
[facility] Refill Coordination Note    Specialty Medication(s) to be Shipped:   REZUROCK 200 mg Tab (belumosudil)    Other medication(s) to be shipped: No additional medications requested for fill at this time     Zachary Mathis, DOB: 21-Jun-1965  Phone: [phone] (home)       All above HIPAA information was verified with patient.     Was a Nurse, learning disability used for this call? No    Completed refill call assessment today to schedule patient's medication shipment from the [facility]  [phone]).  All relevant notes have been reviewed.     Specialty medication(s) and dose(s) confirmed: Regimen is correct and unchanged.   Changes to medications: Zachary Mathis reports no changes at this time.  Changes to insurance: No  New side effects reported not previously addressed with a pharmacist or physician: None reported  Questions for the pharmacist: No    Confirmed patient received a Conservation officer, historic buildings and a Surveyor, mining with first shipment. The patient will receive a drug information handout for each medication shipped and additional FDA Medication Guides as required.       DISEASE/MEDICATION-SPECIFIC INFORMATION        N/A    SPECIALTY MEDICATION ADHERENCE     Medication Adherence    Patient reported X missed doses in the last month: 0  Specialty Medication: REZUROCK 200 mg Tab (belumosudil)  Patient is on additional specialty medications: No              Were doses missed due to medication being on hold? No    REZUROCK 200 mg Tab (belumosudil): 5 doses of medicine on hand     REFERRAL TO PHARMACIST     Referral to the pharmacist: Not needed      [facility]     Shipping address confirmed in Epic.       Delivery Scheduled: Yes, Expected medication delivery date: 03/02/2023.     Medication will be delivered via UPS to the prescription address in Epic WAM.    Dorisann Frames   [facility]  Specialty Technician

## 2023-02-20 DIAGNOSIS — C8190 Hodgkin lymphoma, unspecified, unspecified site: Principal | ICD-10-CM

## 2023-02-20 MED ORDER — MONTELUKAST 10 MG TABLET
ORAL_TABLET | Freq: Every day | ORAL | 3 refills | 30.00 days | Status: CP
Start: 2023-02-20 — End: 2024-02-20

## 2023-02-27 MED FILL — REZUROCK 200 MG TABLET: ORAL | 30 days supply | Qty: 30 | Fill #2

## 2023-03-06 DIAGNOSIS — C8190 Hodgkin lymphoma, unspecified, unspecified site: Principal | ICD-10-CM

## 2023-03-12 DIAGNOSIS — D89811 Chronic graft-versus-host disease: Principal | ICD-10-CM

## 2023-03-12 DIAGNOSIS — C8190 Hodgkin lymphoma, unspecified, unspecified site: Principal | ICD-10-CM

## 2023-03-12 DIAGNOSIS — D89813 Graft-versus-host disease, unspecified: Principal | ICD-10-CM

## 2023-03-12 MED ORDER — RUXOLITINIB 10 MG TABLET
ORAL_TABLET | Freq: Two times a day (BID) | ORAL | 6 refills | 30 days | Status: CP
Start: 2023-03-12 — End: ?

## 2023-03-12 MED ORDER — TACROLIMUS 0.5 MG CAPSULE, IMMEDIATE-RELEASE
ORAL_CAPSULE | Freq: Two times a day (BID) | ORAL | 5 refills | 30.00 days | Status: CP
Start: 2023-03-12 — End: ?

## 2023-03-13 DIAGNOSIS — C8190 Hodgkin lymphoma, unspecified, unspecified site: Principal | ICD-10-CM

## 2023-03-13 DIAGNOSIS — D89811 Chronic graft-versus-host disease: Principal | ICD-10-CM

## 2023-03-13 MED ORDER — TACROLIMUS 1 MG CAPSULE, IMMEDIATE-RELEASE
ORAL_CAPSULE | ORAL | 4 refills | 30.00 days | Status: CP
Start: 2023-03-13 — End: 2024-03-12

## 2023-03-13 NOTE — Unmapped (Signed)
 [facility] has received the prescription(s) for tacrolimus 1 MG capsule (PROGRAF). The triage team has completed the benefits investigation and has determined that the patient is NOT able to fill this medication at the [facility] due to insurance plan limitations. Please see additional information below and re-route the prescription to the preferred pharmacy. Thank you.    PA Required: Unable to Determine    Specialty Pharmacy Required:  [facility] - Phone: [phone] and Fax: [phone]    Please note that due to Board of Pharmacy restrictions the authorizing provider must reorder to the pharmacy listed above due to insurance preference

## 2023-03-13 NOTE — Unmapped (Signed)
 The [facility] has received the prescription(s) for JAKAFI 10 mg tablet (ruxolitinib). The triage team has completed the benefits investigation and has determined that the patient is NOT able to fill this medication at the [facility] due to insurance plan limitations. Please see additional information below and re-route the prescription to the preferred pharmacy. Thank you.    PA Required: Unable to Determine    Specialty Pharmacy Required:  [facility] - Phone: [phone] and Fax: [phone]    Please note that due to Board of Pharmacy restrictions the authorizing provider must reorder to the pharmacy listed above due to insurance preference

## 2023-03-17 DIAGNOSIS — D89811 Chronic graft-versus-host disease: Principal | ICD-10-CM

## 2023-03-17 DIAGNOSIS — C8190 Hodgkin lymphoma, unspecified, unspecified site: Principal | ICD-10-CM

## 2023-03-17 DIAGNOSIS — D89813 Graft-versus-host disease, unspecified: Principal | ICD-10-CM

## 2023-03-17 MED ORDER — TACROLIMUS 1 MG CAPSULE, IMMEDIATE-RELEASE
ORAL_CAPSULE | ORAL | 6 refills | 30.00 days | Status: CP
Start: 2023-03-17 — End: 2023-03-17

## 2023-03-17 MED ORDER — RUXOLITINIB 10 MG TABLET
ORAL_TABLET | Freq: Two times a day (BID) | ORAL | 6 refills | 30 days | Status: CP
Start: 2023-03-17 — End: 2023-03-17

## 2023-03-23 DIAGNOSIS — C8190 Hodgkin lymphoma, unspecified, unspecified site: Principal | ICD-10-CM

## 2023-03-23 NOTE — Unmapped (Signed)
 [facility] Other Call ONC Phone Room Smart Lists: General -     Hi,     Brooke with [facility] contacted the Communication Center regarding the following:    - is calling to speak with the care team to discuss a possible drug interaction, reference number: [id number]    Please contact Brooke at [phone].    Thanks in advance,    Rosary Lively  [facility] Communication Center   [phone]    [facility]

## 2023-03-25 DIAGNOSIS — C8190 Hodgkin lymphoma, unspecified, unspecified site: Principal | ICD-10-CM

## 2023-03-25 NOTE — Unmapped (Signed)
 [facility] Other Call ONC Phone Room Smart Lists: General -     Hi,     Megan w/[facility] contacted the Communication Center regarding the following:    - Megan called to discuss interaction with medication-  tacrolimus (PROGRAF) 1 MG capsule     Please contact Megan  at [phone] opt1, then 6.    Thanks in advance,    Cipriano Mile  [facility] Communication Center   [phone]    [facility]

## 2023-03-27 NOTE — Unmapped (Signed)
 Cellular Therapy Research Note  Date: 03-27-2023    The IRB approved letter for [facility] [id number] regarding IRB transfer was sent to the patient today. He is aware to contact me with any questions.    Carlyn Reichert  Clinical Immunotherapy Research Coordinator

## 2023-04-01 DIAGNOSIS — C8190 Hodgkin lymphoma, unspecified, unspecified site: Principal | ICD-10-CM

## 2023-04-01 NOTE — Unmapped (Signed)
 [facility] Refill Coordination Note    Zachary Mathis, DOB: [birth year]  Phone: [phone] (home)       All above HIPAA information was verified with patient.         04/01/2023    12:58 PM   Specialty Rx Medication Refill Questionnaire   Which Medications would you like refilled and shipped? Rezarock 200mg    Please list all current allergies: None   Have you missed any doses in the last 30 days? No   Have you had any changes to your medication(s) since your last refill? No   How many days remaining of each medication do you have at home? 5   Have you experienced any side effects in the last 30 days? No   Please enter the full address (street address, city, state, zip code) where you would like your medication(s) to be delivered to. [address] [place], [place] [address]   Please specify on which day you would like your medication(s) to arrive. Note: if you need your medication(s) within 3 days, please call the pharmacy to schedule your order at [phone]  04/06/2023   Has your insurance changed since your last refill? No   Would you like a pharmacist to call you to discuss your medication(s)? No   Do you require a signature for your package? (Note: if we are billing Medicare Part B or your order contains a controlled substance, we will require a signature) No   I have been provided my out of pocket cost for my medication and approve the pharmacy to charge the amount to my credit card on file. Yes         Completed refill call assessment today to schedule patient's medication shipment from the [facility] [phone]).  All relevant notes have been reviewed.       Confirmed patient received a Conservation officer, historic buildings and a Surveyor, mining with first shipment. The patient will receive a drug information handout for each medication shipped and additional FDA Medication Guides as required.         REFERRAL TO PHARMACIST     Referral to the pharmacist: Not needed      [facility]     Shipping address confirmed in Epic.     Delivery Scheduled: Yes, Expected medication delivery date: 04/06/23.     Medication will be delivered via UPS to the prescription address in Epic WAM.    [name]   [facility] Specialty Technician

## 2023-04-03 MED FILL — REZUROCK 200 MG TABLET: ORAL | 30 days supply | Qty: 30 | Fill #3

## 2023-04-07 ENCOUNTER — Ambulatory Visit: Admit: 2023-04-07 | Discharge: 2023-04-08 | Payer: PRIVATE HEALTH INSURANCE

## 2023-04-07 DIAGNOSIS — D89811 Chronic graft-versus-host disease: Principal | ICD-10-CM

## 2023-04-07 MED ORDER — TYRVAYA 0.03 MG/SPRAY NASAL SPRAY
Freq: Two times a day (BID) | NASAL | 5 refills | 0 days | Status: CP
Start: 2023-04-07 — End: ?

## 2023-04-07 NOTE — Unmapped (Signed)
 [facility]    Subjective   Patient ID: Zachary Mathis is a 58 y.o. male.    Chief Complaint    dry eyes       HPI    Patient with history of dry eyes presents for exam. History of cataract extraction with pcIOL both eyes. States eyes are still dry, irritated. Affecting both eyes equally. No complaints of pain, irritation. No flashes or floaters.   Last edited by Sheryn Bison on 04/07/2023  8:58 AM.        No current outpatient medications on file. (ANTI-ULCER PREPARATIONS)       Current Outpatient Medications (Other)   Medication Sig Dispense Refill    acyclovir (ZOVIRAX) 400 MG tablet Take 1 tablet (400 mg total) by mouth two (2) times a day. 180 tablet 3    albuterol HFA 90 mcg/actuation inhaler Inhale 2 puffs every six (6) hours as needed for wheezing. 8 g 1    amLODIPine (NORVASC) 5 MG tablet Take 1 tablet (5 mg total) by mouth daily. 90 tablet 3    azelastine-fluticasone 137-50 mcg/spray Spry 1 spray into each nostril Two (2) times a day.      azithromycin (ZITHROMAX) 250 MG tablet Take 1 tablet (250 mg total) by mouth daily. Take one 250mg  tablet on Mondays, Wednesdays, and Fridays (as part of FAM therapy) 90 tablet 3    belumosudil 200 mg Tab Take 1 tablet (200 mg) by mouth daily. 30 tablet 5    benzonatate (TESSALON PERLES) 100 MG capsule Take 1 capsule (100 mg total) by mouth Three (3) times a day as needed for cough. 270 capsule 1    budesonide (PULMICORT) 180 mcg/actuation inhaler Inhale 1 puff two (2) times a day. 1 each 5    calcium carbonate (TUMS) 200 mg calcium (500 mg) chewable tablet Chew 1 tablet (500 mg total) daily.      calcium carbonate-vitamin D3 600 mg (1,500 mg)-800 unit Chew Chew 1 tablet Two (2) times a day. 100 tablet 0    carboxymethylcellulose sodium (REFRESH CELLUVISC) 1 % DpGe 1 drop every four (4) hours as needed.      carboxymethylcellulose sodium (THERATEARS OPHT) Apply 1 drop to eye every hour.      esomeprazole (NEXIUM) 40 MG capsule Take 1 capsule (40 mg total) by mouth daily before breakfast. 90 capsule 3    eszopiclone (LUNESTA) 2 MG Tab TAKE 1 TABLET BY MOUTH IMMEDIATELY BEFORE BEDTIME 15 tablet 0    fluticasone-umeclidin-vilanter (TRELEGY ELLIPTA) 200-62.5-25 mcg DsDv Inhale 1 puff daily. 28 each 11    FREESTYLE LIBRE 14 DAY SENSOR kit USE AS DIRECTED EVERY 14 DAYS  4    hypromellose (SYSTANE GEL OPHT) Apply to eye.      levothyroxine (SYNTHROID, LEVOTHROID) 300 MCG tablet Take 1 tablet (300 mcg total) by mouth daily.  1    metFORMIN (GLUCOPHAGE) 500 MG tablet TAKE 1 TABLET BY MOUTH TWICE A DAY WITH MEALS 180 tablet 3    metFORMIN (GLUCOPHAGE) 500 MG tablet Take 1 tablet (500 mg total) by mouth in the morning and 1 tablet (500 mg total) in the evening. Take with meals. 180 tablet 3    montelukast (SINGULAIR) 10 mg tablet Take 1 tablet (10 mg total) by mouth daily. 30 tablet 3    multivitamin therapeutic with minerals (THERA-M) 27-0.4 mg Tab Take 1 tablet by mouth daily.      NON FORMULARY Autologous serum tears , 100% concentration QID for 3 months with 3  refills 15 each 0    NOVOLOG FLEXPEN U-100 INSULIN 100 unit/mL (3 mL) injection pen INJECT 3 TO 8 UNITS 3 TIMES A DAY      ocular lubricant (RESTORE PM) 57.3-42.5 % Oint 1 Application Three (3) times a day.      omega-3 acid ethyl esters (LOVAZA) 1 gram capsule Take 2 capsules (2 g total) by mouth Two (2) times a day. 120 capsule 5    pancrelipase, Lip-Prot-Amyl, (CREON) 24,000-76,000 -120,000 unit CpDR delayed release capsule Take 3 capsules by mouth 3 times a day with meals and 2 capsules 2 times a day with snacks 360 capsule 5    ruxolitinib (JAKAFI) 10 mg tablet Take 1 tablet (10 mg total) by mouth two (2) times a day. 60 tablet 6    sertraline (ZOLOFT) 100 MG tablet Take 1 tablet (100 mg total) by mouth daily. 90 tablet 3    sulfamethoxazole-trimethoprim (BACTRIM DS) 800-160 mg per tablet TAKE 1 TABLET (160 MG OF TRIMETHOPRIM TOTAL) BY MOUTH EVERY MONDAY, WEDNESDAY, AND FRIDAY. 12 tablet 5    tacrolimus (PROGRAF) 1 MG capsule Take 3 capsules (3 mg total) by mouth daily AND 4 capsules (4 mg total) nightly. 210 capsule 6    TRESIBA FLEXTOUCH U-100 100 unit/mL (3 mL) InPn INJECT 40 UNITS ONCE A DAY SUBCUTANEOUS      ursodioL (ACTIGALL) 300 mg capsule TAKE 1 CAPSULE BY MOUTH TWO TIMES A DAY. 180 capsule 1    varenicline tartrate (TYRVAYA) 0.03 mg/spray sprm 0.03 mg into each nostril two (2) times a day. 10 mL 5       Objective   Base Eye Exam       Visual Acuity (Snellen - Linear)         Right Left    Dist [place] 20/20 -2 20/40    Dist ph [place] NI NI              Tonometry (Icare, 8:57 AM)         Right Left    Pressure 10 12              Pupils         APD    Right None    Left None              Visual Fields         Left Right     Full Full              Extraocular Movement         Right Left      et     0 0 0   0  0   0 0 0    -- -- --   --  --   -- -- --                 Neuro/Psych       Oriented x3: Yes    Mood/Affect: Normal                  Slit Lamp and Fundus Exam       External Exam         Right Left    External Normal Normal              Slit Lamp Exam         Right Left    Lids/Lashes Blepharitis Blepharitis    Conjunctiva/Sclera White and quiet White and quiet  Cornea clear, 2+ PEE, rTBUT clear, 2+ PEE, rTBUT    Anterior Chamber Deep and quiet Deep and quiet    [facility] Pharm dilated Pharm dilated    Lens Posterior chamber intraocular lens. No PCO Posterior chamber intraocular lens. No PCO    Anterior Vitreous Normal Normal                        No orders of the defined types were placed in this encounter.      Assessment/Plan:   Assessment:        58 y.o. male with past medical history of hypothyroidism, DM II, Hodgkin lymphoma s/p BMT, and depression.    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 Georgeann Oppenheim  #s/p CE/IOL left eye 02/25/2022 with Dr. Georgeann Oppenheim    plan:  - Serum tears 4x/day 100%  - PFATs 4-6 times a day  - Refresh PM at bedtime (intermittently)    - fish oil (omega 3) supplements 4g/day  - Lotemax 1-2 x day    -Tyrvaya        I discussed the above assessment and plan with the patient. He had the opportunity to ask questions, and his questions and concerns were addressed. He was reminded to call if there is any significant change or worsening in vision, or to get an evaluation, urgently if appropriate.    Ria Bush MD,FICO

## 2023-04-17 DIAGNOSIS — D89813 Graft-versus-host disease, unspecified: Principal | ICD-10-CM

## 2023-04-17 NOTE — Unmapped (Signed)
PA submitted. Approved

## 2023-04-20 ENCOUNTER — Other Ambulatory Visit: Admit: 2023-04-20 | Discharge: 2023-04-20

## 2023-04-20 ENCOUNTER — Ambulatory Visit: Admit: 2023-04-20 | Discharge: 2023-04-20 | Attending: Hematology & Oncology | Primary: Hematology & Oncology

## 2023-04-20 DIAGNOSIS — H16223 Keratoconjunctivitis sicca, not specified as Sjogren's, bilateral: Principal | ICD-10-CM

## 2023-04-20 DIAGNOSIS — Z9484 Stem cells transplant status: Principal | ICD-10-CM

## 2023-04-20 DIAGNOSIS — K219 Gastro-esophageal reflux disease without esophagitis: Principal | ICD-10-CM

## 2023-04-20 DIAGNOSIS — R1310 Dysphagia, unspecified: Principal | ICD-10-CM

## 2023-04-20 DIAGNOSIS — D849 Immunodeficiency, unspecified: Principal | ICD-10-CM

## 2023-04-20 DIAGNOSIS — K8689 Other specified diseases of pancreas: Principal | ICD-10-CM

## 2023-04-20 DIAGNOSIS — Z9481 Bone marrow transplant status: Principal | ICD-10-CM

## 2023-04-20 DIAGNOSIS — C8190 Hodgkin lymphoma, unspecified, unspecified site: Principal | ICD-10-CM

## 2023-04-20 DIAGNOSIS — D89811 Chronic graft-versus-host disease: Principal | ICD-10-CM

## 2023-04-20 DIAGNOSIS — C819A Hodgkin's disease in remission, unspecified Hodgkin lymphoma type: Principal | ICD-10-CM

## 2023-04-20 DIAGNOSIS — D89813 Graft-versus-host disease, unspecified: Principal | ICD-10-CM

## 2023-04-20 DIAGNOSIS — E039 Hypothyroidism, unspecified: Principal | ICD-10-CM

## 2023-04-20 LAB — COMPREHENSIVE METABOLIC PANEL
ALBUMIN: 3.9 g/dL (ref 3.4–5.0)
ALKALINE PHOSPHATASE: 195 U/L — ABNORMAL HIGH (ref 46–116)
ALT (SGPT): 59 U/L — ABNORMAL HIGH (ref 10–49)
ANION GAP: 9 mmol/L (ref 5–14)
AST (SGOT): 50 U/L — ABNORMAL HIGH (ref <=34)
BILIRUBIN TOTAL: 0.5 mg/dL (ref 0.3–1.2)
BLOOD UREA NITROGEN: 21 mg/dL (ref 9–23)
BUN / CREAT RATIO: 13
CALCIUM: 9.9 mg/dL (ref 8.7–10.4)
CHLORIDE: 102 mmol/L (ref 98–107)
CO2: 31 mmol/L (ref 20.0–31.0)
CREATININE: 1.6 mg/dL — ABNORMAL HIGH (ref 0.73–1.18)
EGFR CKD-EPI (2021) MALE: 50 mL/min/1.73m2 — ABNORMAL LOW (ref >=60)
GLUCOSE RANDOM: 146 mg/dL (ref 70–179)
POTASSIUM: 4 mmol/L (ref 3.4–4.8)
PROTEIN TOTAL: 7.5 g/dL (ref 5.7–8.2)
SODIUM: 142 mmol/L (ref 135–145)

## 2023-04-20 LAB — CBC W/ AUTO DIFF
BASOPHILS ABSOLUTE COUNT: 0.1 10*9/L (ref 0.0–0.1)
BASOPHILS RELATIVE PERCENT: 0.7 %
EOSINOPHILS ABSOLUTE COUNT: 0.2 10*9/L (ref 0.0–0.5)
EOSINOPHILS RELATIVE PERCENT: 1.8 %
HEMATOCRIT: 46.1 % (ref 39.0–48.0)
HEMOGLOBIN: 16.3 g/dL (ref 12.9–16.5)
LYMPHOCYTES ABSOLUTE COUNT: 1.8 10*9/L (ref 1.1–3.6)
LYMPHOCYTES RELATIVE PERCENT: 19 %
MEAN CORPUSCULAR HEMOGLOBIN CONC: 35.3 g/dL (ref 32.0–36.0)
MEAN CORPUSCULAR HEMOGLOBIN: 36.1 pg — ABNORMAL HIGH (ref 25.9–32.4)
MEAN CORPUSCULAR VOLUME: 102.3 fL — ABNORMAL HIGH (ref 77.6–95.7)
MEAN PLATELET VOLUME: 7.6 fL (ref 6.8–10.7)
MONOCYTES ABSOLUTE COUNT: 0.8 10*9/L (ref 0.3–0.8)
MONOCYTES RELATIVE PERCENT: 8.6 %
NEUTROPHILS ABSOLUTE COUNT: 6.6 10*9/L (ref 1.8–7.8)
NEUTROPHILS RELATIVE PERCENT: 69.9 %
PLATELET COUNT: 339 10*9/L (ref 150–450)
RED BLOOD CELL COUNT: 4.5 10*12/L (ref 4.26–5.60)
RED CELL DISTRIBUTION WIDTH: 15.3 % — ABNORMAL HIGH (ref 12.2–15.2)
WBC ADJUSTED: 9.4 10*9/L (ref 3.6–11.2)

## 2023-04-20 LAB — TACROLIMUS LEVEL, TROUGH: TACROLIMUS, TROUGH: 5 ng/mL (ref 5.0–15.0)

## 2023-04-20 LAB — BILIRUBIN, DIRECT: BILIRUBIN DIRECT: 0.2 mg/dL (ref 0.00–0.30)

## 2023-04-20 LAB — MAGNESIUM: MAGNESIUM: 2.1 mg/dL (ref 1.6–2.6)

## 2023-04-20 NOTE — Unmapped (Signed)
 [facility] Cellular Therapy Program  Immunosuppressive Therapy Note    Zachary Mathis is a 58 y.o. 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 Zachary Mathis is currently day +3198 (BMT Unknown Phase - Planned 07/18/2014), 2791 (BMT Unknown Phase - Planned 08/29/2015).    Current dose:  - In response to most recent level on 01/19/23, 1/06 PM dose was held x1, then resumed prior dose of tacrolimus 3.5 mg BID (which was last increased on 12/12/19); see note from 1/06 for details  - BMT office was notified on 03/13/23 that per insurance limits, prescription needs to be changed from 0.5 mg capsules to 1 mg capsules; updated Rx was sent to [facility] (required per patient by his new insurance) for instructions to take tacrolimus 3 mg qAM + 4 mg qPM (using 1 mg capsules)    Goal tacrolimus Level: 5-10 ng/mL    Resulted level: 5 ng/mL drawn at 08:46 AM on 04/20/23    Lab Results   Component Value Date/Time    TACROLIMUS 5.0 04/20/2023 08:46 AM    TACROLIMUS 15.4 (H) 01/19/2023 09:17 AM    TACROLIMUS 5.6 10/20/2022 08:45 AM      Lab Results   Component Value Date/Time    CREATININE 1.60 (H) 04/20/2023 08:46 AM    CREATININE 1.42 (H) 01/19/2023 09:17 AM    CREATININE 1.41 (H) 11/17/2022 07:56 AM     Recommendation:   Tacrolimus level is within lower end of his therapeutic range  Scr increased to 1.60 from 1.42  Tbili WNL, LFTs remain elevated  Given above, continue tacrolimus 3 mg qAM + 4 mg qPM  Tried to call pt multiple times with no answer to confirm dosing regimen  Sent pt a myChart message to confirm he received supply of 1 mg capsules and is taking as prescribed above  Will recheck level and CMP at next visit    We will continue to monitor levels. Patient will be followed for changes in renal and hepatic function, toxicity, and efficacy.     Marge Shed, PharmD  PGY2 Oncology Pharmacy Resident     Attestation:  I have reviewed and agree with the resident's plan.    Patsie Booty, PharmD, CPP  [facility] Clinical Pharmacist Practitioner

## 2023-04-20 NOTE — Unmapped (Signed)
 BMTCT Routine Follow-up Clinic Note:    Patient Name: Zachary Mathis  Medical Record Number:  [id number]  Encounter Date: 04/20/2023    BMT Attending MD: Rush Coupe, MD    Allogeneic SCT: 08/29/15 [10/10, CMV+, ABO matched, male]  CAR T cell infusion [[id number]]: 08/04/14  Autologous SCT: 07/18/14    HPI:   Zachary Mathis is a 58 y.o. male with a diagnosis of Hodgkins Lymphoma.  Zachary Mathis now presents for routine follow-up post allogeneic stem cell transplant. His course has been complicated by chronic GvHD involving the mucous membranes, eyes, and now lung.     He was last hospitalized after transfer from OSH [7/7 - 07/23/17] with DKA and an acute drop in hgb which were thought to be r/t GI bleeding. During his admission, he had colonoscopy and EGD which did not reveal an actively bleeding sites but revealed multiple non bleeding duodenal ulcers. His pathology was not c/w GHVD or viral cytopathic effect.        He has been seen Dr. Shelley Dew and Dr. Langston Pippins for persistent diarrhea, where workup revealed a low stool elastase and he was diagnosed with pancreatic insufficiency. This has now resolved with pancreatic enzyme supplementation. His still has loose stools intermittently for which he takes Imodium prn. He has also seen Dr. Almetta Jacquet with Hepatology who has prescribed ursodiol and will continue to follow. He is now followed in the BMT clinic to monitor his cGVHD [eyes have been active for some time, now also with pulmonary involvement], immune suppression levels, viral and immune monitoring, and chimerism monitoring.     For DM, he is continuing therapy with an insulin pump + monitoring. Followed by his endocrinologist for this.       Interval History:   Since his last visit, Zachary Mathis cough and dyspnea are stably improved. Appreciate the assistance of Dr. Pierce Brewster in helping to optimize his medical management of pulmonary cGvHD.  Lung exam is showing improvement in air movement as well (with no wheezing, rhonchi, or chest tightness).  He continues to work full time although this is becoming more stressful. Ocular cGvHD symtoms persist and are not substantially improved over prior with the use of autologous serum tears - However his symptoms also do not seem to be worsening. He is able to work but occasionally limited by having to rest his eyes for a time to recover, also able to drive mostly but occasionally unable to drive as well, somewhat unchanged. Has to wear goggles when driving a convertible due to ocular sun sensitivity. He is using his serum drops that help temporarily but then his dryness returns after a few hours - Plans to continue these. His ophthalmologist is continuing to follow him for cataract care as well - He had right eye cataract removal surgery in December 2023, after which he recovered well and is doing well with vision 20/25 in his right eye.      He is now having considerably increased reflux symptoms, worse with lying (and requiring him to sleep with pillows for support), without a change in diet (including no increase in spicy foods or alcohol). Also has developed some persistent dysphagia, sometimes bad with pills and requiring him to cut his meat into small pieces. No oral, mucosal or skin symptoms, and no .    He is now on tacrolimus and we are continuing to follow levels - currently at 3.5mg  po bid, which has been a stable dose for him for over a year.  Appetite  remains good and he is more active (unchanged from most recent visits).  No change to stooling.  No nausea.  His endocrinologist has switched him to a continuous insulin pump which he is tolerating well and is controlling his blood glucose levels better.     EBV was detectable in the peripheral blood (< 100) on 09/12/19 -> ND since.      ROS:   PMH and PSH have been reviewed and there are no changes.      SocHx:  There are no pertinent changes to [facility].  The patient is residing at home and he continues to work. Has also been playing music with a group for recreation.     Hematology/Oncology History   Hodgkin lymphoma, mixed cellularity   12/13/2012 Initial Diagnosis    Hodgkin lymphoma, mixed cellularity (RAF-HCC).  Stage at initial diagnosis: IIIB (bone marrow negative, ESR > 50, LDH 297, alb < 4.0, age 70)     12/13/2012 - 08/12/2013 Chemotherapy    6 cycles of ABVD. 1. Small sub-cm lung nodules on pre-treatment CT, not thought to represent lymphoma; 2. Pulmonary fibrosis with ABVD     12/31/2012 Biopsy    Lymph node, left supraclavicular, excision - Classical Hodgkin lymphoma, mixed cellularity subtype     01/25/2013 Biopsy    Normocellular bone marrow (60%) with trilineage hematopoiesis; negative for lymphoma     12/13/2013 -  Other    Patient discovered a palpable L supraclavicular node     01/26/2014 -  Cancer Staged    Deauville 5: Avid lymphoma in cervical, left axillary, mediastinal, left hilar, and periaortic nodes.      01/26/2014 Progression    PET/CT interpreted as lymphoma progression in the neck, chest, and abdomen/pelvis (along with hepatic steatosis and coronary artery calcifications).       02/13/2014 - 05/15/2014 Chemotherapy    Started ICE therapy in February with last cycle in May 2016. Received 4 cycles.      06/05/2014 Biopsy    Mildly hypercellular bone marrow (60%) with trilineage hematopoiesis.  No morphologic evidence of classical Hodgkin lymphoma     06/06/2014 -  Cancer Staged    PET/CT: Response to therapy of hypermetabolic adenopathy since 04/07/2016CT and 01/26/2014 PET. No residual malignant range activityidentified.2. No new sites of disease.3. Hepatic steatosis.4. Age advanced coronary artery atherosclerosis.      06/22/2014 - 06/23/2014 Chemotherapy    Stem cell mobilization with etoposide.     07/03/2014 -  Other    Stem cell collection.  CD34+DOSECOLLECT: 6.80      07/07/2014 -  Cancer Staged    CT scan: IMPRESSION: 1. Slightly decreased size of axillary, mediastinal, and retroperitoneal lymphadenopathy. 2. Hepatic steatosis.      07/18/2014 Bone Marrow Transplant    Autologus stem cell transplant     08/04/2014 Bone Marrow Transplant    CART infusion     01/30/2015 Progression    Mediastinal Relapse     02/21/2015 - 04/12/2015 Chemotherapy    3 cycles of bendamustine + brentuximab       Allergies:  Patient has no known allergies.     Medications:    Current Outpatient Medications on File Prior to Visit   Medication Sig    acyclovir (ZOVIRAX) 400 MG tablet Take 1 tablet (400 mg total) by mouth two (2) times a day.    albuterol HFA 90 mcg/actuation inhaler Inhale 2 puffs every six (6) hours as needed for wheezing.    amLODIPine (NORVASC) 5  MG tablet Take 1 tablet (5 mg total) by mouth daily.    azelastine-fluticasone 137-50 mcg/spray Spry 1 spray into each nostril Two (2) times a day.    azithromycin (ZITHROMAX) 250 MG tablet Take 1 tablet (250 mg total) by mouth daily. Take one 250mg  tablet on Mondays, Wednesdays, and Fridays (as part of FAM therapy)    belumosudil 200 mg Tab Take 1 tablet (200 mg) by mouth daily.    benzonatate (TESSALON PERLES) 100 MG capsule Take 1 capsule (100 mg total) by mouth Three (3) times a day as needed for cough.    budesonide (PULMICORT) 180 mcg/actuation inhaler Inhale 1 Mathis two (2) times a day.    calcium carbonate (TUMS) 200 mg calcium (500 mg) chewable tablet Chew 1 tablet (500 mg total) daily.    calcium carbonate-vitamin D3 600 mg (1,500 mg)-800 unit Chew Chew 1 tablet Two (2) times a day.    carboxymethylcellulose sodium (REFRESH CELLUVISC) 1 % DpGe 1 drop every four (4) hours as needed.    carboxymethylcellulose sodium (THERATEARS OPHT) Apply 1 drop to eye every hour.    esomeprazole (NEXIUM) 40 MG capsule Take 1 capsule (40 mg total) by mouth daily before breakfast.    eszopiclone (LUNESTA) 2 MG Tab TAKE 1 TABLET BY MOUTH IMMEDIATELY BEFORE BEDTIME    fluticasone-umeclidin-vilanter (TRELEGY ELLIPTA) 200-62.5-25 mcg DsDv Inhale 1 Mathis daily.    FREESTYLE LIBRE 14 DAY SENSOR kit USE AS DIRECTED EVERY 14 DAYS    hypromellose (SYSTANE GEL OPHT) Apply to eye.    levothyroxine (SYNTHROID, LEVOTHROID) 300 MCG tablet Take 1 tablet (300 mcg total) by mouth daily.    metFORMIN (GLUCOPHAGE) 500 MG tablet TAKE 1 TABLET BY MOUTH TWICE A DAY WITH MEALS    metFORMIN (GLUCOPHAGE) 500 MG tablet Take 1 tablet (500 mg total) by mouth in the morning and 1 tablet (500 mg total) in the evening. Take with meals.    montelukast (SINGULAIR) 10 mg tablet Take 1 tablet (10 mg total) by mouth daily.    multivitamin therapeutic with minerals (THERA-M) 27-0.4 mg Tab Take 1 tablet by mouth daily.    NON FORMULARY Autologous serum tears , 100% concentration QID for 3 months with 3 refills    NOVOLOG FLEXPEN U-100 INSULIN 100 unit/mL (3 mL) injection pen INJECT 3 TO 8 UNITS 3 TIMES A DAY    ocular lubricant (RESTORE PM) 57.3-42.5 % Oint 1 Application Three (3) times a day.    omega-3 acid ethyl esters (LOVAZA) 1 gram capsule Take 2 capsules (2 g total) by mouth Two (2) times a day.    pancrelipase, Lip-Prot-Amyl, (CREON) 24,000-76,000 -120,000 unit CpDR delayed release capsule Take 3 capsules by mouth 3 times a day with meals and 2 capsules 2 times a day with snacks    ruxolitinib (JAKAFI) 10 mg tablet Take 1 tablet (10 mg total) by mouth two (2) times a day.    sertraline (ZOLOFT) 100 MG tablet Take 1 tablet (100 mg total) by mouth daily.    sulfamethoxazole-trimethoprim (BACTRIM DS) 800-160 mg per tablet TAKE 1 TABLET (160 MG OF TRIMETHOPRIM TOTAL) BY MOUTH EVERY MONDAY, WEDNESDAY, AND FRIDAY.    tacrolimus (PROGRAF) 1 MG capsule Take 3 capsules (3 mg total) by mouth daily AND 4 capsules (4 mg total) nightly.    TRESIBA FLEXTOUCH U-100 100 unit/mL (3 mL) InPn INJECT 40 UNITS ONCE A DAY SUBCUTANEOUS    ursodioL (ACTIGALL) 300 mg capsule TAKE 1 CAPSULE BY MOUTH TWO TIMES A DAY.  varenicline tartrate (TYRVAYA) 0.03 mg/spray sprm 0.03 mg into each nostril two (2) times a day.     Current Facility-Administered Medications on File Prior to Visit   Medication    alteplase (ACTIVASE) 2 mg injection small catheter clearance    influenza vaccine quad (FLUARIX, FLULAVAL, FLUZONE) (6 MOS & UP) 2020-21 ADS Med    loteprednol (LOTEMAX) 0.5 % ophthalmic suspension 1 drop    pneumococcal polysacchride (23-valps) (PNEUMOVAX) 25 mcg/0.5 mL vaccine     Objective:  There were no vitals filed for this visit.      KPS: 80%, Normal activity with effort; some signs or symptoms of disease (ECOG equivalent 1).     Chronic GVHD Assessment     Skin: BSA based features None (0), % BSA involved: No rash (0), non-BSA based features No sclerotic features (0), Other features:  NONE  Mouth: No symptoms (0) Lichen planus-like features present (Diagnostic) no  Eyes: Moderate dry eyes, partially affecting ADL, lubrication drops >3x per day/punctal plugs, WITHOUT new vision impairment (2) Ocular GvHD confirmed by opthalmologist yes  GI Tract: features None No symptoms (0)  Liver: Normal total bili and ALT/AP <3x ULN (0)  Lungs: symptom score Moderate symptoms with SOB after walking on flat ground (2), last FEV1 worsened since prior testing   Joints and Fascia: symptom score No symptoms (0) P-ROM shoulder 7, Elbow 7, Wrist/Fingers 7, Ankles 4      Genital tract: Not examined  Other features due to cGVHD: None    Overall cGVHD Score (current):  Moderate (eyes. lung)    Physical Exam:  General appearance: Middle aged WM, appears well, in no distress  Head: Normocephalic, without obvious abnormality  Eyes: +dryness, +mild erythema noted on surrounding conjunctiva on exam today (similar to prior exams), sclera white, non-incteric  Neck: no palpable adenopathy  Mouth: MMM, no ulcers, no mucoceles or lichenoid changes, several missing teeth/ plaque noted (oral exam improved from last year)  Lungs: Clear to auscultation bilaterally, no wheezing, good air movement and comfortable work of breathing  Heart: Regular rate and rhythm, S1, S2 normal, no murmur, no edema   Abdomen: soft, non-tender, normal BS  Extremities: No edema as above  Skin: No rashes  Neuro: No focal deficits     Lab Results   Component Value Date    WBC 9.4 04/20/2023    HGB 16.3 04/20/2023    HCT 46.1 04/20/2023    PLT 339 04/20/2023    NEUTROABS 6.6 04/20/2023    EOSABS 0.2 04/20/2023     Lab Results   Component Value Date    NA 138 01/19/2023    K 3.7 01/19/2023    CL 98 01/19/2023    CO2 28.0 01/19/2023    BUN 20 01/19/2023    CREATININE 1.42 (H) 01/19/2023    GLU 134 01/19/2023    CALCIUM 9.6 01/19/2023    MG 1.9 11/17/2022     Lab Results   Component Value Date    BILITOT 0.6 01/19/2023    PROT 7.1 01/19/2023    ALBUMIN 3.6 01/19/2023    ALT 82 (H) 01/19/2023    AST 37 (H) 01/19/2023    ALKPHOS 184 (H) 01/19/2023    LDH 175 10/03/2019     Assessment/Plan:  Disease/BMT: Complicated transplant history including auto SCT (07/18/14), CAR-T infusion (CART-T LCCC 1524 trial; 08/04/14), and lastly allogeneic HSCT (at [facility]; 08/29/15) for relapsed HD. He presented back to [facility] to re-establish care with Dr. Gayl Katos on D+77  s/p MUD HSCT.     ** For complete transplant note and CAR-T infusion, see clinic note from 11/14/15.     HCT-CI: Unknown   Match: MUD (10/10, CMV+, ABO matched, male)  Conditioning: Pentostatin + 600 cGy TBI (RIC)  Reported Cell Dose: 3.93 x 10^6 CD34+ cells/kg.  Post-SCT BmBx Evaluations:  11/07/15: D+60 Post HSCT BmBx [at [facility]].      ** Per outside report: TLH, no e/o non-Hodgkin B-cell lymphoproliferative disease. No increase in CD34+ CD117+ myeloblasts.  11/27/15: D+90. Normocellular [50%] w/TLH and no e/o classical HL.     02/20/16: PET/CT w/ NED.  08/05/17: PET/CT w/NED. Deauville 1.  - No further surveillance scans - Will monitor based on clinical suspicion in the setting of symptoms      Chimerism:  - Full donor chimerism in blood and bone marrow since allo HSCT.     ** Will continue monitoring with yearly checks - Last checked August 2024.    Heme:  - Engrafted with normal Hgb and platelets, very mild leukocytosis intermittently improved.      GvHD:   Prophylaxis:  - MTX/Tac/. Siro stopped early due to AKI.    Tacrolimus:   - Stopped tacrolimus 11/05/16.    - Re-started 09/03/17 w/worsening liver GvHD [biopsy proven] and ocular cGVHD, now with lung GvHD as well (09/2022).   - Goal level 5-10. CTM    Ocular GvHD: Chronic and dry eyes are painful.      - Followed by Dr. Conley Deer at [facility] in Ophthalmology who placed dissolvable plugs and prescribed a regimen of artificial tears and po omega-3 FAs, now with Dr. Dirk Fredericks  -> symptoms improved in AM after fluorometholone added  -  Uses lubricating eye drops in addition to serum drops and ointment at night.  Also on systemic therapy with Jakafi + Belumosudil  + Tac.  -  Continuing to follow with Ophthalmology as well       GI/Hepatic:   07/23/17: Started 1mg /kg oral prednisone for transaminitis.   08/18/17: Liver biopsy results:    ** Histologic findings most c/w chronic graft-versus-host disease. Moderate portal/periportal fibrosis with widespread ductopenia involving 13/15 portal tracts on H&E stained section. Moderate to severe perisinusoidal fibrosis, irregularly distributed, but predominantly centrizonal.   08/20/17: Added Jakafi due to persistent dry eye and rising transaminases in light of liver bx results - steroid refractory by definition.      ** AST peaked at 416 [08/26/17]    ** ALT peaked at 369 [08/26/17]     ** Tbili was 1.5; Alk PO4 793   09/03/17: Liver fx improved. Added back tacrolimus.  09/08/17: Taper prednisone dose 35mg  po qd.   09/16/17: LFTs increased from last week. Increase Jakafi to 10 mg bid [having significant SI hyperglycemia]. Tac goal 5-10.   09/30/17: LFTs improved, taper prednisone to 25mg .    11/18/17: Taper pred to 5 mg.   07/19/19: Prednisone now off.  Continuing jakafi + tacrolimus. Tac goal 5-10 as above.  09/12/19: Transaminitis has flucuated, slightly up today.  Continues on Jakafi 10mg  BID + Tac.    10/03/19: Not sure if this worsening hepatic GvHD (as eyes have been stable and other symptoms resolved).   - Saw Dr. Almetta Jacquet, who has prescribed ursodiol (continuing) and will continue to follow. Continuing belumosudil + jakafi + tacrolimus dosed per levels. Elevated transaminases at low levels thought to be 2/2 FLD at this point - Continuing to monitor.  - Worsening reflux symptoms associated with dysphagia  to large pills concerning for potential upper GI involvement of cGvHD - Will refer for luminal GI consultation for further evaluation.    Pulmonary GvHD:  - 10/20/22: New cough since his 09/2022 visit: PFTs with worsening FEV1 c/w pulmonary GvHD. Appreciate assistance from Dr. Pierce Brewster.  Has seen some symptomatic improvement with Trelegy (and tessalon perls).  I have prescribed Belumosudil 200mg /d and this was started in November 2024. Continuing monteleukast as well.   - Systemic GvHD therapy at this point is tacrolimus + jakafi + belumosudil (now on month 4 of this). LFTs mildly elevated but not increased from prior to belumosudil. Dr. Pierce Brewster had wanted to add QVAR as well but could not get insurance approval for this.    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 Hepatic vasculature remains patent with normal flow. Coarsened, echogenic hepatic parenchyma suggestive of chronic liver disease. Gallbladder is unremarkable. No biliary ductal dilatation.     ID:   - Antiviral: Prophy valtrex 500mg  qd  - PJP: Prophy Bactrim DS qMWF    Viral Screening:  01/16/20: CMV/ EBV negative.     Immunizations:   Covid vaccine - 04/12/19 dose #1 and 05/02/19 dose # [[employer]].      GI: Abd pain - resolved.   - Endoscopy w/several , non-bleeding duodenal ulcers.   -  Pancreatic insufficiency w/foul smelling diarrhea much improved on Creon. Encouraged continued well-balanced intake (inc. Adequate protein) and exercise to try and build muscle mass. Weight stable.  - Worsening reflux symptoms associated with dysphagia to large pills concerning for potential upper GI involvement of cGvHD - Will refer for luminal GI consultation for further evaluation.  - Continue nexium 40mg  daily.    FEN/Renal:  Chronic Renal Insufficiency:   - Baseline post-SCT 1.8-2.1, now better stable at 1.2-1.5 (1.6 today).     CV:   Hx hyperlipidemia:  - Following for this as well with his PCP & endocrinologist    Hx cardiomyopathy:  - Per Care Everywhere: hx chemotherapy induced cardiomyopathy.   - [facility] at [facility] w/EF 50-55%.    ** PET scan from [facility], found to have age advanced coronary artery atherosclerosis.  - Was on ASA but stopped after GI bleed.      HTN:  - Normotensive on amlodipine 5mg  qd.     Pulm:   - DLCOc: 61.2%. Hx pulmonary fibrosis following ABVD therapy.   - Pulmonary nodules resolved, repeat imaging scheduled.   - Lung GvHD as above  08/10/17: PET and CT negative.    Seasonal allergic rhinitis:   - Azelastine-fluticasone spray bid and allegra prn.     Neuro/Pain:   - No issues.     Psych:   Hx depression:   - Mood is stable on Zoloft 100mg  qd.      Insomnia:   - Lunesta prn.       Endocrine:   Hx hypothyroidism:   - Monitoring and medication adjustments per endocrinologist, Dr. Ronelle Coffee.      Hx Uncontrolled DM II:  - Followed by Dr. Arley Bending in [place] (Endocrinology).   - Worsened by steroids, now off.   - Had been uncontrolled for months, Prior HgbA1C 9.5  - We have tapered prednisone off as above, Being managed by Dr. Ronelle Coffee. Now on insulin pump + monitoring    Osteopenia:  - seen on Dexa 04/2020, worst T-score 2.1     Musculoskeletal:  DEXA:  11/23/15: Mildly low bone density. Started calcium per protocol. Zometa given 11/23/15 (completed course].   07/08/17: Re-started Ca w/D BID while on steroids.   09/09/17: Repeat DEXA showed osteoporosis with worsened bone density in the spine, stable in the femur.   04/16/20: DEXA with osteopenia - Would like to give denosumab - was denied. 12/31/20: Ostepenia as above   - Could not get approval for denosumab, however he did receive zometa    Summary: 38yrs post 10/10 MUD allo HSCT for relapsed HL, currently fully donor with no active e/o disease, prior treatment with autoSCT and anti-CD30 CAR-Tc (July 2016). Now has diagnosis of pulmonary cGvHD (no biopsy done; appreciate assistance from Dr. Pierce Brewster in pulmonology in making the clinical diagnosis and formulating a treatment plan). Ocular cGvHD persists and is stable -> Cont. jakafi + Tacrolimus + belumosudil + autologous serum tears. Also continuing ursodiol as Dr. Almetta Jacquet has prescribed.  - Consulting luminal GI given worsening reflux symptoms and dysphagia to pills c/f upper GI tract involvement of his cGvHD  - Will continue to follow with [facility] and with Hepatology.  - Agree with continuing current dose of autologous serum tears     I personally spent over 60 minutes face-to-face and non-face-to-face in the care of this patient, which includes all pre, intra, and post visit time on the date of service.

## 2023-04-21 DIAGNOSIS — C8190 Hodgkin lymphoma, unspecified, unspecified site: Principal | ICD-10-CM

## 2023-04-22 DIAGNOSIS — C8190 Hodgkin lymphoma, unspecified, unspecified site: Principal | ICD-10-CM

## 2023-04-27 ENCOUNTER — Ambulatory Visit
Admit: 2023-04-27 | Discharge: 2023-04-27 | Payer: PRIVATE HEALTH INSURANCE | Attending: Internal Medicine | Primary: Internal Medicine

## 2023-04-27 ENCOUNTER — Inpatient Hospital Stay: Admit: 2023-04-27 | Discharge: 2023-04-27 | Payer: PRIVATE HEALTH INSURANCE

## 2023-04-27 DIAGNOSIS — Z9481 Bone marrow transplant status: Principal | ICD-10-CM

## 2023-04-27 DIAGNOSIS — R1310 Dysphagia, unspecified: Principal | ICD-10-CM

## 2023-04-27 DIAGNOSIS — D89811 Chronic graft-versus-host disease: Principal | ICD-10-CM

## 2023-04-27 DIAGNOSIS — J984 Other disorders of lung: Principal | ICD-10-CM

## 2023-04-27 NOTE — Unmapped (Signed)
-   follow-up as instructed    - for questions please reach out to my nurse Liesl:    --> Phone: [phone]    --> Fax: [phone]

## 2023-04-27 NOTE — Unmapped (Signed)
 Referring Physician: Dr Gayl Katos    Chief Complaint: GVHD complications    HPI   58yo male with a PMH significant for hodgkins lymphoma s/p allo-SCT 08/2015 complicated by eye and liver GVHD who developed SOB this spring with a cough. The SOB was notable in the spring when he went to the beach with his family and could not keep up on walks and tired going up and down the house steps. He denies any inciting event such as an infection or an exposures. He feels it has been gradually getting worse. Some improvement with albuterol . Has a cough with it but not productive    Today  - cough improved with trelegy  - has been doing gaviscon PRN and feels it helps... not doing it scheduled  - feels the reflux    ROS   - the balance of 10 systems is negative other than noted above     PMH   - Hodgkins Lymphoma s/p allo stem cell transplant    --> diagnoised 2014 s/p ABVD, ICE    --> auto SCT 07/2014, CAR-T    --> Allo SCT 08/2015    --> GVHD in eyes, liver  - Pancreatic Insuffiencey    FH   - No lung disease in family    SH   - Never smoker  - no work exposures  - no birds    MEDS (personally reviewed in EPIC, pertinent meds noted below)     PHYSICAL EXAM   Vitals - BP 126/82 (BP Site: L Arm, BP Position: Sitting)  - Pulse 74  - Temp 36.1 ??C (97 ??F) (Temporal)  - Ht 174.7 cm (5' 8.78)  - Wt 79.4 kg (175 lb)  - SpO2 100%  - BMI 26.01 kg/m??   Gen - awake, alert, in NAD   Derm - no rash   HEENT - no adenopathy, TMs clear   Vascular - good pulses throughout, no JVP   CV - RRR,no murmers or gallops   Pulm - CTA bilaterally   Abd - soft, NT, ND, +BS, no hepatosplenomegally   Ext - no edema, no clubbing   Joints - no enlarged joints, no warmth, redness or effusions   Neuro - CN grossly intact, nl gait     RESULTS     Labs (reviewd in EPIC, pertinent values noted below)       PFTs (personally reveiewed and interpreted)     Date: FVC (% Pred) FEV1 (% Pred) FEF25-75(% Pred) DLCO TBBx/Results                    04/27/23  3.67  1.80   78% 12/01/22  3.74  1.74   63%  Rezorouck start    09/09/22  3.62 (83%)  1.76 (52%)    Ratio <LLN, no BD response                       6 Minute Walk   - walked 581m  - lowest sat was 97% on RA    CT Chest (personally reviewed and interpretted)   - Airway inflammation with no interstitial lung disease or bronchiectasis         A/P   58yo male with a PMH significant for hodgkins lymphoma s/p allo-SCT 08/2015 complicated by eye and liver GVHD with new obstructive lung disease    Obstructive lung disease  - HRCT scan consistent with airway inflammation and gas trapping  - PFTs with moderate  obstructive lung disease with no BD response  - ECHO 2021 with normal EF and no pulm HTN  - Known SCT with GVHD  - GERD as below  - Overall, this is most consistent with pulmonary GVHD with a BOS pattern    --> will continue trelegy    --> would add QVAR to double up on the airway steroids and QVAR is smaller particle, getting better into small airways... unable to get insurance approval    --> will continue azithro    --> will continue singulair     --> agree with prograf     --> agree with jakafi     --> Started rezourock 200mg , 6 months into it    GERD/ERD  - he does note some aspiration symptoms  - modified barium swallow OK  - barium swallow with dysmotility    --> will get pH/Mano, scheduled for next month  - behavioral modifications discussed  - gaviscon liquid rather than tablets, advised him to schedule it after melas and prior to bed  - nexium   - GI referral    PCP Proph:  - bactrim     [facility]  - Will defer to PCP    Vaccines  - Influenza: 09/2022  - RSV: not eligible  - TDAP: 12/2016  - Prevnar 20: 04/2022  - Shinrix: complete 12/2016  - COVID: 09/2022    Complex medical decision making was done as we adjust medications based on blood work/drug levels and multiple complaints and problems were addresed

## 2023-05-20 MED ORDER — ESZOPICLONE 2 MG TABLET
ORAL_TABLET | 0 refills | 0.00000 days
Start: 2023-05-20 — End: ?

## 2023-05-25 NOTE — Unmapped (Signed)
 GI CONSULTATION NOTE      REFERRING PHYSICIAN:    Rhesa Celeste, MD  [address]  [address]  [place],  [place] [address]    Cc:  Vonn Guard, MD           ASSESSMENT:     This is a 58 y.o. year old male with medical hx significant for GERD, colonic polyps, pulmonary fibrosis and Hodgkin lymphoma - status post stem cell transplant (hx of GVHD), who presents to the GI clinic for further evaluation of dysphagia.  DDx includes but is not limited to structural lesion/mass, stenosis, EoE, Esophageal Dysmotility, Impaired motility, Functional dysphagia, severe GERD, Zenker's Diverticulum, EGJ outflow obstruction, among others.  Mr Neas did have a MBS performed in Nov 2024 which revealed no evidence of laryngeal penetration or tracheal aspiration.  Would recommend maximal medical therapy for treatment of GERD (in order to not further compromise pulmonary status) with esomeprazole 40 mg daily and the use of liquid Gaviscon after meals and before bedtime.  Would also recommend upper endoscopy at which time therapeutics such as biopsies and/or dilation will be performed as indicated.  Given history of colonic polyps, would recommend that he undergo colonoscopy back to back.  I will reach out to his oncologist who Mr. Top reports told him that he required admission to be monitored during next colonoscopy prep.    Further recommendations to be made pending results/response of GI procedures.  Future consideration likely to include esophageal manometry (to assess esophageal motility) and/or pH impedance monitoring (to assess for reflux; and provide information as to whether or not patient may benefit from an antireflux surgery (if he were a surgical candidate); especially in setting of compromised pulmonary status).  Will proceed as follows:    PLAN:    1.  Continue esomeprazole 40 mg daily.  Updated prescription E faxed to his pharmacy on file.  2.  Continue liquid Gaviscon: 30 mL after meals and before bedtime.  Explained that this creates a raft over the gastric contents preventing them from escaping into the esophagus.  3.  Schedule for upper endoscopy and colonoscopy.  Will reach out to his oncologist regarding safe preparation for this procedure, specifically colonoscopy prep.  4.  Implement dietary and lifestyle modifications for treatment of GERD.  Literature provided per AVS.  5.  Follow the ups and downs of swallowing per AVS.  6.  Further recommendations to be made pending results of upper endoscopy.  Future consideration likely to include esophageal manometry (to assess motility of the esophagus) and/or pH impedance monitoring (to assess both acidic and nonacidic reflux events and its correlation to symptoms).  If pH impedance monitoring reveals poorly controlled acid reflux, he may benefit from an antireflux surgery which in turn would likely benefit his compromised pulmonary status.  7.  Follow-up per above.  Patient is to reach out in the interim with questions and or concerns.    [ This dictation was done with [facility] Naturally [employer]. Document was reviewed but inadvertent errors in transcription may be present ]    The patient reports they are currently: at home. I spent 28 minutes on the real-time audio and video visit with the patient on the date of service. I spent an additional 16 minutes on pre- and post-visit activities on the date of service.      The patient was not located and I was not located within 250 yards of a hospital based location during the  real-time audio and video visit. The patient was physically located in [place]  or a state in which I am permitted to provide care. The patient and/or parent/guardian understood that s/he may incur co-pays and cost sharing, and agreed to the telemedicine visit. The visit was reasonable and appropriate under the circumstances given the patient's presentation at the time.     The patient and/or parent/guardian has been advised of the potential risks and limitations of this mode of treatment (including, but not limited to, the absence of in-person examination) and has agreed to be treated using telemedicine. The patient's/patient's family's questions regarding telemedicine have been answered.     If the visit was completed in an ambulatory setting, the patient and/or parent/guardian has also been advised to contact their provider???s office for worsening conditions, and seek emergency medical treatment and/or call 911 if the patient deems either necessary.        CHIEF COMPLAINT:   DARRYLL RAJU is a 58 y.o. male (DOB:  [birth year]) who is seen in consultation at the request of Dr Pierce Brewster for dysphagia and odonyphagia.    HISTORY OF PRESENT ILLNESS:    Gamal Todisco is a pleasant 58 year old male with past medical history of GERD, DM, DKA, pulmonary fibrosis, hyperlipidemia, hypothyroidism, hypertension, osteoporosis, Hodgkin lymphoma - status post stem cell transplant, and GVHD who presents to the GI clinic for further evaluation of dysphagia.  He endorses both difficulties with the actual initiation of the swallowing mechanism; as well as things getting hung up in his high in mid esophagus after a successful swallow.  I feel like there is often a delayed reaction when I swallow and things sit there.  Dysphagia occurs on a daily basis with pills, and sometimes water.  He has no difficulties with food.  He is conscientious with careful eating by taking small bites, chewing well, and alternating fluids with solids.  Mr. Beaulieu has a history of gastroesophageal reflux disease for which he takes esomeprazole 40 mg daily and Gaviscon after meals and before bedtime.  Despite medical therapy, he continues to have frequent heartburn and episodes of acidic regurgitation that often wake him up at night.    Mr. Schueler had a modified barium swallow performed on November 14, 2022.  This revealed no evidence of laryngeal penetration or tracheal aspiration.    Mr. Magid last upper endoscopy and colonoscopy were performed on July 22, 2017.  Colonoscopy revealed internal hemorrhoids and 3 polyps which were resected and retrieved.  Pathology from biopsies obtained revealed the following:  D: Large bowel, cecum, biopsy  - Adenomatous polyp (2 fragments)  - No graft-versus-host disease or CMV viral cytopathic effect identified     E: Large bowel, biopsy  - Unremarkable colonic mucosa (multiple fragments)  - No evidence for microscopic colitis or other inflammatory process  - Hyperplastic polyp (1 fragment)  - No graft-versus-host disease or CMV viral cytopathic effect identified     F: Large bowel, hepatic flexure, biopsy  - Hyperplastic polyp (1 fragment)  - Adenomatous polyp (1 fragment)  - No graft-versus-host disease or CMV viral cytopathic effect identified    Recommendations were to repeat surveillance colonoscopy in 5 years.    Upper endoscopy was performed on July 22, 2017.  This revealed multiple nonbleeding duodenal ulcers with clean ulcer base (Forrest class III).  Pathology from biopsies obtained revealed the following:  A: Stomach, biopsy  - Oxyntic mucosa with PPI effect  - No graft versus host disease or CMV viral  cytopathic effect identified     B: Small bowel, duodenum, biopsy  - No significant pathologic abnormality  - No graft-versus-host disease or CMV viral cytopathic effect identified     C: Esophagus, biopsy  - No significant pathologic abnormality  - No graft-versus-host disease or CMV viral cytopathic effect identified    Mr Klingler reports significant difficulty with colonoscopy prep in 2019, leading to hospital admission.  Per review of notes in Epic, he had a drop in Hgb to 7 (hgb was 15 six days prior), s/p blood transfusion.  He tells me today that his oncologist advised any follow-up colonoscopy would require admission to be monitored during the prep.    PAST MEDICAL HISTORY:    Past Medical History: Diagnosis Date    Atherosclerosis     Chemotherapy induced cardiomyopathy (HHS-HCC) 02/01/2013    per [facility] Everywhere records. ECHO at [facility] w/EF 50-55%,    Depression     DKA (diabetic ketoacidosis)       DM (diabetes mellitus)       Dry eyes     GVHD (graft versus host disease)       H/O folliculitis     Reports chronic hx of intermittent folliculitis. Multiple abscesses. Had large right groin abscess in March. Required I/d from surgeon.    Headache     Hepatic artery aneurysm (CMS-HCC)     repaired in 2015    Hodgkin's lymphoma       post allogeneic stem cell transplant    HTN (hypertension)     Hyperlipidemia     Hypothyroidism     Lung nodule     small sub-cm nodules on pre-treatment CT    Obesity (BMI 30.0-34.9)     Pulmonary fibrosis       With ABVD therapy    Seasonal allergies     Type 2 diabetes mellitus without complication   11/16/2015     Patient Active Problem List    Diagnosis Date Noted    Combined forms of age-related cataract of both eyes 12/16/2021    Osteoporosis 07/19/2019    Type 2 diabetes mellitus without complication, with long-term current use of insulin   10/01/2017    Binge eating 10/01/2017    Keratitis sicca, bilateral 09/10/2017    Graft-versus-host disease, chronic   08/12/2017    Immunosuppressed status (HHS-HCC) 12/12/2015    Hypomagnesemia 12/12/2015    Osteopenia determined by x-ray 11/23/2015    Hypothyroidism (acquired) 11/16/2015    Depression 11/16/2015    S/P allogeneic bone marrow transplant   11/14/2015    History of auto stem cell transplant   11/01/2014    History of immunotherapy 11/01/2014    Stem cells transplant status   07/18/2014    Hodgkin lymphoma, mixed cellularity   02/15/2014    Hodgkin's lymphoma in relapse   02/15/2014        PAST SURGICAL HISTORY:  Past Surgical History:   Procedure Laterality Date    CENTRAL VENOUS CATHETER INSERTION      EYE SURGERY      hepatic aneurysm repair  01/13/2013    INCISION AND DRAINAGE OF WOUND      Multiple abscesses from folliculitis. Last I/D in March 2016.    IR EMBOLIZATION ARTERIAL OTHER THAN HEMORRHAGE  02/04/2013    IR EMBOLIZATION ARTERIAL OTHER THAN HEMORRHAGE 02/04/2013    MULTIPLE TOOTH EXTRACTIONS  01/14/2015    PR COLONOSCOPY W/BIOPSY SINGLE/MULTIPLE N/A 07/22/2017    Procedure: COLONOSCOPY, FLEXIBLE, PROXIMAL TO  SPLENIC FLEXURE; WITH BIOPSY, SINGLE OR MULTIPLE;  Surgeon: Loel Ring, MD;  [facility]: [facility];  Service: Gastroenterology    PR COLSC FLX W/RMVL OF TUMOR POLYP LESION SNARE TQ N/A 07/22/2017    Procedure: COLONOSCOPY FLEX; W/REMOV TUMOR/LES BY SNARE;  Surgeon: Loel Ring, MD;  Location: [facility];  Service: Gastroenterology    PR UPPER GI ENDOSCOPY,BIOPSY N/A 07/22/2017    Procedure: UGI ENDOSCOPY; WITH BIOPSY, SINGLE OR MULTIPLE;  Surgeon: Loel Ring, MD;  Location: [facility];  Service: Gastroenterology    PR XCAPSL CTRC RMVL INSJ IO LENS PROSTH W/O ECP Right 12/17/2021    Procedure: EXTRACAPSULAR CATARACT REMOVAL W/INSERTION OF INTRAOCULAR LENS PROSTHESIS, MANUAL OR MECHANICAL TECHNIQUE WITHOUT ENDOSCOPIC CYCLOPHOTOCOAGULATION;  Surgeon: Vesta Gourd, MD;  Location[facility];  Service: Ophthalmology    PR XCAPSL CTRC RMVL INSJ IO LENS PROSTH W/O ECP Left 02/25/2022    Procedure: EXTRACAPSULAR CATARACT REMOVAL W/INSERTION OF INTRAOCULAR LENS PROSTHESIS, MANUAL OR MECHANICAL TECHNIQUE WITHOUT ENDOSCOPIC CYCLOPHOTOCOAGULATION;  Surgeon: Vesta Gourd, MD;  Location: [facility] OR [facility];  Service: Ophthalmology    SUPERFICIAL LYMPH NODE BIOPSY / EXCISION Left 12/31/2012    TONSILLECTOMY         ALLERGIES:  No Known Allergies    MEDICATIONS:    Patient's Medications   New Prescriptions    No medications on file   Previous Medications    ACYCLOVIR (ZOVIRAX) 400 MG TABLET    Take 1 tablet (400 mg total) by mouth two (2) times a day.    ALBUTEROL HFA 90 MCG/ACTUATION INHALER    Inhale 2 puffs every six (6) hours as needed for wheezing.    AMLODIPINE (NORVASC) 5 MG TABLET    Take 1 tablet (5 mg total) by mouth daily.    AZELASTINE-FLUTICASONE 137-50 MCG/SPRAY SPRY    1 spray into each nostril Two (2) times a day.    AZITHROMYCIN (ZITHROMAX) 250 MG TABLET    Take 1 tablet (250 mg total) by mouth daily. Take one 250mg  tablet on Mondays, Wednesdays, and Fridays (as part of FAM therapy)    BELUMOSUDIL 200 MG TAB    Take 1 tablet (200 mg) by mouth daily.    BENZONATATE (TESSALON PERLES) 100 MG CAPSULE    Take 1 capsule (100 mg total) by mouth Three (3) times a day as needed for cough.    BUDESONIDE (PULMICORT) 180 MCG/ACTUATION INHALER    Inhale 1 puff two (2) times a day.    CALCIUM CARBONATE (TUMS) 200 MG CALCIUM (500 MG) CHEWABLE TABLET    Chew 1 tablet (500 mg total) daily.    CALCIUM CARBONATE-VITAMIN D3 600 MG (1,500 MG)-800 UNIT CHEW    Chew 1 tablet Two (2) times a day.    CARBOXYMETHYLCELLULOSE SODIUM (REFRESH CELLUVISC) 1 % DPGE    1 drop every four (4) hours as needed.    CARBOXYMETHYLCELLULOSE SODIUM (THERATEARS OPHT)    Apply 1 drop to eye every hour.    ESOMEPRAZOLE (NEXIUM) 40 MG CAPSULE    Take 1 capsule (40 mg total) by mouth daily before breakfast.    ESZOPICLONE (LUNESTA) 2 MG TAB    TAKE 1 TABLET BY MOUTH IMMEDIATELY BEFORE BEDTIME    FLUTICASONE-UMECLIDIN-VILANTER (TRELEGY ELLIPTA) 200-62.5-25 MCG DSDV    Inhale 1 puff daily.    FREESTYLE LIBRE 14 DAY SENSOR KIT    USE AS DIRECTED EVERY 14 DAYS    HYPROMELLOSE (SYSTANE GEL OPHT)  Apply to eye.    LEVOTHYROXINE (SYNTHROID, LEVOTHROID) 300 MCG TABLET    Take 1 tablet (300 mcg total) by mouth daily.    METFORMIN (GLUCOPHAGE) 500 MG TABLET    TAKE 1 TABLET BY MOUTH TWICE A DAY WITH MEALS    METFORMIN (GLUCOPHAGE) 500 MG TABLET    Take 1 tablet (500 mg total) by mouth in the morning and 1 tablet (500 mg total) in the evening. Take with meals.    MONTELUKAST (SINGULAIR) 10 MG TABLET    Take 1 tablet (10 mg total) by mouth daily.    MULTIVITAMIN THERAPEUTIC WITH MINERALS (THERA-M) 27-0.4 MG TAB    Take 1 tablet by mouth daily.    NON FORMULARY    Autologous serum tears , 100% concentration QID for 3 months with 3 refills    NOVOLOG FLEXPEN U-100 INSULIN 100 UNIT/ML (3 ML) INJECTION PEN    INJECT 3 TO 8 UNITS 3 TIMES A DAY    OCULAR LUBRICANT (RESTORE PM) 57.3-42.5 % OINT    1 Application Three (3) times a day.    OMEGA-3 ACID ETHYL ESTERS (LOVAZA) 1 GRAM CAPSULE    Take 2 capsules (2 g total) by mouth Two (2) times a day.    PANCRELIPASE, LIP-PROT-AMYL, (CREON) 24,000-76,000 -120,000 UNIT CPDR DELAYED RELEASE CAPSULE    Take 3 capsules by mouth 3 times a day with meals and 2 capsules 2 times a day with snacks    RUXOLITINIB (JAKAFI) 10 MG TABLET    Take 1 tablet (10 mg total) by mouth two (2) times a day.    SERTRALINE (ZOLOFT) 100 MG TABLET    Take 1 tablet (100 mg total) by mouth daily.    SULFAMETHOXAZOLE-TRIMETHOPRIM (BACTRIM DS) 800-160 MG PER TABLET    TAKE 1 TABLET (160 MG OF TRIMETHOPRIM TOTAL) BY MOUTH EVERY MONDAY, WEDNESDAY, AND FRIDAY.    TACROLIMUS (PROGRAF) 1 MG CAPSULE    Take 3 capsules (3 mg total) by mouth daily AND 4 capsules (4 mg total) nightly.    TRESIBA FLEXTOUCH U-100 100 UNIT/ML (3 ML) INPN    INJECT 40 UNITS ONCE A DAY SUBCUTANEOUS    URSODIOL (ACTIGALL) 300 MG CAPSULE    TAKE 1 CAPSULE BY MOUTH TWO TIMES A DAY.    VARENICLINE TARTRATE (TYRVAYA) 0.03 MG/SPRAY SPRM    0.03 mg into each nostril two (2) times a day.   Modified Medications    No medications on file   Discontinued Medications    No medications on file       SOCIAL HISTORY:    Social History     Socioeconomic History    Marital status: Single   Occupational History    Occupation: Museum/gallery curator   Tobacco Use    Smoking status: Former     Current packs/day: 0.00     Average packs/day: 1 pack/day for 31.0 years (31.0 ttl pk-yrs)     Types: Cigarettes     Start date: 07/06/1981     Quit date: 07/06/2012     Years since quitting: 10.8    Smokeless tobacco: Never   Vaping Use    Vaping status: Never Used   Substance and Sexual Activity    Alcohol use: Not Currently     Alcohol/week: 0.0 standard drinks of alcohol     Comment: rare use. beer.    Drug use: No   Social History Narrative    Due to prior employment, he lived in [place] from 2008-2010; also  lived in [place] in 2007.  He is divorced x 12 years and currently is in a monogamous relationship with a girlfriend. His girlfriend is in rehab recovering from alcohol abuse.  She has been there for over a year.  She is due to soon be released. He finds her to be a good support but does not want to have her serve as caregiver to avoid putting uneccesary stress during this fragile point in her recovery.  He has worked full time during most of his illness as he does not have sick time and does not get paid if he is not working.          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 Not sure of primary or mets       REVIEW OF SYSTEMS:    Pertinent positive and negatives are documented as per the HPI; all other systems reviewed are negative.    VITAL SIGNS & PE:  Not done due to virtual nature of encounter

## 2023-05-26 ENCOUNTER — Encounter
Admit: 2023-05-26 | Discharge: 2023-05-27 | Payer: PRIVATE HEALTH INSURANCE | Attending: Nurse Practitioner | Primary: Nurse Practitioner

## 2023-05-26 DIAGNOSIS — R1319 Other dysphagia: Principal | ICD-10-CM

## 2023-05-26 DIAGNOSIS — K219 Gastro-esophageal reflux disease without esophagitis: Principal | ICD-10-CM

## 2023-05-26 DIAGNOSIS — Z8601 Hx of colonic polyps: Principal | ICD-10-CM

## 2023-05-26 MED ORDER — ESOMEPRAZOLE MAGNESIUM 40 MG CAPSULE,DELAYED RELEASE
ORAL_CAPSULE | Freq: Every day | ORAL | 3 refills | 90.00000 days | Status: CP
Start: 2023-05-26 — End: 2024-05-25

## 2023-05-26 NOTE — Unmapped (Signed)
 It was good to meet you today. I wanted to ensure that you had a summary of my recommendations and ensure you had my contact information to reach out as needed:     Continue esomeprazole 40 mg daily for treatment of GERD.  Again, this takes some of the potency out of the refluxate.  Updated prescription E faxed to your pharmacy on file.    Continue liquid Gaviscon: 30 mL after meals and before bedtime.  Again, this creates a raft over the gastric contents preventing them from escaping into the esophagus.    Schedule for upper endoscopy and colonoscopy as discussed.  Again, I will reach out to you after speaking with Dr. Gayl Katos regarding whether or not he will require admission to be monitored during colon prep.    Implement dietary and lifestyle modifications for treatment of GERD.  Literature provided below.    Please try the following Ups and Downs of Swallowing     1.  Sit UP when eating: Sitting up will allow the esophagus to gain the most benefit out of one of nature???s most basic principle, gravity. Paying attention to posture will also help straight the esophagus.   2.  Slow DOWN, do not rush when eating  3.  Chew UP your food very carefully   4.  Drink DOWN a full glass of water with your pills   5.  Cut UP your food into very small pieces   6.  Cut DOWN on tough foods to chew (meats)     Further recommendations to be made pending response/results of above interventions.  Future consideration includes esophageal manometry (to assess motility of the esophagus) and/or pH impedance monitoring (to assess reflux).    ---------------    It is important that you remain active in your healthcare.  I've outlined recommendations from today's clinic visit. If further work-up, tests or consultations are recommended, you should be contacted to get these scheduled.  If for any reason, you do NOT hear from someone within 7 days to get these scheduled, you should reach out to me via EPIC MyChart or at one of the numbers below and together, we'll ensure you get the appropriate appointments.   Again, you need to take responsibility for your healthcare by ensuring appropriate follow-up.  I am here to assist you in getting the healthcare that you need and deserve.    Notes and/or results from recommended tests or consultation are to be sent to me, as the ordering provider.  After I receive these results, I will follow-up with you as appropriate by [facility] MyChart, phone, letter or at your scheduled follow-up clinic appointment.    If for whatever reason you do not hear from me within 7-10 days after you've completed any tests/work-up, but do require further recommendations and/or instructions based on the results prior to a scheduled follow-up clinic visit, please contact me.    MyChart messages and advice requests are now going to be sent to our clinical care team. A team member will respond to the message or send it to your provider if needed.   The message will typically be addressed within 3-4 business days if it is an issue that can be handled by MyChart.   For multiple questions, complex concerns, and/or if you have not been by seen by your provider in the recommended follow-up period you may be asked to schedule a clinic visit.   MyChart messages are NOT read after hours or on weekends. MyChart is  NOT meant for urgent issues or emergencies. For emergencies call 911 or go to the nearest emergency department.      Please don't hesitate to reach out to me if you have questions or concerns in the interim.      Important phone numbers:    Nurse Coordinator line:  [phone] Ellison Ha, RN)    GI Clinic Appointments AND GI Procedure Appointments:  [phone]    Radiology: [id number], option 3 or 4  If you are being scheduled for any type of radiology, you will need to call to receive your appointment time.  Please call this number for information.     Speech Language Pathology (Modified Barium Swallows):  [phone]    For emergencies after normal business hours or on weekends/holidays   Proceed to the nearest emergency room OR contact the [facility] Operator at [phone] who can page the Gastroenterology Fellow on call.    Forms, Letters, Medical Records: [phone]   [email or web address]    ----------------    Gastroesophageal Reflux Disease (GERD): Care Instructions  Overview     Gastroesophageal reflux disease (GERD) is the backward flow of stomach acid into the esophagus. The esophagus is the tube that leads from your throat to your stomach. A one-way valve prevents the stomach acid from backing up into this tube. But when you have GERD, this valve does not close tightly enough. This can also cause pain and inflammation in your esophagus. (This is called esophagitis.) You may also hear GERD called acid reflux.  If you have mild GERD symptoms including heartburn, you may be able to control the problem with antacids or over-the-counter medicine. You can also make lifestyle changes to help reduce your symptoms. These include changing your diet and eating habits, such as not eating close to bedtime and staying at a weight that's healthy for you.  Follow-up care is a key part of your treatment and safety. Be sure to make and go to all appointments, and call your doctor if you are having problems. It's also a good idea to know your test results and keep a list of the medicines you take.  How can you care for yourself at home?  Take your medicines exactly as prescribed. Call your doctor if you think you are having a problem with your medicine.  Your doctor may recommend over-the-counter medicine. For mild or occasional indigestion, antacids, such as Tums, Mylanta, or Maalox, may help. Your doctor also may recommend over-the-counter acid reducers, such as famotidine (Pepcid AC), cimetidine (Tagamet HB), or omeprazole (Prilosec). Read and follow all instructions on the label. If you use these medicines often, talk with your doctor.  Stay at a weight that's healthy for you. Extra weight puts a lot of pressure on the valve between the stomach and esophagus. Losing even a few pounds can help. Talk to your doctor if you need help losing weight.  Change your eating habits.  Try to eat several small meals instead of two or three large meals.  After you eat, wait 2 to 3 hours before you lie down. Snacking close to bedtime can make your symptoms worse.  Avoid foods that make your symptoms worse. These may include chocolate, mint, alcohol, pepper, spicy foods, high-fat foods, or drinks with caffeine in them, such as tea, coffee, colas, or energy drinks. If your symptoms are worse after you eat a certain food, you may want to stop eating it to see if your symptoms get better.  Try to quit smoking  or chewing tobacco, or cut back as much as you can. If you need help quitting, talk to your doctor about quit-tobacco programs and medicines. These can increase your chances of quitting for good.  If you have GERD symptoms while trying to sleep, raise the head of your bed 6 to 8 inches by putting the frame on blocks or placing a foam wedge under the head of your mattress. (Adding extra pillows does not work.)  Do not wear tight clothing around your middle.  When should you call for help?   Call 911  anytime you think you may need emergency care. For example, call if:    You passed out (lost consciousness).   Call your doctor now or seek immediate medical care if:    You have new or worse belly pain.     Your stools are black and tarlike or have streaks of blood.     You vomit blood.   Watch closely for changes in your health, and be sure to contact your doctor if:    Your symptoms have not improved after 2 weeks.     Food seems to catch in your throat or chest.   Where can you learn more?  Go to [facility] at [email or web address] in the Menu. Enter [phone] in the search box to learn more about Gastroesophageal Reflux Disease (GERD): Care Instructions.  Current as of: October 31, 2021  Content Version: 14.3  ?? 2024 [place], [place].   Care instructions adapted under license by [facility]. If you have questions about a medical condition or this instruction, always ask your healthcare professional. Laren Player, [facility], disclaims any warranty or liability for your use of this information.

## 2023-06-01 DIAGNOSIS — C8190 Hodgkin lymphoma, unspecified, unspecified site: Principal | ICD-10-CM

## 2023-06-01 MED ORDER — ESZOPICLONE 2 MG TABLET
ORAL_TABLET | ORAL | 0 refills | 0.00000 days | Status: CP
Start: 2023-06-01 — End: ?

## 2023-06-05 DIAGNOSIS — R1310 Dysphagia, unspecified: Principal | ICD-10-CM

## 2023-06-05 DIAGNOSIS — Z8601 History of colon polyps: Principal | ICD-10-CM

## 2023-06-05 NOTE — Unmapped (Signed)
 I spoke with Zachary Mathis via phone this morning to review MyChart msg sent 06/01/23 (that he never read).    Informed him that Dr Gayl Katos was OK for him to do colon MiraLAX prep at home (does not require admission).  I also offered him anesthesia evaluation (given PMHx); however he declined, stating he was quite familiar with these GI procedures.    Orders submitted for EGD and Colonoscopy.  Phone number provided for Zachary Mathis to call and set up appointment.

## 2023-06-11 DIAGNOSIS — C8190 Hodgkin lymphoma, unspecified, unspecified site: Principal | ICD-10-CM

## 2023-06-11 NOTE — Unmapped (Signed)
 I spoke with patient Zachary Mathis to confirm appointments on the following date(s): 08/31/2023.  Pt confirmed new date and times.    [name]

## 2023-06-12 MED ORDER — PEG 3350-ELECTROLYTES 236 GRAM-22.74 GRAM-6.74 GRAM-5.86 GRAM SOLUTION
0 refills | 0.00000 days | Status: CP
Start: 2023-06-12 — End: ?

## 2023-06-12 NOTE — Unmapped (Signed)
 EGD  Procedure #1   Colonoscopy  Procedure #2   [id number]  MRN   GENERIC  Endoscopist     Is the patient's health insurance [employer], [employer], [place] [employer] (UHC), [employer] Med [place], [employer], or [place]?     Urgent procedure     Are you pregnant?     Are you in the process of scheduling or awaiting results of a heart ultrasound, stress test, or catheterization to evaluate new or worsening chest pain, dizziness, or shortness of breath?     Do you take: Plavix (clopidogrel), Coumadin (warfarin), Lovenox (enoxaparin), Pradaxa (dabigatran), Effient (prasugrel), Xarelto (rivaroxaban), Eliquis (apixaban), Pletal (cilostazol), or Brilinta (ticagrelor)?          Did ordering provider indicate how long to hold this medication in the order comments?          Which of the above medications are you taking?          What is the name of the medical practice that manages this medication?          What is the name of the medical provider who manages this medication?     Do you have hemophilia, von Willebrand disease, or low platelets?     Do you have a pacemaker or implanted cardiac defibrillator?     Has a [place] [facility] provider specified the location(s)?     Which location(s) did the [facility] provider specify?          [facility]          [place]          [facility]     Do you see a liver specialist for chronic liver disease?     Is the procedure indication for variceal screening?     Is procedure indication for variceal banding (this does NOT include variceal screening)?     Have you had a heart attack, stroke or heart stent placement within the past 6 months?     Month of event     Year of event (ONLY ENTER LAST 2 DIGITS)        5  Height (feet)   9  Height (inches)   180  Weight (pounds)   26.6  BMI          Did the ordering provider specify a bowel prep?          What bowel prep was specified?     Do you have an ostomy (bag on your stomach that collects your stool)?          Is it an ileostomy?          Is it a colostomy?          Patient doesn't know.     Do you have chronic kidney disease?     Do you have chronic constipation or have you had poor quality bowel preps for past colonoscopies?     Do you have Crohn's disease or ulcerative colitis?     Have you had weight loss surgery?          When you walk around your house or grocery store, do you have to stop and rest due to shortness of breath, chest pain, or light-headedness?     Do you ever use supplemental oxygen?     Have you been hospitalized for cirrhosis of the liver or heart failure in the last 12 months?     Have you been treated for mouth or throat  cancer with radiation or surgery?     Have you been told that it is difficult for doctors to insert a breathing tube in you during anesthesia?     Have you had a heart or lung transplant?          Are you on dialysis?     Do you have cirrhosis of the liver?     Do you have myasthenia gravis?     Is the patient a prisoner?   ################# ## ###################################################################################################################   MRN:  [id number]   Anticoag Review  No   Nurse Triage  No   GI clinic consult  No   Procedure(s):  EGD     Colonoscopy   Endoscopist:  GENERIC   Urgent:  No   Prep:  Nulytely Prep                  --------------------------- --- ----------------------------------------------------------------------------------------------------------------------------------------------------------------------------   [facility] Locations:  [facility]     [facility]     [place]        Requested Locations:              ################# ## ###################################################################################################################

## 2023-06-15 NOTE — Unmapped (Signed)
 [facility] Clinical Assessment & Refill Coordination Note    Zachary Mathis, DOB: [birth year]  Phone: [phone] (home)     All above HIPAA information was verified with patient.     Was a Nurse, learning disability used for this call? No    Specialty Medication(s):   Hematology/Oncology: Rezurock     Current Outpatient Medications   Medication Sig Dispense Refill    acyclovir (ZOVIRAX) 400 MG tablet Take 1 tablet (400 mg total) by mouth two (2) times a day. 180 tablet 3    albuterol HFA 90 mcg/actuation inhaler Inhale 2 puffs every six (6) hours as needed for wheezing. 8 g 1    amLODIPine (NORVASC) 5 MG tablet Take 1 tablet (5 mg total) by mouth daily. 90 tablet 3    azelastine-fluticasone 137-50 mcg/spray Spry 1 spray into each nostril Two (2) times a day.      azithromycin (ZITHROMAX) 250 MG tablet Take 1 tablet (250 mg total) by mouth daily. Take one 250mg  tablet on Mondays, Wednesdays, and Fridays (as part of FAM therapy) 90 tablet 3    belumosudil 200 mg Tab Take 1 tablet (200 mg) by mouth daily. 30 tablet 5    benzonatate (TESSALON PERLES) 100 MG capsule Take 1 capsule (100 mg total) by mouth Three (3) times a day as needed for cough. 270 capsule 1    budesonide (PULMICORT) 180 mcg/actuation inhaler Inhale 1 puff two (2) times a day. 1 each 5    calcium carbonate (TUMS) 200 mg calcium (500 mg) chewable tablet Chew 1 tablet (500 mg total) daily.      calcium carbonate-vitamin D3 600 mg (1,500 mg)-800 unit Chew Chew 1 tablet Two (2) times a day. 100 tablet 0    carboxymethylcellulose sodium (REFRESH CELLUVISC) 1 % DpGe 1 drop every four (4) hours as needed.      carboxymethylcellulose sodium (THERATEARS OPHT) Apply 1 drop to eye every hour.      esomeprazole (NEXIUM) 40 MG capsule Take 1 capsule (40 mg total) by mouth daily before breakfast. 90 capsule 3    eszopiclone (LUNESTA) 2 MG Tab TAKE 1 TABLET BY MOUTH IMMEDIATELY BEFORE BEDTIME 15 tablet 0    fluticasone-umeclidin-vilanter (TRELEGY ELLIPTA) 200-62.5-25 mcg DsDv Inhale 1 puff daily. 28 each 11    FREESTYLE LIBRE 14 DAY SENSOR kit USE AS DIRECTED EVERY 14 DAYS  4    hypromellose (SYSTANE GEL OPHT) Apply to eye.      levothyroxine (SYNTHROID, LEVOTHROID) 300 MCG tablet Take 1 tablet (300 mcg total) by mouth daily.  1    metFORMIN (GLUCOPHAGE) 500 MG tablet TAKE 1 TABLET BY MOUTH TWICE A DAY WITH MEALS 180 tablet 3    metFORMIN (GLUCOPHAGE) 500 MG tablet Take 1 tablet (500 mg total) by mouth in the morning and 1 tablet (500 mg total) in the evening. Take with meals. 180 tablet 3    montelukast (SINGULAIR) 10 mg tablet Take 1 tablet (10 mg total) by mouth daily. 30 tablet 3    multivitamin therapeutic with minerals (THERA-M) 27-0.4 mg Tab Take 1 tablet by mouth daily.      NON FORMULARY Autologous serum tears , 100% concentration QID for 3 months with 3 refills 15 each 0    NOVOLOG FLEXPEN U-100 INSULIN 100 unit/mL (3 mL) injection pen INJECT 3 TO 8 UNITS 3 TIMES A DAY      ocular lubricant (RESTORE PM) 57.3-42.5 % Oint 1 Application Three (3) times a day.  omega-3 acid ethyl esters (LOVAZA) 1 gram capsule Take 2 capsules (2 g total) by mouth Two (2) times a day. 120 capsule 5    pancrelipase, Lip-Prot-Amyl, (CREON) 24,000-76,000 -120,000 unit CpDR delayed release capsule Take 3 capsules by mouth 3 times a day with meals and 2 capsules 2 times a day with snacks 360 capsule 5    polyethylene glycol (GOLYTELY) 236-22.74-6.74 gram solution Take by mouth as directed per [facility] GI prep instructions, for split bowel prep. 4000 mL 0    ruxolitinib (JAKAFI) 10 mg tablet Take 1 tablet (10 mg total) by mouth two (2) times a day. 60 tablet 6    sertraline (ZOLOFT) 100 MG tablet Take 1 tablet (100 mg total) by mouth daily. 90 tablet 3    sulfamethoxazole-trimethoprim (BACTRIM DS) 800-160 mg per tablet TAKE 1 TABLET (160 MG OF TRIMETHOPRIM TOTAL) BY MOUTH EVERY MONDAY, WEDNESDAY, AND FRIDAY. 12 tablet 5    tacrolimus (PROGRAF) 1 MG capsule Take 3 capsules (3 mg total) by mouth daily AND 4 capsules (4 mg total) nightly. 210 capsule 6    TRESIBA FLEXTOUCH U-100 100 unit/mL (3 mL) InPn INJECT 40 UNITS ONCE A DAY SUBCUTANEOUS      ursodioL (ACTIGALL) 300 mg capsule TAKE 1 CAPSULE BY MOUTH TWO TIMES A DAY. 180 capsule 1    varenicline tartrate (TYRVAYA) 0.03 mg/spray sprm 0.03 mg into each nostril two (2) times a day. 10 mL 5     Current Facility-Administered Medications   Medication Dose Route Frequency Provider Last Rate Last Admin    loteprednol (LOTEMAX) 0.5 % ophthalmic suspension 1 drop  1 drop Both Eyes Daily Morck, Debra J         Facility-Administered Medications Ordered in Other Visits   Medication Dose Route Frequency Provider Last Rate Last Admin    alteplase (ACTIVASE) 2 mg injection small catheter clearance             influenza vaccine quad (FLUARIX, FLULAVAL, FLUZONE) (6 MOS & UP) 2020-21 ADS Med             pneumococcal polysacchride (23-valps) (PNEUMOVAX) 25 mcg/0.5 mL vaccine                 Changes to medications: Mateusz reports no changes at this time.    Medication list has been reviewed and updated in Epic: Yes    No Known Allergies    Changes to allergies: No    Allergies have been reviewed and updated in Epic: Yes    SPECIALTY MEDICATION ADHERENCE     Rezurock 200 mg: 0 doses of medicine on hand       Medication Adherence    Patient reported X missed doses in the last month: >5  Specialty Medication: Rezurock 200 mg  Patient is on additional specialty medications: No  Informant: patient          Specialty medication(s) dose(s) confirmed: Regimen is correct and unchanged.     Are there any concerns with adherence? No    Adherence counseling provided? Will use mychart    CLINICAL MANAGEMENT AND INTERVENTION      Clinical Benefit Assessment:    Do you feel the medicine is effective or helping your condition? Yes    Clinical Benefit counseling provided? Not needed    Adverse Effects Assessment:    Are you experiencing any side effects? No    Are you experiencing difficulty administering your medicine? No    Quality of Life Assessment:  Quality of Life    Rheumatology  Oncology  1. What impact has your specialty medication had on the reduction of your daily pain or discomfort level?: None  2. On a scale of 1-10, how would you rate your ability to manage side effects associated with your specialty medication? (1=no issues, 10 = unable to take medication due to side effects): 1  Dermatology  Cystic Fibrosis          How many days over the past month did your condition  keep you from your normal activities? For example, brushing your teeth or getting up in the morning. 0    Have you discussed this with your provider? Not needed    Acute Infection Status:    Acute infections noted within Epic:  No active infections    Patient reported infection: None    Therapy Appropriateness:    Is therapy appropriate based on current medication list, adverse reactions, adherence, clinical benefit and progress toward achieving therapeutic goals? Yes, therapy is appropriate and should be continued     Clinical Intervention:    Was an intervention completed as part of this clinical assessment? No    DISEASE/MEDICATION-SPECIFIC INFORMATION      N/A    Oncology: Is the patient receiving adequate infection prevention treatment? Not applicable  Does the patient have adequate nutritional support? Not applicable    PATIENT SPECIFIC NEEDS     Does the patient have any physical, cognitive, or cultural barriers? No    Is the patient high risk? No    Does the patient require physician intervention or other additional services (i.e., nutrition, smoking cessation, social work)? No    Does the patient have an additional or emergency contact listed in their chart? Yes    SOCIAL DETERMINANTS OF HEALTH     At the [facility], we have learned that life circumstances - like trouble affording food, housing, utilities, or transportation can affect the health of many of our patients.   That is why we wanted to ask: are you currently experiencing any life circumstances that are negatively impacting your health and/or quality of life? No    Social Drivers of Health     Food Insecurity: Not on file   Tobacco Use: Medium Risk (05/26/2023)    Patient History     Smoking Tobacco Use: Former     Smokeless Tobacco Use: Never     Passive Exposure: Not on file   Transportation Needs: Not on file   Alcohol Use: Not on file   Housing: Not on file   Physical Activity: Not on file   Utilities: Not on file   Stress: Not on file   Interpersonal Safety: Not on file   Substance Use: Not on file (11/17/2022)   Intimate Partner Violence: Not on file   Social Connections: Not on file   Financial Resource Strain: Not on file   Health Literacy: Not on file   Internet Connectivity: Not on file       Would you be willing to receive help with any of the needs that you have identified today? Not applicable       SHIPPING     Specialty Medication(s) to be Shipped:   Hematology/Oncology: Rezurock    Other medication(s) to be shipped: No additional medications requested for fill at this time     Changes to insurance: No    Cost and Payment: Patient has a $0 copay, payment information is not required.  Delivery Scheduled: Yes, Expected medication delivery date: 06/17/23.     Medication will be delivered via UPS to the confirmed prescription address in [facility].    The patient will receive a drug information handout for each medication shipped and additional FDA Medication Guides as required.  Verified that patient has previously received a Conservation officer, historic buildings and a Surveyor, mining.    The patient or caregiver noted above participated in the development of this care plan and knows that they can request review of or adjustments to the care plan at any time.      All of the patient's questions and concerns have been addressed.    Lexys Milliner Dennard Fisher, PharmD   [facility] Specialty Pharmacist

## 2023-06-16 MED FILL — REZUROCK 200 MG TABLET: ORAL | 30 days supply | Qty: 30 | Fill #4

## 2023-06-17 ENCOUNTER — Other Ambulatory Visit (HOSPITAL_BASED_OUTPATIENT_CLINIC_OR_DEPARTMENT_OTHER): Payer: Self-pay | Admitting: Endocrinology

## 2023-06-17 DIAGNOSIS — E7800 Pure hypercholesterolemia, unspecified: Secondary | ICD-10-CM

## 2023-06-28 DIAGNOSIS — D89811 Chronic graft-versus-host disease: Principal | ICD-10-CM

## 2023-06-29 ENCOUNTER — Other Ambulatory Visit: Admit: 2023-06-29 | Discharge: 2023-06-29 | Payer: PRIVATE HEALTH INSURANCE

## 2023-06-29 ENCOUNTER — Ambulatory Visit
Admit: 2023-06-29 | Discharge: 2023-06-29 | Payer: PRIVATE HEALTH INSURANCE | Attending: Hematology & Oncology | Primary: Hematology & Oncology

## 2023-06-29 DIAGNOSIS — K8689 Other specified diseases of pancreas: Principal | ICD-10-CM

## 2023-06-29 DIAGNOSIS — D89811 Chronic graft-versus-host disease: Principal | ICD-10-CM

## 2023-06-29 DIAGNOSIS — D849 Immunodeficiency, unspecified: Principal | ICD-10-CM

## 2023-06-29 DIAGNOSIS — C8190 Hodgkin lymphoma, unspecified, unspecified site: Principal | ICD-10-CM

## 2023-06-29 DIAGNOSIS — E039 Hypothyroidism, unspecified: Principal | ICD-10-CM

## 2023-06-29 DIAGNOSIS — C819A Hodgkin's disease in remission, unspecified Hodgkin lymphoma type: Principal | ICD-10-CM

## 2023-06-29 DIAGNOSIS — H16223 Keratoconjunctivitis sicca, not specified as Sjogren's, bilateral: Principal | ICD-10-CM

## 2023-06-29 DIAGNOSIS — Z9481 Bone marrow transplant status: Principal | ICD-10-CM

## 2023-06-29 DIAGNOSIS — Z9484 Stem cells transplant status: Principal | ICD-10-CM

## 2023-06-29 DIAGNOSIS — R1310 Dysphagia, unspecified: Principal | ICD-10-CM

## 2023-06-29 DIAGNOSIS — K219 Gastro-esophageal reflux disease without esophagitis: Principal | ICD-10-CM

## 2023-06-29 LAB — CBC W/ AUTO DIFF
BASOPHILS ABSOLUTE COUNT: 0.1 10*9/L (ref 0.0–0.1)
BASOPHILS RELATIVE PERCENT: 0.6 %
EOSINOPHILS ABSOLUTE COUNT: 0.2 10*9/L (ref 0.0–0.5)
EOSINOPHILS RELATIVE PERCENT: 2 %
HEMATOCRIT: 41.8 % (ref 39.0–48.0)
HEMOGLOBIN: 14.3 g/dL (ref 12.9–16.5)
LYMPHOCYTES ABSOLUTE COUNT: 2.9 10*9/L (ref 1.1–3.6)
LYMPHOCYTES RELATIVE PERCENT: 34.7 %
MEAN CORPUSCULAR HEMOGLOBIN CONC: 34.2 g/dL (ref 32.0–36.0)
MEAN CORPUSCULAR HEMOGLOBIN: 35.7 pg — ABNORMAL HIGH (ref 25.9–32.4)
MEAN CORPUSCULAR VOLUME: 104.5 fL — ABNORMAL HIGH (ref 77.6–95.7)
MEAN PLATELET VOLUME: 7.3 fL (ref 6.8–10.7)
MONOCYTES ABSOLUTE COUNT: 0.8 10*9/L (ref 0.3–0.8)
MONOCYTES RELATIVE PERCENT: 9.4 %
NEUTROPHILS ABSOLUTE COUNT: 4.5 10*9/L (ref 1.8–7.8)
NEUTROPHILS RELATIVE PERCENT: 53.3 %
PLATELET COUNT: 326 10*9/L (ref 150–450)
RED BLOOD CELL COUNT: 4 10*12/L — ABNORMAL LOW (ref 4.26–5.60)
RED CELL DISTRIBUTION WIDTH: 15.4 % — ABNORMAL HIGH (ref 12.2–15.2)
WBC ADJUSTED: 8.4 10*9/L (ref 3.6–11.2)

## 2023-06-29 LAB — COMPREHENSIVE METABOLIC PANEL
ALBUMIN: 3.7 g/dL (ref 3.4–5.0)
ALKALINE PHOSPHATASE: 197 U/L — ABNORMAL HIGH (ref 46–116)
ALT (SGPT): 88 U/L — ABNORMAL HIGH (ref 10–49)
ANION GAP: 7 mmol/L (ref 5–14)
AST (SGOT): 83 U/L — ABNORMAL HIGH (ref <=34)
BILIRUBIN TOTAL: 0.6 mg/dL (ref 0.3–1.2)
BLOOD UREA NITROGEN: 23 mg/dL (ref 9–23)
BUN / CREAT RATIO: 13
CALCIUM: 9.8 mg/dL (ref 8.7–10.4)
CHLORIDE: 105 mmol/L (ref 98–107)
CO2: 29 mmol/L (ref 20.0–31.0)
CREATININE: 1.71 mg/dL — ABNORMAL HIGH (ref 0.73–1.18)
EGFR CKD-EPI (2021) MALE: 46 mL/min/1.73m2 — ABNORMAL LOW (ref >=60)
GLUCOSE RANDOM: 119 mg/dL (ref 70–179)
POTASSIUM: 4.6 mmol/L (ref 3.4–4.8)
PROTEIN TOTAL: 6.9 g/dL (ref 5.7–8.2)
SODIUM: 141 mmol/L (ref 135–145)

## 2023-06-29 LAB — TACROLIMUS LEVEL, TROUGH: TACROLIMUS, TROUGH: 21.2 ng/mL (ref 5.0–15.0)

## 2023-06-29 LAB — MAGNESIUM: MAGNESIUM: 1.7 mg/dL (ref 1.6–2.6)

## 2023-06-29 MED ORDER — TACROLIMUS 1 MG CAPSULE, IMMEDIATE-RELEASE
ORAL_CAPSULE | Freq: Two times a day (BID) | ORAL | 6 refills | 30.00000 days | Status: CP
Start: 2023-06-29 — End: 2024-06-28

## 2023-06-29 MED ADMIN — heparin, porcine (PF) 100 unit/mL injection 500 Units: 500 [IU] | INTRAVENOUS | @ 13:00:00 | Stop: 2023-06-29

## 2023-06-29 NOTE — Unmapped (Signed)
 BMTCT Routine Follow-up Clinic Note:    Patient Name: Zachary Mathis  Medical Record Number:  [id number]  Encounter Date: 06/29/2023    BMT Attending MD: Odis Specking, MD    Allogeneic SCT: 08/29/15 [10/10, CMV+, ABO matched, male]  CAR T cell infusion [[id number]]: 08/04/14  Autologous SCT: 07/18/14    HPI:   Zachary Mathis is a 58 y.o. male with a diagnosis of Hodgkins Lymphoma.  Zachary Mathis now presents for routine follow-up post allogeneic stem cell transplant. His course has been complicated by chronic GvHD involving the mucous membranes, eyes, and now lung.     He was last hospitalized after transfer from OSH [7/7 - 07/23/17] with DKA and an acute drop in hgb which were thought to be r/t GI bleeding. During his admission, he had colonoscopy and EGD which did not reveal an actively bleeding sites but revealed multiple non bleeding duodenal ulcers. His pathology was not c/w GHVD or viral cytopathic effect.        He has been seen Dr. Leonor and Dr. Gwenn for persistent diarrhea, where workup revealed a low stool elastase and he was diagnosed with pancreatic insufficiency. This has now resolved with pancreatic enzyme supplementation. His still has loose stools intermittently for which he takes Imodium prn. He has also seen Dr. Drucie with Hepatology who has prescribed ursodiol (Dr. Drucie has now left [facility] and he has not yet established care with a new Hepatologist). He is now followed in the BMT clinic to monitor his cGVHD jeni have been active for years, now also with pulmonary involvement], immune suppression levels, viral and immune monitoring, and chimerism monitoring.     For DM, he is continuing therapy with an insulin pump + monitoring. Followed by his endocrinologist for this.       Interval History:   Since his last visit, Zachary Mathis cough and dyspnea are essentially the same. Appreciate the assistance of Dr. Diana in helping to optimize his medical management of pulmonary cGvHD.  Lung exam is showing improvement in air movement (with no wheezing, rhonchi, or chest tightness) compared to last year, similar to last visit.  He continues to work full time although this is becoming more stressful. Ocular cGvHD symtoms persist and are not substantially improved over prior with the use of autologous serum tears - However his symptoms also do not seem to be worsening. These are also better in the summer (probably due to the humidity) and better with an indoor humidifier that he uses at home. He is able to work but occasionally limited by having to rest his eyes for a time to recover, also able to drive mostly but occasionally unable to drive as well, somewhat unchanged. Has to wear goggles when driving a convertible due to ocular sun sensitivity. He is using his serum drops that help temporarily but then his dryness returns after a few hours - Plans to continue these. His ophthalmologist is continuing to follow him for cataract care as well - He had right eye cataract removal surgery in December 2023, after which he recovered well and is doing well with vision 20/25 in his right eye.      Reflux symptoms had worsened and are stable from his last visit, worse with lying (and requiring him to sleep with pillows for support), without a change in diet (including no increase in spicy foods or alcohol). Also has developed some persistent dysphagia, sometimes bad with pills and requiring him to cut his meat into small pieces.  He saw [facility] and has an endoscopy planned for next month. No oral, mucosal or skin symptoms, loss of joint ROM, and no other symptoms of cGvHD.    He is now on tacrolimus and we are continuing to follow levels - currently at 3.5mg  po bid (taking 3qAM and 4qPM), which has been a stable dose for him for over a year.  Appetite remains good and he is more active (unchanged from most recent visits).  No change to stooling.  No nausea.  His endocrinologist has switched him to a continuous insulin pump which he is tolerating well and is controlling his blood glucose levels better.     EBV was detectable in the peripheral blood (< 100) on 09/12/19 -> ND since.      ROS:   PMH and PSH have been reviewed and there are no changes.      SocHx:  There are no pertinent changes to [facility].  The patient is residing at home and he continues to work. Has also been playing music with a group for recreation.     Hematology/Oncology History   Hodgkin lymphoma, mixed cellularity      12/13/2012 Initial Diagnosis    Hodgkin lymphoma, mixed cellularity (RAF-HCC).  Stage at initial diagnosis: IIIB (bone marrow negative, ESR > 50, LDH 297, alb < 4.0, age 60)     12/13/2012 - 08/12/2013 Chemotherapy    6 cycles of ABVD. 1. Small sub-cm lung nodules on pre-treatment CT, not thought to represent lymphoma; 2. Pulmonary fibrosis with ABVD     12/31/2012 Biopsy    Lymph node, left supraclavicular, excision - Classical Hodgkin lymphoma, mixed cellularity subtype     01/25/2013 Biopsy    Normocellular bone marrow (60%) with trilineage hematopoiesis; negative for lymphoma     12/13/2013 -  Other    Patient discovered a palpable L supraclavicular node     01/26/2014 -  Cancer Staged    Deauville 5: Avid lymphoma in cervical, left axillary, mediastinal, left hilar, and periaortic nodes.      01/26/2014 Progression    PET/CT interpreted as lymphoma progression in the neck, chest, and abdomen/pelvis (along with hepatic steatosis and coronary artery calcifications).       02/13/2014 - 05/15/2014 Chemotherapy    Started ICE therapy in February with last cycle in May 2016. Received 4 cycles.      06/05/2014 Biopsy    Mildly hypercellular bone marrow (60%) with trilineage hematopoiesis.  No morphologic evidence of classical Hodgkin lymphoma     06/06/2014 -  Cancer Staged    PET/CT: Response to therapy of hypermetabolic adenopathy since 04/07/2016CT and 01/26/2014 PET. No residual malignant range activityidentified.2. No new sites of disease.3. Hepatic steatosis.4. Age advanced coronary artery atherosclerosis.      06/22/2014 - 06/23/2014 Chemotherapy    Stem cell mobilization with etoposide.     07/03/2014 -  Other    Stem cell collection.  CD34+DOSECOLLECT: 6.80      07/07/2014 -  Cancer Staged    CT scan: IMPRESSION: 1. Slightly decreased size of axillary, mediastinal, and retroperitoneal lymphadenopathy. 2. Hepatic steatosis.      07/18/2014 Bone Marrow Transplant    Autologus stem cell transplant     08/04/2014 Bone Marrow Transplant    CART infusion     01/30/2015 Progression    Mediastinal Relapse     02/21/2015 - 04/12/2015 Chemotherapy    3 cycles of bendamustine + brentuximab       Allergies:  Patient  has no known allergies.     Medications:    Current Outpatient Medications on File Prior to Visit   Medication Sig    acyclovir (ZOVIRAX) 400 MG tablet Take 1 tablet (400 mg total) by mouth two (2) times a day.    albuterol HFA 90 mcg/actuation inhaler Inhale 2 puffs every six (6) hours as needed for wheezing.    amLODIPine (NORVASC) 5 MG tablet Take 1 tablet (5 mg total) by mouth daily.    azelastine-fluticasone 137-50 mcg/spray Spry 1 spray into each nostril Two (2) times a day.    azithromycin (ZITHROMAX) 250 MG tablet Take 1 tablet (250 mg total) by mouth daily. Take one 250mg  tablet on Mondays, Wednesdays, and Fridays (as part of FAM therapy)    belumosudil 200 mg Tab Take 1 tablet (200 mg) by mouth daily.    benzonatate (TESSALON PERLES) 100 MG capsule Take 1 capsule (100 mg total) by mouth Three (3) times a day as needed for cough.    budesonide (PULMICORT) 180 mcg/actuation inhaler Inhale 1 puff two (2) times a day.    calcium carbonate (TUMS) 200 mg calcium (500 mg) chewable tablet Chew 1 tablet (500 mg total) daily.    calcium carbonate-vitamin D3 600 mg (1,500 mg)-800 unit Chew Chew 1 tablet Two (2) times a day.    carboxymethylcellulose sodium (REFRESH CELLUVISC) 1 % DpGe 1 drop every four (4) hours as needed.    carboxymethylcellulose sodium (THERATEARS OPHT) Apply 1 drop to eye every hour.    esomeprazole (NEXIUM) 40 MG capsule Take 1 capsule (40 mg total) by mouth daily before breakfast.    eszopiclone (LUNESTA) 2 MG Tab TAKE 1 TABLET BY MOUTH IMMEDIATELY BEFORE BEDTIME    fluticasone-umeclidin-vilanter (TRELEGY ELLIPTA) 200-62.5-25 mcg DsDv Inhale 1 puff daily.    FREESTYLE LIBRE 14 DAY SENSOR kit USE AS DIRECTED EVERY 14 DAYS    hypromellose (SYSTANE GEL OPHT) Apply to eye.    levothyroxine (SYNTHROID, LEVOTHROID) 300 MCG tablet Take 1 tablet (300 mcg total) by mouth daily.    metFORMIN (GLUCOPHAGE) 500 MG tablet TAKE 1 TABLET BY MOUTH TWICE A DAY WITH MEALS    metFORMIN (GLUCOPHAGE) 500 MG tablet Take 1 tablet (500 mg total) by mouth in the morning and 1 tablet (500 mg total) in the evening. Take with meals.    montelukast (SINGULAIR) 10 mg tablet Take 1 tablet (10 mg total) by mouth daily.    multivitamin therapeutic with minerals (THERA-M) 27-0.4 mg Tab Take 1 tablet by mouth daily.    NON FORMULARY Autologous serum tears , 100% concentration QID for 3 months with 3 refills    NOVOLOG FLEXPEN U-100 INSULIN 100 unit/mL (3 mL) injection pen INJECT 3 TO 8 UNITS 3 TIMES A DAY    ocular lubricant (RESTORE PM) 57.3-42.5 % Oint 1 Application Three (3) times a day.    omega-3 acid ethyl esters (LOVAZA) 1 gram capsule Take 2 capsules (2 g total) by mouth Two (2) times a day.    pancrelipase, Lip-Prot-Amyl, (CREON) 24,000-76,000 -120,000 unit CpDR delayed release capsule Take 3 capsules by mouth 3 times a day with meals and 2 capsules 2 times a day with snacks    polyethylene glycol (GOLYTELY) 236-22.74-6.74 gram solution Take by mouth as directed per [facility] GI prep instructions, for split bowel prep.    ruxolitinib (JAKAFI) 10 mg tablet Take 1 tablet (10 mg total) by mouth two (2) times a day.    sertraline (ZOLOFT) 100 MG tablet Take 1  tablet (100 mg total) by mouth daily.    sulfamethoxazole-trimethoprim (BACTRIM DS) 800-160 mg per tablet TAKE 1 TABLET (160 MG OF TRIMETHOPRIM TOTAL) BY MOUTH EVERY MONDAY, WEDNESDAY, AND FRIDAY.    tacrolimus (PROGRAF) 1 MG capsule Take 3 capsules (3 mg total) by mouth daily AND 4 capsules (4 mg total) nightly.    TRESIBA FLEXTOUCH U-100 100 unit/mL (3 mL) InPn INJECT 40 UNITS ONCE A DAY SUBCUTANEOUS    ursodioL (ACTIGALL) 300 mg capsule TAKE 1 CAPSULE BY MOUTH TWO TIMES A DAY.    varenicline tartrate (TYRVAYA) 0.03 mg/spray sprm 0.03 mg into each nostril two (2) times a day.     Current Facility-Administered Medications on File Prior to Visit   Medication    alteplase (ACTIVASE) 2 mg injection small catheter clearance    influenza vaccine quad (FLUARIX, FLULAVAL, FLUZONE) (6 MOS & UP) 2020-21 ADS Med    loteprednol (LOTEMAX) 0.5 % ophthalmic suspension 1 drop    pneumococcal polysacchride (23-valps) (PNEUMOVAX) 25 mcg/0.5 mL vaccine     Objective:  Vitals:    06/29/23 0903   BP: 140/87   Pulse: 78   Resp: 18   Temp: 36.3 ??C (97.4 ??F)   TempSrc: Temporal   SpO2: 96%   Weight: 82.8 kg (182 lb 9.6 oz)         KPS: 80%, Normal activity with effort; some signs or symptoms of disease (ECOG equivalent 1).     Chronic GVHD Assessment     Skin: BSA based features None (0), % BSA involved: No rash (0), non-BSA based features No sclerotic features (0), Other features:  NONE  Mouth: No symptoms (0) Lichen planus-like features present (Diagnostic) no  Eyes: Moderate dry eyes, partially affecting ADL, lubrication drops >3x per day/punctal plugs, WITHOUT new vision impairment (2) Ocular GvHD confirmed by opthalmologist yes  GI Tract: features None No symptoms (0)  Liver: Normal total bili and ALT/AP <3x ULN (0)  Lungs: symptom score Moderate symptoms with SOB after walking on flat ground (2), last FEV1 worsened since prior testing   Joints and Fascia: symptom score No symptoms (0) P-ROM shoulder 7, Elbow 7, Wrist/Fingers 7, Ankles 4      Genital tract: Not examined  Other features due to cGVHD: None    Overall cGVHD Score (current):  Moderate (eyes, lung)    Physical Exam:  General appearance: Middle aged WM, appears well, in no distress  Head: Normocephalic, without obvious abnormality  Eyes: +dryness, +mild erythema noted on surrounding conjunctiva on exam today (similar to prior exams), sclera white, non-incteric  Neck: no palpable adenopathy  Mouth: MMM, no ulcers, no mucoceles or lichenoid changes, several missing teeth/ plaque noted (oral exam improved from last year)  Lungs: Clear to auscultation bilaterally, no wheezing, good air movement and comfortable work of breathing  Heart: Regular rate and rhythm, S1, S2 normal, no murmur, no edema   Abdomen: soft, non-tender, normal BS  Extremities: No edema as above  Skin: No rashes  Neuro: No focal deficits     Lab Results   Component Value Date    WBC 8.4 06/29/2023    HGB 14.3 06/29/2023    HCT 41.8 06/29/2023    PLT 326 06/29/2023    NEUTROABS 4.5 06/29/2023    EOSABS 0.2 06/29/2023     Lab Results   Component Value Date    NA 141 06/29/2023    K 4.6 06/29/2023    CL 105 06/29/2023    CO2 29.0 06/29/2023  BUN 23 06/29/2023    CREATININE 1.71 (H) 06/29/2023    GLU 119 06/29/2023    CALCIUM 9.8 06/29/2023    MG 1.7 06/29/2023     Lab Results   Component Value Date    BILITOT 0.6 06/29/2023    PROT 6.9 06/29/2023    ALBUMIN 3.7 06/29/2023    ALT 88 (H) 06/29/2023    AST 83 (H) 06/29/2023    ALKPHOS 197 (H) 06/29/2023    LDH 175 10/03/2019     Assessment/Plan:  Disease/BMT: Complicated transplant history including auto SCT (07/18/14), CAR-T infusion (CART-T LCCC 1524 trial; 08/04/14), and lastly allogeneic HSCT (at [facility]; 08/29/15) for relapsed HD. He presented back to [facility] to re-establish care with Dr. Jerrell on D+77 s/p MUD HSCT.     ** For complete transplant note and CAR-T infusion, see clinic note from 11/14/15.     HCT-CI: Unknown   Match: MUD (10/10, CMV+, ABO matched, male)  Conditioning: Pentostatin + 600 cGy TBI (RIC)  Reported Cell Dose: 3.93 x 10^6 CD34+ cells/kg.  Post-SCT BmBx Evaluations:  11/07/15: D+60 Post HSCT BmBx [at [facility]].      ** Per outside report: TLH, no e/o non-Hodgkin B-cell lymphoproliferative disease. No increase in CD34+ CD117+ myeloblasts.  11/27/15: D+90. Normocellular [50%] w/TLH and no e/o classical HL.     02/20/16: PET/CT w/ NED.  08/05/17: PET/CT w/NED. Deauville 1.  - No further surveillance scans - Will monitor based on clinical suspicion in the setting of symptoms      Chimerism:  - Full donor chimerism in blood and bone marrow since allo HSCT.     ** Will continue monitoring with yearly checks - Last checked August 2024.    Heme:  - Engrafted with normal Hgb and platelets, very mild leukocytosis intermittently improved.      GvHD:   Prophylaxis:  - MTX/Tac/. Siro stopped early due to AKI.    Tacrolimus:   - Stopped tacrolimus 11/05/16.    - Re-started 09/03/17 w/worsening liver GvHD [biopsy proven] and ocular cGVHD, now with lung GvHD as well (09/2022).   - Goal level 5-10. CTM    Ocular GvHD: Chronic and dry eyes are painful.      - Followed by [facility] Dineen at [facility] in Ophthalmology who placed dissolvable plugs and prescribed a regimen of artificial tears and po omega-3 FAs, now with Dr. Sable  -> symptoms improved in AM after fluorometholone added  -  Uses lubricating eye drops in addition to serum drops and ointment at night.  Also on systemic therapy with Jakafi + Belumosudil  + Tac.  -  Continuing to follow with Ophthalmology as well       GI/Hepatic:   07/23/17: Started 1mg /kg oral prednisone for transaminitis.   08/18/17: Liver biopsy results:    ** Histologic findings most c/w chronic graft-versus-host disease. Moderate portal/periportal fibrosis with widespread ductopenia involving 13/15 portal tracts on H&E stained section. Moderate to severe perisinusoidal fibrosis, irregularly distributed, but predominantly centrizonal.   08/20/17: Added Jakafi due to persistent dry eye and rising transaminases in light of liver bx results - steroid refractory by definition.      ** AST peaked at 416 [08/26/17]    ** ALT peaked at 369 [08/26/17]     ** Tbili was 1.5; Alk PO4 793   09/03/17: Liver fx improved. Added back tacrolimus.  09/08/17: Taper prednisone dose 35mg  po qd.   09/16/17: LFTs increased from last week. Increase Jakafi to 10  mg bid [having significant SI hyperglycemia]. Tac goal 5-10.   09/30/17: LFTs improved, taper prednisone to 25mg .    11/18/17: Taper pred to 5 mg.   07/19/19: Prednisone now off.  Continuing jakafi + tacrolimus. Tac goal 5-10 as above.  09/12/19: Transaminitis has flucuated, slightly up today.  Continues on Jakafi 10mg  BID + Tac.    10/03/19: Not sure if this worsening hepatic GvHD (as eyes have been stable and other symptoms resolved).   - Saw Dr. Drucie, who has prescribed ursodiol (continuing) and will continue to follow. Continuing belumosudil + jakafi + tacrolimus dosed per levels. Elevated transaminases at low levels thought to be 2/2 FLD at this point - Continuing to monitor.  - Worsening reflux symptoms associated with dysphagia to large pills concerning for potential upper GI involvement of cGvHD - Referred for luminal GI consultation with plan for EGD next month.    Pulmonary GvHD:  - 10/20/22: New cough since his 09/2022 visit: PFTs with worsening FEV1 c/w pulmonary GvHD. Appreciate assistance from Dr. Diana.  Has seen some symptomatic improvement with Trelegy (and tessalon perls).  I have prescribed Belumosudil 200mg /d and this was started in November 2024. Continuing monteleukast as well.   - Systemic GvHD therapy at this point is tacrolimus + jakafi + belumosudil (now on month 7 of this). LFTs mildly elevated but not increased from prior to belumosudil. Dr. Diana had wanted to add QVAR as well, but we could not get insurance approval for this.    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 Hepatic vasculature remains patent with normal flow. Coarsened, echogenic hepatic parenchyma suggestive of chronic liver disease. Gallbladder is unremarkable. No biliary ductal dilatation.     ID:   - Antiviral: Prophy valtrex 500mg  qd  - PJP: Prophy Bactrim DS qMWF    Viral Screening:  01/16/20: CMV/ EBV negative.     Immunizations:   Covid vaccine - 04/12/19 dose #1 and 05/02/19 dose # [[employer]].      GI: Abd pain - resolved.   - Endoscopy w/several , non-bleeding duodenal ulcers.   -  Pancreatic insufficiency w/foul smelling diarrhea much improved on Creon. Encouraged continued well-balanced intake (inc. Adequate protein) and exercise to try and build muscle mass. Weight stable.  - Worsening reflux symptoms associated with dysphagia to large pills concerning for potential upper GI involvement of cGvHD - Appreciate luminal GI consultation.  - Continue nexium 40mg  daily + gaviscon after meals. Plan for EGD next month.    FEN/Renal:  Chronic Renal Insufficiency:   - Baseline post-SCT 1.8-2.1, had been better at 1.2-1.5, but today 1.7 (up from 1.6 at last visit).  Reinforced the importance of adequate hydration and awaiting Tac trough level from this morning.    CV:   Hx hyperlipidemia:  - Following for this as well with his PCP & endocrinologist    Hx cardiomyopathy:  - Per Care Everywhere: hx chemotherapy induced cardiomyopathy.   - ECHO at [facility] w/EF 50-55%.    ** PET scan from [facility], found to have age advanced coronary artery atherosclerosis.  - Was on ASA but stopped after GI bleed.      HTN:  - Normotensive on amlodipine 5mg  qd.     Pulm:   - DLCOc: 61.2%. Hx pulmonary fibrosis following ABVD therapy.   - Pulmonary nodules resolved, repeat imaging scheduled.   - Lung GvHD as above  08/10/17: PET  and CT negative.    Seasonal allergic rhinitis:   - Azelastine-fluticasone spray bid and allegra prn.     Neuro/Pain:   - No issues.     Psych:   Hx depression:   - Mood is stable on Zoloft 100mg  qd.      Insomnia:   - Lunesta prn.       Endocrine:   Hx hypothyroidism:   - Monitoring and medication adjustments per endocrinologist, Dr. Tommas.      Hx Uncontrolled DM II:  - Followed by Dr. Bella in [place] (Endocrinology).   - Worsened by steroids, now off.   - Had been uncontrolled for months, Prior HgbA1C 9.5  - We have tapered prednisone off as above, Being managed by Dr. Tommas. Now on insulin pump + monitoring    Osteopenia:  - seen on Dexa 04/2020, worst T-score 2.1     Musculoskeletal:   DEXA:  11/23/15: Mildly low bone density. Started calcium per protocol. Zometa given 11/23/15 (completed course].   07/08/17: Re-started Ca w/D BID while on steroids.   09/09/17: Repeat DEXA showed osteoporosis with worsened bone density in the spine, stable in the femur.   04/16/20: DEXA with osteopenia - Would like to give denosumab - was denied.   12/31/20: Ostepenia as above   - Could not get approval for denosumab, however he did receive zometa    Summary: 25yrs post 10/10 MUD allo HSCT for relapsed HL, currently fully donor with no active e/o disease, prior treatment with autoSCT and anti-CD30 CAR-Tc (July 2016). Now has diagnosis of pulmonary cGvHD (no biopsy done; appreciate assistance from Dr. Diana in pulmonology in making the clinical diagnosis and formulating a treatment plan). Ocular cGvHD persists and is stable -> Cont. jakafi + Tacrolimus + belumosudil + autologous serum tears. Also continuing ursodiol as Dr. Drucie has prescribed.  - Consulted luminal GI given worsening reflux symptoms and dysphagia to pills c/f upper GI tract involvement of his cGvHD -> EGD planned.  - Will continue to follow with [facility] and with Hepatology.  - Agree with continuing current dose of autologous serum tears     I personally spent over 50 minutes face-to-face and non-face-to-face in the care of this patient, which includes all pre, intra, and post visit time on the date of service.

## 2023-06-29 NOTE — Unmapped (Signed)
 Called Zachary Mathis to find out which [facility] he preferred, lab orders faxed for 6/23 to [address] location, he verbalized understanding

## 2023-06-29 NOTE — Unmapped (Signed)
 Cellular Therapy Research Note:  Date: 06/29/2023    Clinical study: LCCC 1524: Phase I Study of the Administration of T Lymphocytes Expressing the CD30 Chimeric Antigen Receptor (CAR) for Prevention of Relapse of CD30+ Lymphomas after High Dose Therapy and Autologous Stem Transplantation (ATLAS)     Protocol day: Year 9    Performance Status: kps/ecog: KPS = 80 - Normal activity with effort; some signs or symptoms of disease. / ECOG equivalent 1     Zachary Mathis is a 58 y.o. male withs/p ASCT followed by CD30-directed CAR-T 08/04/14. He is also s/p alloSCT 8/2017under clinical trial LCCC 1524-ATL.  Patient is on abbreviated follow-up per protocol. The patient returned to clinic today for follow up with Dr. Jerrell for follow up post transplant. He is overall doing well. (See encounter).         I further confirmed that patient has not had any long-term gene therapy side effects below since their cell therapy under this protocol:   New malignancy(ies)   Exacerbation of a pre-existing or new incidence of a neurologic disorder    Exacerbation of a prior or new incidence of a rheumatologic or other autoimmune disorder    New incidence of a hematologic disorder    New incidence of infection (potentially product-related).     Next protocol follow-up will be in 1 year per trial protocol. Patient aware to contact research staff should he have any protocol questions or concerns.

## 2023-06-29 NOTE — Unmapped (Signed)
 Bone Marrow Transplant and Cellular Therapy Program  Immunosuppressive Therapy Note    Zachary Mathis is a 58 y.o. 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 [name] is currently day +3268 (BMT Unknown Phase - Planned 07/18/2014), 2861 (BMT Unknown Phase - Planned 08/29/2015).    Current dose: tacrolimus 3 mg qAM + 4 mg qPM (using 1 mg capsules)    Goal tacrolimus Level: 5-10 ng/mL    Resulted level: 21.2 ng/mL drawn at 10:02 AM. Patient confirmed that he did not take his AM dose prior to lab draw in clinic and has been taking 3 x 1 mg caps in AM and 4 x 1 mg caps in PM. He reports that he forgets to take him AM doses in before noon and takes them typically around 2 PM, and takes his evening dose around 10 PM most days. Patient has not taken his morning dose for today yet.    Lab Results   Component Value Date/Time    TACROLIMUS 21.2 (HH) 06/29/2023 10:02 AM    TACROLIMUS 5.0 04/20/2023 08:46 AM    TACROLIMUS 15.4 (H) 01/19/2023 09:17 AM      Lab Results   Component Value Date/Time    CREATININE 1.71 (H) 06/29/2023 08:55 AM    CREATININE 1.60 (H) 04/20/2023 08:46 AM    CREATININE 1.42 (H) 01/19/2023 09:17 AM     Recommendation:   Tacrolimus level is supratherapeutic today   Scr further increased to 1.71   Tbili WNL, LFTs remain overall stably elevated  Given above, instructed patient to hold both doses of tacrolimus today and restart tacrolimus with AM dose on 6/17 at reduced dose of 2 mg twice daily. Plan to recheck CMP and tacrolimus level locally on Monday 07/06/23. Plan communicated with patient via phone and he verbalized understanding.      We will continue to monitor levels. Patient will be followed for changes in renal and hepatic function, toxicity, and efficacy.     Elizebeth Has, PharmD, BCOP  Clinical Pharmacist Practitioner, BMTCT

## 2023-07-06 ENCOUNTER — Ambulatory Visit (HOSPITAL_COMMUNITY)
Admission: RE | Admit: 2023-07-06 | Discharge: 2023-07-06 | Disposition: A | Discharge status: Home / Self Care | Payer: Self-pay | Source: Ambulatory Visit | Attending: Endocrinology | Admitting: Endocrinology

## 2023-07-06 DIAGNOSIS — E7800 Pure hypercholesterolemia, unspecified: Secondary | ICD-10-CM | POA: Insufficient documentation

## 2023-07-09 LAB — COMPREHENSIVE METABOLIC PANEL
ALBUMIN: 4.3 g/dL (ref 3.8–4.9)
ALKALINE PHOSPHATASE: 290 IU/L — ABNORMAL HIGH (ref 44–121)
ALT (SGPT): 96 IU/L — ABNORMAL HIGH (ref 0–44)
AST (SGOT): 73 IU/L — ABNORMAL HIGH (ref 0–40)
BILIRUBIN TOTAL (MG/DL) IN SER/PLAS: 0.6 mg/dL (ref 0.0–1.2)
BLOOD UREA NITROGEN: 25 mg/dL — ABNORMAL HIGH (ref 6–24)
BUN / CREAT RATIO: 13 (ref 9–20)
CALCIUM: 10.1 mg/dL (ref 8.7–10.2)
CHLORIDE: 100 mmol/L (ref 96–106)
CO2: 24 mmol/L (ref 20–29)
CREATININE: 1.9 mg/dL — ABNORMAL HIGH (ref 0.76–1.27)
EGFR: 41 mL/min/{1.73_m2} — ABNORMAL LOW
GLOBULIN, TOTAL: 2.7 g/dL (ref 1.5–4.5)
GLUCOSE: 195 mg/dL — ABNORMAL HIGH (ref 70–99)
POTASSIUM: 4.8 mmol/L (ref 3.5–5.2)
SODIUM: 142 mmol/L (ref 134–144)
TOTAL PROTEIN: 7 g/dL (ref 6.0–8.5)

## 2023-07-10 LAB — TACROLIMUS LEVEL: TACROLIMUS BLOOD: 7.7 ng/mL (ref 5.0–20.0)

## 2023-07-10 NOTE — Unmapped (Signed)
 Bone Marrow Transplant and Cellular Therapy Program  Immunosuppressive Therapy Note    Zachary Mathis is a 58 y.o. 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 Zachary Mathis is currently day +3279 (BMT Unknown Phase - Planned 07/18/2014), 2872 (BMT Unknown Phase - Planned 08/29/2015).    Current dose: tacrolimus  2 mg BID (using 1 mg capsules; doses held 6/16 reduced 6/17)    Goal tacrolimus  Level: 5-10 ng/mL    Resulted level: 7.7 drawn 6/25 at 0754 per [facility].    Lab Results   Component Value Date/Time    TACROLIMUS  7.7 07/08/2023 07:54 AM    TACROLIMUS  21.2 (HH) 06/29/2023 10:02 AM    TACROLIMUS  5.0 04/20/2023 08:46 AM    TACROLIMUS  15.4 (H) 01/19/2023 09:17 AM      Lab Results   Component Value Date/Time    CREATININE 1.90 (H) 07/08/2023 07:54 AM    CREATININE 1.71 (H) 06/29/2023 08:55 AM    CREATININE 1.60 (H) 04/20/2023 08:46 AM    CREATININE 1.42 (H) 01/19/2023 09:17 AM     Recommendation:   He previously reported that he sometimes forgets to take AM doses in before noon and takes them typically around 2 PM, and takes his evening dose around 10 PM most days  Tacrolimus  level is now therapeutic after dose reduced 6/16  Scr further increased to 1.42 -> 1.60 -> 1.71 -> 1.90 mg/dL  Tbili WNL, LFTs remain overall stably elevated  Given the continued increase in SCR, will plan to continue dose of 2 mg twice daily  Will recheck at clinic follow up or repeat labs locally; will discuss with Dr. Jerrell.         We will continue to monitor levels. Patient will be followed for changes in renal and hepatic function, toxicity, and efficacy.     JINNY Bernardino Gentry, PharmD, BCPS, BCOP, CPP  Bone Marrow Transplant and Cellular Therapy

## 2023-07-15 DIAGNOSIS — C8190 Hodgkin lymphoma, unspecified, unspecified site: Principal | ICD-10-CM

## 2023-07-15 NOTE — Unmapped (Signed)
 [facility] Specialty and Home Delivery Pharmacy Refill Coordination Note    Specialty Medication(s) to be Shipped:   Hematology/Oncology: Rezurock  200mg     Other medication(s) to be shipped: No additional medications requested for fill at this time     Zachary Mathis, DOB: 03-Jun-1965  Phone: [phone] (home)       All above HIPAA information was verified with patient.     Was a Nurse, learning disability used for this call? No    Completed refill call assessment today to schedule patient's medication shipment from the [facility]  [phone]).  All relevant notes have been reviewed.     Specialty medication(s) and dose(s) confirmed: Regimen is correct and unchanged.   Changes to medications: Eathon reports no changes at this time.  Changes to insurance: No  New side effects reported not previously addressed with a pharmacist or physician: None reported  Questions for the pharmacist: No    Confirmed patient received a Conservation officer, historic buildings and a Surveyor, mining with first shipment. The patient will receive a drug information handout for each medication shipped and additional FDA Medication Guides as required.       DISEASE/MEDICATION-SPECIFIC INFORMATION        N/A    SPECIALTY MEDICATION ADHERENCE     Medication Adherence    Patient reported X missed doses in the last month: 0  Specialty Medication: Rezurock  200mg               Were doses missed due to medication being on hold? No    Rezurock  200mg   : 10 days of medicine on hand       REFERRAL TO PHARMACIST     Referral to the pharmacist: Not needed      [facility]     Shipping address confirmed in Epic.     Cost and Payment: Patient has a $0 copay, payment information is not required.    Delivery Scheduled: Yes, Expected medication delivery date: 7/9.     Medication will be delivered via UPS to the prescription address in Epic WAM.    Zachary Mathis   [facility] Specialty and Home Delivery Pharmacy  Specialty Technician

## 2023-07-16 DIAGNOSIS — C8190 Hodgkin lymphoma, unspecified, unspecified site: Principal | ICD-10-CM

## 2023-07-16 DIAGNOSIS — Z9481 Bone marrow transplant status: Principal | ICD-10-CM

## 2023-07-19 DIAGNOSIS — Z9484 Stem cells transplant status: Principal | ICD-10-CM

## 2023-07-19 MED ORDER — SULFAMETHOXAZOLE 800 MG-TRIMETHOPRIM 160 MG TABLET
ORAL_TABLET | ORAL | 5 refills | 0.00000 days
Start: 2023-07-19 — End: ?

## 2023-07-20 MED ORDER — SULFAMETHOXAZOLE 800 MG-TRIMETHOPRIM 160 MG TABLET
ORAL_TABLET | ORAL | 5 refills | 28.00000 days | Status: CP
Start: 2023-07-20 — End: ?

## 2023-07-21 MED FILL — REZUROCK 200 MG TABLET: ORAL | 30 days supply | Qty: 30 | Fill #5

## 2023-07-23 LAB — COMPREHENSIVE METABOLIC PANEL
ALBUMIN: 4.5 g/dL (ref 3.8–4.9)
ALKALINE PHOSPHATASE: 246 IU/L — ABNORMAL HIGH (ref 44–121)
ALT (SGPT): 126 IU/L — ABNORMAL HIGH (ref 0–44)
AST (SGOT): 110 IU/L — ABNORMAL HIGH (ref 0–40)
BILIRUBIN TOTAL (MG/DL) IN SER/PLAS: 0.5 mg/dL (ref 0.0–1.2)
BLOOD UREA NITROGEN: 18 mg/dL (ref 6–24)
BUN / CREAT RATIO: 11 (ref 9–20)
CALCIUM: 9.7 mg/dL (ref 8.7–10.2)
CHLORIDE: 101 mmol/L (ref 96–106)
CO2: 23 mmol/L (ref 20–29)
CREATININE: 1.68 mg/dL — ABNORMAL HIGH (ref 0.76–1.27)
EGFR: 47 mL/min/1.73 — ABNORMAL LOW
GLOBULIN, TOTAL: 2.4 g/dL (ref 1.5–4.5)
GLUCOSE: 44 mg/dL — ABNORMAL LOW (ref 70–99)
POTASSIUM: 4.2 mmol/L (ref 3.5–5.2)
SODIUM: 142 mmol/L (ref 134–144)
TOTAL PROTEIN: 6.9 g/dL (ref 6.0–8.5)

## 2023-07-24 LAB — TACROLIMUS LEVEL: TACROLIMUS BLOOD: 13.9 ng/mL (ref 5.0–20.0)

## 2023-07-27 DIAGNOSIS — C8190 Hodgkin lymphoma, unspecified, unspecified site: Principal | ICD-10-CM

## 2023-07-27 NOTE — Unmapped (Signed)
 Bone Marrow Transplant and Cellular Therapy Program  Immunosuppressive Therapy Note    Zachary Mathis is a 58 y.o. 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 Zachary Mathis is currently day +3296 (BMT Unknown Phase - Planned 07/18/2014), 2889 (BMT Unknown Phase - Planned 08/29/2015).    Current dose: tacrolimus  2 mg BID (using 1 mg capsules; doses held 6/16 reduced 6/17)    Goal tacrolimus  Level: 5-10 ng/mL    Resulted level: 13.9 drawn 7/9 at 8:16 AM per [facility] and confirmed with patient this is true trough    Lab Results   Component Value Date/Time    TACROLIMUS  13.9 07/22/2023 08:16 AM    TACROLIMUS  7.7 07/08/2023 07:54 AM    TACROLIMUS  21.2 (HH) 06/29/2023 10:02 AM    TACROLIMUS  5.0 04/20/2023 08:46 AM    TACROLIMUS  15.4 (H) 01/19/2023 09:17 AM      Lab Results   Component Value Date/Time    CREATININE 1.68 (H) 07/22/2023 08:16 AM    CREATININE 1.90 (H) 07/08/2023 07:54 AM    CREATININE 1.71 (H) 06/29/2023 08:55 AM    CREATININE 1.60 (H) 04/20/2023 08:46 AM    CREATININE 1.42 (H) 01/19/2023 09:17 AM     Recommendation:   He previously reported that he sometimes forgets to take AM doses in before noon and takes them typically around 2 PM, and takes his evening dose around 10 PM most days  Tacrolimus  level is slightly supratherapeutic on 07/22/23  Scr slightly improved at 1.68  Tbili WNL, LFTs remain overall stably elevated  Given above, plan to decrease tacrolimus  dose to 2 mg in AM and 1 mg in PM   Will recheck labs locally on Monday 7/18 at [facility] in [place] ([place] location); plan with patient via phone and he verbalized understanding    We will continue to monitor levels. Patient will be followed for changes in renal and hepatic function, toxicity, and efficacy.     Elizebeth Has, PharmD, BCOP  Clinical Pharmacist Practitioner, BMTCT

## 2023-07-30 DIAGNOSIS — Z9481 Bone marrow transplant status: Principal | ICD-10-CM

## 2023-07-30 DIAGNOSIS — C8190 Hodgkin lymphoma, unspecified, unspecified site: Principal | ICD-10-CM

## 2023-07-30 MED ORDER — TACROLIMUS 1 MG CAPSULE, IMMEDIATE-RELEASE
ORAL_CAPSULE | ORAL | 6 refills | 30.00000 days | Status: CP
Start: 2023-07-30 — End: 2024-07-29

## 2023-08-04 DIAGNOSIS — C8190 Hodgkin lymphoma, unspecified, unspecified site: Principal | ICD-10-CM

## 2023-08-10 DIAGNOSIS — D89811 Chronic graft-versus-host disease: Principal | ICD-10-CM

## 2023-08-10 MED ORDER — REZUROCK 200 MG TABLET
ORAL_TABLET | Freq: Every day | ORAL | 5 refills | 30.00000 days | Status: CP
Start: 2023-08-10 — End: ?
  Filled 2023-09-17: qty 30, 30d supply, fill #0

## 2023-08-10 NOTE — Unmapped (Signed)
 Zachary Mathis has been contacted in regards to their refill of Rezurock . At this time, they have declined refill due to just opened a bottle. Refill assessment call date has been updated per the patient's request.

## 2023-08-24 DIAGNOSIS — C8190 Hodgkin lymphoma, unspecified, unspecified site: Principal | ICD-10-CM

## 2023-08-24 MED ORDER — MONTELUKAST 10 MG TABLET
ORAL_TABLET | Freq: Every day | ORAL | 3 refills | 30.00000 days
Start: 2023-08-24 — End: 2024-08-23

## 2023-08-24 MED ORDER — SERTRALINE 100 MG TABLET
ORAL_TABLET | Freq: Every day | ORAL | 3 refills | 90.00000 days
Start: 2023-08-24 — End: ?

## 2023-08-27 DIAGNOSIS — C8190 Hodgkin lymphoma, unspecified, unspecified site: Principal | ICD-10-CM

## 2023-08-27 MED ORDER — SERTRALINE 100 MG TABLET
ORAL_TABLET | Freq: Every day | ORAL | 3 refills | 90.00000 days | Status: CP
Start: 2023-08-27 — End: ?

## 2023-08-27 MED ORDER — MONTELUKAST 10 MG TABLET
ORAL_TABLET | Freq: Every day | ORAL | 3 refills | 30.00000 days | Status: CP
Start: 2023-08-27 — End: 2024-08-26

## 2023-08-27 NOTE — Unmapped (Signed)
 Attempted to call patient regarding upcoming GI procedure on 08/31/23. No answer. LVM for patient to call nurse line with any questions.

## 2023-08-31 NOTE — Progress Notes (Unsigned)
 Cardiology Office Note  Date:  09/01/2023   ID:  [name] [name], DOB 22-Jul-1965, MRN [id number]  PCP:  Patient, No Pcp Per   Chief Complaint  Patient presents with   New Patient (Initial Visit)    Ref by Dr. Balan for elevated calcium  score. Doing well.     HPI:  [name] [name] a 58 y.o. malewith past medical history of: Diabetes type 2 Restrictive lung disease Hodgkin's lymphoma with relapse Bone marrow transplant x2 CRI Who presents by referral from Dr. Tommas for consultation of his CAD, elevated calcium  score, SOB  Seen by endocrinology, Dr. Tommas Not on medications for cholesterol, screening done  CT calcium  score July 06, 2023 Images pulled up and reviewed Coronary calcium  score of 1895  Three-vessel coronary artery calcification Mild aortic valve calcium  Mild aortic atherosclerosis  Gets SOB/chest tight on exertion, no regular exercise program, avoids the heat Works from home, sedentary  Lab work May 2025 Total cholesterol 196 LDL 106 A1c 8.0  EKG personally reviewed by myself on todays visit  EKG Interpretation Date/Time:  Tuesday September 01 2023 09:14:06 EDT Ventricular Rate:  72 PR Interval:  140 QRS Duration:  76 QT Interval:  384 QTC Calculation: 420 R Axis:   -9  Text Interpretation: Normal sinus rhythm When compared with ECG of 19-Jul-2017 07:01, No significant change was found Confirmed by Perla Lye [phone]) on 09/01/2023 9:16:37 AM     PMH:   has a past medical history of Cancer (HCC) (2015), Depression, Diabetes mellitus without complication (HCC), Encounter for antineoplastic chemotherapy (03/20/2015), GVHD (graft-versus-host disease) complicating bone marrow transplant (HCC), Headache(784.0), Hepatic artery aneurysm (HCC) (2015), Hodgkin lymphoma (HCC) (2015), Hypertension, Pain, dental (02/12/2015), and Thyroid disease.  PSH:    Past Surgical History:  Procedure Laterality Date   IR GENERIC HISTORICAL  01/01/2016   IR RADIOLOGIST  EVAL & MGMT 01/01/2016 Wilkie Lent, MD [facility] INTERV RAD   IR RADIOLOGIST EVAL & MGMT  12/24/2017   IR RADIOLOGIST EVAL & MGMT  02/07/2020   MASS EXCISION Left 12/31/2012   Procedure: CERVICAL LYMPH NODE BIOPSY;  Surgeon: Lynda Leos, MD;  Location: [facility] OR;  Service: General;  Laterality: Left;   TONSILLECTOMY      Current Outpatient Medications  Medication Sig Dispense Refill   Azelastine -Fluticasone  137-50 MCG/ACT SUSP Place 1 spray into the nose 2 (two) times daily.     benzonatate (TESSALON) 100 MG capsule Take 100 mg by mouth 3 (three) times daily as needed.     Carboxymethylcellulose Sod PF (THERATEARS) 1 % GEL 1 drop as needed.     Cholecalciferol (D 1000) 1000 units capsule Take 1,000 mg by mouth daily.     esomeprazole (NEXIUM) 40 MG capsule Take 40 mg by mouth daily at 12 noon.     eszopiclone (LUNESTA) 2 MG TABS tablet Take 2 mg by mouth at bedtime as needed for sleep. Reported on 04/05/2015     Ferrous Sulfate (IRON PO) Take 1 tablet by mouth daily.     insulin  aspart (NOVOLOG ) 100 UNIT/ML injection INJECT VIA PUMP UPTO 100 UNITS PER DAY FOR 30 DAYS     levothyroxine  (SYNTHROID ) 137 MCG tablet Take 274 mcg by mouth daily.     lidocaine -prilocaine  (EMLA ) cream Apply 1 application topically as needed. 30 g 2   MAGNESIUM PO Take 250 mg by mouth in the morning and at bedtime.     metFORMIN  (GLUCOPHAGE ) 500 MG tablet Take 500 mg by mouth 2 (two) times daily. Reported on  07/06/2015     montelukast  (SINGULAIR ) 10 MG tablet Take 10 mg by mouth daily.     omega-3 acid ethyl esters (LOVAZA ) 1 g capsule TAKE 2 CAPSULES BY MOUTH TWICE A DAY 120 capsule 0   Pancrelipase, Lip-Prot-Amyl, (CREON) 24000-76000 units CPEP Take 3 capsules by mouth 3 times a day with meals and 2 capsules 2 times a day with snacks     REZUROCK 200 MG TABS Take 200 mg by mouth daily.     ruxolitinib phosphate (JAKAFI) 10 MG tablet Take 20 mg by mouth 2 (two) times daily.     sertraline  (ZOLOFT ) 100 MG tablet  Take 100 mg by mouth daily with breakfast.      sodium fluoride  (PREVIDENT 5000 PLUS) 1.1 % CREA dental cream Apply fluoride  to tooth brush. Brush teeth for 2 minutes. Spit out excess-DO NOT swallow. Repeat nightly. 1 Tube prn   Sulfamethoxazole-Trimethoprim (SULFAMETHOXAZOLE-TMP DS PO) Take 1 tablet by mouth daily.     tacrolimus (PROGRAF) 1 MG capsule Take 1 mg by mouth 2 (two) times daily.     TRELEGY ELLIPTA 200-62.5-25 MCG/ACT AEPB Inhale 1 puff into the lungs daily.     TYRVAYA 0.03 MG/ACT SOLN Place 0.03 mg into the nose daily.     valACYclovir (VALTREX) 500 MG tablet Take 500 mg by mouth 2 (two) times daily.     insulin  aspart (NOVOLOG ) 100 UNIT/ML injection Inject 100 Units into the skin as needed for high blood sugar.     Isavuconazonium Sulfate (CRESEMBA) 186 MG CAPS Take 2 capsules by mouth daily. (Patient not taking: Reported on 09/01/2023)     oxyCODONE  (OXY IR/ROXICODONE ) 5 MG immediate release tablet Take 1 tablet (5 mg total) by mouth every 6 (six) hours as needed for severe pain. (Patient not taking: Reported on 09/01/2023) 60 tablet 0   No current facility-administered medications for this visit.     Allergies:   Patient has no known allergies.   Social History:  The patient  reports that he quit smoking about 11 years ago. His smoking use included cigarettes. He started smoking about 42 years ago. He has a 31 pack-year smoking history. He has never used smokeless tobacco. He reports that he does not drink alcohol and does not use drugs.   Family History:   family history includes AAA (abdominal aortic aneurysm) in his mother; Aneurysm (age of onset: 66) in his mother; Bone cancer (age of onset: 76) in his maternal uncle; Cancer in his brother; Diabetes in his father; Heart disease in his mother; Hodgkin's lymphoma in his father; Obesity in his mother.    Review of Systems: Review of Systems  Constitutional: Negative.   HENT: Negative.    Respiratory:  Positive for  shortness of breath.   Cardiovascular:  Positive for chest pain.  Gastrointestinal: Negative.   Musculoskeletal: Negative.   Neurological: Negative.   Psychiatric/Behavioral: Negative.    All other systems reviewed and are negative.   PHYSICAL EXAM: VS:  BP 110/76 (BP Location: Right Arm, Patient Position: Sitting, Cuff Size: Normal)   Pulse 72   Ht 5' 9 (1.753 m)   Wt 182 lb 8 oz (82.8 kg)   SpO2 94%   BMI 26.95 kg/m  , BMI Body mass index is 26.95 kg/m. GEN: Well nourished, well developed, in no acute distress HEENT: normal Neck: no JVD, carotid bruits, or masses Cardiac: RRR; no murmurs, rubs, or gallops,no edema  Respiratory:  clear to auscultation bilaterally, normal work of breathing GI:  soft, nontender, nondistended, + BS MS: no deformity or atrophy Skin: warm and dry, no rash Neuro:  Strength and sensation are intact Psych: euthymic mood, full affect  Recent Labs: No results found for requested labs within last 365 days.    Lipid Panel Lab Results  Component Value Date   CHOL 198 04/26/2015   HDL 28 (L) 04/26/2015   LDLCALC NOT CALC 04/26/2015   TRIG 635 (H) 04/26/2015      Wt Readings from Last 3 Encounters:  09/01/23 182 lb 8 oz (82.8 kg)  12/24/17 162 lb (73.5 kg)  07/19/17 170 lb (77.1 kg)       ASSESSMENT AND PLAN:  Problem List Items Addressed This Visit       Cardiology Problems   CAD (coronary artery disease) - Primary   Relevant Orders   EKG 12-Lead (Completed)   Coronary artery disease with stable angina Significant coronary calcification noted on CT scan, images pulled up and reviewed Three-vessel CAD, heavy Sedentary lifestyle, somewhat limited by shortness of breath, tightness in the chest on exertion, unable to exclude angina - Recommend Lexiscan Myoview to rule out ischemia Stressed the importance of aggressive diabetes control, cholesterol control  Hyperlipidemia: Recommend he start Crestor  10 with Zetia  10 daily Goal LDL  less than 55 total cholesterol less than 140 Recheck lipids in 2-3 months  Diabetes type 2 with complications Working with endocrinology, stressed importance of aggressive diet control  Hodgkin's lymphoma, bone marrow transplant x2 Followed by [facility]  Patient was seen in consultation by Dr. Ballin and will be referred back to her office for ongoing care of the issues detailed above  Signed, Velinda Lunger, M.D., Ph.D. [facility] [place], [place] [phone]

## 2023-09-01 ENCOUNTER — Encounter: Payer: Self-pay | Admitting: Cardiovascular Disease

## 2023-09-01 ENCOUNTER — Ambulatory Visit: Attending: Cardiovascular Disease | Admitting: Cardiovascular Disease

## 2023-09-01 VITALS — BP 110/76 | HR 72 | Ht 69.0 in | Wt 182.5 lb

## 2023-09-01 DIAGNOSIS — R0600 Dyspnea, unspecified: Secondary | ICD-10-CM | POA: Diagnosis not present

## 2023-09-01 DIAGNOSIS — E782 Mixed hyperlipidemia: Secondary | ICD-10-CM | POA: Diagnosis not present

## 2023-09-01 DIAGNOSIS — I25118 Atherosclerotic heart disease of native coronary artery with other forms of angina pectoris: Secondary | ICD-10-CM | POA: Diagnosis not present

## 2023-09-01 DIAGNOSIS — R079 Chest pain, unspecified: Secondary | ICD-10-CM

## 2023-09-01 MED ORDER — EZETIMIBE 10 MG PO TABS: 10.0000 mg | ORAL_TABLET | Freq: Every day | ORAL | 3 refills | Status: AC

## 2023-09-01 MED ORDER — ROSUVASTATIN CALCIUM 10 MG PO TABS: 10.0000 mg | ORAL_TABLET | Freq: Every day | ORAL | 3 refills | Status: AC

## 2023-09-01 NOTE — Patient Instructions (Addendum)
 Medication Instructions:   Please start crestor  10 mg daily with zetia  10 mg daily  If you need a refill on your cardiac medications before your next appointment, please call your pharmacy.   Lab work: No new labs needed  Testing/Procedures:  Your provider has ordered a Lexiscan/ Exercise Myoview Stress test. This will take place at [facility]. Please report to the [facility] medical mall entrance. The volunteers at the first desk will direct you where to go.  [facility] MYOVIEW  Your provider has ordered a Stress Test with nuclear imaging. The purpose of this test is to evaluate the blood supply to your heart muscle. This procedure is referred to as a Non-Invasive Stress Test. This is because other than having an IV started in your vein, nothing is inserted or invades your body. Cardiac stress tests are done to find areas of poor blood flow to the heart by determining the extent of coronary artery disease (CAD). Some patients exercise on a treadmill, which naturally increases the blood flow to your heart, while others who are unable to walk on a treadmill due to physical limitations will have a pharmacologic/chemical stress agent called Lexiscan . This medicine will mimic walking on a treadmill by temporarily increasing your coronary blood flow.   Please note: these test may take anywhere between 2-4 hours to complete  How to prepare for your Myoview test:  Nothing to eat for 6 hours prior to the test No caffeine for 24 hours prior to test No smoking 24 hours prior to test. Your medication may be taken with water.  If your doctor stopped a medication because of this test, do not take that medication. Ladies, please do not wear dresses.  Skirts or pants are appropriate. Please wear a short sleeve shirt. No perfume, cologne or lotion. Wear comfortable walking shoes. No heels!   PLEASE NOTIFY THE OFFICE AT LEAST 24 HOURS IN ADVANCE IF YOU ARE UNABLE TO KEEP YOUR APPOINTMENT.  [phone] AND  PLEASE  NOTIFY NUCLEAR MEDICINE AT [facility] AT LEAST 24 HOURS IN ADVANCE IF YOU ARE UNABLE TO KEEP YOUR APPOINTMENT. [phone]   Follow-Up: At [facility], you and your health needs are our priority.  As part of our continuing mission to provide you with exceptional heart care, we have created designated Provider Care Teams.  These Care Teams include your primary Cardiologist (physician) and Advanced Practice Providers (APPs -  Physician Assistants and Nurse Practitioners) who all work together to provide you with the care you need, when you need it.  You will need a follow up appointment in 12 months  Providers on your designated Care Team:   Lonni Meager, NP Bernardino Bring, PA-C Cadence [place], [place]  COVID-19 Vaccine Information can be found at: [email or web address].nl For questions related to vaccine distribution or appointments, please email [email or web address] or call [phone].

## 2023-09-04 MED ORDER — ESZOPICLONE 2 MG TABLET
ORAL_TABLET | 0 refills | 0.00000 days
Start: 2023-09-04 — End: ?

## 2023-09-09 DIAGNOSIS — C8190 Hodgkin lymphoma, unspecified, unspecified site: Principal | ICD-10-CM

## 2023-09-09 NOTE — Unmapped (Signed)
 The [facility] has made a second and final attempt to reach this patient to refill the following medication:Rezurock .      We have been unable to leave messages on the following phone numbers: [phone], have sent a MyChart message, have sent a text message to the following phone numbers: [phone], and have sent a Mychart questionnaire..    Dates contacted: 8/18n 8/27  Last scheduled delivery: shipped 7/8; 7/28 patient reported he just opened a bottle and requested we follow up next month    The patient may be at risk of non-compliance with this medication. The patient should call the [facility] at [phone]  Option 4, then Option 1: Oncology to refill medication.    Dorothyann JAYSON Lands   [facility] Specialty and Home Delivery Oncologist

## 2023-09-16 DIAGNOSIS — C8190 Hodgkin lymphoma, unspecified, unspecified site: Principal | ICD-10-CM

## 2023-09-16 MED ORDER — ESZOPICLONE 2 MG TABLET
ORAL_TABLET | ORAL | 0 refills | 0.00000 days | Status: CP
Start: 2023-09-16 — End: ?

## 2023-09-16 NOTE — Unmapped (Addendum)
 [facility] Specialty and Home Delivery Pharmacy Refill Coordination Note    Specialty Medication(s) to be Shipped:   Hematology/Oncology: Rezurock  200mg     Other medication(s) to be shipped: No additional medications requested for fill at this time    Specialty Medications not needed at this time: N/A     Zachary Mathis, DOB: 05-Feb-1965  Phone: [phone] (home)       All above HIPAA information was verified with patient.     Was a Nurse, learning disability used for this call? No    Completed refill call assessment today to schedule patient's medication shipment from the [facility] and Home Delivery Pharmacy  [phone]).  All relevant notes have been reviewed.     Specialty medication(s) and dose(s) confirmed: Regimen is correct and unchanged.   Changes to medications: [name] reports no changes at this time.  Changes to insurance: No  New side effects reported not previously addressed with a pharmacist or physician: None reported  Questions for the pharmacist: No    Confirmed patient received a Conservation officer, historic buildings and a Surveyor, mining with first shipment. The patient will receive a drug information handout for each medication shipped and additional FDA Medication Guides as required.       DISEASE/MEDICATION-SPECIFIC INFORMATION        N/A    SPECIALTY MEDICATION ADHERENCE     Medication Adherence    Patient reported X missed doses in the last month: 4  Specialty Medication: Rezurock  200mg               Were doses missed due to medication being on hold? No    Rezurock  200mg    : 7 days of medicine on hand       REFERRAL TO PHARMACIST     Referral to the pharmacist: Yes - high priority compliance concerns. Patient has missed more than 3 doses of medication. Refills were scheduled and concern routed (high priority) to pharmacist for evaluation.      SHIPPING     Shipping address confirmed in Epic.     Cost and Payment: Patient has a $0 copay, payment information is not required.    Delivery Scheduled: Yes, Expected medication delivery date: 9/5.     Medication will be delivered via UPS to the prescription address in Epic WAM.    Zachary Mathis   [facility] Specialty and Home Delivery Pharmacy  Specialty Technician

## 2023-09-17 NOTE — Unmapped (Signed)
 Spoke with Zachary Mathis regarding upcoming GI procedure on 09/18/23 at [facility] location at 0900 with an arrival time of 0800.     Informed patient prep instructions were sent via MyChart on 09/16/23.  Confirmed instructions were received, reviewed and understood. No questions at this time. Number provided for future questions if they arise.    Verified prep medications ordered/received.    Reviewed information regarding holding metformin  day of procedure (until after procedure),  adjusting insulin  pump to basal rate on prep and procedure day as well as holding vitamins/supplements/oil based items/iron containing products the day before and day of procedure. Advised to review medication list in instructions. All other approved medications should be taken at least two hours prior to appointment time.      Reviewed low fiber diet should have started three days prior to procedure.    Reviewed LIQUID diet requirement the entire day prior to procedure and NOTHING AT ALL 2 hours prior to procedure    Reviewed driver requirement (18 or older, must be able to drive patient home, NO UBER OR LYFT FOR PICK-UP, MUST SIGN DISCHARGE PAPERS, must remain within 20 minutes of facility and reachable by cell phone).    No further questions at this time.

## 2023-09-17 NOTE — Unmapped (Signed)
 This pharmacist was notified by a technician that this patient has reported that they've missed 4 doses of their Rezuro[place] .. I have reviewed the patient's medical record and have determined that no further pharmacist action is needed.      Approximate time spent: 0-5 minutes    Calani Gick CHRISTELLA Molt, PharmD, Clinical Specialty Pharmacist  [facility]

## 2023-09-18 ENCOUNTER — Inpatient Hospital Stay: Admit: 2023-09-18 | Discharge: 2023-09-18 | Payer: BLUE CROSS/BLUE SHIELD

## 2023-09-18 ENCOUNTER — Encounter
Admit: 2023-09-18 | Discharge: 2023-09-18 | Payer: PRIVATE HEALTH INSURANCE | Attending: Anesthesiology | Primary: Anesthesiology

## 2023-09-18 MED ADMIN — Propofol (DIPRIVAN) injection: INTRAVENOUS | @ 14:00:00 | Stop: 2023-09-18

## 2023-09-18 MED ADMIN — lidocaine (PF) (XYLOCAINE-MPF) 20 mg/mL (2 %) injection: INTRAVENOUS | @ 14:00:00 | Stop: 2023-09-18

## 2023-09-18 MED ADMIN — heparin, porcine (PF) 100 unit/mL injection 500 Units: 500 [IU] | INTRAVENOUS | @ 15:00:00 | Stop: 2023-09-18

## 2023-09-18 MED ADMIN — propofol (DIPRIVAN) infusion 10 mg/mL: INTRAVENOUS | @ 14:00:00 | Stop: 2023-09-18

## 2023-09-18 MED ADMIN — dexmedeTOMIDine (Precedex) injection: INTRAVENOUS | @ 14:00:00 | Stop: 2023-09-18

## 2023-09-18 MED ADMIN — sodium chloride (NS) 0.9 % infusion: 10 mL/h | INTRAVENOUS | @ 13:00:00 | Stop: 2023-09-18

## 2023-09-21 ENCOUNTER — Other Ambulatory Visit: Payer: Self-pay | Admitting: Physician Assistant

## 2023-09-21 ENCOUNTER — Ambulatory Visit
Admission: RE | Admit: 2023-09-21 | Discharge: 2023-09-21 | Disposition: A | Discharge status: Home / Self Care | Source: Ambulatory Visit | Attending: Cardiovascular Disease | Admitting: Cardiovascular Disease

## 2023-09-21 DIAGNOSIS — R079 Chest pain, unspecified: Secondary | ICD-10-CM | POA: Insufficient documentation

## 2023-09-21 DIAGNOSIS — I25118 Atherosclerotic heart disease of native coronary artery with other forms of angina pectoris: Secondary | ICD-10-CM | POA: Diagnosis not present

## 2023-09-21 DIAGNOSIS — R0600 Dyspnea, unspecified: Secondary | ICD-10-CM | POA: Diagnosis present

## 2023-09-21 LAB — NM MYOCAR MULTI W/SPECT W/WALL MOTION / EF
LV dias vol: 100 mL (ref 62–150)
LV sys vol: 42 mL (ref 4.2–5.8)
MPHR: 86 {beats}/min
Nuc Stress EF: 58 %
Peak HR: 86 {beats}/min
Percent HR: 52 %
Rest HR: 67 {beats}/min
Rest Nuclear Isotope Dose: 10.3 mCi
SDS: 3
SRS: 6
SSS: 1
ST Depression (mm): 0 mm
Stress Nuclear Isotope Dose: 28.4 mCi
TID: 1.08

## 2023-09-21 MED ORDER — TECHNETIUM TC 99M TETROFOSMIN IV KIT
28.4100 | PACK | Freq: Once | INTRAVENOUS | Status: AC | PRN
  Administered 2023-09-21: 28.41 via INTRAVENOUS

## 2023-09-21 MED ORDER — TECHNETIUM TC 99M TETROFOSMIN IV KIT
10.7400 | PACK | Freq: Once | INTRAVENOUS | Status: AC | PRN
  Administered 2023-09-21: 10.25 via INTRAVENOUS

## 2023-09-21 MED ORDER — REGADENOSON 0.4 MG/5ML IV SOLN
0.4000 mg | Freq: Once | INTRAVENOUS | Status: AC
  Administered 2023-09-21: 0.4 mg via INTRAVENOUS

## 2023-09-21 NOTE — Progress Notes (Signed)
     [name] [name] presented for a nuclear stress test today.  I Lesley LITTIE Maffucci, PA-C, provided direct supervision and was present during the stress portion of the study today, which was completed without significant symptoms, immediate complications, or acute ST/T changes on ECG.  Stress imaging is pending at this time.  Preliminary ECG findings may be listed in the chart, but the stress test result will not be finalized until perfusion imaging is complete.  Lesley LITTIE Maffucci, PA-C  09/21/2023, 10:12 AM

## 2023-09-27 ENCOUNTER — Ambulatory Visit: Payer: Self-pay | Admitting: Cardiovascular Disease

## 2023-09-28 ENCOUNTER — Encounter: Payer: Self-pay | Admitting: Emergency Medicine

## 2023-10-14 NOTE — Addendum Note (Signed)
 Encounter addended by: Gladis Burnard SAILOR on: 10/14/2023 9:04 AM  Actions taken: Imaging Exam ended, Charge Capture section accepted

## 2023-10-14 NOTE — Addendum Note (Signed)
 Encounter addended by: Gladis Burnard SAILOR on: 10/14/2023 11:05 AM  Actions taken: Imaging Exam ended

## 2023-10-16 MED ORDER — FLUOROMETHOLONE 0.1 % EYE DROPS,SUSPENSION
Freq: Three times a day (TID) | OPHTHALMIC | 0 refills | 0.00000 days | Status: CP
Start: 2023-10-16 — End: ?

## 2023-10-16 MED ORDER — NEOMYCIN 3.5 MG/G-POLYMYXIN B 10,000 UNIT/G-DEXAMETH 0.1 % EYE OINT
Freq: Every evening | OPHTHALMIC | 1 refills | 0.00000 days | Status: CP
Start: 2023-10-16 — End: ?

## 2023-10-19 ENCOUNTER — Ambulatory Visit
Admit: 2023-10-19 | Discharge: 2023-10-20 | Payer: MEDICAID | Attending: Hematology & Oncology | Primary: Hematology & Oncology

## 2023-10-19 ENCOUNTER — Other Ambulatory Visit: Admit: 2023-10-19 | Discharge: 2023-10-20 | Payer: MEDICAID

## 2023-10-19 DIAGNOSIS — C819A Hodgkin's disease in remission, unspecified Hodgkin lymphoma type (CMS-HCC): Principal | ICD-10-CM

## 2023-10-19 DIAGNOSIS — H16223 Keratoconjunctivitis sicca, not specified as Sjogren's, bilateral: Principal | ICD-10-CM

## 2023-10-19 DIAGNOSIS — C8190 Hodgkin lymphoma, unspecified, unspecified site: Principal | ICD-10-CM

## 2023-10-19 DIAGNOSIS — Z9481 Bone marrow transplant status: Principal | ICD-10-CM

## 2023-10-19 DIAGNOSIS — K219 Gastro-esophageal reflux disease without esophagitis: Principal | ICD-10-CM

## 2023-10-19 DIAGNOSIS — R1310 Dysphagia, unspecified: Principal | ICD-10-CM

## 2023-10-19 DIAGNOSIS — E039 Hypothyroidism, unspecified: Principal | ICD-10-CM

## 2023-10-19 DIAGNOSIS — Z9484 Stem cells transplant status: Principal | ICD-10-CM

## 2023-10-19 DIAGNOSIS — D849 Immunodeficiency, unspecified: Principal | ICD-10-CM

## 2023-10-19 DIAGNOSIS — D89811 Chronic graft-versus-host disease: Principal | ICD-10-CM

## 2023-10-19 DIAGNOSIS — K8689 Other specified diseases of pancreas: Principal | ICD-10-CM

## 2023-10-19 DIAGNOSIS — J984 Other disorders of lung: Principal | ICD-10-CM

## 2023-10-19 LAB — HEPATIC FUNCTION PANEL
ALBUMIN: 3.7 g/dL (ref 3.4–5.0)
ALKALINE PHOSPHATASE: 270 U/L — ABNORMAL HIGH (ref 46–116)
ALT (SGPT): 96 U/L — ABNORMAL HIGH (ref 10–49)
AST (SGOT): 87 U/L — ABNORMAL HIGH (ref <=34)
BILIRUBIN DIRECT: 0.2 mg/dL (ref 0.00–0.30)
BILIRUBIN TOTAL: 0.5 mg/dL (ref 0.3–1.2)
PROTEIN TOTAL: 7.4 g/dL (ref 5.7–8.2)

## 2023-10-19 LAB — COMPREHENSIVE METABOLIC PANEL
ALBUMIN: 3.7 g/dL (ref 3.4–5.0)
ALKALINE PHOSPHATASE: 270 U/L — ABNORMAL HIGH (ref 46–116)
ALT (SGPT): 96 U/L — ABNORMAL HIGH (ref 10–49)
ANION GAP: 10 mmol/L (ref 5–14)
AST (SGOT): 87 U/L — ABNORMAL HIGH (ref <=34)
BILIRUBIN TOTAL: 0.5 mg/dL (ref 0.3–1.2)
BLOOD UREA NITROGEN: 16 mg/dL (ref 9–23)
BUN / CREAT RATIO: 10
CALCIUM: 9.2 mg/dL (ref 8.7–10.4)
CHLORIDE: 98 mmol/L (ref 98–107)
CO2: 32 mmol/L — ABNORMAL HIGH (ref 20.0–31.0)
CREATININE: 1.53 mg/dL — ABNORMAL HIGH (ref 0.73–1.18)
EGFR CKD-EPI (2021) MALE: 53 mL/min/1.73m2 — ABNORMAL LOW (ref >=60)
GLUCOSE RANDOM: 116 mg/dL (ref 70–179)
POTASSIUM: 3.9 mmol/L (ref 3.4–4.8)
PROTEIN TOTAL: 7.4 g/dL (ref 5.7–8.2)
SODIUM: 140 mmol/L (ref 135–145)

## 2023-10-19 LAB — CBC W/ AUTO DIFF
BASOPHILS ABSOLUTE COUNT: 0.1 10*9/L (ref 0.0–0.1)
BASOPHILS RELATIVE PERCENT: 0.9 %
EOSINOPHILS ABSOLUTE COUNT: 0.1 10*9/L (ref 0.0–0.5)
EOSINOPHILS RELATIVE PERCENT: 1.3 %
HEMATOCRIT: 44.5 % (ref 39.0–48.0)
HEMOGLOBIN: 15.4 g/dL (ref 12.9–16.5)
LYMPHOCYTES ABSOLUTE COUNT: 2.3 10*9/L (ref 1.1–3.6)
LYMPHOCYTES RELATIVE PERCENT: 28.7 %
MEAN CORPUSCULAR HEMOGLOBIN CONC: 34.6 g/dL (ref 32.0–36.0)
MEAN CORPUSCULAR HEMOGLOBIN: 35.8 pg — ABNORMAL HIGH (ref 25.9–32.4)
MEAN CORPUSCULAR VOLUME: 103.5 fL — ABNORMAL HIGH (ref 77.6–95.7)
MEAN PLATELET VOLUME: 7.8 fL (ref 6.8–10.7)
MONOCYTES ABSOLUTE COUNT: 0.7 10*9/L (ref 0.3–0.8)
MONOCYTES RELATIVE PERCENT: 9 %
NEUTROPHILS ABSOLUTE COUNT: 4.9 10*9/L (ref 1.8–7.8)
NEUTROPHILS RELATIVE PERCENT: 60.1 %
PLATELET COUNT: 271 10*9/L (ref 150–450)
RED BLOOD CELL COUNT: 4.3 10*12/L (ref 4.26–5.60)
RED CELL DISTRIBUTION WIDTH: 14.3 % (ref 12.2–15.2)
WBC ADJUSTED: 8.1 10*9/L (ref 3.6–11.2)

## 2023-10-19 LAB — TACROLIMUS LEVEL, TROUGH: TACROLIMUS, TROUGH: 6.5 ng/mL (ref 5.0–15.0)

## 2023-10-19 LAB — BILIRUBIN, DIRECT: BILIRUBIN DIRECT: 0.2 mg/dL (ref 0.00–0.30)

## 2023-10-19 MED ORDER — AZITHROMYCIN 250 MG TABLET
ORAL_TABLET | Freq: Every day | ORAL | 3 refills | 90.00000 days | Status: CP
Start: 2023-10-19 — End: 2024-10-18

## 2023-10-19 MED ORDER — TRELEGY ELLIPTA 200 MCG-62.5 MCG-25 MCG POWDER FOR INHALATION
Freq: Every day | RESPIRATORY_TRACT | 11 refills | 28.00000 days | Status: CP
Start: 2023-10-19 — End: 2024-10-18

## 2023-10-19 MED ORDER — BENZONATATE 100 MG CAPSULE
ORAL_CAPSULE | Freq: Three times a day (TID) | ORAL | 1 refills | 90.00000 days | Status: CP | PRN
Start: 2023-10-19 — End: 2024-10-18

## 2023-10-20 DIAGNOSIS — C8190 Hodgkin lymphoma, unspecified, unspecified site: Principal | ICD-10-CM

## 2023-10-20 NOTE — Unmapped (Signed)
 [facility]    Subjective   Patient ID: Zachary Mathis is a 58 y.o. male.    Chief Complaint    Follow-up       HPI    58 y.o. 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 Reports worsening dry eye symptoms since last visit. Hard for him to work and drive because his eyes just want to close up.  Vision sometimes feels cloudy.    Eye meds:   + serum tears throughout the day (wants to know if concentration of serum tears can be increased)  + PFAT when serum tears aren't available  + fish oil  + lotemax (ran out last week)   + tyrvaya  (having issues with insurance coverage)  Last edited by Ezzard Tawni RAMAN on 10/16/2023  4:16 PM.        No current outpatient medications on file. (ANTI-ULCER PREPARATIONS)       Current Outpatient Medications (Other)   Medication Sig Dispense Refill    acyclovir  (ZOVIRAX ) 400 MG tablet Take 1 tablet (400 mg total) by mouth two (2) times a day. 180 tablet 3    azelastine-fluticasone  137-50 mcg/spray Spry 1 spray into each nostril Two (2) times a day.      azithromycin  (ZITHROMAX ) 250 MG tablet Take 1 tablet (250 mg total) by mouth daily. Take one 250mg  tablet on Mondays, Wednesdays, and Fridays (as part of FAM therapy) 90 tablet 3    belumosudil  (REZUROCK ) 200 mg Tab Take 1 tablet (200 mg) by mouth daily. 30 tablet 5    benzonatate  (TESSALON  PERLES) 100 MG capsule Take 1 capsule (100 mg total) by mouth Three (3) times a day as needed for cough. 270 capsule 1    calcium carbonate (TUMS) 200 mg calcium (500 mg) chewable tablet Chew 1 tablet (500 mg total) in the morning.      calcium carbonate-vitamin D3 600 mg (1,500 mg)-800 unit Chew Chew 1 tablet Two (2) times a day. 100 tablet 0    carboxymethylcellulose sodium (REFRESH CELLUVISC) 1 % DpGe 1 drop every four (4) hours as needed.      esomeprazole  (NEXIUM ) 40 MG capsule Take 1 capsule (40 mg total) by mouth daily before breakfast. 90 capsule 3    eszopiclone  (LUNESTA ) 2 MG Tab TAKE 1 TABLET BY MOUTH IMMEDIATELY BEFORE BEDTIME 15 tablet 0    fluorometholone  (FML) 0.1 % ophthalmic suspension Administer 1 drop to both eyes Three (3) times a day. 5 mL 0    fluticasone -umeclidin-vilanter (TRELEGY ELLIPTA ) 200-62.5-25 mcg inhaler Inhale 1 puff daily. 28 each 11    FREESTYLE LIBRE 14 DAY SENSOR kit USE AS DIRECTED EVERY 14 DAYS  4    hypromellose (SYSTANE GEL OPHT) Apply to eye.      levothyroxine  (SYNTHROID , LEVOTHROID) 300 MCG tablet Take 1 tablet (300 mcg total) by mouth in the morning.  1    metFORMIN  (GLUCOPHAGE ) 500 MG tablet Take 1 tablet (500 mg total) by mouth in the morning and 1 tablet (500 mg total) in the evening. Take with meals. 180 tablet 3    montelukast  (SINGULAIR ) 10 mg tablet Take 1 tablet (10 mg total) by mouth daily. 30 tablet 3    multivitamin therapeutic with minerals (THERA-M) 27-0.4 mg Tab Take 1 tablet by mouth in the morning.      neomycin-polymyxin-dexamethamethasone (MAXITROL) 3.5 mg/g-10,000 unit/g-0.1 % Oint Administer 1 Application to both eyes nightly. 3.5 g 1    NON FORMULARY Autologous serum tears , 100%  concentration QID for 3 months with 3 refills 15 each 0    NOVOLOG  FLEXPEN U-100 INSULIN  100 unit/mL (3 mL) injection pen INJECT 3 TO 8 UNITS 3 TIMES A DAY      ocular lubricant (RESTORE PM) 57.3-42.5 % Oint 1 Application Three (3) times a day.      omega-3 acid ethyl esters (LOVAZA) 1 gram capsule Take 2 capsules (2 g total) by mouth Two (2) times a day. 120 capsule 5    pancrelipase , Lip-Prot-Amyl, (CREON ) 24,000-76,000 -120,000 unit CpDR delayed release capsule Take 3 capsules by mouth 3 times a day with meals and 2 capsules 2 times a day with snacks 360 capsule 5    ruxolitinib  (JAKAFI ) 10 mg tablet Take 1 tablet (10 mg total) by mouth two (2) times a day. 60 tablet 6    sertraline  (ZOLOFT ) 100 MG tablet Take 1 tablet (100 mg total) by mouth daily. 90 tablet 3    sulfamethoxazole -trimethoprim  (BACTRIM  DS) 800-160 mg per tablet TAKE 1 TABLET (160 MG OF TRIMETHOPRIM  TOTAL) BY MOUTH EVERY MONDAY, WEDNESDAY, AND FRIDAY. 12 tablet 5    tacrolimus  (PROGRAF ) 1 MG capsule Take 2 capsules (2 mg total) by mouth daily AND 1 capsule (1 mg total) nightly. 90 capsule 6    TRESIBA  FLEXTOUCH U-100 100 unit/mL (3 mL) InPn INJECT 40 UNITS ONCE A DAY SUBCUTANEOUS      ursodioL  (ACTIGALL ) 300 mg capsule TAKE 1 CAPSULE BY MOUTH TWO TIMES A DAY. (Patient not taking: Reported on 09/18/2023) 180 capsule 1    varenicline  tartrate (TYRVAYA ) 0.03 mg/spray sprm 0.03 mg into each nostril two (2) times a day. 10 mL 5       Objective   Base Eye Exam       Visual Acuity (Snellen - Linear)         Right Left    Dist [place] 20/50 +1 20/30    Dist ph [place] NI NI              Tonometry ([facility], 4:20 PM)         Right Left    Pressure 9 10              Pupils         Dark Light Shape React APD    Right 4 3 Round Brisk None    Left 4 3 Round Brisk None              Visual Fields         Left Right     Full Full              Extraocular Movement         Right Left     Full Full              Neuro/Psych       Oriented x3: Yes    Mood/Affect: Normal                    Punctal Cautery - OU - Both Eyes          Laterality was right upper lid, left upper lid. Confirmed correct patient, correct procedure, correct side, patient consent during timeout. Topical anesthesia was used. Anesthetic: Proparacaine 0.5%.            Orders Placed This Encounter   Procedures    Punctal Cautery - OU - Both Eyes       Assessment/Plan:   Assessment:  58 y.o. male with past medical history of hypothyroidism, DM II, Hodgkin lymphoma s/p BMT, and depression.    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 Sable  #s/p CE/IOL left eye 02/25/2022 with Dr. Sable    plan:  - Serum tears 4x/day 100%  - PFATs 4-6 times a day  - Refresh PM at bedtime (intermittently)    - fish oil (omega 3) supplements 4g/day  - Lotemax 1-2 x day    -Tyrvaya         I discussed the above assessment and plan with the patient. He had the opportunity to ask questions, and his questions and concerns were addressed. He was reminded to call if there is any significant change or worsening in vision, or to get an evaluation, urgently if appropriate.    Emery Peri MD,FICO

## 2023-10-20 NOTE — Unmapped (Signed)
 BMTCT Routine Follow-up Clinic Note:    Patient Name: Zachary Mathis  Medical Record Number:  [id number]  Encounter Date: 10/19/2023    BMT Attending MD: Odis Specking, MD    Allogeneic SCT: 08/29/15 [10/10, CMV+, ABO matched, male]  CAR T cell infusion [[id number]]: 08/04/14  Autologous SCT: 07/18/14    HPI:   Zachary Mathis is a 58 y.o. male with a diagnosis of Hodgkins Lymphoma.  Zachary Mathis now presents for routine follow-up post allogeneic stem cell transplant. His course has been complicated by chronic GvHD involving the mucous membranes, eyes, and now lung.     He was last hospitalized after transfer from OSH [7/7 - 07/23/17] with DKA and an acute drop in hgb which were thought to be r/t GI bleeding. During his admission, he had colonoscopy and EGD which did not reveal an actively bleeding sites but revealed multiple non bleeding duodenal ulcers. His pathology was not c/w GHVD or viral cytopathic effect.        He has been seen Dr. Leonor and Dr. Gwenn for persistent diarrhea, where workup revealed a low stool elastase and he was diagnosed with pancreatic insufficiency. This has now resolved with pancreatic enzyme supplementation. His still has loose stools intermittently for which he takes Imodium prn. He has also seen Dr. Drucie with Hepatology who has prescribed ursodiol  (Dr. Drucie has now left [facility] and he has not yet established care with a new Hepatologist). He is now followed in the BMT clinic to monitor his cGVHD jeni have been active for years, now also with pulmonary involvement], immune suppression levels, viral and immune monitoring, and chimerism monitoring.     For DM, he is continuing therapy with an insulin  pump + monitoring. Followed by his endocrinologist for this.       Interval History:   Since his last visit, Zachary Mathis has had his endoscopy and colonoscopy - Endoscopy did not show any areas of inflammation and colonoscopy was significant for a tubular adenoma that was removed (no clear signs of active GvHD in the upper or lower GI tract). He also has had his tacrolimus  titrated down to 2mg  qAM and 1mg  qPM (and is taking this correctly), with level improved. His cough may be a bit improved but is still present. Appreciate the assistance of Dr. Diana in helping to optimize his medical management of pulmonary cGvHD.  Lung exam is showing improvement in air movement (with no wheezing, rhonchi, or chest tightness) compared to last year, similar to last visit.  He continues to work full time although this is becoming more stressful. Ocular cGvHD symtoms persist and are not substantially improved over prior with the use of autologous serum tears - However his symptoms also do not seem to be worsening. He is now using 100% autologous serum tears (up from 20%), and he has had his tear ducts cauterized which he reports has helped as well.   He continues to use an indoor humidifier. He is able to work but occasionally limited by having to rest his eyes for a time to recover, also able to drive mostly but occasionally unable to drive as well, somewhat unchanged. Has to wear goggles when driving a convertible due to ocular sun sensitivity. He is using his serum drops that help temporarily but then his dryness returns after a few hours - Plans to continue these. His ophthalmologist is continuing to follow him for cataract care as well - He had right eye cataract removal surgery in December 2023,  after which he recovered well and is doing well with vision 20/25 in his right eye.      Reflux symptoms had worsened and are stable from his last visit, worse with lying (and requiring him to sleep with pillows for support), without a change in diet (including no increase in spicy foods or alcohol). Also developed some persistent dysphagia, sometimes bad with pills and requiring him to cut his meat into small pieces, which prompted the endoscopy as above (no sign of GvHD). No oral, mucosal or skin symptoms, loss of joint ROM, and no other symptoms of cGvHD.    Appetite remains good and he is more active (unchanged from most recent visits).  No change to stooling.  No nausea.  His endocrinologist has switched him to a continuous insulin  pump which he is tolerating well and is controlling his blood glucose levels better.     EBV was detectable in the peripheral blood (< 100) on 09/12/19 -> ND since.      ROS:   PMH and PSH have been reviewed and there are no changes.      SocHx:  There are no pertinent changes to [facility].  The patient is residing at home and he continues to work. Has also been playing music with a group for recreation.     Hematology/Oncology History   Hodgkin lymphoma, mixed cellularity (CMS-HCC)   12/13/2012 Initial Diagnosis    Hodgkin lymphoma, mixed cellularity (RAF-HCC).  Stage at initial diagnosis: IIIB (bone marrow negative, ESR > 50, LDH 297, alb < 4.0, age 62)     12/13/2012 - 08/12/2013 Chemotherapy    6 cycles of ABVD. 1. Small sub-cm lung nodules on pre-treatment CT, not thought to represent lymphoma; 2. Pulmonary fibrosis with ABVD     12/31/2012 Biopsy    Lymph node, left supraclavicular, excision - Classical Hodgkin lymphoma, mixed cellularity subtype     01/25/2013 Biopsy    Normocellular bone marrow (60%) with trilineage hematopoiesis; negative for lymphoma     12/13/2013 -  Other    Patient discovered a palpable L supraclavicular node     01/26/2014 -  Cancer Staged    Deauville 5: Avid lymphoma in cervical, left axillary, mediastinal, left hilar, and periaortic nodes.      01/26/2014 Progression    PET/CT interpreted as lymphoma progression in the neck, chest, and abdomen/pelvis (along with hepatic steatosis and coronary artery calcifications).       02/13/2014 - 05/15/2014 Chemotherapy    Started ICE therapy in February with last cycle in May 2016. Received 4 cycles.      06/05/2014 Biopsy    Mildly hypercellular bone marrow (60%) with trilineage hematopoiesis.  No morphologic evidence of classical Hodgkin lymphoma     06/06/2014 -  Cancer Staged    PET/CT: Response to therapy of hypermetabolic adenopathy since 04/07/2016CT and 01/26/2014 PET. No residual malignant range activityidentified.2. No new sites of disease.3. Hepatic steatosis.4. Age advanced coronary artery atherosclerosis.      06/22/2014 - 06/23/2014 Chemotherapy    Stem cell mobilization with etoposide.     07/03/2014 -  Other    Stem cell collection.  CD34+DOSECOLLECT: 6.80      07/07/2014 -  Cancer Staged    CT scan: IMPRESSION: 1. Slightly decreased size of axillary, mediastinal, and retroperitoneal lymphadenopathy. 2. Hepatic steatosis.      07/18/2014 Bone Marrow Transplant    Autologus stem cell transplant     08/04/2014 Bone Marrow Transplant    CART infusion  01/30/2015 Progression    Mediastinal Relapse     02/21/2015 - 04/12/2015 Chemotherapy    3 cycles of bendamustine + brentuximab       Allergies:  Patient has no known allergies.     Medications:    Current Outpatient Medications on File Prior to Visit   Medication Sig    acyclovir  (ZOVIRAX ) 400 MG tablet Take 1 tablet (400 mg total) by mouth two (2) times a day.    azelastine-fluticasone  137-50 mcg/spray Spry 1 spray into each nostril Two (2) times a day.    belumosudil  (REZUROCK ) 200 mg Tab Take 1 tablet (200 mg) by mouth daily.    calcium carbonate (TUMS) 200 mg calcium (500 mg) chewable tablet Chew 1 tablet (500 mg total) in the morning.    calcium carbonate-vitamin D3 600 mg (1,500 mg)-800 unit Chew Chew 1 tablet Two (2) times a day.    carboxymethylcellulose sodium (REFRESH CELLUVISC) 1 % DpGe 1 drop every four (4) hours as needed.    esomeprazole  (NEXIUM ) 40 MG capsule Take 1 capsule (40 mg total) by mouth daily before breakfast.    eszopiclone  (LUNESTA ) 2 MG Tab TAKE 1 TABLET BY MOUTH IMMEDIATELY BEFORE BEDTIME    fluorometholone  (FML) 0.1 % ophthalmic suspension Administer 1 drop to both eyes Three (3) times a day.    FREESTYLE LIBRE 14 DAY SENSOR kit USE AS DIRECTED EVERY 14 DAYS hypromellose (SYSTANE GEL OPHT) Apply to eye.    levothyroxine  (SYNTHROID , LEVOTHROID) 300 MCG tablet Take 1 tablet (300 mcg total) by mouth in the morning.    metFORMIN  (GLUCOPHAGE ) 500 MG tablet Take 1 tablet (500 mg total) by mouth in the morning and 1 tablet (500 mg total) in the evening. Take with meals.    montelukast  (SINGULAIR ) 10 mg tablet Take 1 tablet (10 mg total) by mouth daily.    multivitamin therapeutic with minerals (THERA-M) 27-0.4 mg Tab Take 1 tablet by mouth in the morning.    neomycin-polymyxin-dexamethamethasone (MAXITROL) 3.5 mg/g-10,000 unit/g-0.1 % Oint Administer 1 Application to both eyes nightly.    NON FORMULARY Autologous serum tears , 100% concentration QID for 3 months with 3 refills    NOVOLOG  FLEXPEN U-100 INSULIN  100 unit/mL (3 mL) injection pen INJECT 3 TO 8 UNITS 3 TIMES A DAY    ocular lubricant (RESTORE PM) 57.3-42.5 % Oint 1 Application Three (3) times a day.    omega-3 acid ethyl esters (LOVAZA) 1 gram capsule Take 2 capsules (2 g total) by mouth Two (2) times a day.    pancrelipase , Lip-Prot-Amyl, (CREON ) 24,000-76,000 -120,000 unit CpDR delayed release capsule Take 3 capsules by mouth 3 times a day with meals and 2 capsules 2 times a day with snacks    ruxolitinib  (JAKAFI ) 10 mg tablet Take 1 tablet (10 mg total) by mouth two (2) times a day.    sertraline  (ZOLOFT ) 100 MG tablet Take 1 tablet (100 mg total) by mouth daily.    sulfamethoxazole -trimethoprim  (BACTRIM  DS) 800-160 mg per tablet TAKE 1 TABLET (160 MG OF TRIMETHOPRIM  TOTAL) BY MOUTH EVERY MONDAY, WEDNESDAY, AND FRIDAY.    tacrolimus  (PROGRAF ) 1 MG capsule Take 2 capsules (2 mg total) by mouth daily AND 1 capsule (1 mg total) nightly.    TRESIBA  FLEXTOUCH U-100 100 unit/mL (3 mL) InPn INJECT 40 UNITS ONCE A DAY SUBCUTANEOUS    ursodioL  (ACTIGALL ) 300 mg capsule TAKE 1 CAPSULE BY MOUTH TWO TIMES A DAY. (Patient not taking: Reported on 09/18/2023)    varenicline  tartrate (TYRVAYA )  0.03 mg/spray sprm 0.03 mg into each nostril two (2) times a day.     Current Facility-Administered Medications on File Prior to Visit   Medication    alteplase (ACTIVASE) 2 mg injection small catheter clearance    influenza vaccine quad (FLUARIX, FLULAVAL, FLUZONE) (6 MOS & UP) 2020-21 ADS Med    loteprednol (LOTEMAX) 0.5 % ophthalmic suspension 1 drop    pneumococcal polysacchride (23-valps) (PNEUMOVAX) 25 mcg/0.5 mL vaccine     Objective:  Vitals:    10/19/23 0917   BP: 131/82   Pulse: 72   Resp: 16   Temp: 36.1 ??C (97 ??F)   TempSrc: Temporal   SpO2: 95%   Weight: 83.3 kg (183 lb 9.6 oz)         KPS: 80%, Normal activity with effort; some signs or symptoms of disease (ECOG equivalent 1).     Chronic GVHD Assessment     Skin: BSA based features None (0), % BSA involved: No rash (0), non-BSA based features No sclerotic features (0), Other features:  NONE  Mouth: No symptoms (0) Lichen planus-like features present (Diagnostic) no  Eyes: Moderate dry eyes, partially affecting ADL, lubrication drops >3x per day/punctal plugs, WITHOUT new vision impairment (2) Ocular GvHD confirmed by opthalmologist yes  GI Tract: features None No symptoms (0)  Liver: Normal total bili and ALT/AP <3x ULN (0)  Lungs: symptom score Moderate symptoms with SOB after walking on flat ground (2), last FEV1 worsened since prior testing   Joints and Fascia: symptom score No symptoms (0) P-ROM shoulder 7, Elbow 7, Wrist/Fingers 7, Ankles 4      Genital tract: Not examined  Other features due to cGVHD: None    Overall cGVHD Score (current):  Moderate (eyes, lung)    Physical Exam:  General appearance: Middle aged WM, appears well, in no distress  Head: Normocephalic, without obvious abnormality  Eyes: +dryness, +mild erythema noted on surrounding conjunctiva on exam today (similar to prior exams), sclera white, non-incteric  Neck: no palpable adenopathy  Mouth: MMM, no ulcers, no mucoceles or lichenoid changes, several missing teeth/ plaque noted (oral exam improved from last year)  Lungs: Clear to auscultation bilaterally, no wheezing, good air movement and comfortable work of breathing  Heart: Regular rate and rhythm, S1, S2 normal, no murmur, no edema   Abdomen: soft, non-tender, normal BS  Extremities: No edema as above  Skin: No rashes  Neuro: No focal deficits     Lab Results   Component Value Date    WBC 8.1 10/19/2023    HGB 15.4 10/19/2023    HCT 44.5 10/19/2023    PLT 271 10/19/2023    NEUTROABS 4.9 10/19/2023    EOSABS 0.1 10/19/2023     Lab Results   Component Value Date    NA 140 10/19/2023    K 3.9 10/19/2023    CL 98 10/19/2023    CO2 32.0 (H) 10/19/2023    BUN 16 10/19/2023    CREATININE 1.53 (H) 10/19/2023    GLU 116 10/19/2023    CALCIUM 9.2 10/19/2023    MG 1.7 06/29/2023     Lab Results   Component Value Date    BILITOT 0.5 10/19/2023    BILITOT 0.5 10/19/2023    PROT 7.4 10/19/2023    PROT 7.4 10/19/2023    ALBUMIN 3.7 10/19/2023    ALBUMIN 3.7 10/19/2023    ALT 96 (H) 10/19/2023    ALT 96 (H) 10/19/2023    AST 87 (  H) 10/19/2023    AST 87 (H) 10/19/2023    ALKPHOS 270 (H) 10/19/2023    ALKPHOS 270 (H) 10/19/2023    LDH 175 10/03/2019     Assessment/Plan:  Disease/BMT: Complicated transplant history including auto SCT (07/18/14), CAR-T infusion (CART-T LCCC 1524 trial; 08/04/14), and lastly allogeneic HSCT (at [facility]; 08/29/15) for relapsed HD. He presented back to [facility] to re-establish care with Dr. Jerrell on D+77 s/p MUD HSCT.     ** For complete transplant note and CAR-T infusion, see clinic note from 11/14/15.     HCT-CI: Unknown   Match: MUD (10/10, CMV+, ABO matched, male)  Conditioning: Pentostatin + 600 cGy TBI (RIC)  Reported Cell Dose: 3.93 x 10^6 CD34+ cells/kg.  Post-SCT BmBx Evaluations:  11/07/15: D+60 Post HSCT BmBx [at [facility]].      ** Per outside report: TLH, no e/o non-Hodgkin B-cell lymphoproliferative disease. No increase in CD34+ CD117+ myeloblasts.  11/27/15: D+90. Normocellular [50%] w/TLH and no e/o classical HL.     02/20/16: PET/CT w/ NED.  08/05/17: PET/CT w/NED. Deauville 1.  - No further surveillance scans - Will monitor based on clinical suspicion in the setting of symptoms      Chimerism:  - Full donor chimerism in blood and bone marrow since allo HSCT.     ** Will continue monitoring - Last checked August 2024 and will plan for recheck at his next visit    Heme:  - Engrafted with normal Hgb and platelets, very mild leukocytosis intermittently improved.      GvHD:   Prophylaxis:  - MTX/Tac/. Siro stopped early due to AKI.    Tacrolimus :   - Stopped tacrolimus  11/05/16.    - Re-started 09/03/17 w/worsening liver GvHD [biopsy proven] and ocular cGVHD, now with lung GvHD as well (09/2022).   - Goal level 5-10. CTM. Current dose 2mg  qAM and 1mg  qPM    Ocular GvHD: Chronic and dry eyes are painful.      - Followed by Dr. Dineen at [facility] in Ophthalmology who placed dissolvable plugs and prescribed a regimen of artificial tears and po omega-3 FAs, now with Dr. Sable  -> symptoms improved in AM after fluorometholone  added  -  Uses lubricating eye drops in addition to serum drops (100% autologous serum tears) and ointment at night.  Also on systemic therapy with Jakafi  + Belumosudil   + Tac.  -  Continuing to follow with Ophthalmology as well       GI/Hepatic:   07/23/17: Started 1mg /kg oral prednisone  for transaminitis.   08/18/17: Liver biopsy results:    ** Histologic findings most c/w chronic graft-versus-host disease. Moderate portal/periportal fibrosis with widespread ductopenia involving 13/15 portal tracts on H&E stained section. Moderate to severe perisinusoidal fibrosis, irregularly distributed, but predominantly centrizonal.   08/20/17: Added Jakafi  due to persistent dry eye and rising transaminases in light of liver bx results - steroid refractory by definition.      ** AST peaked at 416 [08/26/17]    ** ALT peaked at 369 [08/26/17]     ** Tbili was 1.5; Alk PO4 793   09/03/17: Liver fx improved. Added back tacrolimus .  09/08/17: Taper prednisone  dose 35mg  po qd.   09/16/17: LFTs increased from last week. Increase Jakafi  to 10 mg bid [having significant SI hyperglycemia]. Tac goal 5-10.   09/30/17: LFTs improved, taper prednisone  to 25mg .    11/18/17: Taper pred to 5 mg.   07/19/19: Prednisone  now off.  Continuing jakafi  + tacrolimus .  Tac goal 5-10 as above.  09/12/19: Transaminitis has flucuated, slightly up today.  Continues on Jakafi  10mg  BID + Tac.    10/03/19: Not sure if this worsening hepatic GvHD (as eyes have been stable and other symptoms resolved).   - Saw Dr. Drucie, who has prescribed ursodiol  (continuing) and will continue to follow. Continuing belumosudil  + jakafi  + tacrolimus  dosed per levels. Elevated transaminases at low levels thought to be 2/2 FLD at this point - Continuing to monitor.  - Worsening reflux symptoms associated with dysphagia to large pills concerning for potential upper GI involvement of cGvHD - EGD unrevealing and symptoms are stable. CTM.    Pulmonary GvHD:  - 10/20/22: New cough since his 09/2022 visit: PFTs with worsening FEV1 c/w pulmonary GvHD. Appreciate assistance from Dr. Diana.  Has seen some symptomatic improvement with Trelegy (and tessalon  perls).  I have prescribed Belumosudil  200mg /d and this was started in November 2024. Continuing monteleukast as well.   - Systemic GvHD therapy at this point is tacrolimus  + jakafi  + belumosudil . LFTs mildly elevated but not increased from prior to belumosudil . Dr. Diana had wanted to add QVAR as well, but we could not get insurance approval for this.    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 Hepatic vasculature remains patent with normal flow. Coarsened, echogenic hepatic parenchyma suggestive of chronic liver disease. Gallbladder is unremarkable. No biliary ductal dilatation.     ID:   - Antiviral: Prophy valtrex  500mg  qd  - PJP: Prophy Bactrim  DS qMWF    Viral Screening:  01/16/20: CMV/ EBV negative.     Immunizations:   Covid vaccine - 04/12/19 dose #1 and 05/02/19 dose # [[employer]].      GI: Abd pain - resolved.   - Endoscopy w/several , non-bleeding duodenal ulcers.   -  Pancreatic insufficiency w/foul smelling diarrhea much improved on Creon . Encouraged continued well-balanced intake (inc. Adequate protein) and exercise to try and build muscle mass. Weight stable.  - Worsening reflux symptoms associated with dysphagia to large pills concerning for potential upper GI involvement of cGvHD - Appreciate luminal GI consultation.  - Continue nexium  40mg  daily + gaviscon after meals. Plan for EGD next month.    FEN/Renal:  Chronic Renal Insufficiency:   - Baseline post-SCT 1.8-2.1, had been better at 1.2-1.5, but then up to ~1.7 around the time of a supratherapeutic Tac level, now back to ~1.5  Reinforced the importance of adequate hydration.    CV:   Hx hyperlipidemia:  - Following for this as well with his PCP & endocrinologist    Hx cardiomyopathy:  - Per Care Everywhere: hx chemotherapy induced cardiomyopathy.   - ECHO at [facility] w/EF 50-55%.    ** PET scan from [facility], found to have age advanced coronary artery atherosclerosis.  - Was on ASA but stopped after GI bleed.      HTN:  - Normotensive on amlodipine  5mg  qd.     Pulm:   - DLCOc: 61.2%. Hx pulmonary fibrosis following ABVD therapy.   - Pulmonary nodules resolved, repeat imaging scheduled.   - Lung GvHD as above  08/10/17: PET and CT negative.    Seasonal allergic rhinitis:   - Azelastine-fluticasone  spray bid and allegra prn.     Neuro/Pain:   - No issues.     Psych:   Hx depression:   - Mood is stable on Zoloft  100mg  qd.  Insomnia:   - Lunesta  prn.       Endocrine:   Hx hypothyroidism:   - Monitoring and medication adjustments per endocrinologist, Dr. Tommas.      Hx Uncontrolled DM II:  - Followed by Dr. Bella in [place] (Endocrinology).   - Worsened by steroids, now off.   - Had been uncontrolled for months, Prior HgbA1C 9.5  - We have tapered prednisone  off as above, Being managed by Dr. Tommas. Now on insulin  pump + monitoring    Osteopenia:  - seen on Dexa 04/2020, worst T-score 2.1     Musculoskeletal:   DEXA:  11/23/15: Mildly low bone density. Started calcium per protocol. Zometa given 11/23/15 (completed course].   07/08/17: Re-started Ca w/D BID while on steroids.   09/09/17: Repeat DEXA showed osteoporosis with worsened bone density in the spine, stable in the femur.   04/16/20: DEXA with osteopenia - Would like to give denosumab - was denied.   12/31/20: Ostepenia as above   - Could not get approval for denosumab, however he did receive zometa    Summary: 64yrs post 10/10 MUD allo HSCT for relapsed HL, currently fully donor with no active e/o disease, prior treatment with autoSCT and anti-CD30 CAR-Tc (July 2016). Now has diagnosis of pulmonary cGvHD (no biopsy done; appreciate assistance from Dr. Diana in pulmonology in making the clinical diagnosis and formulating a treatment plan). Ocular cGvHD persists and is stable -> Cont. jakafi  + Tacrolimus  + belumosudil  + autologous serum tears. Also continuing ursodiol  as Dr. Drucie has prescribed. Will continue to follow with [facility] - Agree with continuing current dose of autologous serum tears     I personally spent over 50 minutes face-to-face and non-face-to-face in the care of this patient, which includes all pre, intra, and post visit time on the date of service.

## 2023-10-21 DIAGNOSIS — C8190 Hodgkin lymphoma, unspecified, unspecified site: Principal | ICD-10-CM

## 2023-10-21 NOTE — Unmapped (Signed)
 The [facility] has made a second and final attempt to reach this patient to refill the following medication: Rezurock .      We have left voicemails on the following phone numbers: [phone], have sent a MyChart message, have sent a text message to the following phone numbers: [phone], and have sent a Mychart questionnaire..    Dates contacted: 9/29, 10/8  Last scheduled delivery: shipped 9/4    The patient may be at risk of non-compliance with this medication. The patient should call the [facility] at [phone]  Option 4, then Option 1: Oncology to refill medication.    Zachary Mathis   [facility] Specialty and Home Delivery Oncologist

## 2023-10-26 ENCOUNTER — Inpatient Hospital Stay: Admit: 2023-10-26 | Discharge: 2023-10-26 | Payer: PRIVATE HEALTH INSURANCE

## 2023-10-26 ENCOUNTER — Ambulatory Visit
Admit: 2023-10-26 | Discharge: 2023-10-26 | Payer: PRIVATE HEALTH INSURANCE | Attending: Internal Medicine | Primary: Internal Medicine

## 2023-10-26 DIAGNOSIS — R1310 Dysphagia, unspecified: Principal | ICD-10-CM

## 2023-10-26 DIAGNOSIS — J984 Other disorders of lung: Principal | ICD-10-CM

## 2023-10-26 DIAGNOSIS — D89811 Chronic graft-versus-host disease: Principal | ICD-10-CM

## 2023-10-26 DIAGNOSIS — R059 Cough, unspecified type: Principal | ICD-10-CM

## 2023-10-26 DIAGNOSIS — Z9481 Bone marrow transplant status: Principal | ICD-10-CM

## 2023-10-26 NOTE — Unmapped (Signed)
 Referring Physician: Dr Jerrell    Chief Complaint: GVHD complications    HPI   58yo male with a PMH significant for hodgkins lymphoma s/p allo-SCT 08/2015 complicated by eye and liver GVHD who developed SOB this spring with a cough. The SOB was notable in the spring when he went to the beach with his family and could not keep up on walks and tired going up and down the house steps. He denies any inciting event such as an infection or an exposures. He feels it has been gradually getting worse. Some improvement with albuterol . Has a cough with it but not productive    Today  - cough improved with trelegy  - Still with GERD symptoms but improved    --> has been doing gaviscon PRN and feels it helps... not doing it scheduled    --> feels the reflux  - Still with a lot of PND symptoms    ROS   - the balance of 10 systems is negative other than noted above     PMH   - Hodgkins Lymphoma s/p allo stem cell transplant    --> diagnoised 2014 s/p ABVD, ICE    --> auto SCT 07/2014, CAR-T    --> Allo SCT 08/2015    --> GVHD in eyes, liver  - Pancreatic Insuffiencey    FH   - No lung disease in family    SH   - Never smoker  - no work exposures  - no birds    MEDS (personally reviewed in EPIC, pertinent meds noted below)     PHYSICAL EXAM   Vitals - BP 142/90 (BP Site: L Arm, BP Position: Sitting)  - Pulse 91  - Ht 174.7 cm (5' 8.78)  - Wt 83.5 kg (184 lb)  - SpO2 98%  - BMI 27.35 kg/m??   Gen - awake, alert, in NAD   Derm - no rash   HEENT - no adenopathy, TMs clear   Vascular - good pulses throughout, no JVP   CV - RRR,no murmers or gallops   Pulm - CTA bilaterally   Abd - soft, NT, ND, +BS, no hepatosplenomegally   Ext - no edema, no clubbing   Joints - no enlarged joints, no warmth, redness or effusions   Neuro - CN grossly intact, nl gait     RESULTS     Labs (reviewd in EPIC, pertinent values noted below)       PFTs (personally reveiewed and interpreted)     Date: FVC (% Pred) FEV1 (% Pred) FEF25-75(% Pred) DLCO TBBx/Results 10/26/2023  3.45  1.72   72%     04/27/23  3.67  1.80   78%     12/01/22  3.74  1.74   63%  Rezorouck start    09/09/22  3.62 (83%)  1.76 (52%)    Ratio <LLN, no BD response                       6 Minute Walk   - walked 484m  - lowest sat was 95% on RA    CT Chest (personally reviewed and interpretted)   - Airway inflammation with no interstitial lung disease or bronchiectasis         A/P   58yo male with a PMH significant for hodgkins lymphoma s/p allo-SCT 08/2015 complicated by eye and liver GVHD with new obstructive lung disease    Obstructive lung disease  - HRCT scan consistent with  airway inflammation and gas trapping  - PFTs with moderate obstructive lung disease with no BD response... FVC slowly declining rather than FEV1... 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 unable to get insurance approval    --> will continue azithro    --> will continue singulair     --> agree with prograf     --> agree with jakafi     --> Started rezourock 200mg  arounf November 2024 so about 12 months ago    --> if repeat HRCT scan is worrisome for progression, will discuss with Dr Milissa regarding Axatilimab    GERD/ERD  - he does note some aspiration symptoms  - modified barium swallow OK  - barium swallow with dysmotility    --> will get pH/Mano, not done  - EGD OK  - behavioral modifications discussed  - gaviscon liquid rather than tablets, advised him to schedule it after melas and prior to bed  - nexium     PND  - advised neti pot    PCP Proph:  - bactrim     Bone Health  - Will defer to PCP    Vaccines  - Influenza: 10/2023  - RSV: not eligible  - TDAP: 12/2016  - Prevnar 20: 04/2022  - Shinrix: complete 12/2016  - COVID: 10/2023    Complex medical decision making was done as we adjust medications based on blood work/drug levels and multiple complaints and problems were addresed

## 2023-10-26 NOTE — Unmapped (Addendum)
-   follow-up as instructed    Please start doing a full neti pot in each nostril twice a day.  - please use sterile water and add one saline packet and a few drops of tear free baby shampoo    - for questions please reach out to my nurse Liesl:    --> Phone: [phone]    --> Fax: [phone]

## 2023-10-29 NOTE — Unmapped (Signed)
 [facility] Mathis has been contacted in regards to their refill of Rezurock . At this time, they have declined refill due to patient having +14 doses remaining. Refill assessment call date has been updated per the patient's request.    Patient admits to missing doses

## 2023-11-02 ENCOUNTER — Inpatient Hospital Stay: Admit: 2023-11-02 | Discharge: 2023-11-02 | Payer: PRIVATE HEALTH INSURANCE

## 2023-11-12 DIAGNOSIS — D89811 Chronic graft-versus-host disease: Principal | ICD-10-CM

## 2023-11-13 NOTE — Progress Notes (Signed)
 Cellular Therapy Research Note  Date: 11-13-2023    The IRB approved letter for [facility] [id number] was sent to the patient today to notify patients of updated risks as the risk may take place after patient's have completed treatment. These updated risks are:      Side Effects of the ATLCAR.CD30 T Cells:  Rare (occurring in less than 3 out of 100 patients)  A second type of cancer        He is aware to contact me with any questions. At the time of sending this letter the study team has no knowledge of any change to patient status and has the understanding that the patient is still an active participant in the trial.       Catarina Silversmith  Clinical Immunotherapy Research Coordinator

## 2023-11-19 DIAGNOSIS — R911 Solitary pulmonary nodule: Principal | ICD-10-CM

## 2023-11-19 NOTE — Telephone Encounter (Signed)
 CT scan with 2 new RLL nodules, one 1.5cm    Discussed options with patient including repeat CT scan in 28mo, PET CT scan now followed by bronch/biopsy or go right to biopsy.    He agreed with starting with a PET CT scan

## 2023-11-24 DIAGNOSIS — C8190 Hodgkin lymphoma, unspecified, unspecified site: Principal | ICD-10-CM

## 2023-11-24 NOTE — Progress Notes (Signed)
 [facility] Mathis has been contacted in regards to their refill of Rezurock . At this time, they have declined refill due to patient having 10 doses remaining. Refill assessment call date has been updated per the patient's request.

## 2023-12-01 DIAGNOSIS — R1310 Dysphagia, unspecified: Principal | ICD-10-CM

## 2023-12-01 DIAGNOSIS — D89811 Chronic graft-versus-host disease: Principal | ICD-10-CM

## 2023-12-01 DIAGNOSIS — J984 Other disorders of lung: Principal | ICD-10-CM

## 2023-12-01 DIAGNOSIS — Z9481 Bone marrow transplant status: Principal | ICD-10-CM

## 2023-12-01 MED ORDER — TRELEGY ELLIPTA 200 MCG-62.5 MCG-25 MCG POWDER FOR INHALATION
Freq: Every day | RESPIRATORY_TRACT | 6 refills | 60.00000 days | Status: CP
Start: 2023-12-01 — End: 2024-11-30

## 2023-12-04 ENCOUNTER — Ambulatory Visit: Admit: 2023-12-04 | Discharge: 2023-12-05 | Payer: PRIVATE HEALTH INSURANCE

## 2023-12-04 ENCOUNTER — Inpatient Hospital Stay: Admit: 2023-12-04 | Discharge: 2023-12-05 | Payer: PRIVATE HEALTH INSURANCE

## 2023-12-08 DIAGNOSIS — C8190 Hodgkin lymphoma, unspecified, unspecified site: Principal | ICD-10-CM

## 2023-12-14 DIAGNOSIS — C8190 Hodgkin lymphoma, unspecified, unspecified site: Principal | ICD-10-CM

## 2023-12-15 DIAGNOSIS — C8190 Hodgkin lymphoma, unspecified, unspecified site: Principal | ICD-10-CM

## 2023-12-15 NOTE — Progress Notes (Signed)
 [facility] Refill Coordination Note    Specialty Medication(s) to be Shipped:   Hematology/Oncology: Rezurock     Other medication(s) to be shipped: No additional medications requested for fill at this time    Specialty Medications not needed at this time: N/A     KOHLTON Mathis, DOB: December 19, 1965  Phone: There are no phone numbers on file.      All above HIPAA information was verified with patient.     Was a nurse, learning disability used for this call? No    Completed refill call assessment today to schedule patient's medication shipment from the [facility]  [phone]).  All relevant notes have been reviewed.     Specialty medication(s) and dose(s) confirmed: Regimen is correct and unchanged.   Changes to medications: Roemello reports no changes at this time.  Changes to insurance: No  New side effects reported not previously addressed with a pharmacist or physician: None reported  Questions for the pharmacist: No    Confirmed patient received a Conservation Officer, Historic Buildings and a Surveyor, Mining with first shipment. The patient will receive a drug information handout for each medication shipped and additional FDA Medication Guides as required.       DISEASE/MEDICATION-SPECIFIC INFORMATION        N/A    SPECIALTY MEDICATION ADHERENCE     Medication Adherence    Patient reported X missed doses in the last month: 0  Specialty Medication: Rezurock  200mg               Were doses missed due to medication being on hold? No    Rezurock  200mg   : 5 days of medicine on hand       REFERRAL TO PHARMACIST     Referral to the pharmacist: Not needed      [facility]     Shipping address confirmed in Epic.     Cost and Payment: Patient has a $0 copay, payment information is not required.    Delivery Scheduled: Yes, Expected medication delivery date: 12/4.     Medication will be delivered via UPS to the prescription address in Epic WAM.    Zachary Mathis   [facility]  Specialty Technician

## 2023-12-17 MED FILL — REZUROCK 200 MG TABLET: ORAL | 30 days supply | Qty: 30 | Fill #1

## 2023-12-18 DIAGNOSIS — C8190 Hodgkin lymphoma, unspecified, unspecified site: Principal | ICD-10-CM

## 2023-12-18 DIAGNOSIS — Z9481 Bone marrow transplant status: Principal | ICD-10-CM

## 2023-12-18 DIAGNOSIS — D89811 Chronic graft-versus-host disease: Principal | ICD-10-CM

## 2023-12-18 NOTE — Telephone Encounter (Signed)
 Per Mr. Pilson' request, referral placed to [facility] for establishing care for chronic GVH as [facility] is out of network for patient, he verbalized understanding

## 2023-12-22 DIAGNOSIS — J849 Interstitial pulmonary disease, unspecified: Principal | ICD-10-CM

## 2023-12-22 NOTE — Telephone Encounter (Signed)
 Pt is OON now with [facility]. Per patient request, referral sent to [facility] clinic.

## 2023-12-29 DIAGNOSIS — C8190 Hodgkin lymphoma, unspecified, unspecified site: Principal | ICD-10-CM

## 2024-01-17 DIAGNOSIS — D89811 Chronic graft-versus-host disease: Principal | ICD-10-CM

## 2024-01-17 DIAGNOSIS — Z9481 Bone marrow transplant status: Principal | ICD-10-CM

## 2024-01-18 ENCOUNTER — Ambulatory Visit
Admit: 2024-01-18 | Discharge: 2024-01-19 | Payer: PRIVATE HEALTH INSURANCE | Attending: Hematology & Oncology | Primary: Hematology & Oncology

## 2024-01-18 ENCOUNTER — Other Ambulatory Visit: Admit: 2024-01-18 | Discharge: 2024-01-19 | Payer: PRIVATE HEALTH INSURANCE

## 2024-01-18 DIAGNOSIS — C8190 Hodgkin lymphoma, unspecified, unspecified site: Principal | ICD-10-CM

## 2024-01-18 DIAGNOSIS — K219 Gastro-esophageal reflux disease without esophagitis: Principal | ICD-10-CM

## 2024-01-18 DIAGNOSIS — E119 Type 2 diabetes mellitus without complications: Principal | ICD-10-CM

## 2024-01-18 DIAGNOSIS — K769 Liver disease, unspecified: Principal | ICD-10-CM

## 2024-01-18 DIAGNOSIS — Z9484 Stem cells transplant status: Principal | ICD-10-CM

## 2024-01-18 DIAGNOSIS — D89811 Chronic graft-versus-host disease: Principal | ICD-10-CM

## 2024-01-18 DIAGNOSIS — N1831 Stage 3a chronic kidney disease (CMS-HCC): Principal | ICD-10-CM

## 2024-01-18 DIAGNOSIS — C8120 Mixed cellularity classical Hodgkin lymphoma, unspecified site: Principal | ICD-10-CM

## 2024-01-18 DIAGNOSIS — Z9481 Bone marrow transplant status: Principal | ICD-10-CM

## 2024-01-18 DIAGNOSIS — Z794 Long term (current) use of insulin: Principal | ICD-10-CM

## 2024-01-18 DIAGNOSIS — R252 Cramp and spasm: Principal | ICD-10-CM

## 2024-01-18 DIAGNOSIS — D849 Immunodeficiency, unspecified: Principal | ICD-10-CM

## 2024-01-18 LAB — COMPREHENSIVE METABOLIC PANEL
ALBUMIN: 3.8 g/dL (ref 3.4–5.0)
ALKALINE PHOSPHATASE: 290 U/L — ABNORMAL HIGH (ref 46–116)
ALT (SGPT): 131 U/L — ABNORMAL HIGH (ref 10–49)
ANION GAP: 14 mmol/L (ref 5–14)
AST (SGOT): 117 U/L — ABNORMAL HIGH (ref <=34)
BILIRUBIN TOTAL: 0.6 mg/dL (ref 0.3–1.2)
BLOOD UREA NITROGEN: 13 mg/dL (ref 9–23)
BUN / CREAT RATIO: 9
CALCIUM: 9.5 mg/dL (ref 8.7–10.4)
CHLORIDE: 98 mmol/L (ref 98–107)
CO2: 30 mmol/L (ref 20.0–31.0)
CREATININE: 1.39 mg/dL — ABNORMAL HIGH (ref 0.73–1.18)
EGFR CKD-EPI (2021) MALE: 59 mL/min/1.73m2 — ABNORMAL LOW (ref >=60)
GLUCOSE RANDOM: 162 mg/dL (ref 70–179)
POTASSIUM: 3.8 mmol/L (ref 3.4–4.8)
PROTEIN TOTAL: 7.3 g/dL (ref 5.7–8.2)
SODIUM: 142 mmol/L (ref 135–145)

## 2024-01-18 LAB — CBC W/ AUTO DIFF
BASOPHILS ABSOLUTE COUNT: 0 10*9/L (ref 0.0–0.1)
BASOPHILS RELATIVE PERCENT: 0.5 %
EOSINOPHILS ABSOLUTE COUNT: 0.2 10*9/L (ref 0.0–0.5)
EOSINOPHILS RELATIVE PERCENT: 2.1 %
HEMATOCRIT: 47.9 % (ref 39.0–48.0)
HEMOGLOBIN: 16.4 g/dL (ref 12.9–16.5)
LYMPHOCYTES ABSOLUTE COUNT: 1.9 10*9/L (ref 1.1–3.6)
LYMPHOCYTES RELATIVE PERCENT: 21.6 %
MEAN CORPUSCULAR HEMOGLOBIN CONC: 34.3 g/dL (ref 32.0–36.0)
MEAN CORPUSCULAR HEMOGLOBIN: 35 pg — ABNORMAL HIGH (ref 25.9–32.4)
MEAN CORPUSCULAR VOLUME: 102.2 fL — ABNORMAL HIGH (ref 77.6–95.7)
MEAN PLATELET VOLUME: 8.5 fL (ref 6.8–10.7)
MONOCYTES ABSOLUTE COUNT: 0.7 10*9/L (ref 0.3–0.8)
MONOCYTES RELATIVE PERCENT: 7.6 %
NEUTROPHILS ABSOLUTE COUNT: 6.1 10*9/L (ref 1.8–7.8)
NEUTROPHILS RELATIVE PERCENT: 68.2 %
PLATELET COUNT: 265 10*9/L (ref 150–450)
RED BLOOD CELL COUNT: 4.69 10*12/L (ref 4.26–5.60)
RED CELL DISTRIBUTION WIDTH: 14.1 % (ref 12.2–15.2)
WBC ADJUSTED: 9 10*9/L (ref 3.6–11.2)

## 2024-01-18 LAB — TACROLIMUS LEVEL, TROUGH: TACROLIMUS, TROUGH: 1.3 ng/mL — ABNORMAL LOW (ref 5.0–15.0)

## 2024-01-18 LAB — BILIRUBIN, DIRECT: BILIRUBIN DIRECT: 0.2 mg/dL (ref 0.00–0.30)

## 2024-01-18 NOTE — Progress Notes (Signed)
 BMTCT Routine Follow-up Clinic Note:    Patient Name: Zachary Mathis  Medical Record Number:  [id number]  Encounter Date: 01/18/2024    BMT Attending MD: Odis Specking, MD    Allogeneic SCT: 08/29/15 [10/10, CMV+, ABO matched, male]  CAR T cell infusion [[id number]]: 08/04/14  Autologous SCT: 07/18/14    HPI:   Zachary Mathis is a 59 y.o. male with a diagnosis of Hodgkins Lymphoma.  Renold now presents for routine follow-up post allogeneic stem cell transplant. His course has been complicated by chronic GvHD involving the mucous membranes, eyes, and now lung.     He was last hospitalized after transfer from OSH [7/7 - 07/23/17] with DKA and an acute drop in hgb which were thought to be r/t GI bleeding. During his admission, he had colonoscopy and EGD which did not reveal an actively bleeding sites but revealed multiple non bleeding duodenal ulcers. His pathology was not c/w GHVD or viral cytopathic effect.        He has been seen Dr. Leonor and Dr. Gwenn for persistent diarrhea, where workup revealed a low stool elastase and he was diagnosed with pancreatic insufficiency. This has now resolved with pancreatic enzyme supplementation. His still has loose stools intermittently for which he takes Imodium prn. He has also seen Dr. Drucie with Hepatology who has prescribed ursodiol  (Dr. Drucie has now left [facility] and he has not yet established care with a new Hepatologist). He is now followed in the BMT clinic to monitor his cGVHD jeni have been active for years, now also with pulmonary involvement], immune suppression levels, viral and immune monitoring, and chimerism monitoring.     For DM, he is continuing therapy with an insulin  pump + monitoring. Followed by his endocrinologist for this.       Interval History:   Mr. Zachary Mathis has had his endoscopy and colonoscopy - Endoscopy did not show any areas of inflammation and colonoscopy was significant for a tubular adenoma that was removed (no clear signs of active GvHD in the upper or lower GI tract). His intermittent difficulty swallowing persists but is better now that he is cutting his food into smaller pieces. He also has had his tacrolimus  titrated down to 2mg  qAM and 1mg  qPM (and is taking this correctly), with level improved. His cough may be a bit improved but is still present. Appreciate the assistance of Dr. Diana in helping to optimize his medical management of pulmonary cGvHD.  Lung exam is showing improvement in air movement (with no wheezing, rhonchi, or chest tightness) compared to last year, similar to last visit.  He does have two RLL nodules (~1.5cm, ~0.5cm) seen on his last CT scan, for which he is scheduled for PET/CT for further evaluation on 1/6. No new respiratory symptoms or change in respiratory status.    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 Also the process of [facility] negotiating with [employer] which prompted him to be referred to [facility] (temporarily and without being seen) has also been very stressful.  Ocular cGvHD symtoms persist and are not substantially improved over prior with the use of autologous serum tears - However his symptoms also do not seem to be worsening. He is now using 100% autologous serum tears (up from  20%), and he has had his tear ducts cauterized which he reports has helped as well. He continues to use an indoor humidifier. He is able to work but occasionally limited by having to rest his eyes for a time to recover, also able to drive mostly but occasionally unable to drive as well, somewhat unchanged. Has to wear goggles when driving a convertible due to ocular sun sensitivity. He is using his serum drops that help temporarily but then his dryness returns after a few hours - Plans to continue these. His ophthalmologist is continuing to follow him for cataract care as well - He had right eye cataract removal surgery in December 2023, after which he recovered well and is doing well with vision 20/25 in his right eye.      Reflux symptoms had worsened and are stable from his last visit, worse with lying (and requiring him to sleep with pillows for support), without a change in diet (including no increase in spicy foods or alcohol). Also developed some persistent dysphagia, sometimes bad with pills and requiring him to cut his meat into small pieces, which prompted the endoscopy as above (no sign of GvHD). No oral, mucosal or skin symptoms, loss of joint ROM, and no other symptoms of cGvHD.  Appetite remains good and he is more active (unchanged from most recent visits).  No change to stooling.  No nausea.  His endocrinologist has switched him to a continuous insulin  pump which he is tolerating well and is controlling his blood glucose levels better.  He reports having some intermittent cramping in the muscles of his feet at night, that occasionally disrupts his sleep and that is relieved only by stretching.     EBV was detectable in the peripheral blood (< 100) on 09/12/19 -> ND since.      ROS:   PMH and PSH have been reviewed and there are no changes.      SocHx:  There are no pertinent changes to [facility].  The patient is residing at home and he continues to work. Has also been playing music with a group for recreation.     Hematology/Oncology History   Hodgkin lymphoma, mixed cellularity (CMS-HCC)   12/13/2012 Initial Diagnosis    Hodgkin lymphoma, mixed cellularity (RAF-HCC).  Stage at initial diagnosis: IIIB (bone marrow negative, ESR > 50, LDH 297, alb < 4.0, age 59)     12/13/2012 - 08/12/2013 Chemotherapy    6 cycles of ABVD. 1. Small sub-cm lung nodules on pre-treatment CT, not thought to represent lymphoma; 2. Pulmonary fibrosis with ABVD     12/31/2012 Biopsy    Lymph node, left supraclavicular, excision - Classical Hodgkin lymphoma, mixed cellularity subtype     01/25/2013 Biopsy    Normocellular bone marrow (60%) with trilineage hematopoiesis; negative for lymphoma     12/13/2013 -  Other    Patient discovered a palpable L supraclavicular node     01/26/2014 -  Cancer Staged    Deauville 5: Avid lymphoma in cervical, left axillary, mediastinal, left hilar, and periaortic nodes.      01/26/2014 Progression    PET/CT interpreted as lymphoma progression in the neck, chest, and abdomen/pelvis (along with hepatic steatosis and coronary artery calcifications).       02/13/2014 - 05/15/2014 Chemotherapy    Started ICE therapy in February with last cycle in May 2016. Received 4 cycles.      06/05/2014 Biopsy    Mildly hypercellular bone marrow (60%) with trilineage hematopoiesis.  No morphologic evidence of classical Hodgkin lymphoma     06/06/2014 -  Cancer Staged    PET/CT: Response to therapy of hypermetabolic adenopathy since 04/07/2016CT and 01/26/2014 PET. No residual malignant range activityidentified.2. No new sites of disease.3. Hepatic steatosis.4. Age advanced coronary artery atherosclerosis.      06/22/2014 - 06/23/2014 Chemotherapy    Stem cell mobilization with etoposide.     07/03/2014 -  Other    Stem cell collection.  CD34+DOSECOLLECT: 6.80      07/07/2014 -  Cancer Staged    CT scan: IMPRESSION: 1. Slightly decreased size of axillary, mediastinal, and retroperitoneal lymphadenopathy. 2. Hepatic steatosis.      07/18/2014 Bone Marrow Transplant    Autologus stem cell transplant     08/04/2014 Bone Marrow Transplant    CART infusion     01/30/2015 Progression    Mediastinal Relapse     02/21/2015 - 04/12/2015 Chemotherapy    3 cycles of bendamustine + brentuximab       Allergies:  Patient has no known allergies.     Medications:    Current Outpatient Medications on File Prior to Visit   Medication Sig    acyclovir  (ZOVIRAX ) 400 MG tablet Take 1 tablet (400 mg total) by mouth two (2) times a day.    azelastine-fluticasone  137-50 mcg/spray Spry 1 spray into each nostril Two (2) times a day.    azithromycin  (ZITHROMAX ) 250 MG tablet Take 1 tablet (250 mg total) by mouth daily. Take one 250mg  tablet on Mondays, Wednesdays, and Fridays (as part of FAM therapy)    belumosudil  (REZUROCK ) 200 mg Tab Take 1 tablet (200 mg) by mouth daily.    benzonatate  (TESSALON  PERLES) 100 MG capsule Take 1 capsule (100 mg total) by mouth Three (3) times a day as needed for cough.    calcium carbonate (TUMS) 200 mg calcium (500 mg) chewable tablet Chew 1 tablet (500 mg total) in the morning.    calcium carbonate-vitamin D3 600 mg (1,500 mg)-800 unit Chew Chew 1 tablet Two (2) times a day.    carboxymethylcellulose sodium (REFRESH CELLUVISC) 1 % DpGe 1 drop every four (4) hours as needed.    esomeprazole  (NEXIUM ) 40 MG capsule Take 1 capsule (40 mg total) by mouth daily before breakfast.    eszopiclone  (LUNESTA ) 2 MG Tab TAKE 1 TABLET BY MOUTH IMMEDIATELY BEFORE BEDTIME    fluorometholone  (FML) 0.1 % ophthalmic suspension Administer 1 drop to both eyes Three (3) times a day.    fluticasone -umeclidin-vilanter (TRELEGY ELLIPTA ) 200-62.5-25 mcg inhaler INHALE 1 PUFF DAILY    FREESTYLE LIBRE 14 DAY SENSOR kit USE AS DIRECTED EVERY 14 DAYS    levothyroxine  (SYNTHROID , LEVOTHROID) 300 MCG tablet Take 1 tablet (300 mcg total) by mouth in the morning.    metFORMIN  (GLUCOPHAGE ) 500 MG tablet Take 1 tablet (500 mg total) by mouth in the morning and 1 tablet (500 mg total) in the evening. Take with meals.    montelukast  (SINGULAIR ) 10 mg tablet Take 1 tablet (10 mg total) by mouth daily.    multivitamin therapeutic with minerals (THERA-M) 27-0.4 mg Tab Take 1 tablet by mouth in the morning.    neomycin -polymyxin-dexamethamethasone (MAXITROL) 3.5 mg/g-10,000 unit/g-0.1 % Oint Administer 1 Application to both eyes nightly.    NON FORMULARY Autologous serum tears , 100% concentration QID for 3 months with 3 refills    omega-3 acid ethyl esters (LOVAZA) 1 gram capsule Take 2 capsules (2 g total) by mouth Two (2) times a  day.    pancrelipase , Lip-Prot-Amyl, (CREON ) 24,000-76,000 -120,000 unit CpDR delayed release capsule Take 3 capsules by mouth 3 times a day with meals and 2 capsules 2 times a day with snacks    ruxolitinib  (JAKAFI ) 10 mg tablet Take 1 tablet (10 mg total) by mouth two (2) times a day.    sertraline  (ZOLOFT ) 100 MG tablet Take 1 tablet (100 mg total) by mouth daily.    sulfamethoxazole -trimethoprim  (BACTRIM  DS) 800-160 mg per tablet TAKE 1 TABLET (160 MG OF TRIMETHOPRIM  TOTAL) BY MOUTH EVERY MONDAY, WEDNESDAY, AND FRIDAY.    tacrolimus  (PROGRAF ) 1 MG capsule Take 2 capsules (2 mg total) by mouth daily AND 1 capsule (1 mg total) nightly.    TRESIBA  FLEXTOUCH U-100 100 unit/mL (3 mL) InPn INJECT 40 UNITS ONCE A DAY SUBCUTANEOUS    ursodioL  (ACTIGALL ) 300 mg capsule TAKE 1 CAPSULE BY MOUTH TWO TIMES A DAY.    varenicline  tartrate (TYRVAYA ) 0.03 mg/spray sprm 0.03 mg into each nostril two (2) times a day.     Current Facility-Administered Medications on File Prior to Visit   Medication    alteplase (ACTIVASE) 2 mg injection small catheter clearance    influenza vaccine quad (FLUARIX, FLULAVAL, FLUZONE) (6 MOS & UP) 2020-21 ADS Med    loteprednol (LOTEMAX) 0.5 % ophthalmic suspension 1 drop    pneumococcal polysacchride (23-valps) (PNEUMOVAX) 25 mcg/0.5 mL vaccine     Objective:  Vitals:    01/18/24 0945   BP: 110/69   Pulse: 81   Resp: 18   Temp: 36.1 ??C (96.9 ??F)   TempSrc: Temporal   SpO2: 94%   Weight: 81.7 kg (180 lb 1.9 oz)         KPS: 80%, Normal activity with effort; some signs or symptoms of disease (ECOG equivalent 1).     Chronic GVHD Assessment     Skin: BSA based features None (0), % BSA involved: No rash (0), non-BSA based features No sclerotic features (0), Other features:  NONE  Mouth: No symptoms (0) Lichen planus-like features present (Diagnostic) no  Eyes: Moderate dry eyes, partially affecting ADL, lubrication drops >3x per day/punctal plugs, WITHOUT new vision impairment (2) Ocular GvHD confirmed by opthalmologist yes  GI Tract: features None No symptoms (0)  Liver: Normal total bili and ALT/AP <3x ULN (0)  Lungs: symptom score Moderate symptoms with SOB after walking on flat ground (2), last FEV1 worsened since prior testing   Joints and Fascia: symptom score No symptoms (0) P-ROM shoulder 7, Elbow 7, Wrist/Fingers 7, Ankles 4      Genital tract: Not examined  Other features due to cGVHD: None    Overall cGVHD Score (current):  Moderate (eyes, lung)    Physical Exam:  General appearance: Middle aged WM, appears well, in no distress  Head: Normocephalic, without obvious abnormality  Eyes: +dryness, +mild erythema noted on surrounding conjunctiva on exam today (improved from his last exam), sclera white, non-incteric  Neck: no palpable adenopathy  Mouth: MMM, no ulcers, no mucoceles or lichenoid changes, several missing teeth/ plaque noted (oral exam improved from last year)  Lungs: Clear to auscultation bilaterally, no wheezing, good air movement and comfortable work of breathing  Heart: Regular rate and rhythm, S1, S2 normal, no murmur, no edema   Abdomen: soft, non-tender, normal BS  Extremities: No edema as above  Skin: No rashes  Neuro: No focal deficits     Lab Results   Component Value Date    WBC 9.0 01/18/2024  HGB 16.4 01/18/2024    HCT 47.9 01/18/2024    PLT 265 01/18/2024    NEUTROABS 6.1 01/18/2024    EOSABS 0.2 01/18/2024     Lab Results   Component Value Date    NA 142 01/18/2024    K 3.8 01/18/2024    CL 98 01/18/2024    CO2 30.0 01/18/2024    BUN 13 01/18/2024    CREATININE 1.39 (H) 01/18/2024    GLU 162 01/18/2024    CALCIUM 9.5 01/18/2024    MG 1.7 06/29/2023     Lab Results   Component Value Date    BILITOT 0.6 01/18/2024    PROT 7.3 01/18/2024    ALBUMIN 3.8 01/18/2024    ALT 131 (H) 01/18/2024    AST 117 (H) 01/18/2024    ALKPHOS 290 (H) 01/18/2024    LDH 175 10/03/2019     Assessment/Plan:  Disease/BMT: Complicated transplant history including auto SCT (07/18/14), CAR-T infusion (CART-T LCCC 1524 trial; 08/04/14), and lastly allogeneic HSCT (at [facility]; 08/29/15) for relapsed HD. He presented back to [facility] to re-establish care with Dr. Jerrell on D+77 s/p MUD HSCT.     ** For complete transplant note and CAR-T infusion, see clinic note from 11/14/15.     HCT-CI: Unknown   Match: MUD (10/10, CMV+, ABO matched, male)  Conditioning: Pentostatin + 600 cGy TBI (RIC)  Reported Cell Dose: 3.93 x 10^6 CD34+ cells/kg.  Post-SCT BmBx Evaluations:  11/07/15: D+60 Post HSCT BmBx [at [facility]].      ** Per outside report: TLH, no e/o non-Hodgkin B-cell lymphoproliferative disease. No increase in CD34+ CD117+ myeloblasts.  11/27/15: D+90. Normocellular [50%] w/TLH and no e/o classical HL.     02/20/16: PET/CT w/ NED.  08/05/17: PET/CT w/NED. Deauville 1.  - No further surveillance scans - Will monitor based on clinical suspicion in the setting of symptoms      Chimerism:  - Full donor chimerism in blood and bone marrow since allo HSCT.     ** Will continue monitoring - Last checked at this visit (results pending)    Heme:  - Engrafted with normal Hgb and platelets, very mild leukocytosis intermittently improved.      GvHD:   Prophylaxis:  - MTX/Tac/. Siro stopped early due to AKI.    Tacrolimus :   - Stopped tacrolimus  11/05/16.    - Re-started 09/03/17 w/worsening liver GvHD [biopsy proven] and ocular cGVHD, now with lung GvHD as well (09/2022).   - Goal level 5-10. CTM. Current dose 2mg  qAM and 1mg  qPM    Ocular GvHD: Chronic and dry eyes are painful.      - Followed by Dr. Dineen at [facility] in Ophthalmology who placed dissolvable plugs and prescribed a regimen of artificial tears and po omega-3 FAs, now with Dr. Sable  -> symptoms improved in AM after fluorometholone  added  -  Uses lubricating eye drops in addition to serum drops (100% autologous serum tears) and ointment at night.  Also on systemic therapy with Jakafi  + Belumosudil   + Tac.  -  Continuing to follow with Ophthalmology as well       GI/Hepatic:   07/23/17: Started 1mg /kg oral prednisone  for transaminitis.   08/18/17: Liver biopsy results:    ** Histologic findings most c/w chronic graft-versus-host disease. Moderate portal/periportal fibrosis with widespread ductopenia involving 13/15 portal tracts on H&E stained section. Moderate to severe perisinusoidal fibrosis, irregularly distributed, but predominantly centrizonal.   08/20/17: Added Jakafi  due to persistent  dry eye and rising transaminases in light of liver bx results - steroid refractory by definition.      ** AST peaked at 416 [08/26/17]    ** ALT peaked at 369 [08/26/17]     ** Tbili was 1.5; Alk PO4 793   09/03/17: Liver fx improved. Added back tacrolimus .  09/08/17: Taper prednisone  dose 35mg  po qd.   09/16/17: LFTs increased from last week. Increase Jakafi  to 10 mg bid [having significant SI hyperglycemia]. Tac goal 5-10.   09/30/17: LFTs improved, taper prednisone  to 25mg .    11/18/17: Taper pred to 5 mg.   07/19/19: Prednisone  now off.  Continuing jakafi  + tacrolimus . Tac goal 5-10 as above.  09/12/19: Transaminitis has flucuated, slightly up today.  Continues on Jakafi  10mg  BID + Tac.    10/03/19: Not sure if this worsening hepatic GvHD (as eyes have been stable and other symptoms resolved).   - Saw Dr. Drucie, who has prescribed ursodiol  (continuing) and will continue to follow. Continuing belumosudil  + jakafi  + tacrolimus  dosed per levels. Elevated transaminases at low levels thought to be 2/2 FLD at this point - Continuing to monitor.  - Worsening reflux symptoms associated with dysphagia to large pills concerning for potential upper GI involvement of cGvHD - EGD unrevealing and symptoms are stable. CTM.    Pulmonary GvHD:  - 10/20/22: New cough since his 09/2022 visit: PFTs with worsening FEV1 c/w pulmonary GvHD. Appreciate assistance from Dr. Diana.  Has seen some symptomatic improvement with Trelegy (and tessalon  perls).  I have prescribed Belumosudil  200mg /d and this was started in November 2024. Continuing monteleukast as well.   - Systemic GvHD therapy at this point is tacrolimus  + jakafi  + belumosudil . LFTs mildly elevated. Dr. Diana had wanted to add QVAR as well, but we could not get insurance approval for this.    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 Hepatic vasculature remains patent with normal flow. Coarsened, echogenic hepatic parenchyma suggestive of chronic liver disease. Gallbladder is unremarkable. No biliary ductal dilatation.     ID:   - Antiviral: Prophy valtrex  500mg  qd  - PJP: Prophy Bactrim  DS qMWF    Viral Screening:  01/16/20: CMV/ EBV negative.     Immunizations:   Covid vaccine - 04/12/19 dose #1 and 05/02/19 dose # [[employer]].      GI: Abd pain - resolved.   - Endoscopy w/several , non-bleeding duodenal ulcers.   -  Pancreatic insufficiency w/foul smelling diarrhea much improved on Creon . Encouraged continued well-balanced intake (inc. Adequate protein) and exercise to try and build muscle mass. Weight stable.  - Worsening reflux symptoms associated with dysphagia to large pills concerning for potential upper GI involvement of cGvHD - Appreciate luminal GI consultation (no e/o upper GI GvHD on most recent endoscopy).  - Continue nexium  40mg  daily + gaviscon after meals.     FEN/Renal:  Chronic Renal Insufficiency:   - Baseline post-SCT 1.8-2.1, had been better at 1.2-1.5, but then up to ~1.7 around the time of a supratherapeutic Tac level, now back to ~1.5  Reinforced the importance of adequate hydration.    CV:   Hx hyperlipidemia:  - Following for this as well with his PCP & endocrinologist    Hx cardiomyopathy:  - Per Care Everywhere: hx chemotherapy induced cardiomyopathy.   - ECHO at [facility] w/EF 50-55%.    ** PET scan from [facility], found to  have age advanced coronary artery atherosclerosis.  - Was on ASA but stopped after GI bleed. HTN:  - Normotensive on amlodipine  5mg  qd.     Pulm:   - DLCOc: 61.2%. Hx pulmonary fibrosis following ABVD therapy.   - Pulmonary nodules resolved, repeat imaging scheduled.   - Lung GvHD as above  08/10/17: PET and CT negative.    Seasonal allergic rhinitis:   - Azelastine-fluticasone  spray bid and allegra prn.     Neuro/Pain:   - No issues.     Psych:   Hx depression:   - Mood is stable on Zoloft  100mg  qd.      Insomnia:   - Lunesta  prn.       Endocrine:   Hx hypothyroidism:   - Monitoring and medication adjustments per endocrinologist, Dr. Tommas.      Hx Uncontrolled DM II:  - Followed by Dr. Bella in [place] (Endocrinology).   - Worsened by steroids, now off.   - Had been uncontrolled for months, Prior HgbA1C 9.5  - We have tapered prednisone  off as above, Being managed by Dr. Tommas. Now on insulin  pump + monitoring    Osteopenia:  - seen on Dexa 04/2020, worst T-score 2.1     Musculoskeletal:   DEXA:  11/23/15: Mildly low bone density. Started calcium per protocol. Zometa given 11/23/15 (completed course].   07/08/17: Re-started Ca w/D BID while on steroids.   09/09/17: Repeat DEXA showed osteoporosis with worsened bone density in the spine, stable in the femur.   04/16/20: DEXA with osteopenia - Would like to give denosumab - was denied.   12/31/20: Ostepenia as above   - Could not get approval for denosumab, however he did receive zometa    Summary: 40yrs post 10/10 MUD allo HSCT for relapsed HL, currently fully donor with no active e/o disease, prior treatment with autoSCT and anti-CD30 CAR-Tc (July 2016). Now has diagnosis of pulmonary cGvHD (no biopsy done; appreciate assistance from Dr. Diana in pulmonology in making the clinical diagnosis and formulating a treatment plan). New RLL lung nodules (1.5cm, 0.5cm) seen on chest CT, with further evaluation by PET/CT scheduled for tomorrow. Ocular cGvHD persists and is stable -> Cont. jakafi  + Tacrolimus  + belumosudil  + autologous serum tears. Also continuing ursodiol  as Dr. Drucie has prescribed and will reconnect with Hepatology. Will continue to follow with [facility] - Agree with continuing current dose of autologous serum tears     I personally spent over 40 minutes face-to-face and non-face-to-face in the care of this patient, which includes all pre, intra, and post visit time on the date of service.

## 2024-01-19 ENCOUNTER — Inpatient Hospital Stay: Admit: 2024-01-19 | Discharge: 2024-01-20 | Payer: PRIVATE HEALTH INSURANCE

## 2024-01-19 DIAGNOSIS — C8190 Hodgkin lymphoma, unspecified, unspecified site: Principal | ICD-10-CM

## 2024-01-19 DIAGNOSIS — Z9481 Bone marrow transplant status: Principal | ICD-10-CM

## 2024-01-19 MED ORDER — TACROLIMUS 1 MG CAPSULE, IMMEDIATE-RELEASE
ORAL_CAPSULE | Freq: Two times a day (BID) | ORAL | 6 refills | 30.00000 days | Status: CP
Start: 2024-01-19 — End: 2025-01-18

## 2024-01-19 MED ADMIN — heparin, porcine (PF) 100 unit/mL injection 500 Units: 500 [IU] | INTRAVENOUS | @ 21:00:00 | Stop: 2024-01-19

## 2024-01-19 MED ADMIN — Fluorine F-18 FDG 4-40 mCi IV: 12.55 | INTRAVENOUS | @ 21:00:00 | Stop: 2024-01-19

## 2024-01-19 NOTE — Telephone Encounter (Signed)
 [facility]  Immunosuppressive Therapy Note    Mahari Strahm is a 59 y.o. 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 Mr. Echavarria is currently day +3472 (Autologous PBSC Unknown Phase - Planned 07/18/2014), 3065 (BMT Unknown Phase - Planned 08/29/2015).    Current dose: tacrolimus  2 mg in AM and 1 mg in PM (uses 1 mg capsules)     Goal tacrolimus  Level: 5-10 ng/mL    Resulted level: 1.3 drawn at 8:41 AM, no missed doses per patient report and he notes that he taking his morning doses around 10 AM and evening doses between 10 PM and midnight     Lab Results   Component Value Date/Time    TACROLIMUS  1.3 (L) 01/18/2024 08:41 AM    TACROLIMUS  6.5 10/19/2023 08:59 AM    TACROLIMUS  13.9 07/22/2023 08:16 AM    TACROLIMUS  7.7 07/08/2023 07:54 AM    TACROLIMUS  21.2 (HH) 06/29/2023 10:02 AM      Lab Results   Component Value Date/Time    CREATININE 1.39 (H) 01/18/2024 08:41 AM    CREATININE 1.53 (H) 10/19/2023 08:59 AM    CREATININE 1.68 (H) 07/22/2023 08:16 AM    CREATININE 1.90 (H) 07/08/2023 07:54 AM    CREATININE 1.71 (H) 06/29/2023 08:55 AM     Recommendation:   Tacrolimus  level is supratherapeutic today and patient denies any missed doses or recent medications changes  Scr improved at 1.39  Tbili WNL, LFTs remain overall stably elevated  Given above, plan to increase tacrolimus  dose to 2 mg PO twice daily  Will recheck labs locally on Thursday 1/15 at [facility] in [place] ([place] location); plan discussed with patient via phone and he verbalized understanding    We will continue to monitor levels. Patient will be followed for changes in renal and hepatic function, toxicity, and efficacy.     Elizebeth Has, PharmD, BCOP  Clinical Pharmacist Practitioner, BMTCT

## 2024-01-25 DIAGNOSIS — C8190 Hodgkin lymphoma, unspecified, unspecified site: Principal | ICD-10-CM

## 2024-01-25 NOTE — Progress Notes (Signed)
 [facility] Mathis has been contacted in regards to their refill of Rezurock . At this time, they have declined refill due to patient having 30 doses remaining. Refill assessment call date has been updated per the patient's request.

## 2024-01-28 DIAGNOSIS — D89811 Chronic graft-versus-host disease: Principal | ICD-10-CM

## 2024-01-28 MED ORDER — AZITHROMYCIN 250 MG TABLET
ORAL_TABLET | 9 refills | 0.00000 days
Start: 2024-01-28 — End: ?

## 2024-01-29 LAB — COMPREHENSIVE METABOLIC PANEL
ALBUMIN: 4.3 g/dL (ref 3.8–4.9)
ALKALINE PHOSPHATASE: 377 IU/L — ABNORMAL HIGH (ref 47–123)
ALT (SGPT): 146 IU/L — ABNORMAL HIGH (ref 0–44)
AST (SGOT): 176 IU/L — ABNORMAL HIGH (ref 0–40)
BILIRUBIN TOTAL (MG/DL) IN SER/PLAS: 0.6 mg/dL (ref 0.0–1.2)
BLOOD UREA NITROGEN: 15 mg/dL (ref 6–24)
BUN / CREAT RATIO: 10 (ref 9–20)
CALCIUM: 9.6 mg/dL (ref 8.7–10.2)
CHLORIDE: 98 mmol/L (ref 96–106)
CO2: 27 mmol/L (ref 20–29)
CREATININE: 1.56 mg/dL — ABNORMAL HIGH (ref 0.76–1.27)
EGFR: 51 mL/min/1.73 — ABNORMAL LOW
GLOBULIN, TOTAL: 2.6 g/dL (ref 1.5–4.5)
GLUCOSE: 96 mg/dL (ref 70–99)
POTASSIUM: 4.6 mmol/L (ref 3.5–5.2)
SODIUM: 141 mmol/L (ref 134–144)
TOTAL PROTEIN: 6.9 g/dL (ref 6.0–8.5)

## 2024-01-29 MED ORDER — AZITHROMYCIN 250 MG TABLET
ORAL_TABLET | ORAL | 9 refills | 0.00000 days | Status: CP
Start: 2024-01-29 — End: ?

## 2024-01-30 LAB — TACROLIMUS LEVEL: TACROLIMUS BLOOD: 11.6 ng/mL (ref 5.0–20.0)

## 2024-02-04 DIAGNOSIS — C8190 Hodgkin lymphoma, unspecified, unspecified site: Principal | ICD-10-CM

## 2024-02-04 DIAGNOSIS — Z9481 Bone marrow transplant status: Principal | ICD-10-CM

## 2024-02-04 MED ORDER — TACROLIMUS 1 MG CAPSULE, IMMEDIATE-RELEASE
ORAL_CAPSULE | ORAL | 6 refills | 30.00000 days | Status: CP
Start: 2024-02-04 — End: 2025-02-03

## 2024-02-04 NOTE — Telephone Encounter (Signed)
 [facility] Cellular Therapy Program  Immunosuppressive Therapy Note    Phat Dalton is a 59 y.o. 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 Mr. Visconti is currently day +3488 (Autologous PBSC Unknown Phase - Planned 07/18/2014), 3081 (BMT Unknown Phase - Planned 08/29/2015).    Current dose: tacrolimus  2 mg in AM and 1 mg in PM (uses 1 mg capsules)     Goal tacrolimus  Level: 5-10 ng/mL    Resulted level: 11.6 drawn at 7:37 AM, patient denies taking his AM dose prior to lab draw in clinic     Lab Results   Component Value Date/Time    TACROLIMUS  11.6 01/28/2024 07:37 AM    TACROLIMUS  1.3 (L) 01/18/2024 08:41 AM    TACROLIMUS  6.5 10/19/2023 08:59 AM    TACROLIMUS  13.9 07/22/2023 08:16 AM    TACROLIMUS  7.7 07/08/2023 07:54 AM    TACROLIMUS  21.2 (HH) 06/29/2023 10:02 AM      Lab Results   Component Value Date/Time    CREATININE 1.56 (H) 01/28/2024 07:37 AM    CREATININE 1.39 (H) 01/18/2024 08:41 AM    CREATININE 1.53 (H) 10/19/2023 08:59 AM    CREATININE 1.68 (H) 07/22/2023 08:16 AM    CREATININE 1.90 (H) 07/08/2023 07:54 AM     Recommendation:   Tacrolimus  level is supratherapeutic today likely in setting of worsening renal function   Scr increased to 1.56 today   Tbili WNL, LFTs trending up. Message sent to Dr Jerrell to keep him up to date   Given above, plan to decrease tacrolimus  dose to 2 mg in AM and 1 mg in PM  Will recheck labs locally on Monday 02/15/24 at [facility] in [place] ([place] location); plan discussed with patient via phone and he verbalized understanding    We will continue to monitor levels. Patient will be followed for changes in renal and hepatic function, toxicity, and efficacy.     Elizebeth Has, PharmD, BCOP  Clinical Pharmacist Practitioner, BMTCT
